# Patient Record
Sex: Male | Born: 1940
Health system: Southern US, Community
[De-identification: ages and names within clinical notes are randomized; demographics above are authoritative.]

## PROBLEM LIST (undated history)

## (undated) DIAGNOSIS — Z87442 Personal history of urinary calculi: Secondary | ICD-10-CM

## (undated) DIAGNOSIS — I2510 Atherosclerotic heart disease of native coronary artery without angina pectoris: Secondary | ICD-10-CM

## (undated) DIAGNOSIS — H269 Unspecified cataract: Secondary | ICD-10-CM

## (undated) DIAGNOSIS — I10 Essential (primary) hypertension: Secondary | ICD-10-CM

## (undated) DIAGNOSIS — R251 Tremor, unspecified: Secondary | ICD-10-CM

## (undated) DIAGNOSIS — F419 Anxiety disorder, unspecified: Secondary | ICD-10-CM

## (undated) DIAGNOSIS — Z950 Presence of cardiac pacemaker: Secondary | ICD-10-CM

## (undated) DIAGNOSIS — G8929 Other chronic pain: Secondary | ICD-10-CM

## (undated) DIAGNOSIS — G20 Parkinson's disease: Secondary | ICD-10-CM

## (undated) HISTORY — PX: OTHER SURGICAL HISTORY: SHX169

## (undated) HISTORY — DX: Parkinson's disease: G20

## (undated) HISTORY — DX: Tremor, unspecified: R25.1

## (undated) HISTORY — DX: Unspecified cataract: H26.9

## (undated) HISTORY — PX: APPENDECTOMY: SHX54

## (undated) HISTORY — PX: TONSILLECTOMY: SUR1361

---
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
## 1994-12-19 HISTORY — PX: OTHER SURGICAL HISTORY: SHX169

## 2005-01-27 ENCOUNTER — Ambulatory Visit: Payer: Self-pay | Admitting: Cardiology

## 2005-02-04 ENCOUNTER — Ambulatory Visit: Payer: Self-pay | Admitting: Cardiology

## 2005-02-07 ENCOUNTER — Ambulatory Visit: Payer: Self-pay

## 2005-12-19 HISTORY — PX: STENT PLACEMENT VASCULAR (ARMC HX): HXRAD1737

## 2008-08-29 ENCOUNTER — Encounter: Payer: Self-pay | Admitting: Family Medicine

## 2008-12-05 ENCOUNTER — Encounter: Payer: Self-pay | Admitting: Family Medicine

## 2009-04-01 ENCOUNTER — Encounter: Payer: Self-pay | Admitting: Family Medicine

## 2010-08-25 ENCOUNTER — Encounter: Payer: Self-pay | Admitting: Family Medicine

## 2010-09-01 ENCOUNTER — Encounter: Payer: Self-pay | Admitting: Family Medicine

## 2011-07-23 ENCOUNTER — Encounter: Payer: Self-pay | Admitting: Family Medicine

## 2012-03-16 ENCOUNTER — Encounter: Payer: Self-pay | Admitting: Family Medicine

## 2012-03-29 ENCOUNTER — Encounter: Payer: Self-pay | Admitting: Family Medicine

## 2013-02-11 ENCOUNTER — Encounter: Payer: Self-pay | Admitting: Family Medicine

## 2013-02-25 ENCOUNTER — Encounter: Payer: Self-pay | Admitting: Family Medicine

## 2013-10-07 ENCOUNTER — Encounter: Payer: Self-pay | Admitting: Family Medicine

## 2014-11-19 ENCOUNTER — Encounter: Payer: Self-pay | Admitting: Family Medicine

## 2014-11-25 ENCOUNTER — Encounter: Payer: Self-pay | Admitting: Family Medicine

## 2015-06-01 ENCOUNTER — Encounter: Payer: Self-pay | Admitting: Family Medicine

## 2016-11-18 HISTORY — PX: PACEMAKER INSERTION: SHX728

## 2017-01-13 DIAGNOSIS — R001 Bradycardia, unspecified: Secondary | ICD-10-CM | POA: Diagnosis not present

## 2017-01-13 DIAGNOSIS — I2510 Atherosclerotic heart disease of native coronary artery without angina pectoris: Secondary | ICD-10-CM | POA: Diagnosis not present

## 2017-01-13 DIAGNOSIS — I10 Essential (primary) hypertension: Secondary | ICD-10-CM | POA: Diagnosis not present

## 2017-01-13 DIAGNOSIS — E785 Hyperlipidemia, unspecified: Secondary | ICD-10-CM | POA: Diagnosis not present

## 2017-03-27 DIAGNOSIS — I495 Sick sinus syndrome: Secondary | ICD-10-CM | POA: Diagnosis not present

## 2017-04-06 DIAGNOSIS — M5441 Lumbago with sciatica, right side: Secondary | ICD-10-CM | POA: Diagnosis not present

## 2017-04-06 DIAGNOSIS — M9904 Segmental and somatic dysfunction of sacral region: Secondary | ICD-10-CM | POA: Diagnosis not present

## 2017-04-06 DIAGNOSIS — M9903 Segmental and somatic dysfunction of lumbar region: Secondary | ICD-10-CM | POA: Diagnosis not present

## 2017-04-06 DIAGNOSIS — M545 Low back pain: Secondary | ICD-10-CM | POA: Diagnosis not present

## 2017-04-13 DIAGNOSIS — M545 Low back pain: Secondary | ICD-10-CM | POA: Diagnosis not present

## 2017-04-13 DIAGNOSIS — M9904 Segmental and somatic dysfunction of sacral region: Secondary | ICD-10-CM | POA: Diagnosis not present

## 2017-04-13 DIAGNOSIS — M5441 Lumbago with sciatica, right side: Secondary | ICD-10-CM | POA: Diagnosis not present

## 2017-04-13 DIAGNOSIS — M9903 Segmental and somatic dysfunction of lumbar region: Secondary | ICD-10-CM | POA: Diagnosis not present

## 2017-04-17 DIAGNOSIS — M5441 Lumbago with sciatica, right side: Secondary | ICD-10-CM | POA: Diagnosis not present

## 2017-04-17 DIAGNOSIS — M545 Low back pain: Secondary | ICD-10-CM | POA: Diagnosis not present

## 2017-04-17 DIAGNOSIS — M9903 Segmental and somatic dysfunction of lumbar region: Secondary | ICD-10-CM | POA: Diagnosis not present

## 2017-04-17 DIAGNOSIS — M9904 Segmental and somatic dysfunction of sacral region: Secondary | ICD-10-CM | POA: Diagnosis not present

## 2017-04-24 DIAGNOSIS — M5441 Lumbago with sciatica, right side: Secondary | ICD-10-CM | POA: Diagnosis not present

## 2017-04-24 DIAGNOSIS — M9904 Segmental and somatic dysfunction of sacral region: Secondary | ICD-10-CM | POA: Diagnosis not present

## 2017-04-24 DIAGNOSIS — M9903 Segmental and somatic dysfunction of lumbar region: Secondary | ICD-10-CM | POA: Diagnosis not present

## 2017-04-24 DIAGNOSIS — M545 Low back pain: Secondary | ICD-10-CM | POA: Diagnosis not present

## 2017-05-08 DIAGNOSIS — M5441 Lumbago with sciatica, right side: Secondary | ICD-10-CM | POA: Diagnosis not present

## 2017-05-08 DIAGNOSIS — M9903 Segmental and somatic dysfunction of lumbar region: Secondary | ICD-10-CM | POA: Diagnosis not present

## 2017-05-08 DIAGNOSIS — M545 Low back pain: Secondary | ICD-10-CM | POA: Diagnosis not present

## 2017-05-08 DIAGNOSIS — M9904 Segmental and somatic dysfunction of sacral region: Secondary | ICD-10-CM | POA: Diagnosis not present

## 2017-05-10 DIAGNOSIS — E782 Mixed hyperlipidemia: Secondary | ICD-10-CM | POA: Diagnosis not present

## 2017-05-10 DIAGNOSIS — Z125 Encounter for screening for malignant neoplasm of prostate: Secondary | ICD-10-CM | POA: Diagnosis not present

## 2017-05-10 DIAGNOSIS — Z79899 Other long term (current) drug therapy: Secondary | ICD-10-CM | POA: Diagnosis not present

## 2017-05-16 DIAGNOSIS — M545 Low back pain: Secondary | ICD-10-CM | POA: Diagnosis not present

## 2017-05-16 DIAGNOSIS — M5441 Lumbago with sciatica, right side: Secondary | ICD-10-CM | POA: Diagnosis not present

## 2017-05-16 DIAGNOSIS — M9904 Segmental and somatic dysfunction of sacral region: Secondary | ICD-10-CM | POA: Diagnosis not present

## 2017-05-16 DIAGNOSIS — M9903 Segmental and somatic dysfunction of lumbar region: Secondary | ICD-10-CM | POA: Diagnosis not present

## 2017-05-18 DIAGNOSIS — Z0001 Encounter for general adult medical examination with abnormal findings: Secondary | ICD-10-CM | POA: Diagnosis not present

## 2017-05-18 DIAGNOSIS — F419 Anxiety disorder, unspecified: Secondary | ICD-10-CM | POA: Diagnosis not present

## 2017-05-18 DIAGNOSIS — E782 Mixed hyperlipidemia: Secondary | ICD-10-CM | POA: Diagnosis not present

## 2017-05-18 DIAGNOSIS — I10 Essential (primary) hypertension: Secondary | ICD-10-CM | POA: Diagnosis not present

## 2017-05-22 DIAGNOSIS — M25331 Other instability, right wrist: Secondary | ICD-10-CM | POA: Diagnosis not present

## 2017-05-22 DIAGNOSIS — I2510 Atherosclerotic heart disease of native coronary artery without angina pectoris: Secondary | ICD-10-CM | POA: Diagnosis not present

## 2017-05-22 DIAGNOSIS — I10 Essential (primary) hypertension: Secondary | ICD-10-CM | POA: Diagnosis not present

## 2017-05-22 DIAGNOSIS — R5383 Other fatigue: Secondary | ICD-10-CM | POA: Diagnosis not present

## 2017-05-22 DIAGNOSIS — M25531 Pain in right wrist: Secondary | ICD-10-CM | POA: Diagnosis not present

## 2017-05-22 DIAGNOSIS — R001 Bradycardia, unspecified: Secondary | ICD-10-CM | POA: Diagnosis not present

## 2017-05-22 DIAGNOSIS — E785 Hyperlipidemia, unspecified: Secondary | ICD-10-CM | POA: Diagnosis not present

## 2017-05-30 DIAGNOSIS — M5441 Lumbago with sciatica, right side: Secondary | ICD-10-CM | POA: Diagnosis not present

## 2017-05-30 DIAGNOSIS — M9903 Segmental and somatic dysfunction of lumbar region: Secondary | ICD-10-CM | POA: Diagnosis not present

## 2017-05-30 DIAGNOSIS — M545 Low back pain: Secondary | ICD-10-CM | POA: Diagnosis not present

## 2017-05-30 DIAGNOSIS — M9904 Segmental and somatic dysfunction of sacral region: Secondary | ICD-10-CM | POA: Diagnosis not present

## 2017-06-06 DIAGNOSIS — M545 Low back pain: Secondary | ICD-10-CM | POA: Diagnosis not present

## 2017-06-06 DIAGNOSIS — M5441 Lumbago with sciatica, right side: Secondary | ICD-10-CM | POA: Diagnosis not present

## 2017-06-06 DIAGNOSIS — M9903 Segmental and somatic dysfunction of lumbar region: Secondary | ICD-10-CM | POA: Diagnosis not present

## 2017-06-06 DIAGNOSIS — M9904 Segmental and somatic dysfunction of sacral region: Secondary | ICD-10-CM | POA: Diagnosis not present

## 2017-06-27 DIAGNOSIS — M545 Low back pain: Secondary | ICD-10-CM | POA: Diagnosis not present

## 2017-06-27 DIAGNOSIS — R251 Tremor, unspecified: Secondary | ICD-10-CM | POA: Diagnosis not present

## 2017-06-28 DIAGNOSIS — M47816 Spondylosis without myelopathy or radiculopathy, lumbar region: Secondary | ICD-10-CM | POA: Diagnosis not present

## 2017-06-28 DIAGNOSIS — M4316 Spondylolisthesis, lumbar region: Secondary | ICD-10-CM | POA: Diagnosis not present

## 2017-06-28 DIAGNOSIS — I495 Sick sinus syndrome: Secondary | ICD-10-CM | POA: Diagnosis not present

## 2017-06-28 DIAGNOSIS — M545 Low back pain: Secondary | ICD-10-CM | POA: Diagnosis not present

## 2017-07-03 DIAGNOSIS — M9904 Segmental and somatic dysfunction of sacral region: Secondary | ICD-10-CM | POA: Diagnosis not present

## 2017-07-03 DIAGNOSIS — M9903 Segmental and somatic dysfunction of lumbar region: Secondary | ICD-10-CM | POA: Diagnosis not present

## 2017-07-03 DIAGNOSIS — M5441 Lumbago with sciatica, right side: Secondary | ICD-10-CM | POA: Diagnosis not present

## 2017-07-03 DIAGNOSIS — M545 Low back pain: Secondary | ICD-10-CM | POA: Diagnosis not present

## 2017-07-14 DIAGNOSIS — H43393 Other vitreous opacities, bilateral: Secondary | ICD-10-CM | POA: Diagnosis not present

## 2017-07-14 DIAGNOSIS — H524 Presbyopia: Secondary | ICD-10-CM | POA: Diagnosis not present

## 2017-07-17 DIAGNOSIS — M545 Low back pain: Secondary | ICD-10-CM | POA: Diagnosis not present

## 2017-07-17 DIAGNOSIS — M2560 Stiffness of unspecified joint, not elsewhere classified: Secondary | ICD-10-CM | POA: Diagnosis not present

## 2017-07-19 DIAGNOSIS — M545 Low back pain: Secondary | ICD-10-CM | POA: Diagnosis not present

## 2017-07-19 DIAGNOSIS — M2560 Stiffness of unspecified joint, not elsewhere classified: Secondary | ICD-10-CM | POA: Diagnosis not present

## 2017-07-24 DIAGNOSIS — M545 Low back pain: Secondary | ICD-10-CM | POA: Diagnosis not present

## 2017-07-24 DIAGNOSIS — M2560 Stiffness of unspecified joint, not elsewhere classified: Secondary | ICD-10-CM | POA: Diagnosis not present

## 2017-07-25 DIAGNOSIS — R251 Tremor, unspecified: Secondary | ICD-10-CM | POA: Diagnosis not present

## 2017-07-26 DIAGNOSIS — M2560 Stiffness of unspecified joint, not elsewhere classified: Secondary | ICD-10-CM | POA: Diagnosis not present

## 2017-07-26 DIAGNOSIS — M545 Low back pain: Secondary | ICD-10-CM | POA: Diagnosis not present

## 2017-08-01 DIAGNOSIS — M545 Low back pain: Secondary | ICD-10-CM | POA: Diagnosis not present

## 2017-08-01 DIAGNOSIS — M2560 Stiffness of unspecified joint, not elsewhere classified: Secondary | ICD-10-CM | POA: Diagnosis not present

## 2017-08-03 DIAGNOSIS — M2560 Stiffness of unspecified joint, not elsewhere classified: Secondary | ICD-10-CM | POA: Diagnosis not present

## 2017-08-03 DIAGNOSIS — M545 Low back pain: Secondary | ICD-10-CM | POA: Diagnosis not present

## 2017-08-11 DIAGNOSIS — R54 Age-related physical debility: Secondary | ICD-10-CM | POA: Diagnosis not present

## 2017-08-11 DIAGNOSIS — G250 Essential tremor: Secondary | ICD-10-CM | POA: Diagnosis not present

## 2017-08-11 DIAGNOSIS — R251 Tremor, unspecified: Secondary | ICD-10-CM | POA: Diagnosis not present

## 2017-08-11 DIAGNOSIS — E669 Obesity, unspecified: Secondary | ICD-10-CM | POA: Diagnosis not present

## 2017-08-29 DIAGNOSIS — M9903 Segmental and somatic dysfunction of lumbar region: Secondary | ICD-10-CM | POA: Diagnosis not present

## 2017-08-29 DIAGNOSIS — M461 Sacroiliitis, not elsewhere classified: Secondary | ICD-10-CM | POA: Diagnosis not present

## 2017-08-29 DIAGNOSIS — M5441 Lumbago with sciatica, right side: Secondary | ICD-10-CM | POA: Diagnosis not present

## 2017-08-29 DIAGNOSIS — M9904 Segmental and somatic dysfunction of sacral region: Secondary | ICD-10-CM | POA: Diagnosis not present

## 2017-09-14 DIAGNOSIS — M2560 Stiffness of unspecified joint, not elsewhere classified: Secondary | ICD-10-CM | POA: Diagnosis not present

## 2017-09-14 DIAGNOSIS — M545 Low back pain: Secondary | ICD-10-CM | POA: Diagnosis not present

## 2017-09-18 DIAGNOSIS — G20 Parkinson's disease: Secondary | ICD-10-CM | POA: Diagnosis not present

## 2017-09-18 DIAGNOSIS — G250 Essential tremor: Secondary | ICD-10-CM | POA: Diagnosis not present

## 2017-09-18 DIAGNOSIS — R54 Age-related physical debility: Secondary | ICD-10-CM | POA: Diagnosis not present

## 2017-09-19 DIAGNOSIS — M2560 Stiffness of unspecified joint, not elsewhere classified: Secondary | ICD-10-CM | POA: Diagnosis not present

## 2017-09-19 DIAGNOSIS — M545 Low back pain: Secondary | ICD-10-CM | POA: Diagnosis not present

## 2017-09-21 DIAGNOSIS — M545 Low back pain: Secondary | ICD-10-CM | POA: Diagnosis not present

## 2017-09-21 DIAGNOSIS — M2560 Stiffness of unspecified joint, not elsewhere classified: Secondary | ICD-10-CM | POA: Diagnosis not present

## 2017-09-26 DIAGNOSIS — M545 Low back pain: Secondary | ICD-10-CM | POA: Diagnosis not present

## 2017-09-26 DIAGNOSIS — M2560 Stiffness of unspecified joint, not elsewhere classified: Secondary | ICD-10-CM | POA: Diagnosis not present

## 2017-09-27 DIAGNOSIS — I495 Sick sinus syndrome: Secondary | ICD-10-CM | POA: Diagnosis not present

## 2017-09-28 DIAGNOSIS — M545 Low back pain: Secondary | ICD-10-CM | POA: Diagnosis not present

## 2017-09-28 DIAGNOSIS — M2560 Stiffness of unspecified joint, not elsewhere classified: Secondary | ICD-10-CM | POA: Diagnosis not present

## 2017-10-03 DIAGNOSIS — M2560 Stiffness of unspecified joint, not elsewhere classified: Secondary | ICD-10-CM | POA: Diagnosis not present

## 2017-10-03 DIAGNOSIS — M545 Low back pain: Secondary | ICD-10-CM | POA: Diagnosis not present

## 2017-10-25 DIAGNOSIS — M461 Sacroiliitis, not elsewhere classified: Secondary | ICD-10-CM | POA: Diagnosis not present

## 2017-10-25 DIAGNOSIS — M9904 Segmental and somatic dysfunction of sacral region: Secondary | ICD-10-CM | POA: Diagnosis not present

## 2017-10-25 DIAGNOSIS — M5441 Lumbago with sciatica, right side: Secondary | ICD-10-CM | POA: Diagnosis not present

## 2017-10-25 DIAGNOSIS — M9903 Segmental and somatic dysfunction of lumbar region: Secondary | ICD-10-CM | POA: Diagnosis not present

## 2017-10-31 DIAGNOSIS — M5441 Lumbago with sciatica, right side: Secondary | ICD-10-CM | POA: Diagnosis not present

## 2017-10-31 DIAGNOSIS — M9903 Segmental and somatic dysfunction of lumbar region: Secondary | ICD-10-CM | POA: Diagnosis not present

## 2017-10-31 DIAGNOSIS — M461 Sacroiliitis, not elsewhere classified: Secondary | ICD-10-CM | POA: Diagnosis not present

## 2017-10-31 DIAGNOSIS — M9904 Segmental and somatic dysfunction of sacral region: Secondary | ICD-10-CM | POA: Diagnosis not present

## 2017-11-14 DIAGNOSIS — S20211A Contusion of right front wall of thorax, initial encounter: Secondary | ICD-10-CM | POA: Diagnosis not present

## 2017-11-14 DIAGNOSIS — I2510 Atherosclerotic heart disease of native coronary artery without angina pectoris: Secondary | ICD-10-CM | POA: Diagnosis not present

## 2017-11-14 DIAGNOSIS — Z950 Presence of cardiac pacemaker: Secondary | ICD-10-CM | POA: Diagnosis not present

## 2017-11-14 DIAGNOSIS — I10 Essential (primary) hypertension: Secondary | ICD-10-CM | POA: Diagnosis not present

## 2017-11-14 DIAGNOSIS — Z7982 Long term (current) use of aspirin: Secondary | ICD-10-CM | POA: Diagnosis not present

## 2017-11-14 DIAGNOSIS — Z9861 Coronary angioplasty status: Secondary | ICD-10-CM | POA: Diagnosis not present

## 2017-11-14 DIAGNOSIS — S299XXA Unspecified injury of thorax, initial encounter: Secondary | ICD-10-CM | POA: Diagnosis not present

## 2017-11-14 DIAGNOSIS — I252 Old myocardial infarction: Secondary | ICD-10-CM | POA: Diagnosis not present

## 2017-12-05 ENCOUNTER — Ambulatory Visit (INDEPENDENT_AMBULATORY_CARE_PROVIDER_SITE_OTHER): Payer: Medicare Other | Admitting: Family Medicine

## 2017-12-05 ENCOUNTER — Encounter: Payer: Self-pay | Admitting: Family Medicine

## 2017-12-05 ENCOUNTER — Telehealth: Payer: Self-pay | Admitting: Neurology

## 2017-12-05 VITALS — BP 137/89 | HR 60 | Temp 96.9°F | Ht 71.0 in | Wt 202.0 lb

## 2017-12-05 DIAGNOSIS — H269 Unspecified cataract: Secondary | ICD-10-CM | POA: Insufficient documentation

## 2017-12-05 DIAGNOSIS — H6123 Impacted cerumen, bilateral: Secondary | ICD-10-CM

## 2017-12-05 DIAGNOSIS — Z0000 Encounter for general adult medical examination without abnormal findings: Secondary | ICD-10-CM | POA: Diagnosis not present

## 2017-12-05 DIAGNOSIS — R251 Tremor, unspecified: Secondary | ICD-10-CM | POA: Diagnosis not present

## 2017-12-05 DIAGNOSIS — K449 Diaphragmatic hernia without obstruction or gangrene: Secondary | ICD-10-CM

## 2017-12-05 DIAGNOSIS — G20 Parkinson's disease: Secondary | ICD-10-CM | POA: Diagnosis not present

## 2017-12-05 DIAGNOSIS — R899 Unspecified abnormal finding in specimens from other organs, systems and tissues: Secondary | ICD-10-CM | POA: Diagnosis not present

## 2017-12-05 DIAGNOSIS — I2510 Atherosclerotic heart disease of native coronary artery without angina pectoris: Secondary | ICD-10-CM | POA: Diagnosis not present

## 2017-12-05 DIAGNOSIS — Z950 Presence of cardiac pacemaker: Secondary | ICD-10-CM | POA: Diagnosis not present

## 2017-12-05 DIAGNOSIS — G20C Parkinsonism, unspecified: Secondary | ICD-10-CM

## 2017-12-05 DIAGNOSIS — Z1211 Encounter for screening for malignant neoplasm of colon: Secondary | ICD-10-CM

## 2017-12-05 DIAGNOSIS — R6889 Other general symptoms and signs: Secondary | ICD-10-CM | POA: Diagnosis not present

## 2017-12-05 LAB — URINALYSIS, COMPLETE
BILIRUBIN UA: NEGATIVE
GLUCOSE, UA: NEGATIVE
Ketones, UA: NEGATIVE
LEUKOCYTES UA: NEGATIVE
Nitrite, UA: NEGATIVE
PH UA: 7 (ref 5.0–7.5)
PROTEIN UA: NEGATIVE
RBC, UA: NEGATIVE
Specific Gravity, UA: 1.02 (ref 1.005–1.030)
UUROB: 1 mg/dL (ref 0.2–1.0)

## 2017-12-05 LAB — MICROSCOPIC EXAMINATION
BACTERIA UA: NONE SEEN
Epithelial Cells (non renal): NONE SEEN /hpf (ref 0–10)
Renal Epithel, UA: NONE SEEN /hpf
WBC UA: NONE SEEN /HPF (ref 0–?)

## 2017-12-05 MED ORDER — CARBIDOPA-LEVODOPA 10-100 MG PO TABS: 2.0000 | ORAL_TABLET | Freq: Two times a day (BID) | ORAL | 1 refills | Status: DC

## 2017-12-05 MED ORDER — PRIMIDONE 50 MG PO TABS: 50.0000 mg | ORAL_TABLET | Freq: Every day | ORAL | 1 refills | Status: DC

## 2017-12-05 NOTE — Telephone Encounter (Signed)
I discussed Dr. Laurance Flatten, he has been referred here for evaluation of Parkinson's disease.  He has recently moved to this area from the [place], [place] area.

## 2017-12-05 NOTE — Patient Instructions (Signed)
We will make a referral to cardiology for follow-up in a patient who has had stent placement and has had a pacemaker We will also make an appointment with neurology to get established for treating what could be Parkinson's disease. We will get baseline blood work today We will asked that we get a release of records from his cardiologist and neurologist in [place]

## 2017-12-05 NOTE — Progress Notes (Signed)
Subjective:    Patient ID: Marcus Fuller, male    DOB: 01-21-1941, 76 y.o.   MRN: [id number]  HPI Patient here today to establish care. He recently moved back to [place] from [place].  The patient comes to the office today to reestablish care and that they moved back from [place] back to the community.  While he was away he did have cardiac events requiring stent and pacemaker placement and he wants to get set up with a cardiologist.  He also has developed a tremor and he is tried medicine for Parkinson's but this did not help and they are thinking it may just be a familial tremor.  He also has never had a colonoscopy.  There is no family history of colon cancer.  He recently had a younger brother that died of pancreatitis.  The patient currently denies any chest pain or shortness of breath.  He denies any trouble with swallowing heartburn indigestion nausea vomiting diarrhea blood in the stool or black tarry bowel movements.  He is passing his water without problems.  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  He is currently taking medicine for his Parkinson's and is not sure if this is helping a lot.  In [place] he was followed by a neurologist and a cardiologist.  Patient is currently taking Sinemet IR 10 100 twice daily and he is taking 2 tablets twice daily.    Patient Active Problem List   Diagnosis Date Noted  . Cataract    Outpatient Encounter Medications as of 12/05/2017  Medication Sig  . carbidopa-levodopa (SINEMET IR) 10-100 MG tablet Take 2 tablets by mouth 2 (two) times daily.  Marland Kitchen ALPRAZolam (XANAX) 0.25 MG tablet Take 0.25 mg by mouth 2 (two) times daily as needed for anxiety.  . primidone (MYSOLINE) 50 MG tablet Take by mouth at bedtime.   No facility-administered encounter medications on file as of 12/05/2017.       Review of Systems  Constitutional: Negative.     HENT: Negative.   Eyes: Negative.   Respiratory: Negative.   Cardiovascular: Negative.   Gastrointestinal: Negative.   Endocrine: Negative.   Genitourinary: Negative.   Musculoskeletal: Negative.   Skin: Negative.   Allergic/Immunologic: Negative.   Neurological: Negative.   Hematological: Negative.   Psychiatric/Behavioral: Negative.        Objective:   Physical Exam  Constitutional: He is oriented to person, place, and time. He appears well-developed and well-nourished.  The patient is pleasant and alert and has a noticeable bilateral tremor with the right being worse than the left upper extremity.  HENT:  Head: Normocephalic and atraumatic.  Nose: Nose normal.  Mouth/Throat: Oropharynx is clear and moist. No oropharyngeal exudate.  Bilateral ear cerumen  Eyes: Conjunctivae and EOM are normal. Pupils are equal, round, and reactive to light. Right eye exhibits no discharge. Left eye exhibits no discharge. No scleral icterus.  Neck: Normal range of motion. Neck supple. No thyromegaly present.  No bruits thyromegaly or anterior cervical adenopathy  Cardiovascular: Normal rate, normal heart sounds and intact distal pulses.  No murmur heard. Heart is slightly irregular at 72/min  Pulmonary/Chest: Effort normal and breath sounds normal. No respiratory distress. He has no wheezes. He has no rales. He exhibits no tenderness.  Clear anteriorly and posteriorly and no axillary adenopathy  Abdominal: Soft. Bowel sounds are normal. He exhibits no mass. There  is no tenderness. There is no rebound and no guarding.  No abdominal tenderness masses bruits or organ enlargement and no inguinal adenopathy  Musculoskeletal: Normal range of motion. He exhibits no edema.  Lymphadenopathy:    He has no cervical adenopathy.  Neurological: He is alert and oriented to person, place, and time. He has normal reflexes. No cranial nerve deficit.  Pill rolling tremor in both hands most likely Parkinson's.   Skin: Skin is warm and dry. No rash noted.  Psychiatric: He has a normal mood and affect. His behavior is normal. Judgment and thought content normal.  Nursing note and vitals reviewed.  BP 137/89 (BP Location: Left Arm)   Pulse 60   Temp (!) 96.9 F (36.1 C) (Oral)   Ht '5\' 11"'  (1.803 m)   Wt 202 lb (91.6 kg)   BMI 28.17 kg/m         Assessment & Plan:  1. Tremor -The patient will continue with his primidone and Sinemet since this is what he was put on by the neurologist in [place] and we will let the neurologist that we refer him to make any adjustments to this as he sees fit. - CBC with Differential/Platelet - Thyroid Panel With TSH - Vitamin B12 - Ambulatory referral to Neurology  2. Primary Parkinsonism ([place]) -Refer to neurology and stay on medicines that he has been taking until then - CBC with Differential/Platelet - Ambulatory referral to Neurology  3. ASCVD (arteriosclerotic cardiovascular disease) -Referral to cardiology for ASCVD and pacemaker monitoring - BMP8+EGFR - CBC with Differential/Platelet - Hepatic function panel - Lipid panel - Ambulatory referral to Cardiology  4. Pacemaker -Refer to cardiology for regular follow-up - CBC with Differential/Platelet - Ambulatory referral to Cardiology  5. Screen for colon cancer -Patient given FOBT to return -We will get lab work back in FOBT back and then possible referral for colonoscopy - Ambulatory referral to Gastroenterology  6. Health care maintenance - BMP8+EGFR - CBC with Differential/Platelet - Hepatic function panel - VITAMIN D 25 Hydroxy (Vit-D Deficiency, Fractures) - Lipid panel - Thyroid Panel With TSH - Vitamin B12 - Urinalysis, Complete - Ambulatory referral to Cardiology - Ambulatory referral to Neurology - Ambulatory referral to Gastroenterology  7.  Bilateral ear cerumen -Ear irrigation to remove wax  8.  Hiatal hernia  No orders of the defined types were placed in this  encounter.  Patient Instructions  We will make a referral to cardiology for follow-up in a patient who has had stent placement and has had a pacemaker We will also make an appointment with neurology to get established for treating what could be Parkinson's disease. We will get baseline blood work today We will asked that we get a release of records from his cardiologist and neurologist in [facility]  Arrie Senate MD

## 2017-12-05 NOTE — Addendum Note (Signed)
Addended by: Zannie Cove on: 12/05/2017 04:35 PM   Modules accepted: Orders

## 2017-12-06 LAB — LIPID PANEL
CHOL/HDL RATIO: 5.9 ratio — AB (ref 0.0–5.0)
Cholesterol, Total: 190 mg/dL (ref 100–199)
HDL: 32 mg/dL — AB (ref >39)
LDL Calculated: 118 mg/dL — ABNORMAL HIGH (ref 0–99)
Triglycerides: 201 mg/dL — ABNORMAL HIGH (ref 0–149)
VLDL CHOLESTEROL CAL: 40 mg/dL (ref 5–40)

## 2017-12-06 LAB — HEPATIC FUNCTION PANEL
ALK PHOS: 94 IU/L (ref 39–117)
ALT: 25 IU/L (ref 0–44)
AST: 21 IU/L (ref 0–40)
Albumin: 4.6 g/dL (ref 3.5–4.8)
BILIRUBIN TOTAL: 0.7 mg/dL (ref 0.0–1.2)
BILIRUBIN, DIRECT: 0.21 mg/dL (ref 0.00–0.40)
Total Protein: 6.9 g/dL (ref 6.0–8.5)

## 2017-12-06 LAB — CBC WITH DIFFERENTIAL/PLATELET
BASOS: 1 %
Basophils Absolute: 0 10*3/uL (ref 0.0–0.2)
EOS (ABSOLUTE): 0.2 10*3/uL (ref 0.0–0.4)
EOS: 2 %
HEMOGLOBIN: 16 g/dL (ref 13.0–17.7)
Hematocrit: 47.3 % (ref 37.5–51.0)
IMMATURE GRANS (ABS): 0 10*3/uL (ref 0.0–0.1)
IMMATURE GRANULOCYTES: 0 %
LYMPHS: 23 %
Lymphocytes Absolute: 2 10*3/uL (ref 0.7–3.1)
MCH: 28.9 pg (ref 26.6–33.0)
MCHC: 33.8 g/dL (ref 31.5–35.7)
MCV: 85 fL (ref 79–97)
MONOCYTES: 7 %
Monocytes Absolute: 0.6 10*3/uL (ref 0.1–0.9)
NEUTROS ABS: 5.6 10*3/uL (ref 1.4–7.0)
NEUTROS PCT: 67 %
PLATELETS: 197 10*3/uL (ref 150–379)
RBC: 5.54 x10E6/uL (ref 4.14–5.80)
RDW: 13.6 % (ref 12.3–15.4)
WBC: 8.4 10*3/uL (ref 3.4–10.8)

## 2017-12-06 LAB — THYROID PANEL WITH TSH
FREE THYROXINE INDEX: 2 (ref 1.2–4.9)
T3 Uptake Ratio: 26 % (ref 24–39)
T4 TOTAL: 7.5 ug/dL (ref 4.5–12.0)
TSH: 4.25 u[IU]/mL (ref 0.450–4.500)

## 2017-12-06 LAB — BMP8+EGFR
BUN/Creatinine Ratio: 14 (ref 10–24)
BUN: 14 mg/dL (ref 8–27)
CALCIUM: 10.3 mg/dL — AB (ref 8.6–10.2)
CO2: 24 mmol/L (ref 20–29)
CREATININE: 0.98 mg/dL (ref 0.76–1.27)
Chloride: 102 mmol/L (ref 96–106)
GFR, EST AFRICAN AMERICAN: 87 mL/min/{1.73_m2} (ref >59)
GFR, EST NON AFRICAN AMERICAN: 75 mL/min/{1.73_m2} (ref >59)
Glucose: 84 mg/dL (ref 65–99)
Potassium: 4.1 mmol/L (ref 3.5–5.2)
Sodium: 143 mmol/L (ref 134–144)

## 2017-12-06 LAB — VITAMIN D 25 HYDROXY (VIT D DEFICIENCY, FRACTURES): VIT D 25 HYDROXY: 20.4 ng/mL — AB (ref 30.0–100.0)

## 2017-12-06 LAB — VITAMIN B12: VITAMIN B 12: 571 pg/mL (ref 232–1245)

## 2017-12-06 MED ORDER — VITAMIN D (ERGOCALCIFEROL) 1.25 MG (50000 UNIT) PO CAPS: 50000.0000 [IU] | ORAL_CAPSULE | ORAL | 12 refills | Status: DC

## 2017-12-06 NOTE — Addendum Note (Signed)
Addended by: Nigel Berthold C on: 12/06/2017 11:48 AM   Modules accepted: Orders

## 2017-12-25 ENCOUNTER — Ambulatory Visit: Payer: Self-pay | Admitting: Internal Medicine

## 2017-12-27 DIAGNOSIS — I495 Sick sinus syndrome: Secondary | ICD-10-CM | POA: Diagnosis not present

## 2018-01-02 ENCOUNTER — Telehealth: Payer: Self-pay | Admitting: Family Medicine

## 2018-01-02 NOTE — Telephone Encounter (Signed)
appt made

## 2018-01-03 ENCOUNTER — Ambulatory Visit (INDEPENDENT_AMBULATORY_CARE_PROVIDER_SITE_OTHER): Payer: Medicare Other | Admitting: Family Medicine

## 2018-01-03 ENCOUNTER — Encounter: Payer: Self-pay | Admitting: Family Medicine

## 2018-01-03 ENCOUNTER — Ambulatory Visit (INDEPENDENT_AMBULATORY_CARE_PROVIDER_SITE_OTHER): Payer: Medicare Other

## 2018-01-03 VITALS — BP 171/92 | HR 58 | Temp 96.8°F | Ht 71.0 in | Wt 202.0 lb

## 2018-01-03 DIAGNOSIS — M25421 Effusion, right elbow: Secondary | ICD-10-CM | POA: Diagnosis not present

## 2018-01-03 DIAGNOSIS — I10 Essential (primary) hypertension: Secondary | ICD-10-CM | POA: Diagnosis not present

## 2018-01-03 IMAGING — DX DG ELBOW 2V*R*
2 series · 2 of 2 positions shown · non-contrast
Comparison: No prior.

CLINICAL DATA: Fall.  Continued pain.

EXAM:
RIGHT ELBOW - 2 VIEW

[elbow ap]
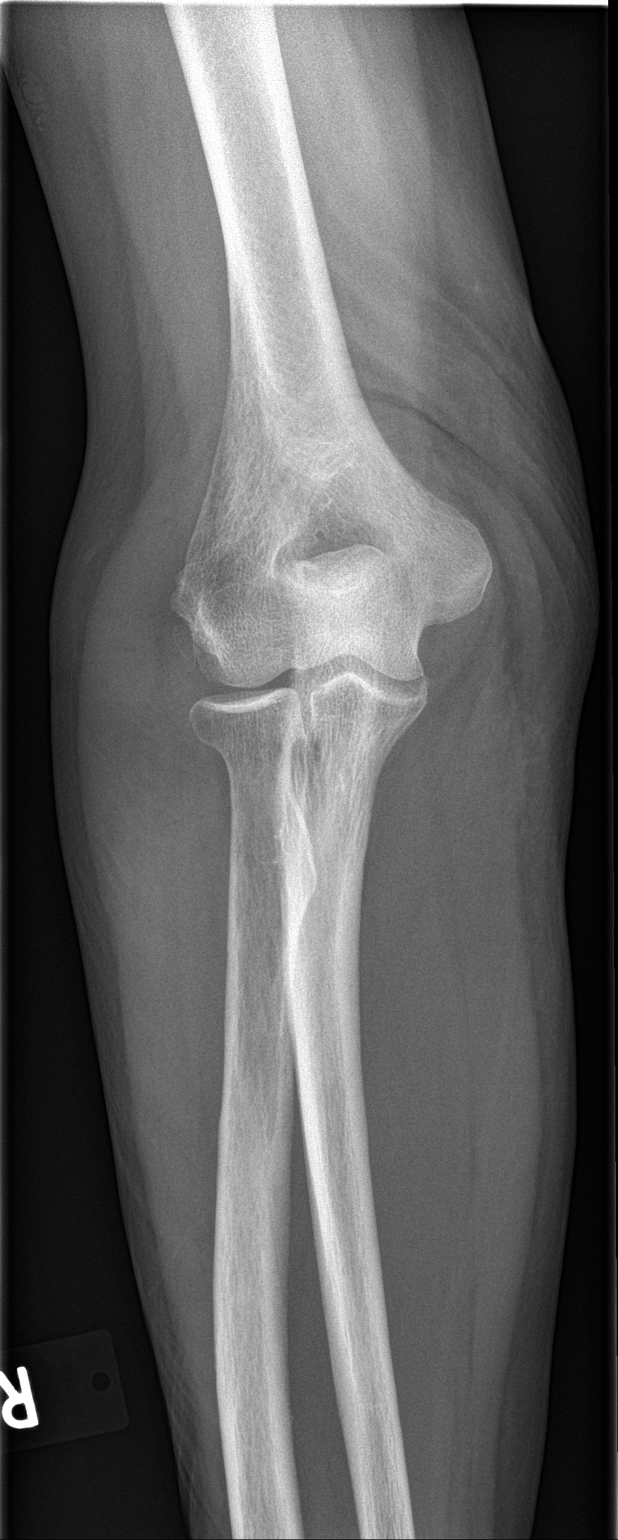

[elbow lat]
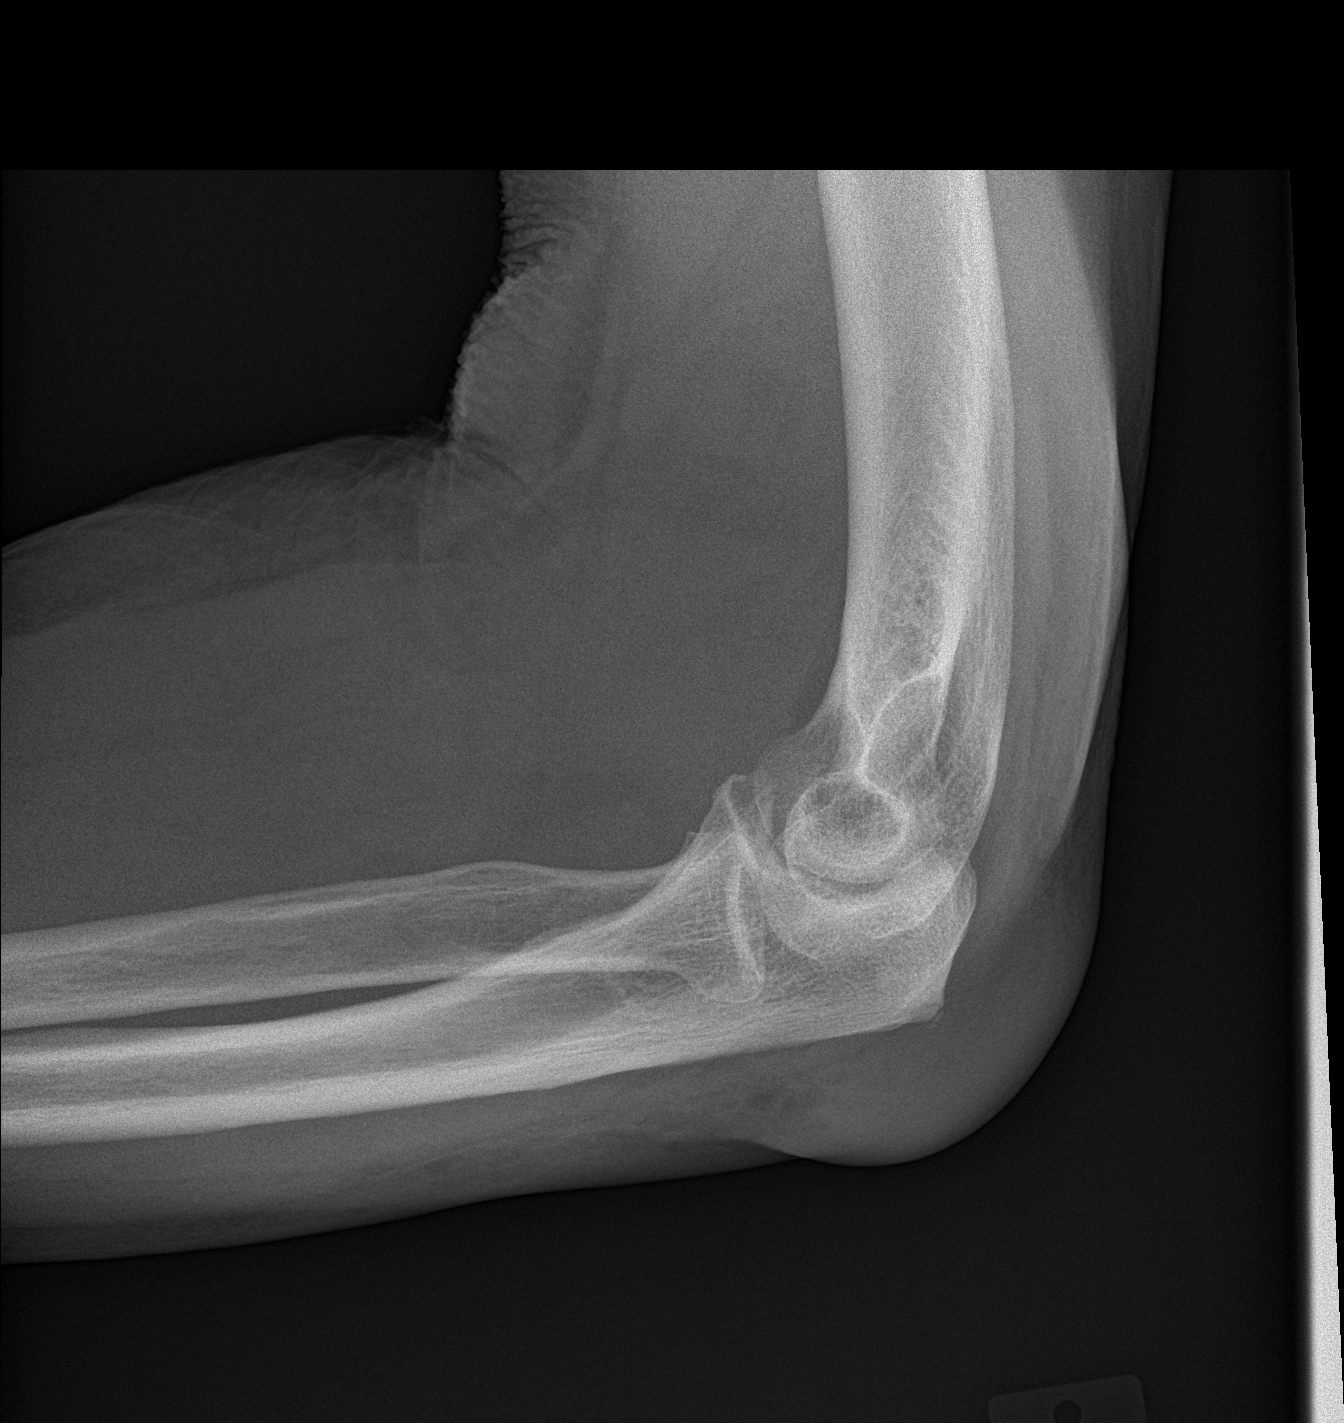

[2 of 2 positions shown; findings below may reference images not displayed]

FINDINGS: Diffuse soft tissue swelling. Elbow joint effusion cannot be
excluded. Corticated bony density noted adjacent to the radial
epicondyle distal humerus. This is most likely from old injury or
non unified secondary ossification center. No acute fracture noted.
Diffuse degenerative change.
IMPRESSION: 1. Diffuse soft tissue swelling. Elbow joint effusion cannot be
excluded. No acute bony abnormality identified.

2. Diffuse degenerative change. Corticated bony density noted
adjacent to the radial epicondyle of the distal humerus. This is
most likely from an old injury or non unified secondary ossification
center.

## 2018-01-03 NOTE — Progress Notes (Signed)
Subjective:    Patient ID: Marcus Fuller, male    DOB: 03/21/1941, 77 y.o.   MRN: [id number]  HPI Patient here today for right elbow fluid.  The patient fell several months ago and hit this right elbow.  The fluid that he is experiencing now has just occurred most recently.  He does not recall any recent injury.  Incidentally he still has upcoming appointments with the cardiologist and with a neurologist.  He did return his FOBT card today and he has never had a colonoscopy.  We will make a decision about that when the FOBT is returned.    Patient Active Problem List   Diagnosis Date Noted  . Cataract    Outpatient Encounter Medications as of 01/03/2018  Medication Sig  . carbidopa-levodopa (SINEMET IR) 10-100 MG tablet Take 2 tablets by mouth 2 (two) times daily.  . primidone (MYSOLINE) 50 MG tablet Take 1 tablet (50 mg total) by mouth at bedtime.  . Vitamin D, Ergocalciferol, (DRISDOL) 50000 units CAPS capsule Take 1 capsule (50,000 Units total) by mouth every 7 (seven) days.  . ALPRAZolam (XANAX) 0.25 MG tablet Take 0.25 mg by mouth 2 (two) times daily as needed for anxiety.   No facility-administered encounter medications on file as of 01/03/2018.       Review of Systems  Constitutional: Negative.   HENT: Negative.   Eyes: Negative.   Respiratory: Negative.   Cardiovascular: Negative.   Gastrointestinal: Negative.   Endocrine: Negative.   Genitourinary: Negative.   Musculoskeletal: Negative.        Right elbow fluid filled area   Skin: Negative.   Allergic/Immunologic: Negative.   Neurological: Negative.   Hematological: Negative.   Psychiatric/Behavioral: Negative.        Objective:   Physical Exam  Constitutional: He is oriented to person, place, and time. He appears well-developed and well-nourished. No distress.  HENT:  Head: Normocephalic.  Eyes: Conjunctivae and EOM are normal. Pupils are equal, round, and reactive to light. Right eye exhibits no  discharge. Left eye exhibits no discharge. No scleral icterus.  Neck: Normal range of motion.  Musculoskeletal: Normal range of motion. He exhibits edema and deformity. He exhibits no tenderness.  Good range of motion of right forearm.  Fluid retention over lateral elbow most likely traumatic hematoma.  Neurological: He is alert and oriented to person, place, and time.  Tremor right hand  Skin: Skin is warm and dry. No rash noted. No erythema.  Psychiatric: He has a normal mood and affect. His behavior is normal. Judgment and thought content normal.  Nursing note and vitals reviewed.   BP (!) 171/92 (BP Location: Left Arm)   Pulse (!) 58   Temp (!) 96.8 F (36 C) (Oral)   Ht 5\' 11"  (1.803 m)   Wt 202 lb (91.6 kg)   BMI 28.17 kg/m   The blood pressure is elevated today, the previous reading was 137/89. We will get another reading before he leaves the office.  The reading was still elevated in the 892 range systolic.    Assessment & Plan:  1. Swelling of right elbow -Reassured and watch - DG Elbow 2 Views Right; Future - Fecal occult blood, imunochemical  2. Essential hypertension -The patient's blood pressure was elevated on 2 occasions today.  He emphasizes to [name] that if he sits around for a while and rechecked at that the blood pressure comes back down to a normal range.  We will have him monitor  the blood pressure at home on a more regular basis and bring readings back to the office at his next visit.  He should also take copies of these readings with him when he goes to see the cardiologist. -He should watch his sodium intake.  Patient Instructions  Avoid overuse of the involved elbow We will call with the results of the x-ray as soon as those results become available Give time and this hematoma should resolve on its own   Arrie Senate MD

## 2018-01-03 NOTE — Patient Instructions (Addendum)
Avoid overuse of the involved elbow We will call with the results of the x-ray as soon as those results become available Give time and this hematoma should resolve on its own

## 2018-01-04 LAB — FECAL OCCULT BLOOD, IMMUNOCHEMICAL: Fecal Occult Bld: NEGATIVE

## 2018-01-08 ENCOUNTER — Encounter: Payer: Self-pay | Admitting: Family Medicine

## 2018-01-16 DIAGNOSIS — H2513 Age-related nuclear cataract, bilateral: Secondary | ICD-10-CM | POA: Diagnosis not present

## 2018-01-22 ENCOUNTER — Ambulatory Visit: Payer: Self-pay | Admitting: Cardiovascular Disease

## 2018-01-29 ENCOUNTER — Encounter: Payer: Self-pay | Admitting: Neurology

## 2018-01-29 ENCOUNTER — Other Ambulatory Visit: Payer: Self-pay

## 2018-01-29 ENCOUNTER — Ambulatory Visit (INDEPENDENT_AMBULATORY_CARE_PROVIDER_SITE_OTHER): Payer: Medicare Other | Admitting: Neurology

## 2018-01-29 ENCOUNTER — Telehealth: Payer: Self-pay | Admitting: Neurology

## 2018-01-29 DIAGNOSIS — G20 Parkinson's disease: Secondary | ICD-10-CM | POA: Diagnosis not present

## 2018-01-29 DIAGNOSIS — R29818 Other symptoms and signs involving the nervous system: Secondary | ICD-10-CM | POA: Diagnosis not present

## 2018-01-29 DIAGNOSIS — G20A1 Parkinson's disease without dyskinesia, without mention of fluctuations: Secondary | ICD-10-CM | POA: Insufficient documentation

## 2018-01-29 HISTORY — DX: Parkinson's disease: G20

## 2018-01-29 HISTORY — DX: Parkinson's disease without dyskinesia, without mention of fluctuations: G20.A1

## 2018-01-29 MED ORDER — BENZTROPINE MESYLATE 0.5 MG PO TABS: 0.5000 mg | ORAL_TABLET | Freq: Two times a day (BID) | ORAL | 3 refills | Status: DC

## 2018-01-29 NOTE — Patient Instructions (Signed)
   We will start Cogentin 0.5 mg twice a day.  We will get a CT of the brain.

## 2018-01-29 NOTE — Progress Notes (Signed)
Reason for visit: Tremor  Referring physician: Dr. [name] is a 77 y.o. male  History of present illness:  Marcus Fuller is a 77 year old right-handed white male with a history of a tremor that is affecting the right upper extremity that has been present for about 2 years.  The patient has moved from [place], [place] in December 2018.  He was treated by a neurologist in that area for an essential tremor and was placed on Mysoline.  The patient could not tolerate doses higher than 50 mg at night.  The patient was then placed on Sinemet for Parkinson's disease.  The tremor has persisted, the tremor affects only the right arm.  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  The patient has tremors mainly when he is resting his arm.  The patient has had micrographia with his handwriting.  The patient indicates that his brother also had a tremor before he passed away.  The patient denies any changes in speech with exception that when he gets very tired he may have a low amplitude speech.  The patient denies any problems with mobility, he denies issues getting up from a chair or walking.  He has not had any memory or concentration issues.  He denies issues controlling the bowels of the bladder, he has not had any numbness or weakness of the extremities.  The patient is sent to this office for an evaluation.  Past Medical History:  Diagnosis Date  . Cataract    beginning stages  . Parkinson's disease ([place]) 01/29/2018  . Tremor of right hand     Past Surgical History:  Procedure Laterality Date  . APPENDECTOMY    . bbb    . PACEMAKER INSERTION  11/2016  . STENT PLACEMENT VASCULAR ([facility]X)  2007  . TONSILLECTOMY      Family History  Problem Relation Age of Onset  . Heart disease Mother   . Diabetes Mother   . Cancer Mother        breast  . Heart  disease Father   . Heart attack Father   . Atrial fibrillation Sister   . Heart disease Brother   . Pancreatic disease Brother   . Tremor Brother   . Heart attack Paternal Grandfather   . Spina bifida Brother     Social history:  reports that  has never smoked. he has never used smokeless tobacco. He reports that he drinks alcohol. He reports that he does not use drugs.  Medications:  Prior to Admission medications   Medication Sig Start Date End Date Taking? Authorizing Provider  primidone (MYSOLINE) 50 MG tablet Take 1 tablet (50 mg total) by mouth at bedtime. 12/05/17  Yes Chipper Herb, MD  Vitamin D, Ergocalciferol, (DRISDOL) 50000 units CAPS capsule Take 1 capsule (50,000 Units total) by mouth every 7 (seven) days. 12/06/17  Yes Chipper Herb, MD  benztropine (COGENTIN) 0.5 MG tablet Take 1 tablet (0.5 mg total) by mouth 2 (two) times daily. 01/29/18   Kathrynn Ducking, MD      Allergies  Allergen Reactions  . Codeine     Unable to tolerate    ROS:  Out of a complete 14 system review of symptoms, the patient complains only of the following symptoms, and all other reviewed systems are negative.  Feeling  cold Joint pain, aching muscles Impotence Headache, dizziness, tremor Anxiety, decreased energy, disinterest in activities  Blood pressure (!) 148/90, pulse (!) 59, height 5\' 10"  (1.778 m), weight 206 lb 8 oz (93.7 kg).  Physical Exam  General: The patient is alert and cooperative at the time of the examination.  Eyes: Pupils are equal, round, and reactive to light. Discs are flat bilaterally.  Neck: The neck is supple, no carotid bruits are noted.  Respiratory: The respiratory examination is clear.  Cardiovascular: The cardiovascular examination reveals a regular rate and rhythm, no obvious murmurs or rubs are noted.  Skin: Extremities are without significant edema.  Neurologic Exam  Mental status: The patient is alert and oriented x 3 at the time of  the examination. The patient has apparent normal recent and remote memory, with an apparently normal attention span and concentration ability.  Cranial nerves: Facial symmetry is present. There is good sensation of the face to pinprick and soft touch bilaterally. The strength of the facial muscles and the muscles to head turning and shoulder shrug are normal bilaterally. Speech is well enunciated, no aphasia or dysarthria is noted. Extraocular movements are full. Visual fields are full. The tongue is midline, and the patient has symmetric elevation of the soft palate. No obvious hearing deficits are noted.  Motor: The motor testing reveals 5 over 5 strength of all 4 extremities. Good symmetric motor tone is noted throughout.  Sensory: Sensory testing is intact to pinprick, soft touch, vibration sensation, and position sense on all 4 extremities. No evidence of extinction is noted.  Coordination: Cerebellar testing reveals good finger-nose-finger and heel-to-shin bilaterally.  Gait and station: The patient is able to rise from a seated position with arms crossed.  Once up, the patient can walk independently, he has decreased arm swing on the right upper extremity with tremor seen with walking.  Tandem gait is very minimally unsteady. Romberg is negative. No drift is seen.  Reflexes: Deep tendon reflexes are symmetric and normal bilaterally. Toes are downgoing bilaterally.   Assessment/Plan:  1.  Right upper extremity tremor, probable Parkinson's disease  The patient appears to have features of parkinsonism.  The patient has a resting tremor involving the right upper extremity and decreased arm swing with walking.  The patient has good mobility, he has had a tremor for at least 2 years with minimal progression of other parkinsonian features.  The patient will be placed on Cogentin for the tremor taking 0.5 mg twice daily.  He will stop the Mysoline.  He will follow-up through this office in about  5-6 months.  A CT scan of the brain will be done.  Jill Alexanders MD 01/29/2018 9:09 AM  [facility] [address] [place], [place] [address]  Phone [phone] Fax [phone]

## 2018-01-29 NOTE — Telephone Encounter (Signed)
Jacqueline/[facility] [phone] called saidbenztropine (COGENTIN) 0.5 MG tablet  is on the beers list and she doesn't have any record that he taken it before. Please call to advise

## 2018-01-29 NOTE — Telephone Encounter (Signed)
I called the patient.  I had already discussed with the patient earlier the potential side effects of Cogentin being blurred vision, constipation, dry mouth, and the potential for confusion.  If the patient appears to have some change in cognitive functioning, he will need to stop the medication.  The patient has Parkinson's disease, Mysoline is not very effective for the tremor, Sinemet usually does not help Parkinson's tremors that much.  Anticholinergic medication such as Artane or Cogentin are more effective, the treatment that is the most effective is deep brain stimulation.

## 2018-01-31 NOTE — Telephone Encounter (Signed)
Called and spoke with Ankit. Advised Dr. Jannifer Franklin aware and has spoken with the patient. He verbalized understanding. Nothing further needed.

## 2018-01-31 NOTE — Telephone Encounter (Signed)
Marcus Fuller with [facility] is calling to discuss medication benztropine (COGENTIN) 0.5 MG tablet. He wants to be sure Dr. Jannifer Franklin knows this medication is on the [facility] list.

## 2018-02-08 ENCOUNTER — Ambulatory Visit (INDEPENDENT_AMBULATORY_CARE_PROVIDER_SITE_OTHER): Payer: Medicare Other | Admitting: Cardiovascular Disease

## 2018-02-08 ENCOUNTER — Ambulatory Visit: Payer: Self-pay | Admitting: Cardiovascular Disease

## 2018-02-08 ENCOUNTER — Encounter: Payer: Self-pay | Admitting: Cardiovascular Disease

## 2018-02-08 VITALS — BP 128/88 | HR 71 | Ht 70.0 in | Wt 206.0 lb

## 2018-02-08 DIAGNOSIS — I5022 Chronic systolic (congestive) heart failure: Secondary | ICD-10-CM

## 2018-02-08 DIAGNOSIS — I2510 Atherosclerotic heart disease of native coronary artery without angina pectoris: Secondary | ICD-10-CM | POA: Diagnosis not present

## 2018-02-08 DIAGNOSIS — R251 Tremor, unspecified: Secondary | ICD-10-CM

## 2018-02-08 DIAGNOSIS — E782 Mixed hyperlipidemia: Secondary | ICD-10-CM

## 2018-02-08 DIAGNOSIS — Z950 Presence of cardiac pacemaker: Secondary | ICD-10-CM | POA: Diagnosis not present

## 2018-02-08 NOTE — Patient Instructions (Signed)
Dr Sallyanne Kuster recommends that you continue on your current medications as directed. Please refer to the Current Medication list given to you today.  Your physician has requested that you have an echocardiogram. Echocardiography is a painless test that uses sound waves to create images of your heart. It provides your doctor with information about the size and shape of your heart and how well your heart's chambers and valves are working. This procedure takes approximately one hour. There are no restrictions for this procedure. >>This will be performed at our [facility] location: [address], [place] [address]  Your physician recommends that you schedule a follow-up appointment in 4-6 weeks in the [facility].  Dr Sallyanne Kuster recommends that you schedule a follow-up appointment in 3 months with a pacemaker check.  If you need a refill on your cardiac medications before your next appointment, please call your pharmacy.

## 2018-02-11 DIAGNOSIS — I2510 Atherosclerotic heart disease of native coronary artery without angina pectoris: Secondary | ICD-10-CM | POA: Insufficient documentation

## 2018-02-11 DIAGNOSIS — Z950 Presence of cardiac pacemaker: Secondary | ICD-10-CM | POA: Insufficient documentation

## 2018-02-11 DIAGNOSIS — E782 Mixed hyperlipidemia: Secondary | ICD-10-CM | POA: Insufficient documentation

## 2018-02-11 DIAGNOSIS — R251 Tremor, unspecified: Secondary | ICD-10-CM | POA: Insufficient documentation

## 2018-02-11 NOTE — Progress Notes (Signed)
Cardiology Consultation Note:    Date:  02/11/2018   ID:  Marcus Fuller, DOB [birth year], MRN [id number]  PCP:  Chipper Herb, MD  Cardiologist:  New  Referring MD: Chipper Herb, MD   Chief Complaint  Patient presents with  . New Patient (Initial Visit)    pt has no active complaints   Marcus Fuller is a 77 y.o. male who is being seen today for the evaluation of CHF, rhythm device follow up at the request of Chipper Herb, MD.   History of Present Illness:    Marcus Fuller is a 77 y.o. male with a remote history of coronary artery disease and stent placement (2007), recently relocated from [place], [place].  While he was living there he saw a Film/video editor in [place].  Those details are not available.  He had a dual-chamber biventricular pacemaker implanted in November 2017 (Medtronic Percepta CRT-P).  The indication is unclear to me.  The device was previously followed by Dr. Arminda Resides, MD, [phone].  Requested records.  He denies any cardiac complaints.  He specifically denies exertional symptoms of angina or dyspnea, although "he is not resumed as he used to be".  His biggest complaint is tremor that is particular prominent in his dominant right hand.  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  Denies dizziness and syncope, but complains of fatigue.  He denies sleepiness during the daytime and does not have dyspnea at rest, orthopnea or PND.  Lower extremity edema, unexpected weight change, intermittent claudication.  Other than the tremor he does not have any focal neurological complaints.  He is seeing Dr. Jannifer Franklin at [facility].  Suspected diagnosis is Parkinson's disease.  Just started on Cogentin.  Interrogated his pacemaker today. The device was implanted in 2017 and the generator has an estimated 11 years of remaining longevity. Medtronic Percepta CRT-P.  The device  was programmed DDD, without rate response turned on, and the histograms are incredibly flat, virtually 100% at 60 bpm.  Activity level is roughly 2.7 hours/day.  The thoracic impedance/optivol has been normal and flat.    Presenting rhythm was AV sequential paced.  The underlying rhythm was atrial sensed, ventricular sensed (sinus bradycardia) at 40 bpm.  There is 87% atrial pacing and 95.5% ventricular pacing.  The device is programmed with adaptive CRT and there is biventricular pacing 30% of the time and LV pacing 70% of the time.  During his exam very frequent premature atrial contractions are seen.  These have a short AV delay and there is no biventricular pacing.  Device shows rare episodes of paroxysmal atrial tachycardia lasting for a few beats and very rare episodes of nonsustained ventricular tachycardia the longest consisting of 8 beats.  Lead parameters are excellent as follows: Atrial lead impedance 418 ohms, threshold 0.5 V at 0.4 ms, P waves 2.5 mV Right ventricular lead impedance 418 ohms, threshold 0.875 V at 0.4 ms, R waves 15.9 mV Left ventricular lead impedance 798 ohms, threshold 1 V at 0.4 ms pulse width (LV 1-LV 2 configuration).  Multiple changes were made to device settings to improve heart rate response to activity and to prevent the loss of biventricular pacing with PACs.  Rate response was turned on with nominal settings.  The AV delay was switched to fixed values of 110 ms with paced beats and a sensed AV delay was shortened to 80 ms.  Atrial  preference pacing was turned on with a maximum rate of 90 bpm.  With the settings the PACs were largely suppressed and there was a biventricular pacing even with the PACs that remained.  Past Medical History:  Diagnosis Date  . Cataract    beginning stages  . Parkinson's disease ([place]) 01/29/2018  . Tremor of right hand     Past Surgical History:  Procedure Laterality Date  . APPENDECTOMY    . bbb    . PACEMAKER INSERTION   11/2016  . STENT PLACEMENT VASCULAR ([place] [facility])  2007  . TONSILLECTOMY      Current Medications: Current Meds  Medication Sig  . aspirin EC 81 MG tablet Take 81 mg by mouth daily.  . benztropine (COGENTIN) 0.5 MG tablet Take 1 tablet (0.5 mg total) by mouth 2 (two) times daily.  . Vitamin D, Ergocalciferol, (DRISDOL) 50000 units CAPS capsule Take 1 capsule (50,000 Units total) by mouth every 7 (seven) days.     Allergies:   Codeine   Social History   Socioeconomic History  . Marital status: Married    Spouse name: None  . Number of children: 2  . Years of education: 5  . Highest education level: None  Social Needs  . Financial resource strain: None  . Food insecurity - worry: None  . Food insecurity - inability: None  . Transportation needs - medical: None  . Transportation needs - non-medical: None  Occupational History  . None  Tobacco Use  . Smoking status: Never Smoker  . Smokeless tobacco: Never Used  Substance and Sexual Activity  . Alcohol use: Yes    Comment: social   . Drug use: No  . Sexual activity: None  Other Topics Concern  . None  Social History Narrative   Lives w/ wife   Caffeine use: sometimes   Right handed      Family History: The patient's family history includes Atrial fibrillation in his sister; Cancer in his mother; Diabetes in his mother; Heart attack in his father and paternal grandfather; Heart disease in his brother, father, and mother; Pancreatic disease in his brother; Spina bifida in his brother; Tremor in his brother.  Reports that his father died of a heart attack at age 2.  ROS:   Please see the history of present illness.    All other systems reviewed and are negative.  EKGs/Labs/Other Studies Reviewed:    The following studies were reviewed today: Notes from Dr. Jannifer Franklin and Georgina Pillion Presence of pacemaker check performed in the office by me.  EKG:  EKG is ordered today.  The ekg ordered today demonstrates AV  sequential pacing with positive R waves in V1.  PACs are seen with native AV conduction and left bundle branch block at 160 ms.  QTc 484 ms  Recent Labs: 12/05/2017: ALT 25; BUN 14; Creatinine, Ser 0.98; Hemoglobin 16.0; Platelets 197; Potassium 4.1; Sodium 143; TSH 4.250  Recent Lipid Panel    Component Value Date/Time   CHOL 190 12/05/2017 1633   TRIG 201 (H) 12/05/2017 1633   HDL 32 (L) 12/05/2017 1633   CHOLHDL 5.9 (H) 12/05/2017 1633   LDLCALC 118 (H) 12/05/2017 1633    Physical Exam:    VS:  BP 128/88 (BP Location: Left Arm, Patient Position: Sitting, Cuff Size: Normal)   Pulse 71   Ht 5\' 10"  (1.778 m)   Wt 206 lb (93.4 kg)   BMI 29.56 kg/m     Wt Readings from Last  3 Encounters:  02/08/18 206 lb (93.4 kg)  01/29/18 206 lb 8 oz (93.7 kg)  01/03/18 202 lb (91.6 kg)     GEN: Overweight Well nourished, well developed in no acute distress HEENT: Normal NECK: No JVD; No carotid bruits LYMPHATICS: No lymphadenopathy CARDIAC: frequent ectopy on a pattern of background RRR, no murmurs, rubs, gallops RESPIRATORY:  Clear to auscultation without rales, wheezing or rhonchi  ABDOMEN: Soft, non-tender, non-distended MUSCULOSKELETAL:  No edema; No deformity  SKIN: Warm and dry NEUROLOGIC:  Alert and oriented x 3, resting tremor right hand PSYCHIATRIC:  Normal affect   ASSESSMENT:    1. Chronic systolic congestive heart failure ([place])   2. Coronary artery disease involving native coronary artery of native heart without angina pectoris   3. Biventricular cardiac pacemaker in situ   4. Mixed hyperlipidemia   5. Tremor of right hand    PLAN:    In order of problems listed above:  1. CHF: Is a presumptive diagnosis based on the fact that he has a biventricular pacemaker.  On the other hand, he is not on any medical therapy for heart failure.  He is not taking diuretics.  Thoracic impedance is flat and he appears clinically euvolemic as well.  We will have to get old records but  will also perform an echo. 2. CAD: reportedly received a stent at [facility] in 2007.  Unclear whether he had additional events/revascularization procedures while living in [place].  He is not taking either aspirin or statin. 3. CRT-P: This is functioning normally, but several opportunities for improvement were taken today.  Rate response turned on at nominal settings.  AV delay shortened,  particularly sensed AV delay at 80 ms due to frequent PACs that did not engage biventricular pacing.  We will enroll him in for remote pacemaker clinic.  Get old records. 4. HLP: Is mild mixed hyperlipidemia with elevated triglycerides and elevated LDL cholesterol.  If he indeed has coronary disease he would need to take statin therapy to bring his LDL to 70 or less.  Again, no old records are available and we will try to confirm the reported diagnosis before we start him on treatment. 5. Tremor: Presumed to be secondary to Parkinson's disease.  He did not respond to primidone in the past.  Now on Cogentin.  If we do confirm that he has low LVEF we will start him on beta-blockers and it will be interesting to see if there is any benefit for the tremor as well.   Medication Adjustments/Labs and Tests Ordered: Current medicines are reviewed at length with the patient today.  Concerns regarding medicines are outlined above.  Orders Placed This Encounter  Procedures  . EKG 12-Lead  . ECHOCARDIOGRAM COMPLETE   No orders of the defined types were placed in this encounter.   Signed, Sanda Klein, MD  02/11/2018 11:28 AM    [place]

## 2018-02-19 ENCOUNTER — Telehealth: Payer: Self-pay | Admitting: Neurology

## 2018-02-19 NOTE — Telephone Encounter (Signed)
Pts wife called stating that benztropine (COGENTIN) 0.5 MG tablet hasnt helped at all. Pts wife is wondering if the dosing can be upped, or changed. Requesting a call back to discuss a little further.

## 2018-02-19 NOTE — Telephone Encounter (Signed)
I called the patient.  The Cogentin is well-tolerated, but is not effective.  The patient may go to 0.5 mg 3 times a day, if this is not effective he will call and I will increase the dose taking 1 mg twice daily.

## 2018-02-20 ENCOUNTER — Other Ambulatory Visit: Payer: Self-pay

## 2018-02-20 ENCOUNTER — Ambulatory Visit (HOSPITAL_COMMUNITY): Payer: Medicare Other | Attending: Cardiovascular Disease

## 2018-02-20 DIAGNOSIS — I358 Other nonrheumatic aortic valve disorders: Secondary | ICD-10-CM | POA: Diagnosis not present

## 2018-02-20 DIAGNOSIS — Z950 Presence of cardiac pacemaker: Secondary | ICD-10-CM | POA: Insufficient documentation

## 2018-02-20 DIAGNOSIS — I5022 Chronic systolic (congestive) heart failure: Secondary | ICD-10-CM

## 2018-02-20 DIAGNOSIS — I2510 Atherosclerotic heart disease of native coronary artery without angina pectoris: Secondary | ICD-10-CM | POA: Insufficient documentation

## 2018-02-20 DIAGNOSIS — I351 Nonrheumatic aortic (valve) insufficiency: Secondary | ICD-10-CM | POA: Diagnosis not present

## 2018-02-20 DIAGNOSIS — I517 Cardiomegaly: Secondary | ICD-10-CM | POA: Insufficient documentation

## 2018-02-20 DIAGNOSIS — E785 Hyperlipidemia, unspecified: Secondary | ICD-10-CM | POA: Insufficient documentation

## 2018-02-20 DIAGNOSIS — I509 Heart failure, unspecified: Secondary | ICD-10-CM | POA: Diagnosis present

## 2018-02-28 LAB — CUP PACEART INCLINIC DEVICE CHECK
Date Time Interrogation Session: 20190313093822
Implantable Lead Implant Date: 20171211
Implantable Lead Implant Date: 20171211
Implantable Lead Model: 4298
Implantable Lead Model: 5076
Implantable Pulse Generator Implant Date: 20171211
MDC IDC LEAD IMPLANT DT: 20171211
MDC IDC LEAD LOCATION: 753858
MDC IDC LEAD LOCATION: 753859
MDC IDC LEAD LOCATION: 753860

## 2018-03-01 ENCOUNTER — Telehealth: Payer: Self-pay | Admitting: Cardiovascular Disease

## 2018-03-01 NOTE — Telephone Encounter (Signed)
ROI faxed to [facility]   RE-faxed ROI. 03/01/18

## 2018-03-06 ENCOUNTER — Telehealth: Payer: Self-pay | Admitting: Cardiovascular Disease

## 2018-03-06 NOTE — Telephone Encounter (Deleted)
Received records from [facility] on 03/06/18. NV

## 2018-03-06 NOTE — Telephone Encounter (Signed)
Received records from [facility] on 03/06/18.NV

## 2018-03-08 ENCOUNTER — Ambulatory Visit (INDEPENDENT_AMBULATORY_CARE_PROVIDER_SITE_OTHER): Payer: Medicare Other | Admitting: *Deleted

## 2018-03-08 DIAGNOSIS — I5022 Chronic systolic (congestive) heart failure: Secondary | ICD-10-CM

## 2018-03-08 LAB — CUP PACEART INCLINIC DEVICE CHECK
Battery Voltage: 3.01 V
Brady Statistic AP VP Percent: 92.43 %
Brady Statistic AP VS Percent: 0.05 %
Brady Statistic AS VP Percent: 3.26 %
Brady Statistic AS VS Percent: 4.26 %
Brady Statistic RA Percent Paced: 92.93 %
Implantable Lead Implant Date: 20171211
Implantable Lead Location: 753858
Implantable Lead Location: 753860
Implantable Lead Model: 4298
Implantable Lead Model: 5076
Implantable Lead Model: 5076
Lead Channel Impedance Value: 323 Ohm
Lead Channel Impedance Value: 342 Ohm
Lead Channel Impedance Value: 399 Ohm
Lead Channel Impedance Value: 646 Ohm
Lead Channel Impedance Value: 646 Ohm
Lead Channel Impedance Value: 665 Ohm
Lead Channel Impedance Value: 760 Ohm
Lead Channel Impedance Value: 779 Ohm
Lead Channel Pacing Threshold Amplitude: 0.5 V
Lead Channel Pacing Threshold Amplitude: 1 V
Lead Channel Pacing Threshold Pulse Width: 0.4 ms
Lead Channel Sensing Intrinsic Amplitude: 17.5 mV
Lead Channel Sensing Intrinsic Amplitude: 2.875 mV
Lead Channel Sensing Intrinsic Amplitude: 22.375 mV
Lead Channel Setting Pacing Amplitude: 1.5 V
Lead Channel Setting Pacing Amplitude: 2 V
Lead Channel Setting Pacing Pulse Width: 0.4 ms
MDC IDC LEAD IMPLANT DT: 20171211
MDC IDC LEAD IMPLANT DT: 20171211
MDC IDC LEAD LOCATION: 753859
MDC IDC MSMT BATTERY REMAINING LONGEVITY: 130 mo
MDC IDC MSMT LEADCHNL LV IMPEDANCE VALUE: 475 Ohm
MDC IDC MSMT LEADCHNL RA IMPEDANCE VALUE: 418 Ohm
MDC IDC MSMT LEADCHNL RA PACING THRESHOLD PULSEWIDTH: 0.4 ms
MDC IDC MSMT LEADCHNL RA SENSING INTR AMPL: 2.25 mV
MDC IDC MSMT LEADCHNL RV PACING THRESHOLD AMPLITUDE: 1 V
MDC IDC MSMT LEADCHNL RV PACING THRESHOLD PULSEWIDTH: 0.4 ms
MDC IDC PG IMPLANT DT: 20171211
MDC IDC SESS DTM: 20190321152916
MDC IDC SET LEADCHNL RA PACING AMPLITUDE: 1.5 V
MDC IDC SET LEADCHNL RV PACING PULSEWIDTH: 0.4 ms
MDC IDC SET LEADCHNL RV SENSING SENSITIVITY: 0.9 mV
MDC IDC STAT BRADY RV PERCENT PACED: 95.67 %

## 2018-03-08 NOTE — Progress Notes (Signed)
CRT-P device check in clinic. Normal device function. Thresholds, sensing, impedance consistent with previous measurements. Histograms appropriate for patient and level of activity. No mode switches. 2 VT episodes EGMs conducted AT. Patient bi-ventricularly pacing 95.7% of the time. Device programmed with appropriate safety margins. Device heart failure diagnostics are within normal limits and stable over time. Estimated longevity 10.8 years. ROV w/ [facility] 05/09/18. Remote 06/07/18.

## 2018-04-28 ENCOUNTER — Encounter (HOSPITAL_COMMUNITY): Payer: Self-pay | Admitting: Emergency Medicine

## 2018-04-28 ENCOUNTER — Emergency Department (HOSPITAL_COMMUNITY): Payer: Medicare Other

## 2018-04-28 ENCOUNTER — Emergency Department (HOSPITAL_COMMUNITY)
Admission: EM | Admit: 2018-04-28 | Discharge: 2018-04-28 | Disposition: A | Discharge status: Home / Self Care | Payer: Medicare Other | Attending: Emergency Medicine | Admitting: Emergency Medicine

## 2018-04-28 ENCOUNTER — Other Ambulatory Visit: Payer: Self-pay

## 2018-04-28 DIAGNOSIS — I700 Atherosclerosis of aorta: Secondary | ICD-10-CM | POA: Diagnosis not present

## 2018-04-28 DIAGNOSIS — R109 Unspecified abdominal pain: Secondary | ICD-10-CM

## 2018-04-28 DIAGNOSIS — Z7982 Long term (current) use of aspirin: Secondary | ICD-10-CM | POA: Insufficient documentation

## 2018-04-28 DIAGNOSIS — R1011 Right upper quadrant pain: Secondary | ICD-10-CM | POA: Diagnosis not present

## 2018-04-28 DIAGNOSIS — K8020 Calculus of gallbladder without cholecystitis without obstruction: Secondary | ICD-10-CM

## 2018-04-28 DIAGNOSIS — I2510 Atherosclerotic heart disease of native coronary artery without angina pectoris: Secondary | ICD-10-CM | POA: Insufficient documentation

## 2018-04-28 DIAGNOSIS — R1013 Epigastric pain: Secondary | ICD-10-CM | POA: Diagnosis not present

## 2018-04-28 DIAGNOSIS — R9431 Abnormal electrocardiogram [ECG] [EKG]: Secondary | ICD-10-CM | POA: Diagnosis not present

## 2018-04-28 DIAGNOSIS — Z950 Presence of cardiac pacemaker: Secondary | ICD-10-CM | POA: Diagnosis not present

## 2018-04-28 LAB — COMPREHENSIVE METABOLIC PANEL
ALBUMIN: 3.6 g/dL (ref 3.5–5.0)
ALT: 30 U/L (ref 17–63)
AST: 49 U/L — AB (ref 15–41)
Alkaline Phosphatase: 79 U/L (ref 38–126)
Anion gap: 9 (ref 5–15)
BUN: 14 mg/dL (ref 6–20)
CHLORIDE: 103 mmol/L (ref 101–111)
CO2: 29 mmol/L (ref 22–32)
Calcium: 9.8 mg/dL (ref 8.9–10.3)
Creatinine, Ser: 1.18 mg/dL (ref 0.61–1.24)
GFR calc Af Amer: >60 mL/min (ref >60)
GFR, EST NON AFRICAN AMERICAN: 59 mL/min — AB (ref >60)
GLUCOSE: 111 mg/dL — AB (ref 65–99)
POTASSIUM: 3.9 mmol/L (ref 3.5–5.1)
SODIUM: 141 mmol/L (ref 135–145)
Total Bilirubin: 0.8 mg/dL (ref 0.3–1.2)
Total Protein: 6.3 g/dL — ABNORMAL LOW (ref 6.5–8.1)

## 2018-04-28 LAB — I-STAT TROPONIN, ED
TROPONIN I, POC: 0 ng/mL (ref 0.00–0.08)
Troponin i, poc: 0 ng/mL (ref 0.00–0.08)

## 2018-04-28 LAB — CBC
HEMATOCRIT: 47.2 % (ref 39.0–52.0)
Hemoglobin: 15.3 g/dL (ref 13.0–17.0)
MCH: 27.7 pg (ref 26.0–34.0)
MCHC: 32.4 g/dL (ref 30.0–36.0)
MCV: 85.5 fL (ref 78.0–100.0)
Platelets: 166 10*3/uL (ref 150–400)
RBC: 5.52 MIL/uL (ref 4.22–5.81)
RDW: 13.2 % (ref 11.5–15.5)
WBC: 8.9 10*3/uL (ref 4.0–10.5)

## 2018-04-28 LAB — URINALYSIS, ROUTINE W REFLEX MICROSCOPIC
Bilirubin Urine: NEGATIVE
GLUCOSE, UA: NEGATIVE mg/dL
Hgb urine dipstick: NEGATIVE
KETONES UR: NEGATIVE mg/dL
LEUKOCYTES UA: NEGATIVE
Nitrite: NEGATIVE
PH: 5 (ref 5.0–8.0)
Protein, ur: NEGATIVE mg/dL
Specific Gravity, Urine: 1.017 (ref 1.005–1.030)

## 2018-04-28 LAB — LIPASE, BLOOD: LIPASE: 26 U/L (ref 11–51)

## 2018-04-28 IMAGING — DX DG CHEST 2V
2 series · 2 of 2 positions shown · non-contrast
Comparison: None.

CLINICAL DATA: Upper abdominal and epigastric pain.

EXAM:
CHEST - 2 VIEW

[chest pa]
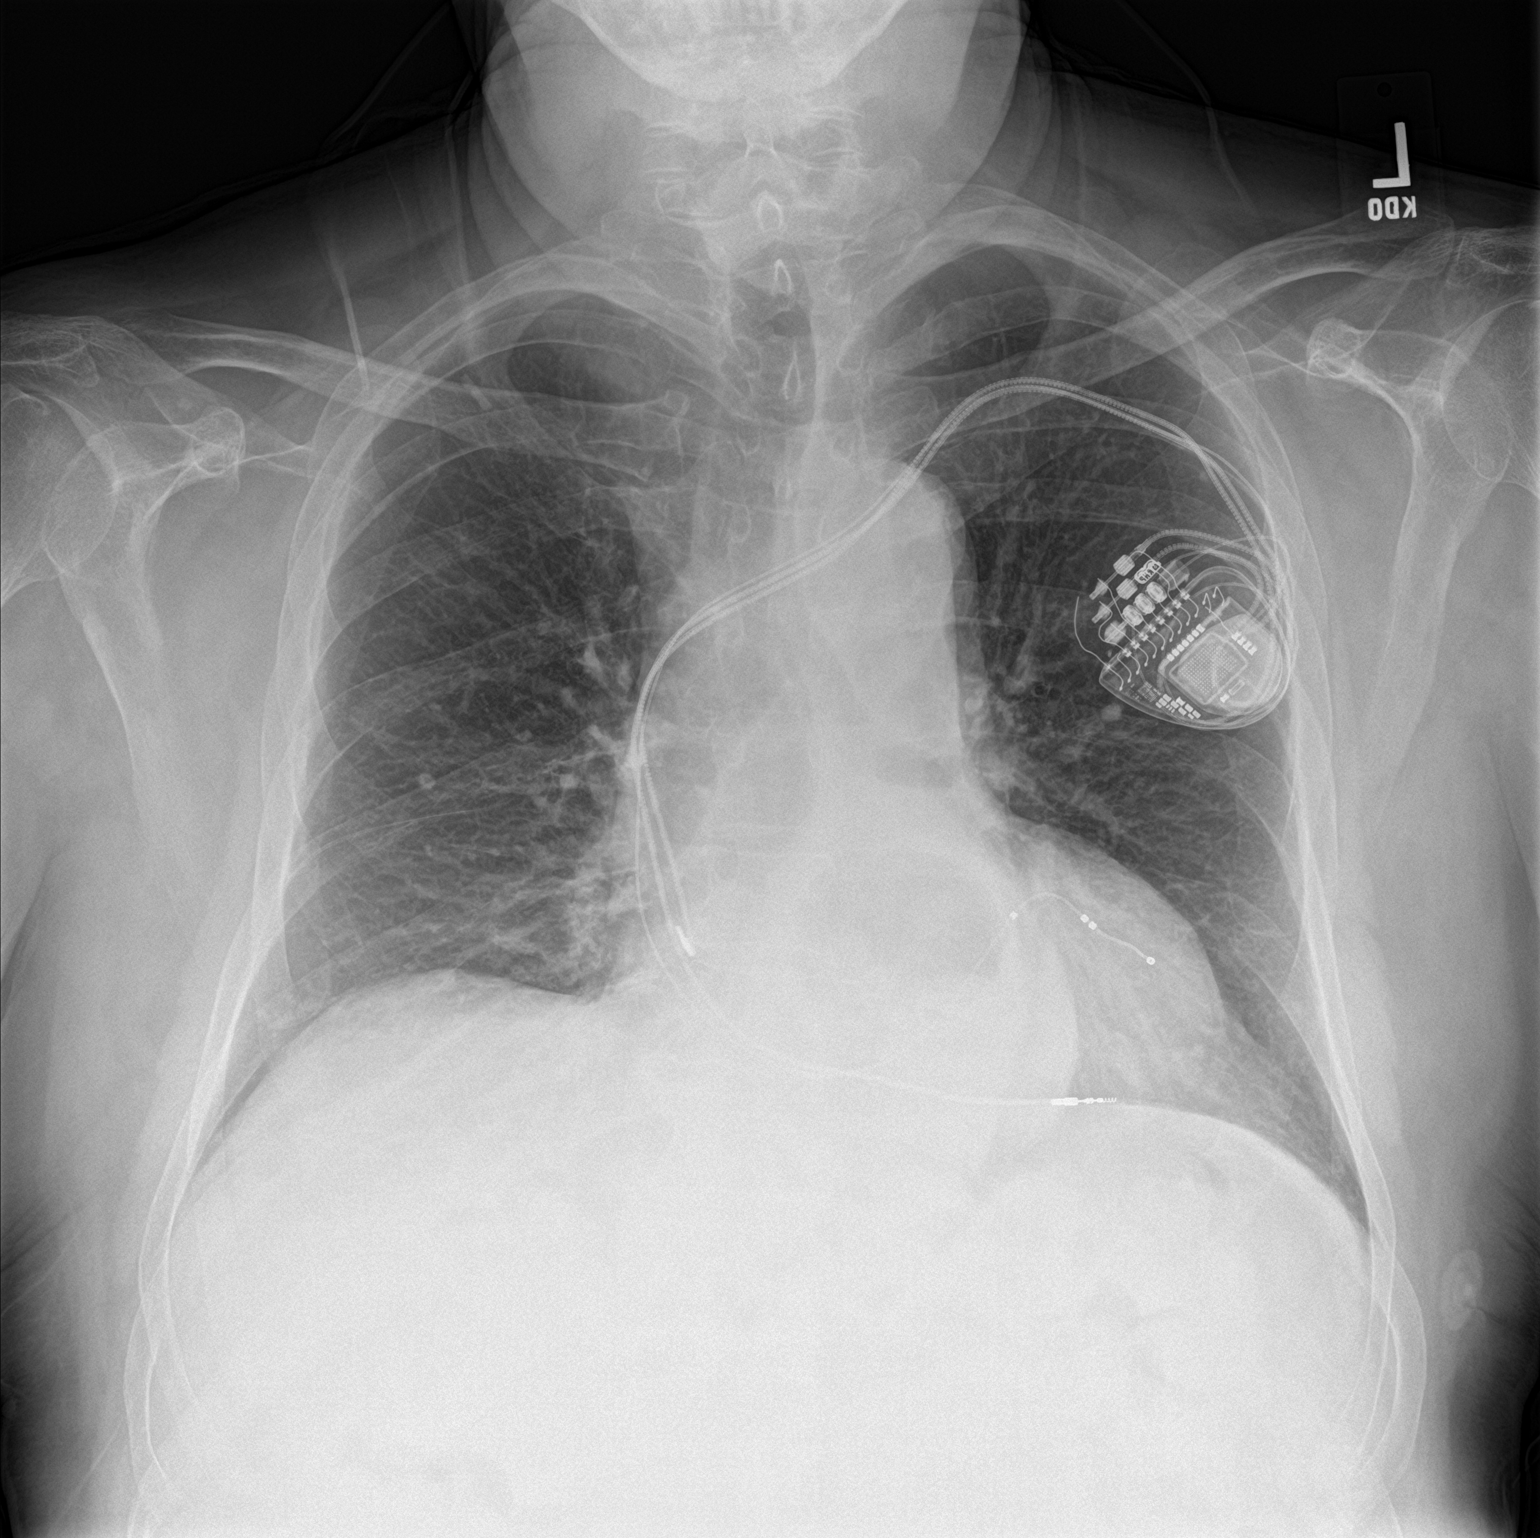

[chest lat]
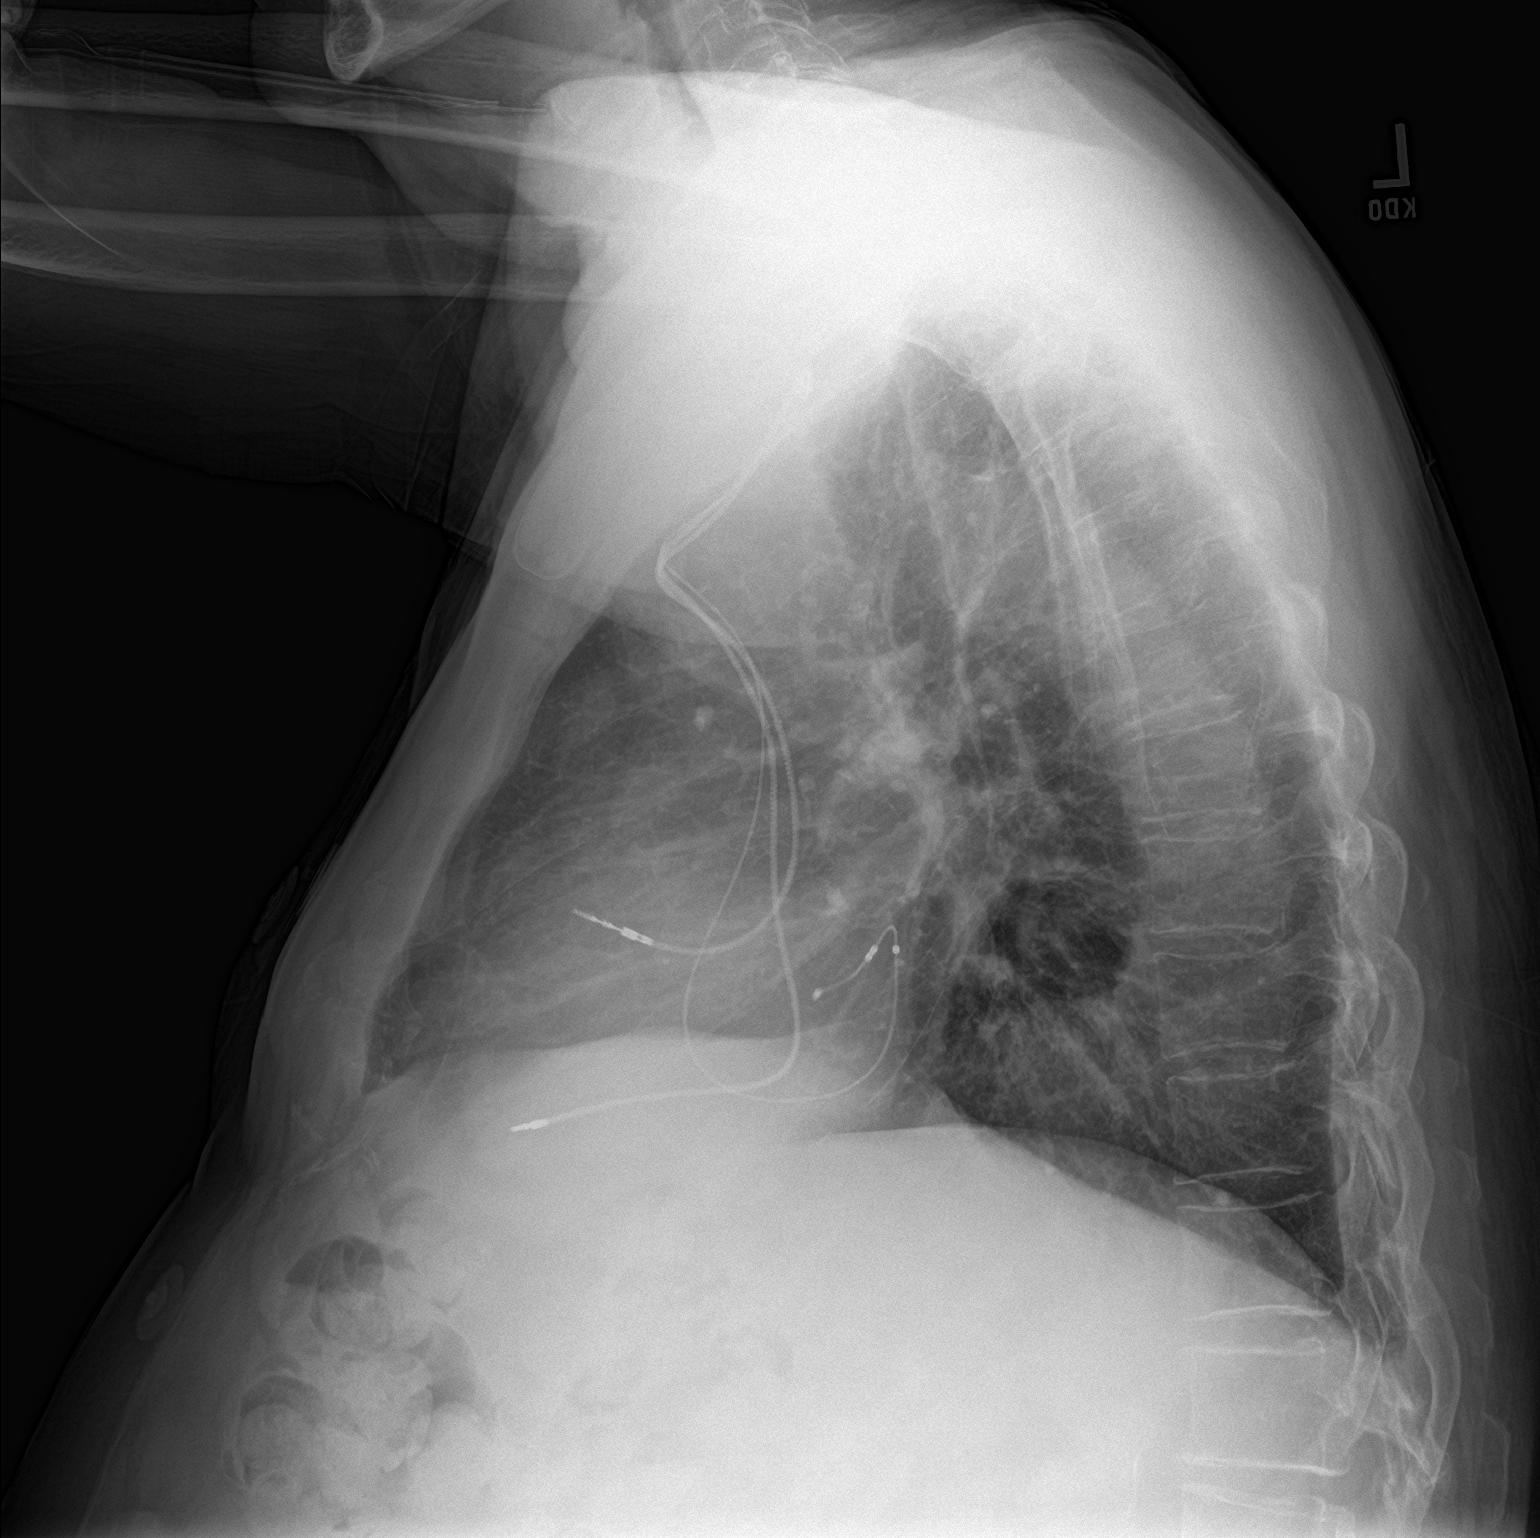

[2 of 2 positions shown; findings below may reference images not displayed]

FINDINGS: Heart size is upper limits of normal. There is a large hiatal
hernia. Pacemaker with right atrial, right ventricular and coronary
sinus leads. Pulmonary vascularity is normal. Lungs are clear. No
effusions. No significant bone finding.
IMPRESSION: No active disease.  Hiatal hernia.  Pacemaker.

## 2018-04-28 IMAGING — CT CT ANGIO CHEST-ABD-PELV FOR DISSECTION W/ AND WO/W CM
2 of 7 series · 10 of 36 positions shown, 15 images · IV contrast (APPLIED)
Comparison: Chest x-ray [DATE]

CLINICAL DATA: C/o upper abd pain that radiates around R side and
into back that woke him up at [DATE]. Denies sob, nausea, and
vomiting

EXAM:
CT ANGIOGRAPHY CHEST, ABDOMEN AND PELVIS
TECHNIQUE: Multidetector CT imaging through the chest, abdomen and pelvis was
performed using the standard protocol during bolus administration of
intravenous contrast. Multiplanar reconstructed images and MIPs were
obtained and reviewed to evaluate the vascular anatomy.
CONTRAST:  <See Chart> [ET] IOPAMIDOL ([ET]) INJECTION
76%

[Series 8: arterial · axial · arterial · 0.98mm/px · z∈[+755,+1341]mm · 9 of 353 slices shown, 13 images]
[im 30/353  mediastinal]
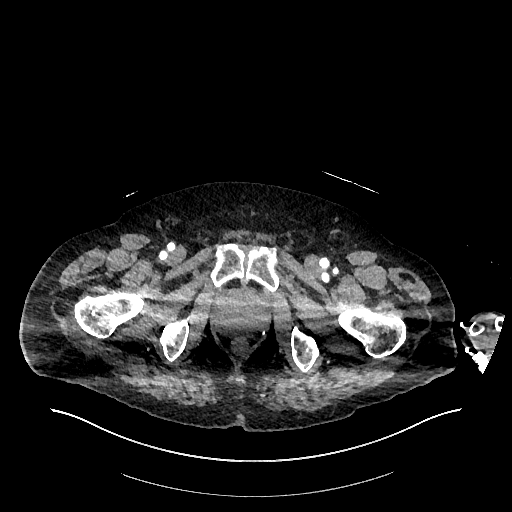
[im 30/353  bone]
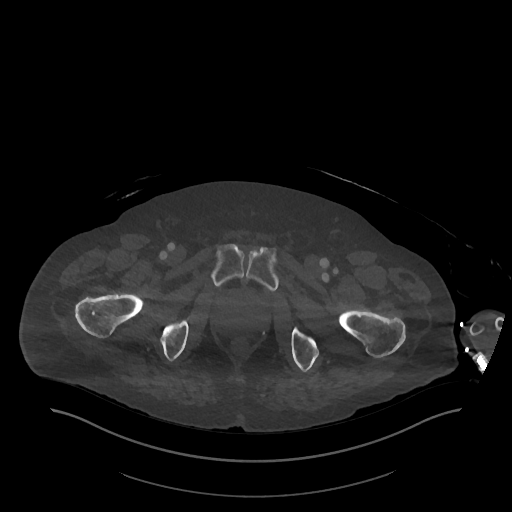
[im 89/353  mediastinal]
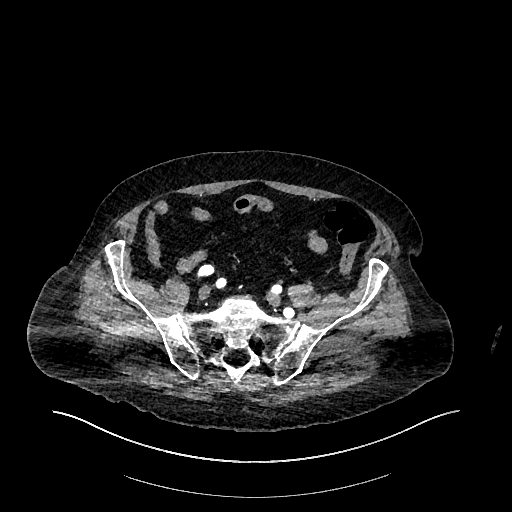
[im 118/353  mediastinal]
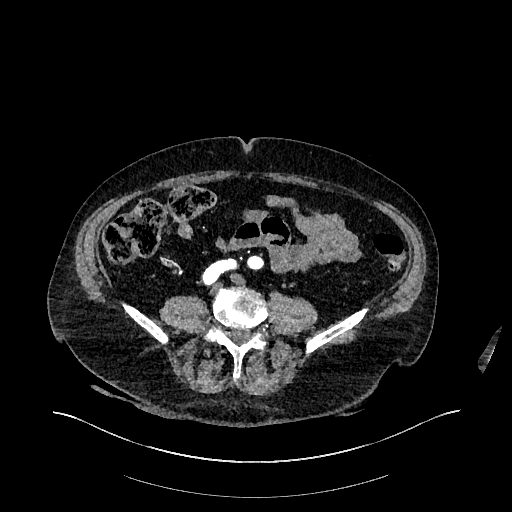
[im 147/353  mediastinal]
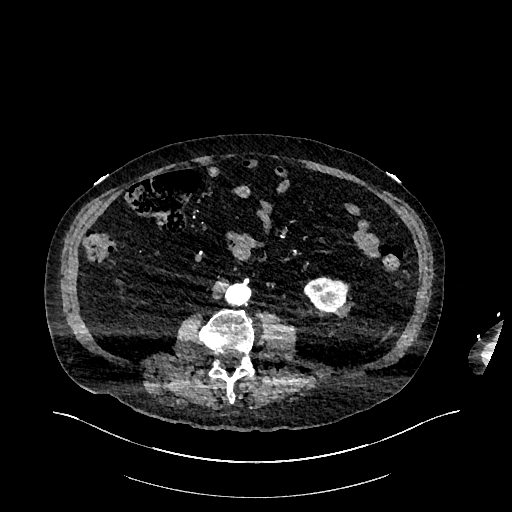
[im 206/353  mediastinal]
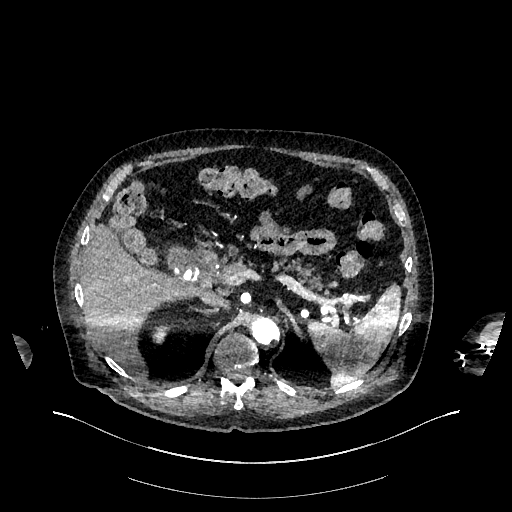
[im 235/353  mediastinal]
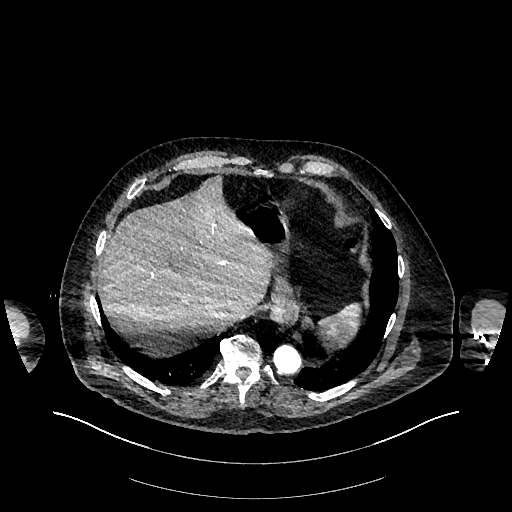
[im 235/353  lung]
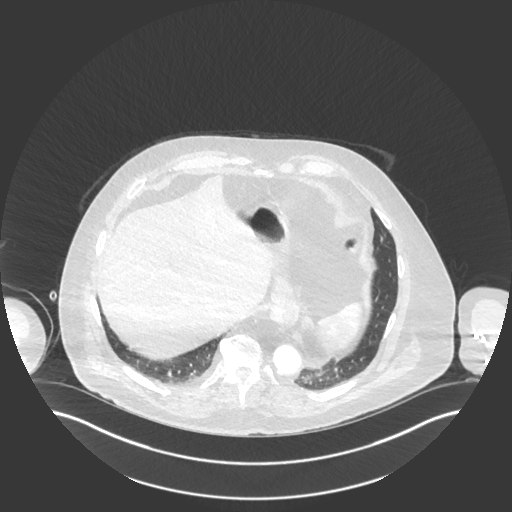
[im 265/353  mediastinal]
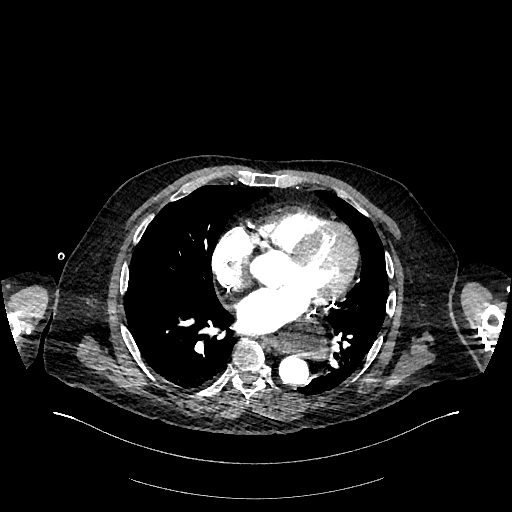
[im 265/353  lung]
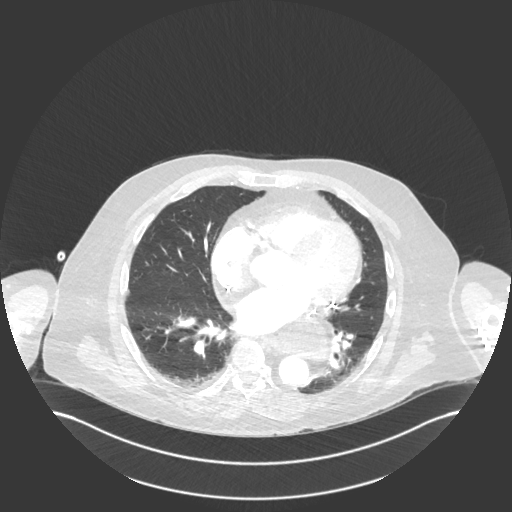
[im 294/353  lung]
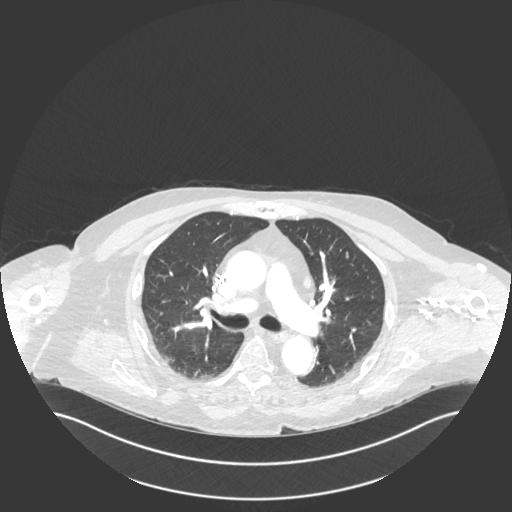
[im 323/353  mediastinal]
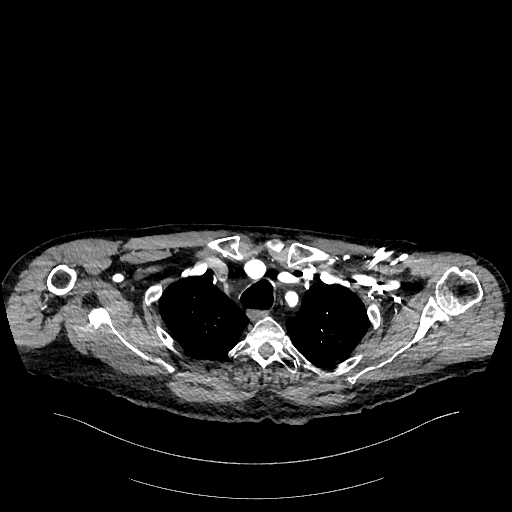
[im 323/353  lung]
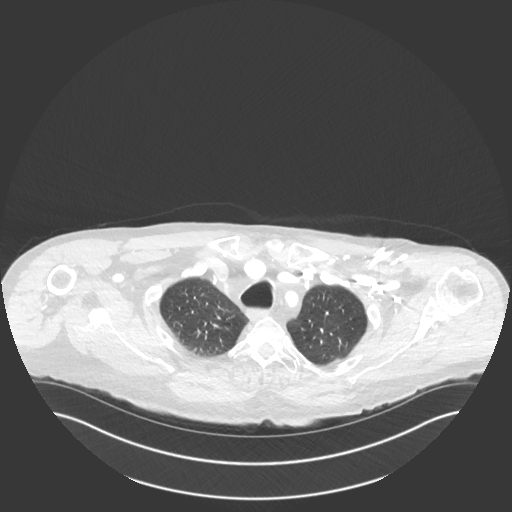

[Series 11: cor · coronal · 1.04mm/px · 1 of 198 slices shown, 2 images]
[im 99/198  mediastinal]
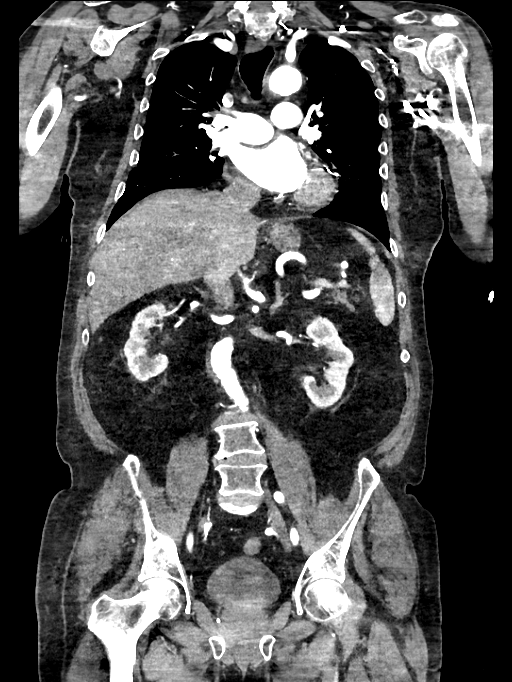
[im 99/198  bone]
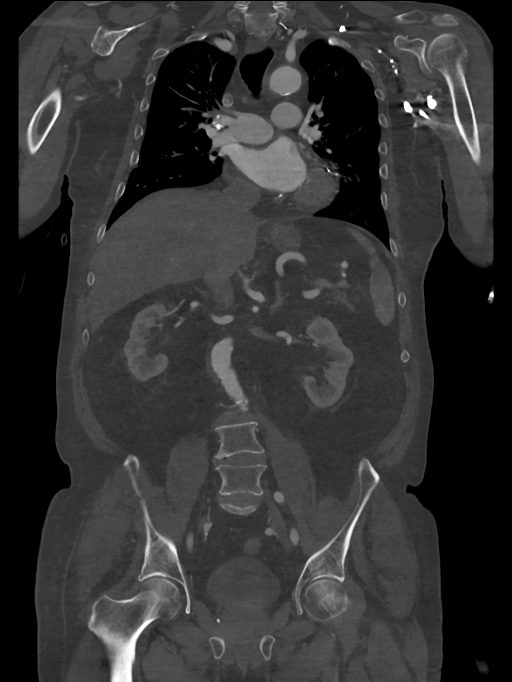

[10 of 36 positions shown; findings below may reference images not displayed]

FINDINGS: CTA CHEST FINDINGS

Cardiovascular: There is moderate atherosclerosis of the thoracic
aorta not associated aneurysm. There is no evidence for dissection.
There is atherosclerotic calcification of the coronary arteries.
Heart size is normal. No pericardial effusion.

Mediastinum/Nodes: The visualized portion of the thyroid gland has a
normal appearance. No mediastinal, hilar, or axillary adenopathy.
Small to moderate hiatal hernia is present.

Lungs/Pleura: There mild dependent changes in the lung bases. No
suspicious pulmonary nodules. No pleural effusions or pulmonary
edema. Small calcified granulomata are identified throughout the
lungs.

Musculoskeletal: No chest wall abnormality. No acute or significant
osseous findings.

Review of the MIP images confirms the above findings.

CTA ABDOMEN AND PELVIS FINDINGS

Aorta: There is atherosclerotic calcification of the abdominal aorta
not associated with aneurysm. No dissection.

Celiac: Patent without evidence of aneurysm, dissection, vasculitis
or significant stenosis.

SMA: Patent without evidence of aneurysm, dissection, vasculitis or
significant stenosis.

Renals: Single bilateral renal arteries have a normal appearance.

IMA: Patent without evidence of aneurysm, dissection, vasculitis or
significant stenosis.

Inflow: Patent without evidence of aneurysm, dissection, vasculitis
or significant stenosis.

Veins: No obvious venous abnormality within the limitations of this
arterial phase study.

Review of the MIP images confirms the above findings.

NON-VASCULAR

Hepatobiliary: No focal liver abnormality is seen. Calcified
gallstones are present. There is no CT evidence for acute
cholecystitis.

Pancreas: Unremarkable. No pancreatic ductal dilatation or the
inflammatory changes.

Spleen: Splenic granulomata are present. No suspicious splenic
lesion.

Adrenals/Urinary Tract: There are bilateral renal cysts. No
hydronephrosis or suspicious renal mass. Along the posterior aspect
of the urinary bladder there is a calcification measuring 8 x 12
millimeters. Due to artifact from the stone/calcification, is not
possible to determine if this is within the dependent aspect of the
bladder, within the bladder wall, or posterior to the bladder.

Stomach/Bowel: Stomach is within normal limits. Appendix appears
normal. No evidence of bowel wall thickening, distention, or
inflammatory changes.

Lymphatic: No significant vascular findings are present. No enlarged
abdominal or pelvic lymph nodes.

Reproductive: Prostate is enlarged.

Other: No abdominal wall hernia or abnormality. No abdominopelvic
ascites.

Musculoskeletal: There are degenerative changes in the lumbar spine.
No suspicious lytic or blastic lesions are identified. Benign lipoma
within the LEFT tensor fascia lata.

Review of the MIP images confirms the above findings.
IMPRESSION: 1. No evidence for aortic dissection or aneurysm.
2.  Aortic atherosclerosis.  ([ET]-[ET])
3. Small to moderate hiatal hernia.
4. Cholelithiasis.
5. Splenic and pulmonary granulomata consistent with remote
granulomatous disease
6. Bilateral renal cysts.
7. Bladder stone versus calcification of bladder wall. Delayed
noncontrast prone images of the pelvis could be performed as needed
to determine presence/absence of a bladder stone.
8. Enlarged prostate.
9. Degenerative disc disease in the lumbar spine.
10. Benign lipoma in the LEFT tensor fascia lata.

## 2018-04-28 MED ORDER — TRAMADOL HCL 50 MG PO TABS: 50.0000 mg | ORAL_TABLET | Freq: Four times a day (QID) | ORAL | 0 refills | Status: DC | PRN

## 2018-04-28 MED ORDER — ONDANSETRON HCL 4 MG PO TABS: 4.0000 mg | ORAL_TABLET | Freq: Three times a day (TID) | ORAL | 0 refills | Status: DC | PRN

## 2018-04-28 MED ORDER — ONDANSETRON HCL 4 MG/2ML IJ SOLN
4.0000 mg | Freq: Once | INTRAMUSCULAR | Status: AC
  Administered 2018-04-28: 4 mg via INTRAVENOUS
  Filled 2018-04-28: qty 2

## 2018-04-28 MED ORDER — IOPAMIDOL (ISOVUE-370) INJECTION 76%
100.0000 mL | Freq: Once | INTRAVENOUS | Status: AC | PRN
  Administered 2018-04-28: 100 mL via INTRAVENOUS

## 2018-04-28 MED ORDER — MORPHINE SULFATE (PF) 4 MG/ML IV SOLN
4.0000 mg | Freq: Once | INTRAVENOUS | Status: AC
  Administered 2018-04-28: 4 mg via INTRAVENOUS
  Filled 2018-04-28: qty 1

## 2018-04-28 MED ORDER — MORPHINE SULFATE (PF) 4 MG/ML IV SOLN
2.0000 mg | Freq: Once | INTRAVENOUS | Status: AC
  Administered 2018-04-28: 2 mg via INTRAVENOUS
  Filled 2018-04-28: qty 1

## 2018-04-28 MED ORDER — IOPAMIDOL (ISOVUE-370) INJECTION 76%
INTRAVENOUS | Status: AC
  Filled 2018-04-28: qty 100

## 2018-04-28 NOTE — ED Provider Notes (Signed)
Care assumed from [facility] at shift change with RUQ [name] pending.   In brief, this patient is a 77 y.o. male past medical history of Parkinson's disease, CAD, pacemaker placement, hyperlipidemia who presents emergency department today for epigastric abdominal pain that radiates to his back as well as the right upper quadrant.  Patient is noted to have a elevated blood pressure on arrival.  CTA dissection study was performed and negative for dissection.  Patient did have incidental findings as described in previous providers note.  EKG was reassuring.  Troponin x2 was negative.  He has had no chest pain or shortness of breath.  He is without leukocytosis, anemia, acute kidney injury, electrolyte derangements, anion gap acidosis or significant changes in LFTs.  His UA was without evidence of UTI.  Lipase is within normal limits.  At time of signout right upper quadrant ultrasound is pending.  PLAN: Pending RUQ [name].  If medical quadrant ultrasound negative for cholecystitis patient can be discharged home.  Gen: afebrile, VSS HEENT: Atraumatic, EOMI Resp: no resp distress CV: RRR Abd: soft, NT, ND MsK: moving all extremities well Neuro: A&O x4  MDM:  Patient right upper quadrant ultrasound consistent with cholelithiasis without evidence of cholecystitis.  There is no gallbladder wall thickening or pericholecystic fluid.  Common bile duct within normal limits.  Again patient without leukocytosis, significant LFT changes or elevation of lipase.  Do not feel the patient has cholecystitis at this time.  Repeat abdominal exam without any tenderness to palpation.  No peritoneal signs. He denies pain at this time.  He is without any nausea or vomiting.  Will discharge the patient home with follow-up with [place] surgery.  Previous provider has provided the patient with tramadol prescription.  Will also give Zofran. Specific return precautions discussed. Time was given for all questions to be  answered. The patient verbalized understanding and agreement with plan. The patient appears safe for discharge home.  1. Calculus of gallbladder without cholecystitis without obstruction   2. Abdominal pain       Lorelle Gibbs 04/28/18 1835    Tegeler, Gwenyth Allegra, MD 04/29/18 [phone]

## 2018-04-28 NOTE — ED Notes (Signed)
ED Provider at bedside. 

## 2018-04-28 NOTE — ED Triage Notes (Signed)
C/o upper abd pain that radiates around R side and into back that woke him up at 4:30am.  Denies sob, nausea, and vomiting.

## 2018-04-28 NOTE — Discharge Instructions (Addendum)
You were seen in the emergency department today for abdominal pain.  We are suspicious that this is related to gallstones.    For this reason we would like you to follow-up with general surgery.  Please call the general surgeon's office on Monday in order to schedule an appointment for sometime early next week.  We are sending you home with a prescription for tramadol for pain, take this every 6 hours as needed for pain. We have prescribed you new medication(s) today. Discuss the medications prescribed today with your pharmacist as they can have adverse effects and interactions with your other medicines including over the counter and prescribed medications. Seek medical evaluation if you start to experience new or abnormal symptoms after taking one of these medicines, seek care immediately if you start to experience difficulty breathing, feeling of your throat closing, facial swelling, or rash as these could be indications of a more serious allergic reaction   Please be sure to call the general surgeon's office as discussed above.  Return to the ER anytime for any new or worsening symptoms including but not limited to fever, inability to keep fluids down, blood in your stool, worsening pain, or any other concerns.  There were other incidental findings on your CT scan which we discussed, we do not feel these are related to the pain you are having right now.

## 2018-04-28 NOTE — ED Notes (Signed)
Pt in CT.

## 2018-04-28 NOTE — ED Provider Notes (Signed)
[facility] Provider Note   CSN: [id number] Arrival date & time: 04/28/18  3086     History   Chief Complaint Chief Complaint  Patient presents with  . Abdominal Pain    HPI [facility] is a 77 y.o. male with a hx of Parkinson's disease, CAD, biventricular cardiac pacemaker, and hyperlipidemia who presents to the ED with complaints of abdominal pain that started at 0400 this AM.  Patient states yesterday was a fairly normal day for him, he was awoken from sleep due to pain in his epigastric region this AM.  He states the pain starts in the epigastric region, feels as if it radiates through to the back, and wraps around the right upper quadrant/right back.  Describes the pain as sharp.  It has been constant since it started it is somewhat worse at present.  Rates his pain an 8 out of 10 in severity, it is worse if he is up and moving around and is worse if he is laying on his back.  No specific alleviating factors.  He did not try intervention prior to arrival.  He has not had a history of similar discomfort. No associated sxs.  Denies fever, chills, nausea, vomiting, diaphoresis, lightheadedness, dizziness, numbness, weakness, or syncope. He has hx of nephrolithiasis, this does not feel similar.   He reports his blood pressure is abnormally high in the emergency department today, he typically runs in the 578 systolic and 46N diastolic.  HPI  Past Medical History:  Diagnosis Date  . Cataract    beginning stages  . Parkinson's disease ([place]) 01/29/2018  . Tremor of right hand     Patient Active Problem List   Diagnosis Date Noted  . Coronary artery disease involving native coronary artery of native heart without angina pectoris 02/11/2018  . Biventricular cardiac pacemaker in situ 02/11/2018  . Mixed hyperlipidemia 02/11/2018  . Tremor of right hand 02/11/2018  . Parkinson's disease ([place]) 01/29/2018  . Cataract     Past Surgical History:   Procedure Laterality Date  . APPENDECTOMY    . bbb    . PACEMAKER INSERTION  11/2016  . STENT PLACEMENT VASCULAR ([facility] HX)  2007  . TONSILLECTOMY          Home Medications    Prior to Admission medications   Medication Sig Start Date End Date Taking? Authorizing Provider  aspirin EC 81 MG tablet Take 81 mg by mouth daily.    [provider]  benztropine (COGENTIN) 0.5 MG tablet Take 1 tablet (0.5 mg total) by mouth 2 (two) times daily. 01/29/18   Kathrynn Ducking, MD  primidone (MYSOLINE) 50 MG tablet Take 1 tablet (50 mg total) by mouth at bedtime. 12/05/17   Chipper Herb, MD  Vitamin D, Ergocalciferol, (DRISDOL) 50000 units CAPS capsule Take 1 capsule (50,000 Units total) by mouth every 7 (seven) days. 12/06/17   Chipper Herb, MD    Family History Family History  Problem Relation Age of Onset  . Heart disease Mother   . Diabetes Mother   . Cancer Mother        breast  . Heart disease Father   . Heart attack Father   . Atrial fibrillation Sister   . Heart disease Brother   . Pancreatic disease Brother   . Tremor Brother   . Heart attack Paternal Grandfather   . Spina bifida Brother     Social History Social History   Tobacco  Use  . Smoking status: Never Smoker  . Smokeless tobacco: Never Used  Substance Use Topics  . Alcohol use: Yes    Comment: social   . Drug use: No     Allergies   Codeine   Review of Systems Review of Systems  Constitutional: Negative for chills and fever.  Respiratory: Negative for shortness of breath.   Cardiovascular: Negative for chest pain.  Gastrointestinal: Positive for abdominal pain and constipation (chronic unchanged, last BM tuesday this is fairly typical for him). Negative for diarrhea, nausea and vomiting.  Genitourinary: Negative for dysuria, scrotal swelling and testicular pain.  Neurological: Negative for dizziness, speech difficulty, weakness, light-headedness and numbness.  Psychiatric/Behavioral:  Negative for confusion.  All other systems reviewed and are negative.    Physical Exam Updated Vital Signs BP (!) 179/120 (BP Location: Right Arm)   Pulse 69   Temp 98 F (36.7 C) (Oral)   Resp 20   SpO2 96%   Physical Exam  Constitutional: He appears well-developed and well-nourished.  Non-toxic appearance. He appears distressed (mild secondary to discomfort).  HENT:  Head: Normocephalic and atraumatic.  Eyes: Conjunctivae are normal. Right eye exhibits no discharge. Left eye exhibits no discharge.  Cardiovascular: Normal rate and regular rhythm.  No murmur heard. Pulses:      Radial pulses are 2+ on the right side, and 2+ on the left side.       Dorsalis pedis pulses are 2+ on the right side, and 2+ on the left side.  Pulmonary/Chest: Effort normal and breath sounds normal. No respiratory distress. He has no wheezes. He has no rales.  Abdominal: Soft. He exhibits no distension and no mass. There is tenderness (epigastric and RUQ). There is no rebound and no guarding.  Musculoskeletal: He exhibits no edema or tenderness.  Back: No midline tenderness to palpation.  No paraspinal muscle tenderness palpation.  No CVA tenderness.  Neurological: He is alert.  Clear speech.  5 out of 5 symmetric grip strength.  Sensation grossly intact bilateral upper extremities.  Skin: Skin is warm and dry. No rash noted.  Psychiatric: He has a normal mood and affect. His behavior is normal.  Nursing note and vitals reviewed.   ED Treatments / Results  Labs Results for orders placed or performed during the hospital encounter of 04/28/18  Lipase, blood  Result Value Ref Range   Lipase 26 11 - 51 U/L  Comprehensive metabolic panel  Result Value Ref Range   Sodium 141 135 - 145 mmol/L   Potassium 3.9 3.5 - 5.1 mmol/L   Chloride 103 101 - 111 mmol/L   CO2 29 22 - 32 mmol/L   Glucose, Bld 111 (H) 65 - 99 mg/dL   BUN 14 6 - 20 mg/dL   Creatinine, Ser 1.18 0.61 - 1.24 mg/dL   Calcium 9.8 8.9 -  10.3 mg/dL   Total Protein 6.3 (L) 6.5 - 8.1 g/dL   Albumin 3.6 3.5 - 5.0 g/dL   AST 49 (H) 15 - 41 U/L   ALT 30 17 - 63 U/L   Alkaline Phosphatase 79 38 - 126 U/L   Total Bilirubin 0.8 0.3 - 1.2 mg/dL   GFR calc non Af Amer 59 (L) >60 mL/min   GFR calc Af Amer >60 >60 mL/min   Anion gap 9 5 - 15  CBC  Result Value Ref Range   WBC 8.9 4.0 - 10.5 K/uL   RBC 5.52 4.22 - 5.81 MIL/uL   Hemoglobin 15.3  13.0 - 17.0 g/dL   HCT 47.2 39.0 - 52.0 %   MCV 85.5 78.0 - 100.0 fL   MCH 27.7 26.0 - 34.0 pg   MCHC 32.4 30.0 - 36.0 g/dL   RDW 13.2 11.5 - 15.5 %   Platelets 166 150 - 400 K/uL  Urinalysis, Routine w reflex microscopic  Result Value Ref Range   Color, Urine YELLOW YELLOW   APPearance CLEAR CLEAR   Specific Gravity, Urine 1.017 1.005 - 1.030   pH 5.0 5.0 - 8.0   Glucose, UA NEGATIVE NEGATIVE mg/dL   Hgb urine dipstick NEGATIVE NEGATIVE   Bilirubin Urine NEGATIVE NEGATIVE   Ketones, ur NEGATIVE NEGATIVE mg/dL   Protein, ur NEGATIVE NEGATIVE mg/dL   Nitrite NEGATIVE NEGATIVE   Leukocytes, UA NEGATIVE NEGATIVE  I-stat troponin, ED  Result Value Ref Range   Troponin i, poc 0.00 0.00 - 0.08 ng/mL   Comment 3          I-stat troponin, ED  Result Value Ref Range   Troponin i, poc 0.00 0.00 - 0.08 ng/mL   Comment 3          EKG EKG Interpretation  Date/Time:  Saturday Apr 28 2018 06:24:21 EDT Ventricular Rate:  81 PR Interval:    QRS Duration: 162 QT Interval:  448 QTC Calculation: 520 R Axis:   -117 Text Interpretation:  AV dual-paced rhythm Biventricular pacemaker detected Abnormal ECG av paced noted Confirmed by Thomasene Lot, [place] [phone]) on 04/28/2018 10:41:44 AM   Radiology Dg Chest 2 View  Result Date: 04/28/2018 CLINICAL DATA:  Upper abdominal and epigastric pain. EXAM: CHEST - 2 VIEW COMPARISON:  None. FINDINGS: Heart size is upper limits of normal. There is a large hiatal hernia. Pacemaker with right atrial, right ventricular and coronary sinus leads. Pulmonary  vascularity is normal. Lungs are clear. No effusions. No significant bone finding. IMPRESSION: No active disease.  Hiatal hernia.  Pacemaker. Electronically Signed   By: Nelson Chimes M.D.   On: 04/28/2018 07:06   Ct Angio Chest/abd/pel For Dissection W And/or W/wo  Result Date: 04/28/2018 CLINICAL DATA:  C/o upper abd pain that radiates around R side and into back that woke him up at 4:30am. Denies sob, nausea, and vomiting EXAM: CT ANGIOGRAPHY CHEST, ABDOMEN AND PELVIS TECHNIQUE: Multidetector CT imaging through the chest, abdomen and pelvis was performed using the standard protocol during bolus administration of intravenous contrast. Multiplanar reconstructed images and MIPs were obtained and reviewed to evaluate the vascular anatomy. CONTRAST:  <See Chart> ISOVUE-370 IOPAMIDOL (ISOVUE-370) INJECTION 76% COMPARISON:  Chest x-ray 04/28/2018 FINDINGS: CTA CHEST FINDINGS Cardiovascular: There is moderate atherosclerosis of the thoracic aorta not associated aneurysm. There is no evidence for dissection. There is atherosclerotic calcification of the coronary arteries. Heart size is normal. No pericardial effusion. Mediastinum/Nodes: The visualized portion of the thyroid gland has a normal appearance. No mediastinal, hilar, or axillary adenopathy. Small to moderate hiatal hernia is present. Lungs/Pleura: There mild dependent changes in the lung bases. No suspicious pulmonary nodules. No pleural effusions or pulmonary edema. Small calcified granulomata are identified throughout the lungs. Musculoskeletal: No chest wall abnormality. No acute or significant osseous findings. Review of the MIP images confirms the above findings. CTA ABDOMEN AND PELVIS FINDINGS Aorta: There is atherosclerotic calcification of the abdominal aorta not associated with aneurysm. No dissection. Celiac: Patent without evidence of aneurysm, dissection, vasculitis or significant stenosis. SMA: Patent without evidence of aneurysm, dissection,  vasculitis or significant stenosis. Renals: Single bilateral renal arteries have  a normal appearance. IMA: Patent without evidence of aneurysm, dissection, vasculitis or significant stenosis. Inflow: Patent without evidence of aneurysm, dissection, vasculitis or significant stenosis. Veins: No obvious venous abnormality within the limitations of this arterial phase study. Review of the MIP images confirms the above findings. NON-VASCULAR Hepatobiliary: No focal liver abnormality is seen. Calcified gallstones are present. There is no CT evidence for acute cholecystitis. Pancreas: Unremarkable. No pancreatic ductal dilatation or the inflammatory changes. Spleen: Splenic granulomata are present. No suspicious splenic lesion. Adrenals/Urinary Tract: There are bilateral renal cysts. No hydronephrosis or suspicious renal mass. Along the posterior aspect of the urinary bladder there is a calcification measuring 8 x 12 millimeters. Due to artifact from the stone/calcification, is not possible to determine if this is within the dependent aspect of the bladder, within the bladder wall, or posterior to the bladder. Stomach/Bowel: Stomach is within normal limits. Appendix appears normal. No evidence of bowel wall thickening, distention, or inflammatory changes. Lymphatic: No significant vascular findings are present. No enlarged abdominal or pelvic lymph nodes. Reproductive: Prostate is enlarged. Other: No abdominal wall hernia or abnormality. No abdominopelvic ascites. Musculoskeletal: There are degenerative changes in the lumbar spine. No suspicious lytic or blastic lesions are identified. Benign lipoma within the LEFT tensor fascia lata. Review of the MIP images confirms the above findings. IMPRESSION: 1. No evidence for aortic dissection or aneurysm. 2.  Aortic atherosclerosis.  (ICD10-I70.0) 3. Small to moderate hiatal hernia. 4. Cholelithiasis. 5. Splenic and pulmonary granulomata consistent with remote granulomatous  disease 6. Bilateral renal cysts. 7. Bladder stone versus calcification of bladder wall. Delayed noncontrast prone images of the pelvis could be performed as needed to determine presence/absence of a bladder stone. 8. Enlarged prostate. 9. Degenerative disc disease in the lumbar spine. 10. Benign lipoma in the LEFT tensor fascia lata. Electronically Signed   By: Nolon Nations M.D.   On: 04/28/2018 14:27   Procedures Procedures (including critical care time)  Medications Ordered in ED Medications  morphine 4 MG/ML injection 4 mg (4 mg Intravenous Given 04/28/18 1131)  ondansetron (ZOFRAN) injection 4 mg (4 mg Intravenous Given 04/28/18 1131)  iopamidol (ISOVUE-370) 76 % injection 100 mL (100 mLs Intravenous Contrast Given 04/28/18 1303)   Initial Impression / Assessment and Plan / ED Course  I have reviewed the triage vital signs and the nursing notes.  Pertinent labs & imaging results that were available during my care of the patient were reviewed by me and considered in my medical decision making (see chart for details).   Patient presents with epigastric pain which radiates through to back and around to right side. Patient appears uncomfortable, he is notably hypertensive. Patient is tender in the epigastric and RUQ. Blood pressure taken in both extremities by me and similar. DDX: dissection, ACS, cholecystitis, symptomatic cholelithiasis, pancreatitis, GERD, pyelonephritis, nephrolithiasis. Given descriptor of patient's discomfort and hypertension will evaluate with CT angio chest/abd/pelvis for dissection. Morphine and zofran ordered.   Work-up per triage has been reviewed. Labs reviewed and fairly unremarkable. No leukocytosis. No anemia. No significant electrolyte abnormalities. Lipase and renal fxn WNL. AST mildly elevated at 49, ALT and total bilirubin WNL. UA without obvious infection, doubt cystitis/pyelonephritis. CXR  No active disease, hiatal hernia and pacemaker present. Troponin WNL  x2, EKG with paced rhythm, doubt ACS.   14:30: RE-EVAL: Patient feeling improved following morphine, states pain is a 3/10 in severity. Abdomen remains tender. Blood pressure improved.   CT scan: No evidence of aortic dissection or aneurysm, there  is aortic atherosclerosis present.  There are findings of granulomatous disease. Bilateral renal cysts present.  Bladder stone versus calcification of bladder wall. Enlarged prostate. Degenerative disc disease in the lumbar spine. Benign lipoma in the LEFT tensor fascia lata. Cholelithiasis present.   Discussed with supervising physician Dr. Thomasene Lot who has personally evaluated this patient- recommends RUQ [name]- if no evidence of infection or concerning findings can DC home with general surgery follow up. I am in agreement with this.   I discussed all results with the patient, he is aware of plan.   1600: Patient signed out to [facility] at change of shift pending RUQ abdomen US- if no cholecystitis plan for DC home with Tramadol and general surgery follow up.    Final Clinical Impressions(s) / ED Diagnoses   Final diagnoses:  Abdominal pain    ED Discharge Orders    None       Amaryllis Dyke, PA-C 04/28/18 1600    Macarthur Critchley, MD 04/29/18 [phone]

## 2018-04-28 NOTE — ED Notes (Signed)
Spoke to Dr. Thomasene Lot about pt.  No additional orders at this time.

## 2018-05-08 ENCOUNTER — Other Ambulatory Visit: Payer: Self-pay | Admitting: Surgery

## 2018-05-08 DIAGNOSIS — K8020 Calculus of gallbladder without cholecystitis without obstruction: Secondary | ICD-10-CM | POA: Diagnosis not present

## 2018-05-09 ENCOUNTER — Ambulatory Visit (INDEPENDENT_AMBULATORY_CARE_PROVIDER_SITE_OTHER): Payer: Medicare Other | Admitting: Cardiovascular Disease

## 2018-05-09 ENCOUNTER — Encounter: Payer: Self-pay | Admitting: Cardiovascular Disease

## 2018-05-09 VITALS — BP 126/87 | HR 85 | Ht 70.0 in | Wt 207.0 lb

## 2018-05-09 DIAGNOSIS — G969 Disorder of central nervous system, unspecified: Secondary | ICD-10-CM | POA: Diagnosis not present

## 2018-05-09 DIAGNOSIS — E785 Hyperlipidemia, unspecified: Secondary | ICD-10-CM

## 2018-05-09 DIAGNOSIS — Z01810 Encounter for preprocedural cardiovascular examination: Secondary | ICD-10-CM | POA: Diagnosis not present

## 2018-05-09 DIAGNOSIS — Z950 Presence of cardiac pacemaker: Secondary | ICD-10-CM

## 2018-05-09 DIAGNOSIS — I5032 Chronic diastolic (congestive) heart failure: Secondary | ICD-10-CM | POA: Diagnosis not present

## 2018-05-09 DIAGNOSIS — I700 Atherosclerosis of aorta: Secondary | ICD-10-CM | POA: Diagnosis not present

## 2018-05-09 DIAGNOSIS — I25810 Atherosclerosis of coronary artery bypass graft(s) without angina pectoris: Secondary | ICD-10-CM | POA: Diagnosis not present

## 2018-05-09 DIAGNOSIS — R251 Tremor, unspecified: Secondary | ICD-10-CM

## 2018-05-09 MED ORDER — ATORVASTATIN CALCIUM 20 MG PO TABS: 20.0000 mg | ORAL_TABLET | Freq: Every day | ORAL | 3 refills | Status: DC

## 2018-05-09 NOTE — Progress Notes (Signed)
Cardiology Office Note:    Date:  05/11/2018   ID:  Marcus Fuller, DOB [birth year], MRN [id number]  PCP:  Chipper Herb, MD  Cardiologist:  Amaranta Mehl  Referring MD: Chipper Herb, MD   Chief Complaint  Patient presents with  . Follow-up  CHF, CRT-P   History of Present Illness:    Marcus Fuller is a 77 y.o. male with a remote history of coronary artery disease and stent placement (2007), recently relocated from [place], [place].    He had a dual-chamber biventricular pacemaker implanted in November 2017 (Medtronic Percepta CRT-P).  The device was previously followed by Dr. Arminda Resides, MD, [phone].    Echo performed since his last appointment shows a surprisingly good LVEF at 50-55%.  We do not have details of the LV systolic function before implantation of the biventricular pacemaker.  The patient specifically denies any chest pain at rest exertion, dyspnea at rest or with exertion, orthopnea, paroxysmal nocturnal dyspnea, syncope, palpitations, new focal neurological deficits, intermittent claudication, lower extremity edema, unexplained weight gain, cough, hemoptysis or wheezing.    His biggest complaints is tremor, which has not improved in his opinion with either primidone or carbidopa-levodopa.  He is still able to paint.  His complaints of fatigue improved substantially after we turned on rate response at his last appointment.  He is scheduled to undergo cholecystectomy with Dr. Ninfa Linden on June 3.  He had a visit to the emergency room for symptomatic cholelithiasis without cholecystitis just a few days ago.  Interrogated his pacemaker today. The device was implanted in 2017 and the generator has an estimated 11 years of remaining longevity. Medtronic Percepta CRT-P.    The heart rate histogram is greatly improved after we turned on rate response.  He is 94% atrial pacing and better than 95% biventricular paced he has had very rare episodes of  nonsustained ventricular tachycardia 1 second in duration.  Activity level is around 3 hours a day.  We performed an ECG with the ventricular pacing turned off.  The underlying QRS is left bundle branch block with a duration of about 150 ms.  Effective LV pacing is seen, threshold today 1.25V@0 .4 ms (LV 1-LV 2 configuration).  Past Medical History:  Diagnosis Date  . Cataract    beginning stages  . Parkinson's disease ([place]) 01/29/2018  . Tremor of right hand     Past Surgical History:  Procedure Laterality Date  . APPENDECTOMY    . bbb    . PACEMAKER INSERTION  11/2016  . STENT PLACEMENT VASCULAR ([place] [facility])  2007  . TONSILLECTOMY      Current Medications: Current Meds  Medication Sig  . aspirin EC 81 MG tablet Take 81 mg by mouth daily.  . carbidopa-levodopa (SINEMET IR) 10-100 MG tablet Take 2 tablets by mouth at bedtime.  . primidone (MYSOLINE) 50 MG tablet Take 1 tablet (50 mg total) by mouth at bedtime.  . traMADol (ULTRAM) 50 MG tablet Take 1 tablet (50 mg total) by mouth every 6 (six) hours as needed.  . Vitamin D, Ergocalciferol, (DRISDOL) 50000 units CAPS capsule Take 1 capsule (50,000 Units total) by mouth every 7 (seven) days.     Allergies:   Codeine   Social History   Socioeconomic History  . Marital status: Married    Spouse name: Not on file  . Number of children: 2  . Years of education: 35  . Highest education level: Not on file  Occupational History  .  Not on file  Social Needs  . Financial resource strain: Not on file  . Food insecurity:    Worry: Not on file    Inability: Not on file  . Transportation needs:    Medical: Not on file    Non-medical: Not on file  Tobacco Use  . Smoking status: Never Smoker  . Smokeless tobacco: Never Used  Substance and Sexual Activity  . Alcohol use: Yes    Comment: social   . Drug use: No  . Sexual activity: Not on file  Lifestyle  . Physical activity:    Days per week: Not on file    Minutes per  session: Not on file  . Stress: Not on file  Relationships  . Social connections:    Talks on phone: Not on file    Gets together: Not on file    Attends religious service: Not on file    Active member of club or organization: Not on file    Attends meetings of clubs or organizations: Not on file    Relationship status: Not on file  Other Topics Concern  . Not on file  Social History Narrative   Lives w/ wife   Caffeine use: sometimes   Right handed      Family History: The patient's family history includes Atrial fibrillation in his sister; Cancer in his mother; Diabetes in his mother; Heart attack in his father and paternal grandfather; Heart disease in his brother, father, and mother; Pancreatic disease in his brother; Spina bifida in his brother; Tremor in his brother.  Reports that his father died of a heart attack at age 44.  ROS:   Please see the history of present illness.    All other systems reviewed and are negative.  EKGs/Labs/Other Studies Reviewed:    The following studies were reviewed today: Notes from Dr. Jannifer Franklin and Georgina Pillion Presence of pacemaker check performed in the office by me.  EKG:  EKG is ordered today.  The ekg during BiV pacing shows AV sequential pacing, QRS 129ms with +R in V, QTc 499 ms. During AAI pacing, QRS 148 ms, LBBB.  Recent Labs: 12/05/2017: TSH 4.250 04/28/2018: ALT 30; BUN 14; Creatinine, Ser 1.18; Hemoglobin 15.3; Platelets 166; Potassium 3.9; Sodium 141  Recent Lipid Panel    Component Value Date/Time   CHOL 190 12/05/2017 1633   TRIG 201 (H) 12/05/2017 1633   HDL 32 (L) 12/05/2017 1633   CHOLHDL 5.9 (H) 12/05/2017 1633   LDLCALC 118 (H) 12/05/2017 1633    Physical Exam:    VS:  BP 126/87   Pulse 85   Ht 5\' 10"  (1.778 m)   Wt 207 lb (93.9 kg)   BMI 29.70 kg/m     Wt Readings from Last 3 Encounters:  05/09/18 207 lb (93.9 kg)  02/08/18 206 lb (93.4 kg)  01/29/18 206 lb 8 oz (93.7 kg)     General: Alert,  oriented x3, no distress, overweight Head: no evidence of trauma, PERRL, EOMI, no exophtalmos or lid lag, no myxedema, no xanthelasma; normal ears, nose and oropharynx Neck: normal jugular venous pulsations and no hepatojugular reflux; brisk carotid pulses without delay and no carotid bruits Chest: clear to auscultation, no signs of consolidation by percussion or palpation, normal fremitus, symmetrical and full respiratory excursions Cardiovascular: normal position and quality of the apical impulse, regular rhythm, normal first and left second heart sounds, no murmurs, rubs or gallops Abdomen: no tenderness or distention, no masses by palpation,  no abnormal pulsatility or arterial bruits, normal bowel sounds, no hepatosplenomegaly Extremities: no clubbing, cyanosis or edema; 2+ radial, ulnar and brachial pulses bilaterally; 2+ right femoral, posterior tibial and dorsalis pedis pulses; 2+ left femoral, posterior tibial and dorsalis pedis pulses; no subclavian or femoral bruits Neurological: Tremor in both hands, more prominent in the dominant right hand Psych: Normal mood and affect   ASSESSMENT:    1. Chronic diastolic heart failure ([place])   2. Coronary artery disease involving coronary bypass graft of native heart without angina pectoris   3. Atherosclerosis of aorta ([place])   4. Biventricular cardiac pacemaker in situ   5. Dyslipidemia   6. Tremor due to disorder of central nervous system   7. Preop cardiovascular exam    PLAN:    In order of problems listed above:  1. CHF: no records regarding LV function form his previous Cardiologist. Have requested records again. Currently LVEF is essentially normal and he prefers to avoid any additional medications.  No firm indication for RAA S inhibitors or beta-blockers at this time.. 2. CAD: reportedly received a stent at [facility] in 2007.  There is evidence of substantial coronary atherosclerosis and atherosclerotic calcification of the aorta on the CT  scan that was performed for his cholelithiasis.  He should be taking a statin.  He is on aspirin. 3. Ao atherosclerosis: Normal caliber of the aorta and no symptoms 4. CRT-P: Normal device function.  Improved symptoms after turning on rate response.  Effective biV pacing greater than 95% of the time.  Enrolled him in for remote pacemaker clinic.  No additional changes made to his device today. 5. HLP: Start statin to target LDL  70 or less.   6. Tremor: He seems disenchanted with his current treatment for tremor.  There is always an option to start him off beta-blockers, but again he would rather not take any medications at this time.  With upcoming surgery, will avoid any changes in his medications. 7. Preop CV eval: With a moderately active lifestyle he does not have dyspnea or angina.  He has normal left ventricular systolic function.  He has not had any recent cardiovascular events.  I believe he is at low risk for major complications with the planned cholecystectomy.  We will send a letter to Dr. Ninfa Linden.  Cholecystectomy should not interfere with normal function of this pacemaker.  He is not pacemaker dependent.   Medication Adjustments/Labs and Tests Ordered: Current medicines are reviewed at length with the patient today.  Concerns regarding medicines are outlined above.  Orders Placed This Encounter  Procedures  . Lipid panel  . EKG 12-Lead   Meds ordered this encounter  Medications  . atorvastatin (LIPITOR) 20 MG tablet    Sig: Take 1 tablet (20 mg total) by mouth daily.    Dispense:  90 tablet    Refill:  3    Signed, Sanda Klein, MD  05/11/2018 9:08 AM    [place]

## 2018-05-09 NOTE — Patient Instructions (Signed)
Dr Sallyanne Kuster has recommended making the following medication changes: 1. START Atorvastatin 20 mg - take 1 tablet daily  Your physician recommends that you return for lab work in 3 months - FASTING.  Remote monitoring is used to monitor your Pacemaker or ICD from home. This monitoring reduces the number of office visits required to check your device to one time per year. It allows [name] to keep an eye on the functioning of your device to ensure it is working properly. You are scheduled for a device check from home on Thursday, June 20th, 2019. You may send your transmission at any time that day. If you have a wireless device, the transmission will be sent automatically. After your physician reviews your transmission, you will receive a notification with your next transmission date.  To improve our patient care and to more adequately follow your device, [facility] has decided, as a practice, to start following each patient four times a year with your home monitor. This means that you may experience a remote appointment that is close to an in-office appointment with your physician. Your insurance will apply at the same rate as other remote monitoring transmissions.  Dr Sallyanne Kuster recommends that you schedule a follow-up appointment in 12 months with a pacemaker check. You will receive a reminder letter in the mail two months in advance. If you don't receive a letter, please call our office to schedule the follow-up appointment.  If you need a refill on your cardiac medications before your next appointment, please call your pharmacy.

## 2018-05-11 DIAGNOSIS — I700 Atherosclerosis of aorta: Secondary | ICD-10-CM | POA: Insufficient documentation

## 2018-05-11 DIAGNOSIS — I2510 Atherosclerotic heart disease of native coronary artery without angina pectoris: Secondary | ICD-10-CM | POA: Insufficient documentation

## 2018-05-11 DIAGNOSIS — I25810 Atherosclerosis of coronary artery bypass graft(s) without angina pectoris: Secondary | ICD-10-CM | POA: Insufficient documentation

## 2018-05-18 ENCOUNTER — Other Ambulatory Visit: Payer: Self-pay

## 2018-05-18 ENCOUNTER — Encounter (HOSPITAL_COMMUNITY): Payer: Self-pay | Admitting: *Deleted

## 2018-05-20 NOTE — H&P (Signed)
Marcus Fuller Documented: 05/08/2018 9:29 AM Location: [facility] Patient #: [id number] DOB: 1941/07/12 Married / Language: Marcus Fuller / Race: White Male   History of Present Illness Marcus Canary A. Ninfa Linden MD; 05/08/2018 9:49 AM) The patient is a 77 year old male who presents for evaluation of gall stones. This is a pleasant patient performed by Dr. Gerald Leitz for evaluation of symptomatic cholelithiasis. The patient had emergency department several weeks ago with right upper quadrant abdominal pain herniated into the chest. He had a thorough workup. He was found to have gallstones on ultrasound. CT scan was otherwise unremarkable. Liver function tests were normal white blood count was normal. Since that time, he still has some mild abdominal pain which is only intermittent. At the time of the attack again the pain was sharp and severe. Bowel movements are normal. He is otherwise without complaints.   Past Surgical History Marcus Fuller, CMA; 05/08/2018 9:29 AM) Appendectomy  Knee Surgery  Left. Tonsillectomy   Diagnostic Studies History Marcus Fuller, CMA; 05/08/2018 9:29 AM) Colonoscopy  never  Allergies Marcus Fuller, CMA; 05/08/2018 9:30 AM) No Known Drug Allergies [05/08/2018]:  Medication History Marcus Fuller, CMA; 05/08/2018 9:31 AM) Carbidopa-Levodopa (10-100MG  Tablet, Oral) Active. Primidone (50MG  Tablet, Oral) Active. Vitamin D (Ergocalciferol) (50000UNIT Capsule, Oral) Active. Aspirin (81MG  Tablet, Oral) Active. TraMADol HCl (50MG  Tablet, Oral) Active. Medications Reconciled  Social History Marcus Fuller, CMA; 05/08/2018 9:29 AM) Alcohol use  Occasional alcohol use. Caffeine use  Coffee. No drug use  Tobacco use  Never smoker.  Family History Marcus Fuller, CMA; 05/08/2018 9:29 AM) Diabetes Mellitus  Mother. Heart Disease  Brother, Father, Mother, Sister. Heart disease in male family member before age 53   Other Problems  Marcus Fuller, CMA; 05/08/2018 9:29 AM) Arthritis  Back Pain  Cholelithiasis  Kidney Stone     Review of Systems Marcus Fuller CMA; 05/08/2018 9:29 AM) General Present- Appetite Loss and Fatigue. Not Present- Chills, Fever, Night Sweats, Weight Gain and Weight Loss. Skin Not Present- Change in Wart/Mole, Dryness, Hives, Jaundice, New Lesions, Non-Healing Wounds, Rash and Ulcer. HEENT Present- Hearing Loss and Wears glasses/contact lenses. Not Present- Earache, Hoarseness, Nose Bleed, Oral Ulcers, Ringing in the Ears, Seasonal Allergies, Sinus Pain, Sore Throat, Visual Disturbances and Yellow Eyes. Respiratory Not Present- Bloody sputum, Chronic Cough, Difficulty Breathing, Snoring and Wheezing. Breast Not Present- Breast Mass, Breast Pain, Nipple Discharge and Skin Changes. Cardiovascular Not Present- Chest Pain, Difficulty Breathing Lying Down, Leg Cramps, Palpitations, Rapid Heart Rate, Shortness of Breath and Swelling of Extremities. Gastrointestinal Present- Abdominal Pain, Constipation and Gets full quickly at meals. Not Present- Bloating, Bloody Stool, Change in Bowel Habits, Chronic diarrhea, Difficulty Swallowing, Excessive gas, Hemorrhoids, Indigestion, Nausea, Rectal Pain and Vomiting. Male Genitourinary Present- Frequency, Impotence and Urgency. Not Present- Blood in Urine, Change in Urinary Stream, Nocturia, Painful Urination and Urine Leakage. Musculoskeletal Present- Back Pain, Joint Stiffness and Muscle Weakness. Not Present- Joint Pain, Muscle Pain and Swelling of Extremities. Neurological Present- Tingling, Tremor, Trouble walking and Weakness. Not Present- Decreased Memory, Fainting, Headaches, Numbness and Seizures. Psychiatric Present- Anxiety, Depression and Fearful. Not Present- Bipolar, Change in Sleep Pattern and Frequent crying. Endocrine Present- Cold Intolerance. Not Present- Excessive Hunger, Hair Changes, Heat Intolerance, Hot flashes and New Diabetes. Hematology  Present- Easy Bruising. Not Present- Blood Thinners, Excessive bleeding, Gland problems, HIV and Persistent Infections.  Vitals (Marcus Fuller CMA; 05/08/2018 9:30 AM) 05/08/2018 9:30 AM Weight: 206.8 lb Height: 70in Body Surface Area: 2.12 m Body Mass Index: 29.67 kg/m  Temp.: 97.53F(Oral)  Pulse: 83 (Regular)  BP: 110/74 (Sitting, Left Arm, Standard)       Physical Exam (Antionetta Ator A. Ninfa Linden MD; 05/08/2018 9:49 AM) General Mental Status-Alert. General Appearance-Consistent with stated age. Hydration-Well hydrated. Voice-Normal.  Head and Neck Head-normocephalic, atraumatic with no lesions or palpable masses.  Eye Eyeball - Bilateral-Extraocular movements intact. Sclera/Conjunctiva - Bilateral-No scleral icterus.  Chest and Lung Exam Chest and lung exam reveals -quiet, even and easy respiratory effort with no use of accessory muscles and on auscultation, normal breath sounds, no adventitious sounds and normal vocal resonance. Inspection Chest Wall - Normal. Back - normal.  Cardiovascular Cardiovascular examination reveals -on palpation PMI is normal in location and amplitude, no palpable S3 or S4. Normal cardiac borders., normal heart sounds, regular rate and rhythm with no murmurs, carotid auscultation reveals no bruits and normal pedal pulses bilaterally.  Abdomen Inspection Inspection of the abdomen reveals - No Hernias. Skin - Scar - no surgical scars. Palpation/Percussion Palpation and Percussion of the abdomen reveal - Soft, Non Tender, No Rebound tenderness, No Rigidity (guarding) and No hepatosplenomegaly. Auscultation Auscultation of the abdomen reveals - Bowel sounds normal.  Neurologic - Did not examine.  Musculoskeletal - Did not examine.    Assessment & Plan (Rosabelle Jupin A. Ninfa Linden MD; 05/08/2018 9:50 AM) SYMPTOMATIC CHOLELITHIASIS (K80.20) Impression: I have reviewed the patient's ultrasound, CAT scan, and laboratory data. He  does have symptomatic cholelithiasis. Laparoscopic cholecystectomy is recommended. I discussed this with the patient and his wife. I gave him literature regarding surgery. We discussed the surgical procedure in detail. We discussed the risks which includes but is not limited to bleeding, infection, injury to surrounding structures, the need to convert to an open procedure, bile duct injury, bile leak, cardiopulmonary issues, postop recovery, etc. They understand and wish to proceed with surgery which will be scheduled

## 2018-05-20 NOTE — Anesthesia Preprocedure Evaluation (Addendum)
Anesthesia Evaluation  Patient identified by MRN, date of birth, ID band Patient awake    Reviewed: Allergy & Precautions, H&P , NPO status , Patient's Chart, lab work & pertinent test results, reviewed documented beta blocker date and time   Airway Mallampati: II  TM Distance: >3 FB Neck ROM: full    Dental no notable dental hx. (+) Edentulous Upper, Upper Dentures, Poor Dentition, Missing   Pulmonary neg pulmonary ROS,    Pulmonary exam normal breath sounds clear to auscultation       Cardiovascular Exercise Tolerance: Good hypertension, Pt. on medications + CAD and + Peripheral Vascular Disease  + pacemaker  Rhythm:regular Rate:Normal     Neuro/Psych Anxiety Parkinson's disease   Neuromuscular disease negative psych ROS   GI/Hepatic negative GI ROS, Neg liver ROS,   Endo/Other  negative endocrine ROS  Renal/GU negative Renal ROS  negative genitourinary   Musculoskeletal  (+) Arthritis , Osteoarthritis,    Abdominal   Peds  Hematology negative hematology ROS (+)   Anesthesia Other Findings CT ANGIO 5/19 IMPRESSION: 1. No evidence for aortic dissection or aneurysm. 2.  Aortic atherosclerosis.  (ICD10-I70.0) 3. Small to moderate hiatal hernia. 4. Cholelithiasis. 5. Splenic and pulmonary granulomata consistent with remote granulomatous disease 6. Bilateral renal cysts. 7. Bladder stone versus calcification of bladder wall. Delayed noncontrast prone images of the pelvis could be performed as needed to determine presence/absence of a bladder stone. 8. Enlarged prostate. 9. Degenerative disc disease in the lumbar spine. 10. Benign lipoma in the LEFT tensor fascia lata.  Reproductive/Obstetrics negative OB ROS                            Anesthesia Physical Anesthesia Plan  ASA: III  Anesthesia Plan: General   Post-op Pain Management:    Induction: Intravenous  PONV Risk Score  and Plan: 2 and Ondansetron and Treatment may vary due to age or medical condition  Airway Management Planned: Oral ETT and LMA  Additional Equipment:   Intra-op Plan:   Post-operative Plan: Extubation in OR  Informed Consent: I have reviewed the patients History and Physical, chart, labs and discussed the procedure including the risks, benefits and alternatives for the proposed anesthesia with the patient or authorized representative who has indicated his/her understanding and acceptance.   Dental Advisory Given  Plan Discussed with: CRNA, Anesthesiologist and Surgeon  Anesthesia Plan Comments: (  )        Anesthesia Quick Evaluation

## 2018-05-21 ENCOUNTER — Ambulatory Visit (HOSPITAL_COMMUNITY): Payer: Medicare Other | Admitting: Anesthesiology

## 2018-05-21 ENCOUNTER — Encounter (HOSPITAL_COMMUNITY): Payer: Self-pay | Admitting: Certified Registered"

## 2018-05-21 ENCOUNTER — Other Ambulatory Visit: Payer: Self-pay

## 2018-05-21 ENCOUNTER — Encounter (HOSPITAL_COMMUNITY)
Admission: RE | Disposition: A | Discharge status: Home / Self Care | Payer: Self-pay | Source: Ambulatory Visit | Attending: Surgery

## 2018-05-21 ENCOUNTER — Observation Stay (HOSPITAL_COMMUNITY)
Admission: RE | Admit: 2018-05-21 | Discharge: 2018-05-22 | Disposition: A | Discharge status: Home / Self Care | Payer: Medicare Other | Source: Ambulatory Visit | Attending: Surgery | Admitting: Surgery

## 2018-05-21 DIAGNOSIS — I2510 Atherosclerotic heart disease of native coronary artery without angina pectoris: Secondary | ICD-10-CM | POA: Insufficient documentation

## 2018-05-21 DIAGNOSIS — Z79899 Other long term (current) drug therapy: Secondary | ICD-10-CM | POA: Diagnosis not present

## 2018-05-21 DIAGNOSIS — I739 Peripheral vascular disease, unspecified: Secondary | ICD-10-CM | POA: Insufficient documentation

## 2018-05-21 DIAGNOSIS — K8020 Calculus of gallbladder without cholecystitis without obstruction: Secondary | ICD-10-CM | POA: Diagnosis present

## 2018-05-21 DIAGNOSIS — G20 Parkinson's disease: Secondary | ICD-10-CM | POA: Insufficient documentation

## 2018-05-21 DIAGNOSIS — Z9049 Acquired absence of other specified parts of digestive tract: Secondary | ICD-10-CM

## 2018-05-21 DIAGNOSIS — I10 Essential (primary) hypertension: Secondary | ICD-10-CM | POA: Diagnosis not present

## 2018-05-21 DIAGNOSIS — E782 Mixed hyperlipidemia: Secondary | ICD-10-CM | POA: Diagnosis not present

## 2018-05-21 DIAGNOSIS — I25810 Atherosclerosis of coronary artery bypass graft(s) without angina pectoris: Secondary | ICD-10-CM | POA: Diagnosis not present

## 2018-05-21 DIAGNOSIS — K8010 Calculus of gallbladder with chronic cholecystitis without obstruction: Principal | ICD-10-CM | POA: Insufficient documentation

## 2018-05-21 DIAGNOSIS — Z7982 Long term (current) use of aspirin: Secondary | ICD-10-CM | POA: Insufficient documentation

## 2018-05-21 DIAGNOSIS — Z950 Presence of cardiac pacemaker: Secondary | ICD-10-CM | POA: Diagnosis not present

## 2018-05-21 HISTORY — DX: Anxiety disorder, unspecified: F41.9

## 2018-05-21 HISTORY — DX: Presence of cardiac pacemaker: Z95.0

## 2018-05-21 HISTORY — PX: CHOLECYSTECTOMY: SHX55

## 2018-05-21 HISTORY — DX: Personal history of urinary calculi: Z87.442

## 2018-05-21 HISTORY — DX: Essential (primary) hypertension: I10

## 2018-05-21 HISTORY — DX: Atherosclerotic heart disease of native coronary artery without angina pectoris: I25.10

## 2018-05-21 SURGERY — LAPAROSCOPIC CHOLECYSTECTOMY
Anesthesia: General

## 2018-05-21 MED ORDER — MEPERIDINE HCL 50 MG/ML IJ SOLN: 6.2500 mg | INTRAMUSCULAR | Status: DC | PRN

## 2018-05-21 MED ORDER — PROPOFOL 10 MG/ML IV BOLUS
INTRAVENOUS | Status: DC | PRN
  Administered 2018-05-21: 180 mg via INTRAVENOUS

## 2018-05-21 MED ORDER — FENTANYL CITRATE (PF) 250 MCG/5ML IJ SOLN
INTRAMUSCULAR | Status: AC
  Filled 2018-05-21: qty 5

## 2018-05-21 MED ORDER — CHLORHEXIDINE GLUCONATE CLOTH 2 % EX PADS: 6.0000 | MEDICATED_PAD | Freq: Once | CUTANEOUS | Status: DC

## 2018-05-21 MED ORDER — MORPHINE SULFATE (PF) 4 MG/ML IV SOLN
INTRAVENOUS | Status: AC
  Filled 2018-05-21: qty 1

## 2018-05-21 MED ORDER — BUPIVACAINE HCL (PF) 0.5 % IJ SOLN
INTRAMUSCULAR | Status: DC | PRN
  Administered 2018-05-21: 20 mL

## 2018-05-21 MED ORDER — DEXMEDETOMIDINE HCL IN NACL 200 MCG/50ML IV SOLN
INTRAVENOUS | Status: AC
  Filled 2018-05-21: qty 50

## 2018-05-21 MED ORDER — MORPHINE SULFATE (PF) 4 MG/ML IV SOLN
1.0000 mg | INTRAVENOUS | Status: DC | PRN
  Administered 2018-05-21: 2 mg via INTRAVENOUS
  Administered 2018-05-21: 3 mg via INTRAVENOUS
  Administered 2018-05-22 (×2): 2 mg via INTRAVENOUS
  Filled 2018-05-21 (×3): qty 1

## 2018-05-21 MED ORDER — SUGAMMADEX SODIUM 200 MG/2ML IV SOLN
INTRAVENOUS | Status: DC | PRN
  Administered 2018-05-21: 200 mg via INTRAVENOUS

## 2018-05-21 MED ORDER — PROPOFOL 10 MG/ML IV BOLUS
INTRAVENOUS | Status: AC
  Filled 2018-05-21: qty 20

## 2018-05-21 MED ORDER — SODIUM CHLORIDE 0.9 % IV SOLN
INTRAVENOUS | Status: DC
  Administered 2018-05-21: 21:00:00 via INTRAVENOUS

## 2018-05-21 MED ORDER — DEXAMETHASONE SODIUM PHOSPHATE 10 MG/ML IJ SOLN
INTRAMUSCULAR | Status: DC | PRN
  Administered 2018-05-21: 10 mg via INTRAVENOUS

## 2018-05-21 MED ORDER — FENTANYL CITRATE (PF) 100 MCG/2ML IJ SOLN
INTRAMUSCULAR | Status: AC
  Administered 2018-05-21: 25 ug via INTRAVENOUS
  Filled 2018-05-21: qty 2

## 2018-05-21 MED ORDER — CEFAZOLIN SODIUM-DEXTROSE 2-4 GM/100ML-% IV SOLN
2.0000 g | INTRAVENOUS | Status: AC
  Administered 2018-05-21: 2 g via INTRAVENOUS
  Filled 2018-05-21: qty 100

## 2018-05-21 MED ORDER — FENTANYL CITRATE (PF) 100 MCG/2ML IJ SOLN
INTRAMUSCULAR | Status: AC
  Administered 2018-05-21: 50 ug via INTRAVENOUS
  Filled 2018-05-21: qty 2

## 2018-05-21 MED ORDER — LIDOCAINE 2% (20 MG/ML) 5 ML SYRINGE
INTRAMUSCULAR | Status: AC
  Filled 2018-05-21: qty 5

## 2018-05-21 MED ORDER — ROCURONIUM BROMIDE 10 MG/ML (PF) SYRINGE
PREFILLED_SYRINGE | INTRAVENOUS | Status: DC | PRN
  Administered 2018-05-21: 40 mg via INTRAVENOUS

## 2018-05-21 MED ORDER — PRIMIDONE 50 MG PO TABS
50.0000 mg | ORAL_TABLET | Freq: Every day | ORAL | Status: DC
  Administered 2018-05-21: 50 mg via ORAL
  Filled 2018-05-21: qty 1

## 2018-05-21 MED ORDER — DIPHENHYDRAMINE HCL 50 MG/ML IJ SOLN: 12.5000 mg | Freq: Four times a day (QID) | INTRAMUSCULAR | Status: DC | PRN

## 2018-05-21 MED ORDER — DEXMEDETOMIDINE HCL IN NACL 200 MCG/50ML IV SOLN
INTRAVENOUS | Status: DC | PRN
  Administered 2018-05-21 (×2): 8 ug via INTRAVENOUS
  Administered 2018-05-21: 20 ug via INTRAVENOUS

## 2018-05-21 MED ORDER — LACTATED RINGERS IV SOLN
INTRAVENOUS | Status: DC | PRN
  Administered 2018-05-21 (×2): via INTRAVENOUS

## 2018-05-21 MED ORDER — FENTANYL CITRATE (PF) 100 MCG/2ML IJ SOLN
25.0000 ug | INTRAMUSCULAR | Status: DC | PRN
  Administered 2018-05-21 (×2): 25 ug via INTRAVENOUS
  Administered 2018-05-21 (×2): 50 ug via INTRAVENOUS

## 2018-05-21 MED ORDER — DEXMEDETOMIDINE HCL 200 MCG/2ML IV SOLN
20.0000 ug | Freq: Once | INTRAVENOUS | Status: AC
  Administered 2018-05-21: 20 ug via INTRAVENOUS

## 2018-05-21 MED ORDER — HYDRALAZINE HCL 20 MG/ML IJ SOLN
20.0000 mg | Freq: Once | INTRAMUSCULAR | Status: AC
  Administered 2018-05-21: 20 mg via INTRAVENOUS
  Filled 2018-05-21: qty 1

## 2018-05-21 MED ORDER — DEXAMETHASONE SODIUM PHOSPHATE 10 MG/ML IJ SOLN
INTRAMUSCULAR | Status: AC
  Filled 2018-05-21: qty 1

## 2018-05-21 MED ORDER — GABAPENTIN 300 MG PO CAPS
300.0000 mg | ORAL_CAPSULE | ORAL | Status: AC
  Administered 2018-05-21: 300 mg via ORAL
  Filled 2018-05-21: qty 1

## 2018-05-21 MED ORDER — ENOXAPARIN SODIUM 40 MG/0.4ML ~~LOC~~ SOLN
40.0000 mg | SUBCUTANEOUS | Status: DC
  Administered 2018-05-22: 40 mg via SUBCUTANEOUS
  Filled 2018-05-21: qty 0.4

## 2018-05-21 MED ORDER — BUPIVACAINE HCL (PF) 0.5 % IJ SOLN
INTRAMUSCULAR | Status: AC
  Filled 2018-05-21: qty 30

## 2018-05-21 MED ORDER — ONDANSETRON HCL 4 MG/2ML IJ SOLN: 4.0000 mg | Freq: Four times a day (QID) | INTRAMUSCULAR | Status: DC | PRN

## 2018-05-21 MED ORDER — ONDANSETRON 4 MG PO TBDP: 4.0000 mg | ORAL_TABLET | Freq: Four times a day (QID) | ORAL | Status: DC | PRN

## 2018-05-21 MED ORDER — ROCURONIUM BROMIDE 10 MG/ML (PF) SYRINGE
PREFILLED_SYRINGE | INTRAVENOUS | Status: AC
Start: 2018-05-21 — End: ?
  Filled 2018-05-21: qty 5

## 2018-05-21 MED ORDER — LIDOCAINE 2% (20 MG/ML) 5 ML SYRINGE
INTRAMUSCULAR | Status: DC | PRN
  Administered 2018-05-21: 100 mg via INTRAVENOUS

## 2018-05-21 MED ORDER — FENTANYL CITRATE (PF) 100 MCG/2ML IJ SOLN
INTRAMUSCULAR | Status: DC | PRN
  Administered 2018-05-21: 100 ug via INTRAVENOUS
  Administered 2018-05-21 (×3): 50 ug via INTRAVENOUS

## 2018-05-21 MED ORDER — ACETAMINOPHEN 500 MG PO TABS
1000.0000 mg | ORAL_TABLET | ORAL | Status: AC
  Administered 2018-05-21: 1000 mg via ORAL
  Filled 2018-05-21: qty 2

## 2018-05-21 MED ORDER — METHOCARBAMOL 500 MG PO TABS
500.0000 mg | ORAL_TABLET | Freq: Four times a day (QID) | ORAL | Status: DC | PRN
  Administered 2018-05-21 – 2018-05-22 (×2): 500 mg via ORAL
  Filled 2018-05-21 (×2): qty 1

## 2018-05-21 MED ORDER — LACTATED RINGERS IR SOLN
Status: DC | PRN
  Administered 2018-05-21: 1000 mL

## 2018-05-21 MED ORDER — ONDANSETRON HCL 4 MG/2ML IJ SOLN
INTRAMUSCULAR | Status: AC
  Filled 2018-05-21: qty 2

## 2018-05-21 MED ORDER — DIPHENHYDRAMINE HCL 12.5 MG/5ML PO ELIX: 12.5000 mg | ORAL_SOLUTION | Freq: Four times a day (QID) | ORAL | Status: DC | PRN

## 2018-05-21 MED ORDER — ONDANSETRON HCL 4 MG/2ML IJ SOLN
INTRAMUSCULAR | Status: DC | PRN
  Administered 2018-05-21: 4 mg via INTRAVENOUS

## 2018-05-21 MED ORDER — TRAMADOL HCL 50 MG PO TABS
50.0000 mg | ORAL_TABLET | Freq: Four times a day (QID) | ORAL | Status: DC | PRN
  Administered 2018-05-21: 50 mg via ORAL
  Filled 2018-05-21 (×2): qty 1

## 2018-05-21 SURGICAL SUPPLY — 31 items
ADH SKN CLS APL DERMABOND .7 (GAUZE/BANDAGES/DRESSINGS) ×1
APPLIER CLIP 5 13 M/L LIGAMAX5 (MISCELLANEOUS) ×2
APR CLP MED LRG 5 ANG JAW (MISCELLANEOUS) ×1
BAG SPEC RTRVL LRG 6X4 10 (ENDOMECHANICALS) ×1
CABLE HIGH FREQUENCY MONO STRZ (ELECTRODE) ×2 IMPLANT
CHLORAPREP W/TINT 26ML (MISCELLANEOUS) ×2 IMPLANT
CLIP APPLIE 5 13 M/L LIGAMAX5 (MISCELLANEOUS) ×1 IMPLANT
COVER MAYO STAND STRL (DRAPES) IMPLANT
DECANTER SPIKE VIAL GLASS SM (MISCELLANEOUS) ×2 IMPLANT
DERMABOND ADVANCED (GAUZE/BANDAGES/DRESSINGS) ×1
DERMABOND ADVANCED .7 DNX12 (GAUZE/BANDAGES/DRESSINGS) ×1 IMPLANT
DRAPE C-ARM 42X120 X-RAY (DRAPES) IMPLANT
DRAPE LAPAROSCOPIC ABDOMINAL (DRAPES) ×1 IMPLANT
ELECT REM PT RETURN 15FT ADLT (MISCELLANEOUS) ×2 IMPLANT
GLOVE SURG SIGNA 7.5 PF LTX (GLOVE) ×7 IMPLANT
GOWN STRL REUS W/TWL XL LVL3 (GOWN DISPOSABLE) ×5 IMPLANT
HEMOSTAT SNOW SURGICEL 2X4 (HEMOSTASIS) ×1 IMPLANT
HEMOSTAT SURGICEL 4X8 (HEMOSTASIS) IMPLANT
KIT BASIN OR (CUSTOM PROCEDURE TRAY) ×2 IMPLANT
POUCH SPECIMEN RETRIEVAL 10MM (ENDOMECHANICALS) ×2 IMPLANT
SCISSORS LAP 5X35 DISP (ENDOMECHANICALS) ×2 IMPLANT
SET CHOLANGIOGRAPH MIX (MISCELLANEOUS) IMPLANT
SET IRRIG TUBING LAPAROSCOPIC (IRRIGATION / IRRIGATOR) ×2 IMPLANT
SLEEVE XCEL OPT CAN 5 100 (ENDOMECHANICALS) ×4 IMPLANT
SUT MNCRL AB 4-0 PS2 18 (SUTURE) ×3 IMPLANT
TOWEL OR 17X26 10 PK STRL BLUE (TOWEL DISPOSABLE) ×2 IMPLANT
TOWEL OR NON WOVEN STRL DISP B (DISPOSABLE) ×2 IMPLANT
TRAY LAPAROSCOPIC (CUSTOM PROCEDURE TRAY) ×2 IMPLANT
TROCAR BLADELESS OPT 5 100 (ENDOMECHANICALS) ×2 IMPLANT
TROCAR XCEL BLUNT TIP 100MML (ENDOMECHANICALS) ×2 IMPLANT
TUBING INSUF HEATED (TUBING) ×2 IMPLANT

## 2018-05-21 NOTE — Op Note (Signed)

## 2018-05-21 NOTE — Anesthesia Postprocedure Evaluation (Signed)
Anesthesia Post Note  Patient: Marcus Fuller  Procedure(s) Performed: LAPAROSCOPIC CHOLECYSTECTOMY (N/A )     Patient location during evaluation: PACU Anesthesia Type: General Level of consciousness: awake and alert Pain management: pain level controlled Vital Signs Assessment: post-procedure vital signs reviewed and stable Respiratory status: spontaneous breathing, nonlabored ventilation, respiratory function stable and patient connected to nasal cannula oxygen Cardiovascular status: blood pressure returned to baseline and stable Postop Assessment: no apparent nausea or vomiting Anesthetic complications: no    Last Vitals:  Vitals:   05/21/18 1108 05/21/18 1115  BP: (!) 170/94 (!) 172/97  Pulse:  86  Resp:  15  Temp:    SpO2:  97%    Last Pain:  Vitals:   05/21/18 1115  TempSrc:   PainSc: 5                  [name]

## 2018-05-21 NOTE — Interval H&P Note (Signed)
History and Physical Interval Note: no change in H and P  05/21/2018 9:06 AM  Marcus Fuller  has presented today for surgery, with the diagnosis of Symptomatic cholelithiasis  The various methods of treatment have been discussed with the patient and family. After consideration of risks, benefits and other options for treatment, the patient has consented to  Procedure(s): LAPAROSCOPIC CHOLECYSTECTOMY (N/A) as a surgical intervention .  The patient's history has been reviewed, patient examined, no change in status, stable for surgery.  I have reviewed the patient's chart and labs.  Questions were answered to the patient's satisfaction.     [name]

## 2018-05-21 NOTE — Anesthesia Procedure Notes (Signed)
Procedure Name: Intubation Date/Time: 05/21/2018 10:58 AM Performed by: Stormi Vandevelde D, CRNA Pre-anesthesia Checklist: Patient identified, Emergency Drugs available, Suction available and Patient being monitored Patient Re-evaluated:Patient Re-evaluated prior to induction Oxygen Delivery Method: Circle system utilized Preoxygenation: Pre-oxygenation with 100% oxygen Induction Type: IV induction Ventilation: Mask ventilation without difficulty Laryngoscope Size: Mac and 4 Grade View: Grade I Tube type: Oral Number of attempts: 1 Airway Equipment and Method: Stylet Placement Confirmation: ETT inserted through vocal cords under direct vision,  positive ETCO2 and breath sounds checked- equal and bilateral Secured at: 22 cm Tube secured with: Tape Dental Injury: Teeth and Oropharynx as per pre-operative assessment

## 2018-05-21 NOTE — Progress Notes (Signed)
Wife is at the bedside and pt awaiting Dr. Trevor Mace orders. Will continue to monitor and tx pt according to MD orders. Pt has no s/s of distress. VS and orders assessed.

## 2018-05-21 NOTE — Discharge Instructions (Signed)
[facility] ______[facility] LAPAROSCOPIC SURGERY: POST OP INSTRUCTIONS Always review your discharge instruction sheet given to you by the facility where your surgery was performed. IF YOU HAVE DISABILITY OR FAMILY LEAVE FORMS, YOU MUST BRING THEM TO THE OFFICE FOR PROCESSING.   DO NOT GIVE THEM TO YOUR DOCTOR.  1. A prescription for pain medication may be given to you upon discharge.  Take your pain medication as prescribed, if needed.  If narcotic pain medicine is not needed, then you may take acetaminophen (Tylenol) or ibuprofen (Advil) as needed. 2. Take your usually prescribed medications unless otherwise directed. 3. If you need a refill on your pain medication, please contact your pharmacy.  They will contact our office to request authorization. Prescriptions will not be filled after 5pm or on week-ends. 4. You should follow a light diet the first few days after arrival home, such as soup and crackers, etc.  Be sure to include lots of fluids daily. 5. Most patients will experience some swelling and bruising in the area of the incisions.  Ice packs will help.  Swelling and bruising can take several days to resolve.  6. It is common to experience some constipation if taking pain medication after surgery.  Increasing fluid intake and taking a stool softener (such as Colace) will usually help or prevent this problem from occurring.  A mild laxative (Milk of Magnesia or Miralax) should be taken according to package instructions if there are no bowel movements after 48 hours. 7. Unless discharge instructions indicate otherwise, you may remove your bandages 24-48 hours after surgery, and you may shower at that time.  You may have steri-strips (small skin tapes) in place directly over the incision.  These strips should be left on the skin for 7-10 days.  If your surgeon used skin glue on the incision, you may shower in 24 hours.  The glue will flake off over the next 2-3 weeks.  Any sutures or  staples will be removed at the office during your follow-up visit. 8. ACTIVITIES:  You may resume regular (light) daily activities beginning the next day--such as daily self-care, walking, climbing stairs--gradually increasing activities as tolerated.  You may have sexual intercourse when it is comfortable.  Refrain from any heavy lifting or straining until approved by your doctor. a. You may drive when you are no longer taking prescription pain medication, you can comfortably wear a seatbelt, and you can safely maneuver your car and apply brakes. b. RETURN TO WORK:  __________________________________________________________ 9. You should see your doctor in the office for a follow-up appointment approximately 2-3 weeks after your surgery.  Make sure that you call for this appointment within a day or two after you arrive home to insure a convenient appointment time. 10. OTHER INSTRUCTIONS:OK TO SHOWER STARTING TOMORROW 11. ICE PACK, TYLENOL, IBUPROFEN ALSO FOR PAIN 12. NO LIFTING MORE THAN 15 TO 20 POUNDS FOR 2 WEEKS __________________________________________________________________________________________________________________________ __________________________________________________________________________________________________________________________ WHEN TO CALL YOUR DOCTOR: 1. Fever over 101.0 2. Inability to urinate 3. Continued bleeding from incision. 4. Increased pain, redness, or drainage from the incision. 5. Increasing abdominal pain  The clinic staff is available to answer your questions during regular business hours.  Please dont hesitate to call and ask to speak to one of the nurses for clinical concerns.  If you have a medical emergency, go to the nearest emergency room or call 911.  A surgeon from [facility] is always on call at the hospital. [address], [employer]  [address], [place], [place]  [address] ? [address], [place], [place]   [address] [phone] ?  [phone] ? FAX [phone] Web site: [email or web address]

## 2018-05-21 NOTE — Transfer of Care (Signed)
Immediate Anesthesia Transfer of Care Note  Patient: Marcus Fuller  Procedure(s) Performed: LAPAROSCOPIC CHOLECYSTECTOMY (N/A )  Patient Location: PACU  Anesthesia Type:General  Level of Consciousness: awake, alert  and oriented  Airway & Oxygen Therapy: Patient Spontanous Breathing and Patient connected to face mask oxygen  Post-op Assessment: Report given to RN and Post -op Vital signs reviewed and stable  Post vital signs: Reviewed and stable  Last Vitals:  Vitals Value Taken Time  BP    Temp    Pulse    Resp    SpO2      Last Pain:  Vitals:   05/21/18 0735  TempSrc:   PainSc: 0-No pain         Complications: No apparent anesthesia complications

## 2018-05-21 NOTE — Progress Notes (Signed)
Dr. Verita Lamb, Marcus Fuller is at the bedside to assess pt low O2 Sat on 5 liters nasal canula and reports to call Dr. Ninfa Linden to see if pt can stay overnight for O2 monitoring. Pt is alert, calm, and talkative and has no s/s of distress. VS and orders assessed and will continue to monitor and tx pt according to MD orders.

## 2018-05-21 NOTE — Progress Notes (Signed)
Patient arrived to 1522. Alert and oriented. Ambulatory. O2 @ 2LPM.  Port sites with glue; umbilicus port site with some bright bloody drainage; gauze dressing applied and taped. Report given to [place], Therapist, sports.  Donne Hazel, RN

## 2018-05-21 NOTE — Progress Notes (Signed)
Dr. Ninfa Linden called back (via OR nurse) and will put orders in for pt to stay overnight.

## 2018-05-22 ENCOUNTER — Encounter (HOSPITAL_COMMUNITY): Payer: Self-pay | Admitting: Surgery

## 2018-05-22 DIAGNOSIS — Z7982 Long term (current) use of aspirin: Secondary | ICD-10-CM | POA: Diagnosis not present

## 2018-05-22 DIAGNOSIS — Z79899 Other long term (current) drug therapy: Secondary | ICD-10-CM | POA: Diagnosis not present

## 2018-05-22 DIAGNOSIS — I2510 Atherosclerotic heart disease of native coronary artery without angina pectoris: Secondary | ICD-10-CM | POA: Diagnosis not present

## 2018-05-22 DIAGNOSIS — K8010 Calculus of gallbladder with chronic cholecystitis without obstruction: Secondary | ICD-10-CM | POA: Diagnosis not present

## 2018-05-22 DIAGNOSIS — I739 Peripheral vascular disease, unspecified: Secondary | ICD-10-CM | POA: Diagnosis not present

## 2018-05-22 DIAGNOSIS — I10 Essential (primary) hypertension: Secondary | ICD-10-CM | POA: Diagnosis not present

## 2018-05-22 NOTE — Progress Notes (Signed)
Pt was discharged home today. Instructions were reviewed with patient, and questions were answered. Pt was taken to main entrance via wheelchair by NT.  

## 2018-05-22 NOTE — Discharge Summary (Signed)
Physician Discharge Summary  Patient ID: Marcus Fuller MRN: [id number] DOB/AGE: [birth year] 76 y.o.  Admit date: 05/21/2018 Discharge date: 05/22/2018  Admission Diagnoses:  Discharge Diagnoses:  Active Problems:   S/P laparoscopic cholecystectomy   Discharged Condition: stable  Hospital Course: uneventful post op recovery.  Discharged home POD#1  Consults: None  Significant Diagnostic Studies:   Treatments: surgery: laparoscopic appendectomy  Discharge Exam: Blood pressure (!) 153/91, pulse 81, temperature 98.6 F (37 C), temperature source Oral, resp. rate 13, height 5\' 10"  (1.778 m), weight 92.7 kg (204 lb 5.9 oz), SpO2 93 %. General appearance: alert, cooperative and no distress Resp: clear to auscultation bilaterally Cardio: regular rate and rhythm, S1, S2 normal, no murmur, click, rub or gallop Incision/Wound:  Disposition: Discharge disposition: 01-Home or Self Care       Discharge Instructions    Diet - low sodium heart healthy   Complete by:  As directed    Increase activity slowly   Complete by:  As directed      Allergies as of 05/22/2018      Reactions   Codeine Itching      Medication List    TAKE these medications   acetaminophen 500 MG tablet Commonly known as:  TYLENOL Take 1,000 mg by mouth every 6 (six) hours as needed for moderate pain.   atorvastatin 20 MG tablet Commonly known as:  LIPITOR Take 1 tablet (20 mg total) by mouth daily.   carbidopa-levodopa 10-100 MG tablet Commonly known as:  SINEMET IR Take 2 tablets by mouth at bedtime as needed (sleep).   ibuprofen 200 MG tablet Commonly known as:  ADVIL,MOTRIN Take 400 mg by mouth every 6 (six) hours as needed for moderate pain.   primidone 50 MG tablet Commonly known as:  MYSOLINE Take 1 tablet (50 mg total) by mouth at bedtime.   traMADol 50 MG tablet Commonly known as:  ULTRAM Take 1 tablet (50 mg total) by mouth every 6 (six) hours as needed.   Vitamin D  (Ergocalciferol) 50000 units Caps capsule Commonly known as:  DRISDOL Take 1 capsule (50,000 Units total) by mouth every 7 (seven) days.      Follow-up Information    Coralie Keens, MD. Schedule an appointment as soon as possible for a visit in 3 weeks.   Specialty:  General Surgery Contact information: [place] [address] [place] [address] [phone]           Signed: Harl Bowie 05/22/2018, 6:22 AM

## 2018-06-06 ENCOUNTER — Ambulatory Visit: Payer: Medicare Other | Admitting: Family Medicine

## 2018-06-07 ENCOUNTER — Ambulatory Visit (INDEPENDENT_AMBULATORY_CARE_PROVIDER_SITE_OTHER): Payer: Medicare Other | Admitting: *Deleted

## 2018-06-07 ENCOUNTER — Telehealth: Payer: Self-pay | Admitting: Cardiology

## 2018-06-07 DIAGNOSIS — I5032 Chronic diastolic (congestive) heart failure: Secondary | ICD-10-CM | POA: Diagnosis not present

## 2018-06-07 NOTE — Telephone Encounter (Signed)
Spoke with pt and reminded pt of remote transmission that is due today. Pt verbalized understanding.   

## 2018-06-08 ENCOUNTER — Encounter: Payer: Self-pay | Admitting: Cardiology

## 2018-06-08 NOTE — Progress Notes (Signed)
Remote pacemaker transmission.   

## 2018-06-13 ENCOUNTER — Telehealth: Payer: Self-pay | Admitting: *Deleted

## 2018-06-13 NOTE — Telephone Encounter (Signed)
I called pt d/t him being on wait list for sooner appt. Offered tomorrow at 11am, check in 1045am. Pt agreeable to date/time. He will bring insurance cards and med list.   Cx appt in August since he is coming in sooner.

## 2018-06-14 ENCOUNTER — Encounter: Payer: Self-pay | Admitting: Neurology

## 2018-06-14 ENCOUNTER — Ambulatory Visit (INDEPENDENT_AMBULATORY_CARE_PROVIDER_SITE_OTHER): Payer: Medicare Other | Admitting: Neurology

## 2018-06-14 ENCOUNTER — Telehealth: Payer: Self-pay | Admitting: Neurology

## 2018-06-14 VITALS — BP 120/87 | HR 102 | Ht 70.0 in | Wt 203.0 lb

## 2018-06-14 DIAGNOSIS — R29818 Other symptoms and signs involving the nervous system: Secondary | ICD-10-CM

## 2018-06-14 DIAGNOSIS — M545 Low back pain, unspecified: Secondary | ICD-10-CM

## 2018-06-14 DIAGNOSIS — I25810 Atherosclerosis of coronary artery bypass graft(s) without angina pectoris: Secondary | ICD-10-CM

## 2018-06-14 DIAGNOSIS — G20 Parkinson's disease: Secondary | ICD-10-CM | POA: Diagnosis not present

## 2018-06-14 DIAGNOSIS — G8929 Other chronic pain: Secondary | ICD-10-CM

## 2018-06-14 NOTE — Patient Instructions (Signed)
We will stop the cogentin and the mysoline for the tremor.  We will start mirapex for the parkinson's disease.

## 2018-06-14 NOTE — Progress Notes (Signed)
Reason for visit: Parkinson's disease  Marcus Fuller is an 77 y.o. male  History of present illness:  Marcus Fuller is a 77 year old right-handed white male with a history of a resting tremor affecting the right greater than left upper extremity.  The patient been on Cogentin and Mysoline, he is having cognitive side effects from these medications, and the tremors have not improved.  The patient has gone off of the Sinemet, he recently had a gallbladder resection on 21 May 2018.  He has recovered from this.  He reports that he is having some chronic issues with low back pain, if he stands too long the pain will ensue without radiation down into either leg.  The pain is in the midpoint of the back.  The patient has been set up for CT scan of the brain, and for some reason this was never done.  The patient returns to this office for an evaluation.  The patient does have some shortness of breath at times.  He has not had any falls.  Past Medical History:  Diagnosis Date  . Anxiety   . Cataract    beginning stages  . Coronary artery disease    stent- 2007   . History of kidney stones   . Hypertension   . Parkinson's disease ([place]) 01/29/2018  . Presence of permanent cardiac pacemaker    2017   . Tremor of right hand     Past Surgical History:  Procedure Laterality Date  . APPENDECTOMY    . bbb    . CHOLECYSTECTOMY N/A 05/21/2018   Procedure: LAPAROSCOPIC CHOLECYSTECTOMY;  Surgeon: Coralie Keens, MD;  Location: [facility];  Service: General;  Laterality: N/A;  . left knee arthroscopy   1996  . PACEMAKER INSERTION  11/2016  . STENT PLACEMENT VASCULAR ([place] [facility])  2007  . TONSILLECTOMY      Family History  Problem Relation Age of Onset  . Heart disease Mother   . Diabetes Mother   . Cancer Mother        breast  . Heart disease Father   . Heart attack Father   . Atrial fibrillation Sister   . Heart disease Brother   . Pancreatic disease Brother   . Tremor Brother   . Heart  attack Paternal Grandfather   . Spina bifida Brother     Social history:  reports that he has never smoked. He has never used smokeless tobacco. He reports that he drinks alcohol. He reports that he does not use drugs.    Allergies  Allergen Reactions  . Codeine Itching    Medications:  Prior to Admission medications   Medication Sig Start Date End Date Taking? Authorizing Provider  acetaminophen (TYLENOL) 500 MG tablet Take 1,000 mg by mouth every 6 (six) hours as needed for moderate pain.   Yes [provider]  atorvastatin (LIPITOR) 20 MG tablet Take 1 tablet (20 mg total) by mouth daily. 05/09/18 08/07/18 Yes Croitoru, Mihai, MD  benztropine (COGENTIN) 0.5 MG tablet Take 0.5 mg by mouth 2 (two) times daily. 06/03/18  Yes [provider]  ibuprofen (ADVIL,MOTRIN) 200 MG tablet Take 400 mg by mouth every 6 (six) hours as needed for moderate pain.   Yes [provider]  primidone (MYSOLINE) 50 MG tablet Take 1 tablet (50 mg total) by mouth at bedtime. 12/05/17  Yes Chipper Herb, MD  traMADol (ULTRAM) 50 MG tablet Take 1 tablet (50 mg total) by mouth every 6 (six)  hours as needed. 04/28/18  Yes Petrucelli, Samantha R, PA-C  Vitamin D, Ergocalciferol, (DRISDOL) 50000 units CAPS capsule Take 1 capsule (50,000 Units total) by mouth every 7 (seven) days. 12/06/17  Yes Chipper Herb, MD    ROS:  Out of a complete 14 system review of symptoms, the patient complains only of the following symptoms, and all other reviewed systems are negative.  Decreased activity, decreased appetite, chills, fatigue Hearing loss, runny nose Light sensitivity Shortness of breath Cold intolerance Constipation Daytime sleepiness, sleep talking Urinary urgency Back pain, walking difficulty Dizziness, headache, numbness, weakness, tremors Depression, anxiety  Blood pressure 120/87, pulse (!) 102, height 5\' 10"  (1.778 m), weight 203 lb (92.1 kg).  Physical Exam  General: The  patient is alert and cooperative at the time of the examination.  Skin: No significant peripheral edema is noted.   Neurologic Exam  Mental status: The patient is alert and oriented x 3 at the time of the examination. The patient has apparent normal recent and remote memory, with an apparently normal attention span and concentration ability.   Cranial nerves: Facial symmetry is present. Speech is normal, no aphasia or dysarthria is noted. Extraocular movements are full. Visual fields are full.  Mild masking of the face is seen.  Motor: The patient has good strength in all 4 extremities.  Sensory examination: Soft touch sensation is symmetric on the face, arms, and legs.  Coordination: The patient has good finger-nose-finger and heel-to-shin bilaterally.  A prominent resting tremor seen with the right upper extremity, mild tremor on the left  Gait and station: The patient is able to rise from a seated position with arms crossed.  Once up, the patient walk independently, decreased arm swing seen on the right, resting tremor while walking is seen on the right.  The patient has slightly unsteady tandem gait.  Romberg is negative.  Reflexes: Deep tendon reflexes are symmetric.   Assessment/Plan:  1.  Parkinson's disease   2.  Low back pain  The patient is not getting any benefit from the Mysoline or the Cogentin, we will stop the medication as he is having cognitive side effects with this.  The patient will be set back up for the CT of the brain, he will have an x-ray of the low back.  I have recommended starting low-dose Mirapex, he wishes to hold off on this for now.  He will follow-up in 6 months.  Jill Alexanders MD 06/14/2018 12:07 PM  [facility] [address] [place], [place] [address]  Phone [phone] Fax [phone]

## 2018-06-14 NOTE — Telephone Encounter (Signed)
medicare/aarp order sent to GI. They will reach out to the pt to schedule and no auth.

## 2018-06-20 ENCOUNTER — Encounter: Payer: Self-pay | Admitting: Family Medicine

## 2018-06-20 ENCOUNTER — Ambulatory Visit (INDEPENDENT_AMBULATORY_CARE_PROVIDER_SITE_OTHER): Payer: Medicare Other | Admitting: Family Medicine

## 2018-06-20 VITALS — BP 136/85 | HR 70 | Temp 97.4°F | Ht 70.0 in | Wt 203.0 lb

## 2018-06-20 DIAGNOSIS — I5022 Chronic systolic (congestive) heart failure: Secondary | ICD-10-CM | POA: Insufficient documentation

## 2018-06-20 DIAGNOSIS — Z0000 Encounter for general adult medical examination without abnormal findings: Secondary | ICD-10-CM

## 2018-06-20 DIAGNOSIS — I10 Essential (primary) hypertension: Secondary | ICD-10-CM | POA: Diagnosis not present

## 2018-06-20 DIAGNOSIS — E559 Vitamin D deficiency, unspecified: Secondary | ICD-10-CM | POA: Diagnosis not present

## 2018-06-20 DIAGNOSIS — I2510 Atherosclerotic heart disease of native coronary artery without angina pectoris: Secondary | ICD-10-CM | POA: Diagnosis not present

## 2018-06-20 DIAGNOSIS — G20C Parkinsonism, unspecified: Secondary | ICD-10-CM

## 2018-06-20 DIAGNOSIS — Z125 Encounter for screening for malignant neoplasm of prostate: Secondary | ICD-10-CM | POA: Diagnosis not present

## 2018-06-20 DIAGNOSIS — G20 Parkinson's disease: Secondary | ICD-10-CM | POA: Diagnosis not present

## 2018-06-20 DIAGNOSIS — I5032 Chronic diastolic (congestive) heart failure: Secondary | ICD-10-CM | POA: Insufficient documentation

## 2018-06-20 LAB — URINALYSIS, COMPLETE
Bilirubin, UA: NEGATIVE
Glucose, UA: NEGATIVE
KETONES UA: NEGATIVE
LEUKOCYTES UA: NEGATIVE
NITRITE UA: NEGATIVE
PH UA: 7 (ref 5.0–7.5)
Protein, UA: NEGATIVE
Specific Gravity, UA: 1.02 (ref 1.005–1.030)
Urobilinogen, Ur: 0.2 mg/dL (ref 0.2–1.0)

## 2018-06-20 LAB — MICROSCOPIC EXAMINATION
EPITHELIAL CELLS (NON RENAL): NONE SEEN /HPF (ref 0–10)
RENAL EPITHEL UA: NONE SEEN /HPF
WBC, UA: NONE SEEN /hpf (ref 0–5)

## 2018-06-20 NOTE — Progress Notes (Signed)
Subjective:    Patient ID: Marcus Fuller, male    DOB: December 04, 1941, 77 y.o.   MRN: [id number]  HPI Pt here for follow up and management of chronic medical problems which includes hypertension. He is taking medication regularly.  The patient is doing well overall.  He does have Parkinson's disease combined systolic and diastolic heart failure and has recently had a laparoscopic cholecystectomy.  The patient is pleased with where he is and has seen the neurologist the cardiologist and had his gallbladder removed.  He had an EKG in May.  He will be due to get a rectal exam today and lab work today.  He refused to do a colonoscopy.  Patient comes to the visit today with his wife.  He reiterated information regarding his visits to the cardiologist and the neurologist.  He has a CT scan of his head planned and also a scan is planned for his back.  He does complain of some back pain and stiffness today and is worse on the right lower back area.  He has had injections in his back in the past but we will wait and see what the x-rays turn from the back before we make any further recommendations.  The neurologist ordered these.  The patient today denies any chest pain pressure or palpitations and recently did see the cardiologist and got a good report from him.  He denies any shortness of breath anymore than usual.  He denies any GI symptoms including nausea vomiting diarrhea blood in the stool or black tarry bowel movements.  He does have constipation and admits to not drinking enough water.  He is passing his water without problems.  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     Patient Active Problem List   Diagnosis Date Noted  . Chronic diastolic heart failure ([place]) 06/20/2018  . Chronic systolic heart failure ([place]) 06/20/2018  . Status post laparoscopic  cholecystectomy 05/21/2018  . Atherosclerosis of aorta ([place]) 05/11/2018  . Coronary artery disease involving coronary bypass graft of native heart without angina pectoris 05/11/2018  . Coronary artery disease involving native coronary artery of native heart without angina pectoris 02/11/2018  . Biventricular cardiac pacemaker in situ 02/11/2018  . Mixed hyperlipidemia 02/11/2018  . Tremor of right hand 02/11/2018  . Parkinson's disease ([place]) 01/29/2018  . Cataract    Outpatient Encounter Medications as of 06/20/2018  Medication Sig  . acetaminophen (TYLENOL) 500 MG tablet Take 1,000 mg by mouth every 6 (six) hours as needed for moderate pain.  Marland Kitchen atorvastatin (LIPITOR) 20 MG tablet Take 1 tablet (20 mg total) by mouth daily.  Marland Kitchen ibuprofen (ADVIL,MOTRIN) 200 MG tablet Take 400 mg by mouth every 6 (six) hours as needed for moderate pain.  . traMADol (ULTRAM) 50 MG tablet Take 1 tablet (50 mg total) by mouth every 6 (six) hours as needed.  . Vitamin D, Ergocalciferol, (DRISDOL) 50000 units CAPS capsule Take 1 capsule (50,000 Units total) by mouth every 7 (seven) days.  . [DISCONTINUED] benztropine (COGENTIN) 0.5 MG tablet Take 0.5 mg by mouth 2 (two) times daily.  . [DISCONTINUED] primidone (MYSOLINE) 50 MG tablet Take 1 tablet (50 mg total) by mouth at bedtime.   No facility-administered encounter medications on file as of 06/20/2018.  Review of Systems  Constitutional: Negative.   HENT: Negative.   Eyes: Negative.   Respiratory: Negative.   Cardiovascular: Negative.   Gastrointestinal: Negative.   Endocrine: Negative.   Genitourinary: Negative.   Musculoskeletal: Positive for back pain.  Skin: Negative.   Allergic/Immunologic: Negative.   Neurological: Negative.   Hematological: Negative.   Psychiatric/Behavioral: Negative.        Objective:   Physical Exam  Constitutional: He is oriented to person, place, and time. He appears well-developed and well-nourished. No distress.    The patient is pleasant and alert and calm and in good spirits.  He can relate his history well to me along with some help from his wife.  HENT:  Head: Normocephalic and atraumatic.  Mouth/Throat: Oropharynx is clear and moist. No oropharyngeal exudate.  Bilateral ear cerumen and they will use Debrox to help remove this.  There is nasal turbinate congestion bilaterally.  Eyes: Pupils are equal, round, and reactive to light. Conjunctivae and EOM are normal. Right eye exhibits no discharge. Left eye exhibits no discharge. No scleral icterus.  Neck: Normal range of motion. Neck supple. No thyromegaly present.  Cardiovascular: Normal rate, regular rhythm, normal heart sounds and intact distal pulses.  No murmur heard. The heart is regular at 72/min with good pedal pulse  Pulmonary/Chest: Effort normal and breath sounds normal. No respiratory distress. He has no wheezes. He has no rales. He exhibits no tenderness.  Lungs are clear anteriorly and posteriorly with no axillary adenopathy or chest wall masses  Abdominal: Soft. Bowel sounds are normal. He exhibits no mass. There is no tenderness. There is no rebound and no guarding.  No liver or spleen enlargement.  No bruits no masses and no inguinal adenopathy with good inguinal pulses.  Musculoskeletal: Normal range of motion. He exhibits no edema.  Lymphadenopathy:    He has no cervical adenopathy.  Neurological: He is alert and oriented to person, place, and time. He has normal reflexes. No cranial nerve deficit.  Patient has a tremor of the right hand greater than the left hand.  Skin: Skin is warm and dry. No erythema.  The skin in general is very dry and flaky.  Psychiatric: He has a normal mood and affect. His behavior is normal. Judgment and thought content normal.  The patient's mood affect and behavior and judgment appeared normal for him.  Nursing note and vitals reviewed.   BP 136/85 (BP Location: Left Arm)   Pulse 70   Temp (!)  97.4 F (36.3 C) (Oral)   Ht '5\' 10"'  (1.778 m)   Wt 203 lb (92.1 kg)   BMI 29.13 kg/m        Assessment & Plan:  1. Essential hypertension -The blood pressure is good today and patient will continue with current treatment - BMP8+EGFR - CBC with Differential/Platelet - Hepatic function panel  2. ASCVD (arteriosclerotic cardiovascular disease) -Continue to follow-up with cardiology - CBC with Differential/Platelet - Lipid panel  3. Primary Parkinsonism ([place]) -Continue to follow-up with neurology - CBC with Differential/Platelet  4. Health care maintenance - CBC with Differential/Platelet - PSA, total and free - Thyroid Panel With TSH - Urinalysis, Complete  5. Vitamin D deficiency -Continue current treatment pending results of lab work - CBC with Differential/Platelet - VITAMIN D 25 Hydroxy (Vit-D Deficiency, Fractures)  Patient Instructions                       Medicare Annual Wellness Visit  [facility] and the medical providers at [facility] strive to bring you the best medical care.  In doing so we not only want to address your current medical conditions and concerns but also to detect new conditions early and prevent illness, disease and health-related problems.    Medicare offers a yearly Wellness Visit which allows our clinical staff to assess your need for preventative services including immunizations, lifestyle education, counseling to decrease risk of preventable diseases and screening for fall risk and other medical concerns.    This visit is provided free of charge (no copay) for all Medicare recipients. The clinical pharmacists at [facility] have begun to conduct these Wellness Visits which will also include a thorough review of all your medications.    As you primary medical provider recommend that you make an appointment for your Annual Wellness Visit if you have not done so already this year.  You may set up  this appointment before you leave today or you may call back ([phone]) and schedule an appointment.  Please make sure when you call that you mention that you are scheduling your Annual Wellness Visit with the clinical pharmacist so that the appointment may be made for the proper length of time.    Continue current medications. Continue good therapeutic lifestyle changes which include good diet and exercise. Fall precautions discussed with patient. If an FOBT was given today- please return it to our front desk. If you are over 33 years old - you may need Prevnar 44 or the adult Pneumonia vaccine.  **Flu shots are available--- please call and schedule a FLU-CLINIC appointment**  After your visit with [name] today you will receive a survey in the mail or online from [employer] regarding your care with [name]. Please take a moment to fill this out. Your feedback is very important to [name] as you can help [name] better understand your patient needs as well as improve your experience and satisfaction. WE CARE ABOUT YOU!!!   [facility] will call with lab work results as soon as they become available.  We will make sure that the cardiologist urologist and neurologist get copies of this lab work. Stay active physically Drink plenty of water we will even discuss this with the cardiologist to make sure he agrees with this. This can help your constipation and may even help your back pain. Eat healthy Eat less carbs and less desserts and drink more water  Arrie Senate MD

## 2018-06-20 NOTE — Patient Instructions (Addendum)
Medicare Annual Wellness Visit  [facility] and the medical providers at [facility] strive to bring you the best medical care.  In doing so we not only want to address your current medical conditions and concerns but also to detect new conditions early and prevent illness, disease and health-related problems.    Medicare offers a yearly Wellness Visit which allows our clinical staff to assess your need for preventative services including immunizations, lifestyle education, counseling to decrease risk of preventable diseases and screening for fall risk and other medical concerns.    This visit is provided free of charge (no copay) for all Medicare recipients. The clinical pharmacists at [facility] have begun to conduct these Wellness Visits which will also include a thorough review of all your medications.    As you primary medical provider recommend that you make an appointment for your Annual Wellness Visit if you have not done so already this year.  You may set up this appointment before you leave today or you may call back ([phone]) and schedule an appointment.  Please make sure when you call that you mention that you are scheduling your Annual Wellness Visit with the clinical pharmacist so that the appointment may be made for the proper length of time.    Continue current medications. Continue good therapeutic lifestyle changes which include good diet and exercise. Fall precautions discussed with patient. If an FOBT was given today- please return it to our front desk. If you are over 16 years old - you may need Prevnar 22 or the adult Pneumonia vaccine.  **Flu shots are available--- please call and schedule a FLU-CLINIC appointment**  After your visit with [name] today you will receive a survey in the mail or online from [employer] regarding your care with [name]. Please take a moment to fill this out. Your feedback is very  important to [name] as you can help [name] better understand your patient needs as well as improve your experience and satisfaction. WE CARE ABOUT YOU!!!   Marcus Fuller will call with lab work results as soon as they become available.  We will make sure that the cardiologist urologist and neurologist get copies of this lab work. Stay active physically Drink plenty of water we will even discuss this with the cardiologist to make sure he agrees with this. This can help your constipation and may even help your back pain. Eat healthy Eat less carbs and less desserts and drink more water We will give you information regarding Cologuard and we would ask that you follow through and get this since she refused to do a colonoscopy.

## 2018-06-21 LAB — PSA, TOTAL AND FREE
PROSTATE SPECIFIC AG, SERUM: 2.3 ng/mL (ref 0.0–4.0)
PSA FREE: 0.83 ng/mL
PSA, Free Pct: 36.1 %

## 2018-06-21 LAB — HEPATIC FUNCTION PANEL
ALBUMIN: 4.7 g/dL (ref 3.5–4.8)
ALK PHOS: 104 IU/L (ref 39–117)
ALT: 22 IU/L (ref 0–44)
AST: 23 IU/L (ref 0–40)
BILIRUBIN TOTAL: 0.7 mg/dL (ref 0.0–1.2)
BILIRUBIN, DIRECT: 0.21 mg/dL (ref 0.00–0.40)
TOTAL PROTEIN: 7.3 g/dL (ref 6.0–8.5)

## 2018-06-21 LAB — CBC WITH DIFFERENTIAL/PLATELET
BASOS: 0 %
Basophils Absolute: 0 10*3/uL (ref 0.0–0.2)
EOS (ABSOLUTE): 0.3 10*3/uL (ref 0.0–0.4)
EOS: 3 %
HEMATOCRIT: 47.8 % (ref 37.5–51.0)
HEMOGLOBIN: 15.7 g/dL (ref 13.0–17.7)
IMMATURE GRANS (ABS): 0.1 10*3/uL (ref 0.0–0.1)
IMMATURE GRANULOCYTES: 1 %
LYMPHS: 23 %
Lymphocytes Absolute: 2.2 10*3/uL (ref 0.7–3.1)
MCH: 27.5 pg (ref 26.6–33.0)
MCHC: 32.8 g/dL (ref 31.5–35.7)
MCV: 84 fL (ref 79–97)
MONOCYTES: 6 %
Monocytes Absolute: 0.6 10*3/uL (ref 0.1–0.9)
NEUTROS PCT: 67 %
Neutrophils Absolute: 6.5 10*3/uL (ref 1.4–7.0)
Platelets: 179 10*3/uL (ref 150–450)
RBC: 5.71 x10E6/uL (ref 4.14–5.80)
RDW: 14.5 % (ref 12.3–15.4)
WBC: 9.6 10*3/uL (ref 3.4–10.8)

## 2018-06-21 LAB — BMP8+EGFR
BUN/Creatinine Ratio: 13 (ref 10–24)
BUN: 14 mg/dL (ref 8–27)
CALCIUM: 10.2 mg/dL (ref 8.6–10.2)
CO2: 24 mmol/L (ref 20–29)
CREATININE: 1.09 mg/dL (ref 0.76–1.27)
Chloride: 102 mmol/L (ref 96–106)
GFR, EST AFRICAN AMERICAN: 76 mL/min/{1.73_m2} (ref >59)
GFR, EST NON AFRICAN AMERICAN: 66 mL/min/{1.73_m2} (ref >59)
Glucose: 96 mg/dL (ref 65–99)
POTASSIUM: 4.1 mmol/L (ref 3.5–5.2)
Sodium: 144 mmol/L (ref 134–144)

## 2018-06-21 LAB — LIPID PANEL
CHOL/HDL RATIO: 3.9 ratio (ref 0.0–5.0)
Cholesterol, Total: 132 mg/dL (ref 100–199)
HDL: 34 mg/dL — ABNORMAL LOW (ref >39)
LDL CALC: 64 mg/dL (ref 0–99)
Triglycerides: 170 mg/dL — ABNORMAL HIGH (ref 0–149)
VLDL CHOLESTEROL CAL: 34 mg/dL (ref 5–40)

## 2018-06-21 LAB — THYROID PANEL WITH TSH
FREE THYROXINE INDEX: 2 (ref 1.2–4.9)
T3 Uptake Ratio: 26 % (ref 24–39)
T4, Total: 7.7 ug/dL (ref 4.5–12.0)
TSH: 5.31 u[IU]/mL — AB (ref 0.450–4.500)

## 2018-06-21 LAB — VITAMIN D 25 HYDROXY (VIT D DEFICIENCY, FRACTURES): Vit D, 25-Hydroxy: 59.3 ng/mL (ref 30.0–100.0)

## 2018-06-22 ENCOUNTER — Ambulatory Visit
Admission: RE | Admit: 2018-06-22 | Discharge: 2018-06-22 | Disposition: A | Discharge status: Home / Self Care | Payer: Medicare Other | Source: Ambulatory Visit | Attending: Neurology | Admitting: Neurology

## 2018-06-22 ENCOUNTER — Telehealth: Payer: Self-pay | Admitting: Neurology

## 2018-06-22 ENCOUNTER — Encounter: Payer: Self-pay | Admitting: Family Medicine

## 2018-06-22 DIAGNOSIS — G8929 Other chronic pain: Secondary | ICD-10-CM

## 2018-06-22 DIAGNOSIS — G20A1 Parkinson's disease without dyskinesia, without mention of fluctuations: Secondary | ICD-10-CM

## 2018-06-22 DIAGNOSIS — M545 Other chronic pain: Secondary | ICD-10-CM

## 2018-06-22 DIAGNOSIS — R29818 Other symptoms and signs involving the nervous system: Secondary | ICD-10-CM | POA: Diagnosis not present

## 2018-06-22 DIAGNOSIS — G20 Parkinson's disease: Secondary | ICD-10-CM

## 2018-06-22 DIAGNOSIS — R7989 Other specified abnormal findings of blood chemistry: Secondary | ICD-10-CM | POA: Insufficient documentation

## 2018-06-22 LAB — CUP PACEART REMOTE DEVICE CHECK
Battery Remaining Longevity: 117 mo
Brady Statistic AP VS Percent: 0.05 %
Brady Statistic AS VP Percent: 7.68 %
Brady Statistic AS VS Percent: 12.03 %
Brady Statistic RA Percent Paced: 81.54 %
Brady Statistic RV Percent Paced: 87.93 %
Implantable Lead Implant Date: 20171211
Implantable Lead Implant Date: 20171211
Implantable Lead Location: 753859
Implantable Lead Location: 753860
Implantable Lead Model: 5076
Implantable Lead Model: 5076
Lead Channel Impedance Value: 361 Ohm
Lead Channel Impedance Value: 418 Ohm
Lead Channel Impedance Value: 418 Ohm
Lead Channel Impedance Value: 418 Ohm
Lead Channel Impedance Value: 418 Ohm
Lead Channel Impedance Value: 608 Ohm
Lead Channel Impedance Value: 703 Ohm
Lead Channel Pacing Threshold Amplitude: 0.625 V
Lead Channel Pacing Threshold Amplitude: 1.125 V
Lead Channel Pacing Threshold Pulse Width: 0.4 ms
Lead Channel Pacing Threshold Pulse Width: 0.4 ms
Lead Channel Sensing Intrinsic Amplitude: 14.875 mV
Lead Channel Sensing Intrinsic Amplitude: 2 mV
Lead Channel Sensing Intrinsic Amplitude: 2 mV
Lead Channel Setting Pacing Amplitude: 1.5 V
Lead Channel Setting Pacing Amplitude: 1.75 V
Lead Channel Setting Pacing Amplitude: 2.25 V
Lead Channel Setting Pacing Pulse Width: 0.4 ms
Lead Channel Setting Sensing Sensitivity: 0.9 mV
MDC IDC LEAD IMPLANT DT: 20171211
MDC IDC LEAD LOCATION: 753858
MDC IDC MSMT BATTERY VOLTAGE: 3 V
MDC IDC MSMT LEADCHNL LV IMPEDANCE VALUE: 456 Ohm
MDC IDC MSMT LEADCHNL LV IMPEDANCE VALUE: 627 Ohm
MDC IDC MSMT LEADCHNL LV IMPEDANCE VALUE: 627 Ohm
MDC IDC MSMT LEADCHNL LV IMPEDANCE VALUE: 684 Ohm
MDC IDC MSMT LEADCHNL RA IMPEDANCE VALUE: 342 Ohm
MDC IDC MSMT LEADCHNL RA IMPEDANCE VALUE: 380 Ohm
MDC IDC MSMT LEADCHNL RV IMPEDANCE VALUE: 361 Ohm
MDC IDC MSMT LEADCHNL RV PACING THRESHOLD AMPLITUDE: 1 V
MDC IDC MSMT LEADCHNL RV PACING THRESHOLD PULSEWIDTH: 0.4 ms
MDC IDC MSMT LEADCHNL RV SENSING INTR AMPL: 14.875 mV
MDC IDC PG IMPLANT DT: 20171211
MDC IDC SESS DTM: 20190621164200
MDC IDC SET LEADCHNL LV PACING PULSEWIDTH: 0.4 ms
MDC IDC STAT BRADY AP VP PERCENT: 80.25 %

## 2018-06-22 IMAGING — CR DG LUMBAR SPINE 2-3V
3 series · 3 of 3 positions shown · non-contrast
Comparison: None.

CLINICAL DATA: Chronic low back pain without sciatica.

EXAM:
LUMBAR SPINE - 2-3 VIEW

[w lumbar spine ap]
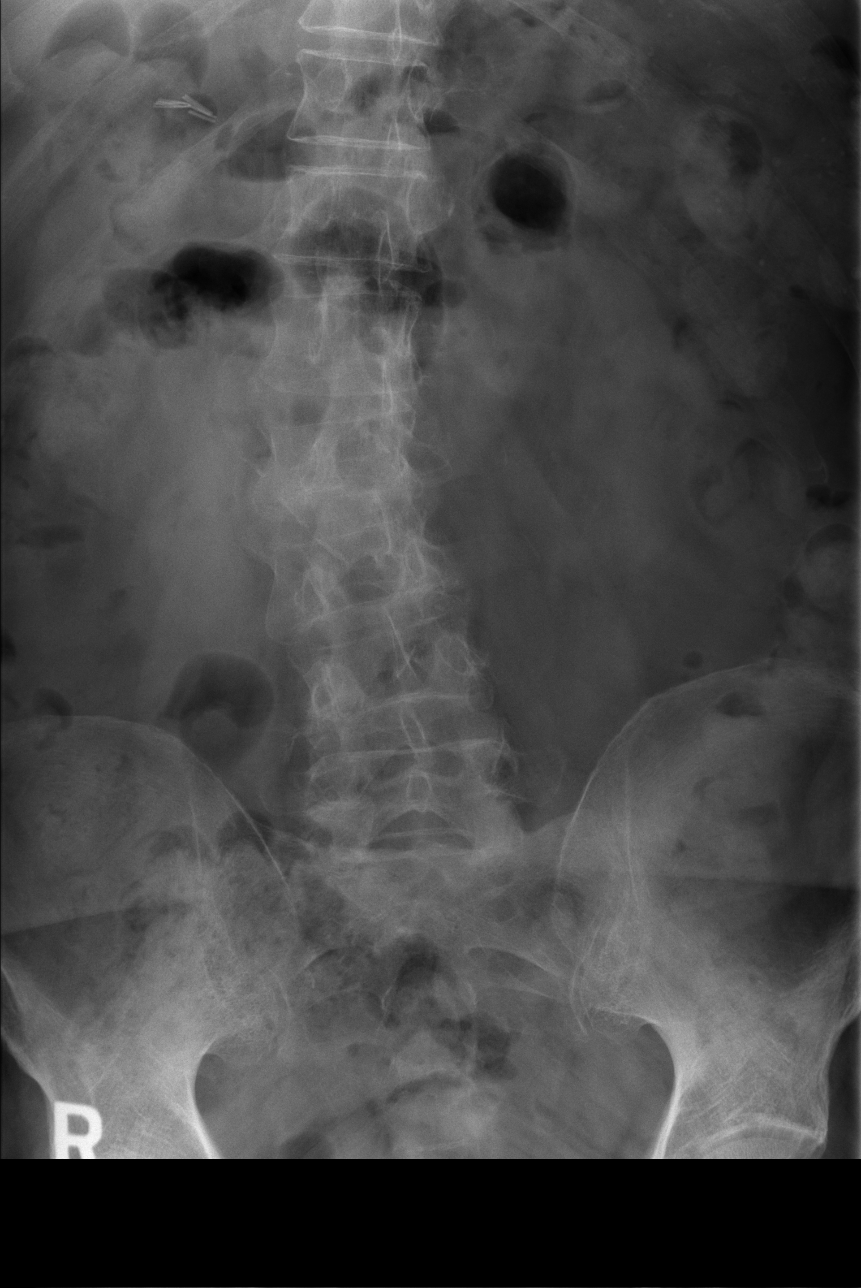

[w lumbar spine lat]
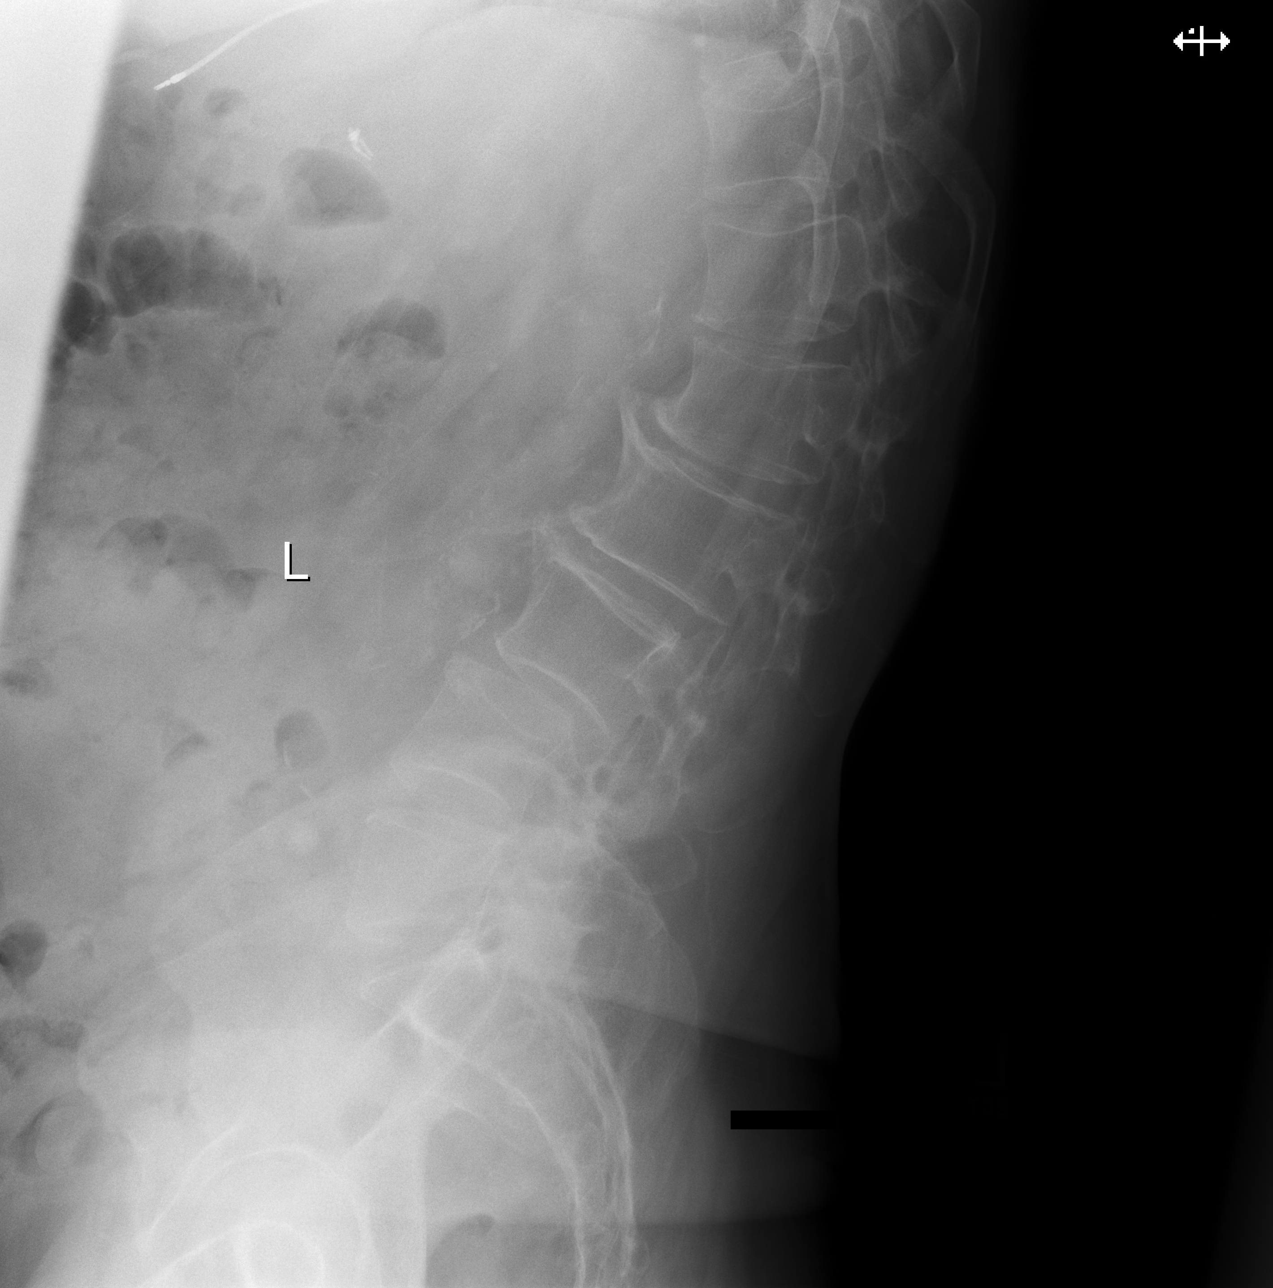

[w lumbar l-5 s-1 spot]
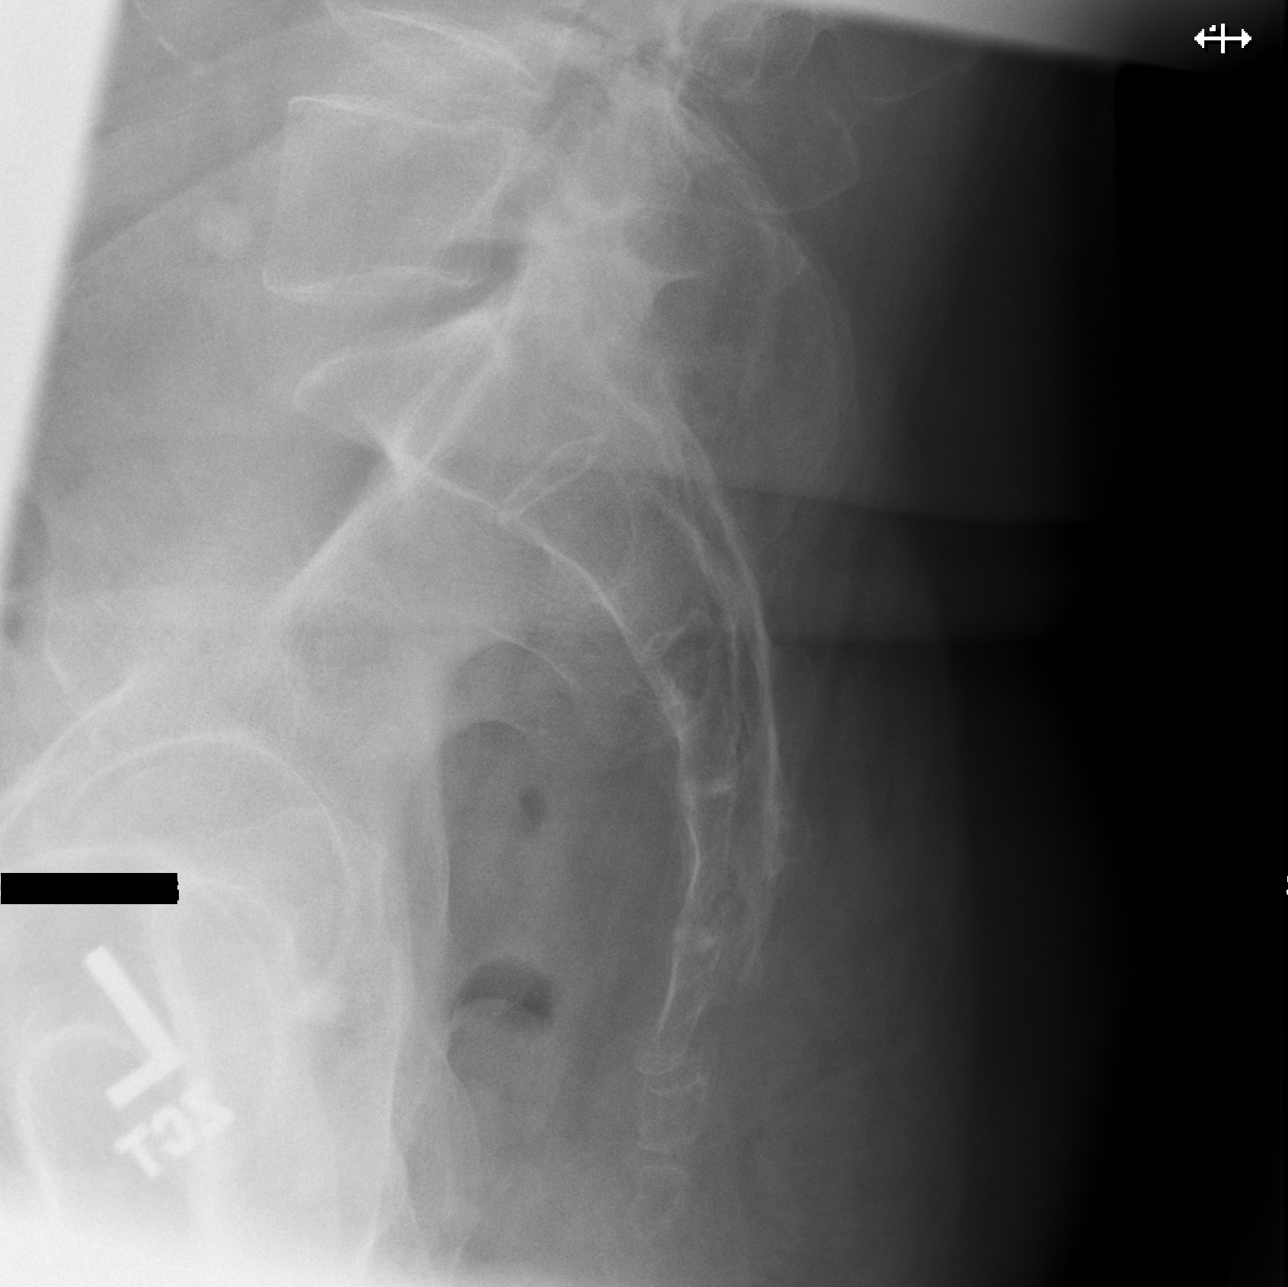

[3 of 3 positions shown; findings below may reference images not displayed]

FINDINGS: Mild dextroscoliosis of lower thoracic and lumbar spine is noted. No
fracture or spondylolisthesis is noted. Mild degenerative disc
disease is noted at L1-2, L2-3 and L3-4. Anterior osteophyte
formation is noted at L1-2 and L2-3.
IMPRESSION: Mild multilevel degenerative disc disease. No acute abnormality seen
in the lumbar spine.

## 2018-06-22 NOTE — Telephone Encounter (Signed)
I called the patient.  X-ray of the lumbar spine does show some mild degenerative changes at multiple levels.  This is the likely source of some of his discomfort.    I discussed this with the patient.  XR lumbar 06/22/18:  IMPRESSION: Mild multilevel degenerative disc disease. No acute abnormality seen in the lumbar spine.

## 2018-06-24 ENCOUNTER — Telehealth: Payer: Self-pay | Admitting: Neurology

## 2018-06-24 MED ORDER — ASPIRIN 81 MG PO TABS: 81.0000 mg | ORAL_TABLET | Freq: Every day | ORAL | Status: DC

## 2018-06-24 NOTE — Telephone Encounter (Signed)
I called the patient.  The CT scan of the head does show mild to moderate small vessel ischemic changes, the patient does have a history of high blood pressure but his blood pressures have been well controlled recently.  He is to go on low-dose aspirin, 81 mg daily.  We will follow his symptoms of parkinsonism over time.   CT brain 06/23/18:  IMPRESSION: This CT scan of the head without contrast shows the following: 1.   Chronic microvascular ischemic changes.  There are no lacunar infarctions or large vessel strokes. 2.   Brain volume is normal for age..    3.   There are no acute findings.

## 2018-06-25 ENCOUNTER — Telehealth: Payer: Self-pay | Admitting: Neurology

## 2018-06-25 NOTE — Telephone Encounter (Signed)
Called pt back. He was seen by Dr. Jannifer Franklin on 06/14/18. Dr. Jannifer Franklin recommended Mirapex but pt wanted to hold off. He was previously on carbidopa-levodopa 10-100mg  qhs prescribed by physician in [facility]. He would like to go back on this. Tremors have worsened. He has some left over he can try and take if okay. Advised Dr. Jannifer Franklin out and I will speak with [facility] and call back to advise.

## 2018-06-25 NOTE — Telephone Encounter (Signed)
Pt requesting a call stating he is wanting to go back on carbidopa-levodopa (SINEMET IR) 10-100 MG tablet and wanting to know if this is ok, please call to advise

## 2018-06-25 NOTE — Telephone Encounter (Signed)
Per. Dr. Krista Blue- ok for pt to re-try carbidopa-levodopa 10-100mg  that he has if he has enough for a trial. If ineffective, we can call in new rx for cabidopa-levodopa 25-100mg  TID.

## 2018-06-25 NOTE — Telephone Encounter (Signed)
I called patient back. Relayed message per Dr. Krista Blue. He has about a month supply of the carbidopa-levodopa 10-100mg . He will call back if he feels this is effective.

## 2018-06-30 ENCOUNTER — Other Ambulatory Visit: Payer: Self-pay | Admitting: Family Medicine

## 2018-07-10 ENCOUNTER — Ambulatory Visit (INDEPENDENT_AMBULATORY_CARE_PROVIDER_SITE_OTHER): Payer: Medicare Other | Admitting: *Deleted

## 2018-07-10 VITALS — BP 125/83 | HR 82 | Ht 70.0 in | Wt 202.4 lb

## 2018-07-10 DIAGNOSIS — Z0000 Encounter for general adult medical examination without abnormal findings: Secondary | ICD-10-CM | POA: Diagnosis not present

## 2018-07-10 NOTE — Patient Instructions (Signed)
  Marcus Fuller , Thank you for taking time to come for your Medicare Wellness Visit. I appreciate your ongoing commitment to your health goals. Please review the following plan we discussed and let me know if I can assist you in the future.   These are the goals we discussed: Goals    . Plan meals     Eat 3 healthy meals daily       This is a list of the screening recommended for you and due dates:  Health Maintenance  Topic Date Due  . Flu Shot  08/07/2018*  . Tetanus Vaccine  12/05/2018*  . Pneumonia vaccines (1 of 2 - PCV13) 12/05/2018*  . Colon Cancer Screening  06/21/2019*  *Topic was postponed. The date shown is not the original due date.

## 2018-07-10 NOTE — Progress Notes (Signed)
Subjective:   Marcus Fuller is a 77 y.o. male who presents for an Initial Medicare Annual Wellness Visit.  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  He lives at home with his wife Marcus Fuller of 60 years and dog Equities trader).  He has 3 children and 4 grand children that visits often.  Marcus Fuller  attempts to walk weekly but has trouble due to back pain and recent gallbladder surgery.  Overall Marcus Fuller feels that his health is worse than it was a year ago due to gallbladder surgery and back problems.    Objective:    Today's Vitals   07/10/18 1059  BP: 125/83  Pulse: 82  Weight: 202 lb 6.4 oz (91.8 kg)  Height: 5\' 10"  (1.778 m)   Body mass index is 29.04 kg/m.  Advanced Directives 05/21/2018 05/21/2018 05/18/2018  Does Patient Have a Medical Advance Directive? - No No  Copy of Healthcare Power of Attorney in Chart? - No - copy requested No - copy requested  Would patient like information on creating a medical advance directive? No - Patient declined - -  Copy requested of Advance Directive  Current Medications (verified) Outpatient Encounter Medications as of 07/10/2018  Medication Sig  . acetaminophen (TYLENOL) 500 MG tablet Take 1,000 mg by mouth every 6 (six) hours as needed for moderate pain.  Marland Kitchen aspirin 81 MG tablet Take 1 tablet (81 mg total) by mouth daily.  Marland Kitchen atorvastatin (LIPITOR) 20 MG tablet Take 1 tablet (20 mg total) by mouth daily.  . carbidopa-levodopa (SINEMET IR) 10-100 MG tablet Take 2 tablets by mouth at bedtime.   Marland Kitchen ibuprofen (ADVIL,MOTRIN) 200 MG tablet Take 400 mg by mouth every 6 (six) hours as needed for moderate pain.  . traMADol (ULTRAM) 50 MG tablet Take 1 tablet (50 mg total) by mouth every 6 (six) hours as needed.  . Vitamin D, Ergocalciferol, (DRISDOL) 50000 units CAPS capsule Take 1 capsule (50,000 Units total) by mouth every 7 (seven) days.   . primidone (MYSOLINE) 50 MG tablet TAKE 1 TABLET BY MOUTH AT BEDTIME (Patient not taking: Reported on 07/10/2018)   No facility-administered encounter medications on file as of 07/10/2018.     Allergies (verified) Codeine   History: Past Medical History:  Diagnosis Date  . Anxiety   . Cataract    beginning stages  . Coronary artery disease    stent- 2007   . History of kidney stones   . Hypertension   . Parkinson's disease ([place]) 01/29/2018  . Presence of permanent cardiac pacemaker    2017   . Tremor of right hand    Past Surgical History:  Procedure Laterality Date  . APPENDECTOMY    . bbb    . CHOLECYSTECTOMY N/A 05/21/2018   Procedure: LAPAROSCOPIC CHOLECYSTECTOMY;  Surgeon: Coralie Keens, MD;  Location: [facility];  Service: General;  Laterality: N/A;  . left knee arthroscopy   1996  . PACEMAKER INSERTION  11/2016  . STENT PLACEMENT VASCULAR ([place] HX)  2007  . TONSILLECTOMY     Family History  Problem Relation Age of Onset  . Heart disease Mother   . Diabetes Mother   . Cancer Mother        breast  . Heart disease Father   . Heart attack Father   . Atrial fibrillation Sister   . Heart disease Brother   .  Pancreatic disease Brother   . Tremor Brother   . Heart attack Paternal Grandfather   . Spina bifida Brother    Social History   Socioeconomic History  . Marital status: Married    Spouse name: Not on file  . Number of children: 2  . Years of education: 20  . Highest education level: Not on file  Occupational History  . Not on file  Social Needs  . Financial resource strain: Not hard at all  . Food insecurity:    Worry: Never true    Inability: Never true  . Transportation needs:    Medical: No    Non-medical: No  Tobacco Use  . Smoking status: Never Smoker  . Smokeless tobacco: Never Used  Substance and Sexual Activity  . Alcohol use: Yes    Comment: social   . Drug use: No  . Sexual activity: Not Currently  Lifestyle  . Physical  activity:    Days per week: 3 days    Minutes per session: 30 min  . Stress: Not at all  Relationships  . Social connections:    Talks on phone: More than three times a week    Gets together: More than three times a week    Attends religious service: More than 4 times per year    Active member of club or organization: Yes    Attends meetings of clubs or organizations: More than 4 times per year    Relationship status: Married  Other Topics Concern  . Not on file  Social History Narrative   Lives w/ wife   Caffeine use: sometimes   Right handed    Tobacco Counseling Never smoker  Clinical Intake:  Pre-visit preparation completed: No  Pain : No/denies pain     Nutritional Risks: None Diabetes: No  How often do you need to have someone help you when you read instructions, pamphlets, or other written materials from your doctor or pharmacy?: 1 - Never What is the last grade level you completed in school?: Bachelors degree   Interpreter Needed?: No  Information entered by :: Truett Mainland, LPN  Activities of Daily Living In your present state of health, do you have any difficulty performing the following activities: 07/10/2018 05/21/2018  Hearing? N -  Vision? N -  Difficulty concentrating or making decisions? N -  Walking or climbing stairs? N -  Dressing or bathing? N -  Doing errands, shopping? N N  Some recent data might be hidden  No trouble with ADLs at this time   Immunizations and Health Maintenance Declines immunizations and colonoscopy at this time Patient Care Team: Chipper Herb, MD as PCP - General (Family Medicine)  Indicate any recent Medical Services you may have received from other than [facility] providers in the past year (date may be approximate).    Assessment:   This is a routine wellness examination for [facility].  Hearing/Vision screen No vision or hearing loss at this time Dietary issues and exercise activities discussed: Current Exercise  Habits: Home exercise routine, Type of exercise: walking, Time (Minutes): 30, Frequency (Times/Week): 3, Weekly Exercise (Minutes/Week): 90  Goals    . Plan meals     Eat 3 healthy meals daily      Depression Screen PHQ 2/9 Scores 07/10/2018 07/10/2018 06/20/2018 01/03/2018  PHQ - 2 Score 0 0 1 0  PHQ- 9 Score 0 - - -    Fall Risk Fall Risk  07/10/2018 06/20/2018 06/14/2018 01/03/2018 12/05/2017  Falls in the past year? Yes Yes No Yes Yes  Number falls in past yr: 1 1 - 2 or more 2 or more  Injury with Fall? No No - No Yes    Is the patient's home free of loose throw rugs in walkways, pet beds, electrical cords, etc?   yes      Grab bars in the bathroom? yes      Handrails on the stairs?   yes      Adequate lighting?   yes  Timed Get Up and Go performed:   Cognitive Function: MMSE - Mini Mental State Exam 07/10/2018  Orientation to time 4  Orientation to Place 5  Registration 3  Attention/ Calculation 5  Recall 3  Language- name 2 objects 2  Language- repeat 1  Language- follow 3 step command 3  Language- read & follow direction 1  Write a sentence 1  Copy design 1  Total score 29        Screening Tests Health Maintenance  Topic Date Due  . INFLUENZA VACCINE  08/07/2018 (Originally 07/19/2018)  . TETANUS/TDAP  12/05/2018 (Originally 11/09/1960)  . PNA vac Low Risk Adult (1 of 2 - PCV13) 12/05/2018 (Originally 11/10/2007)  . COLONOSCOPY  06/21/2019 (Originally 11/09/1992)    Qualifies for Shingles Vaccine? Declined   Cancer Screenings: Lung: Low Dose CT Chest recommended if Age 82-80 years, 30 pack-year currently smoking OR have quit w/in 15years. Patient does not qualify. Colorectal: Declined  Additional Screenings:  Hepatitis C Screening:       Plan:   Encouraged to eat 3 healthy meals daily that consist of lean proteins, fruits and vegetables.  Encouraged to continue to exercise as able. Explained the importance of all immunizations and colon cancer screening.    Encouraged to keep follow up appointment with Dr. Laurance Flatten.   I have personally reviewed and noted the following in the patient's chart:   . Medical and social history . Use of alcohol, tobacco or illicit drugs  . Current medications and supplements . Functional ability and status . Nutritional status . Physical activity . Advanced directives . List of other physicians . Hospitalizations, surgeries, and ER visits in previous 12 months . Vitals . Screenings to include cognitive, depression, and falls . Referrals and appointments  In addition, I have reviewed and discussed with patient certain preventive protocols, quality metrics, and best practice recommendations. A written personalized care plan for preventive services as well as general preventive health recommendations were provided to patient.     Wardell Heath, LPN   [id number]

## 2018-07-24 DIAGNOSIS — M48061 Spinal stenosis, lumbar region without neurogenic claudication: Secondary | ICD-10-CM | POA: Diagnosis not present

## 2018-07-24 DIAGNOSIS — M4186 Other forms of scoliosis, lumbar region: Secondary | ICD-10-CM | POA: Diagnosis not present

## 2018-07-24 DIAGNOSIS — G20 Parkinson's disease: Secondary | ICD-10-CM | POA: Diagnosis not present

## 2018-07-24 DIAGNOSIS — M545 Low back pain: Secondary | ICD-10-CM | POA: Diagnosis not present

## 2018-07-27 ENCOUNTER — Other Ambulatory Visit: Payer: Self-pay | Admitting: Specialist

## 2018-07-27 DIAGNOSIS — M48061 Spinal stenosis, lumbar region without neurogenic claudication: Secondary | ICD-10-CM

## 2018-08-03 ENCOUNTER — Ambulatory Visit
Admission: RE | Admit: 2018-08-03 | Discharge: 2018-08-03 | Disposition: A | Discharge status: Home / Self Care | Payer: Medicare Other | Source: Ambulatory Visit | Attending: Specialist | Admitting: Specialist

## 2018-08-03 DIAGNOSIS — M5127 Other intervertebral disc displacement, lumbosacral region: Secondary | ICD-10-CM | POA: Diagnosis not present

## 2018-08-03 DIAGNOSIS — M48061 Spinal stenosis, lumbar region without neurogenic claudication: Secondary | ICD-10-CM

## 2018-08-03 IMAGING — CT CT L SPINE W/O CM
4 of 5 series · 13 of 33 positions shown, 15 images · non-contrast
Comparison: Lumbar radiographs [DATE].

CLINICAL DATA: 76-year-old male with chronic lumbar spine pain,
progressed since [DATE].

EXAM:
CT LUMBAR SPINE WITHOUT CONTRAST
TECHNIQUE: Multidetector CT imaging of the lumbar spine was performed without
intravenous contrast administration. Multiplanar CT image
reconstructions were also generated.

[Series 3: l-spine 2.00 br40 s3 lspine st · axial · 0.39mm/px · z∈[+1491,+1617]mm · 3 of 127 slices shown]
[im 32/127  bone]
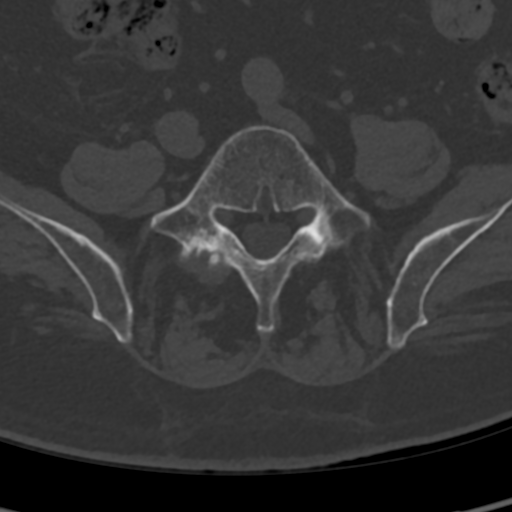
[im 64/127  bone]
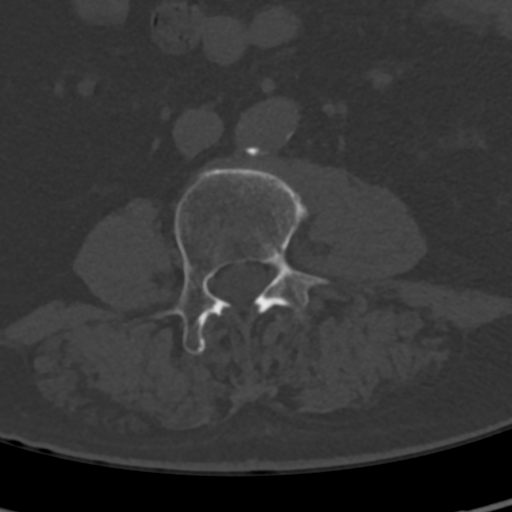
[im 95/127  bone]
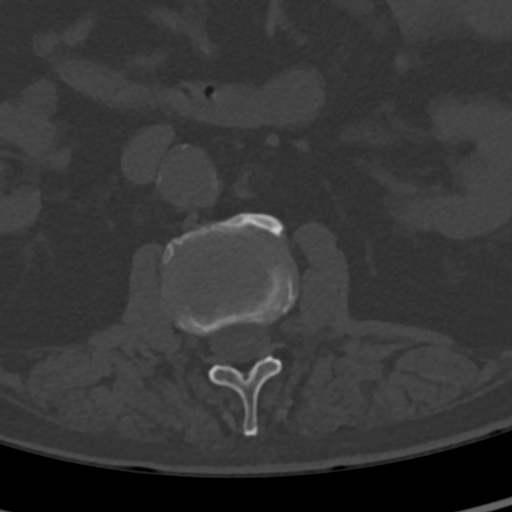

[Series 5: l-spine 2.00 br60 s3 sag sag bone · sagittal · 0.37mm/px · 5 of 93 slices shown, 6 images]
[im 31/93  bone]
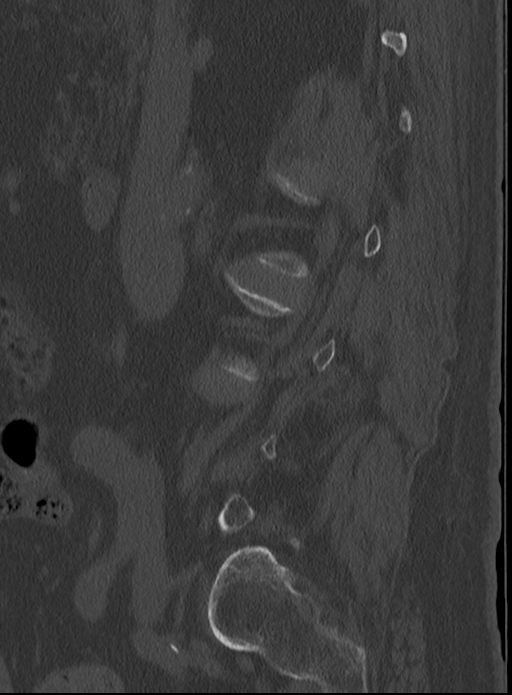
[im 39/93  bone]
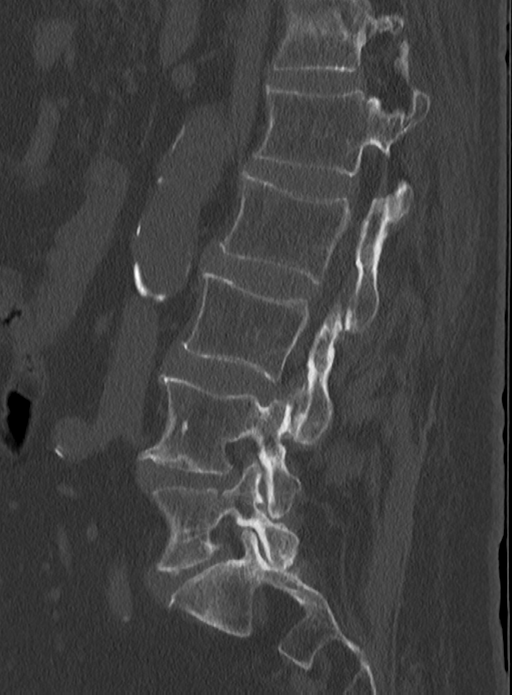
[im 47/93  soft-tissue]
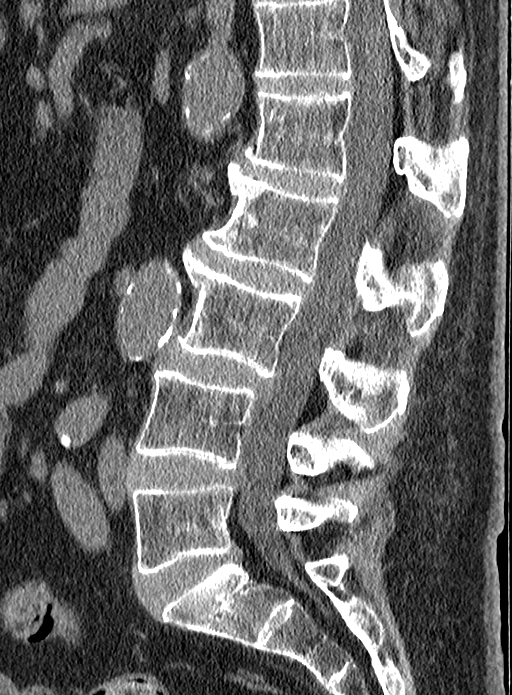
[im 47/93  bone]
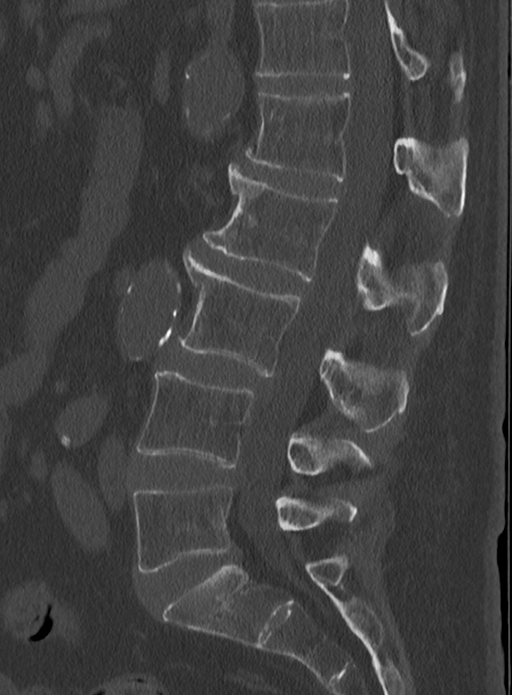
[im 54/93  bone]
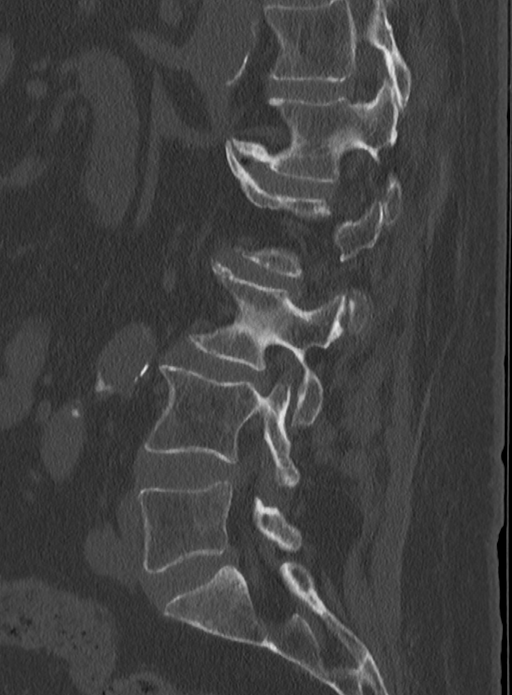
[im 62/93  bone]
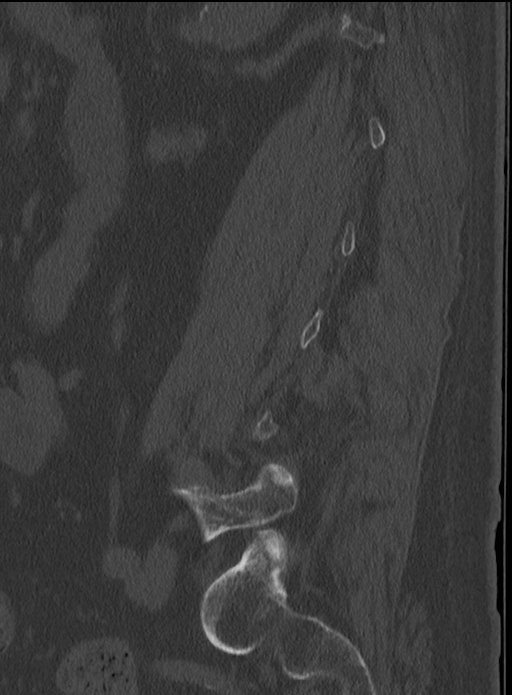

[Series 7: l-spine 2.00 br60 s3 cor cor bone · coronal · 0.50mm/px · 3 of 94 slices shown]
[im 19/94  bone]
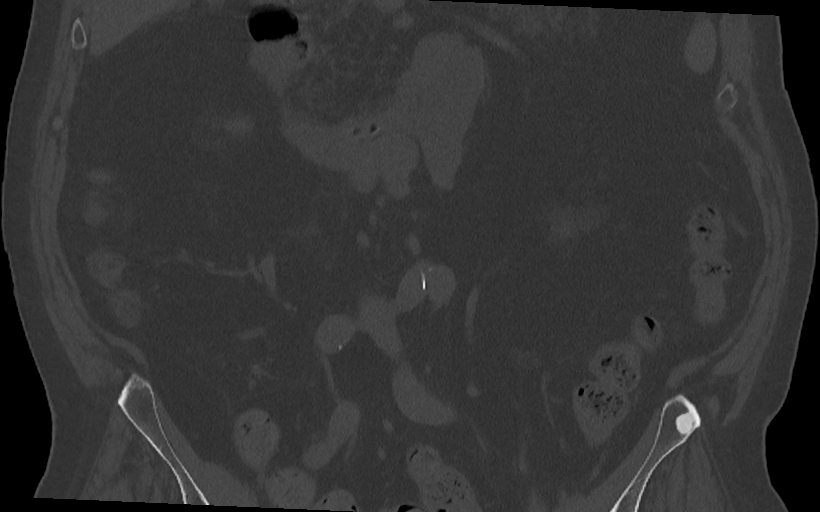
[im 38/94  bone]
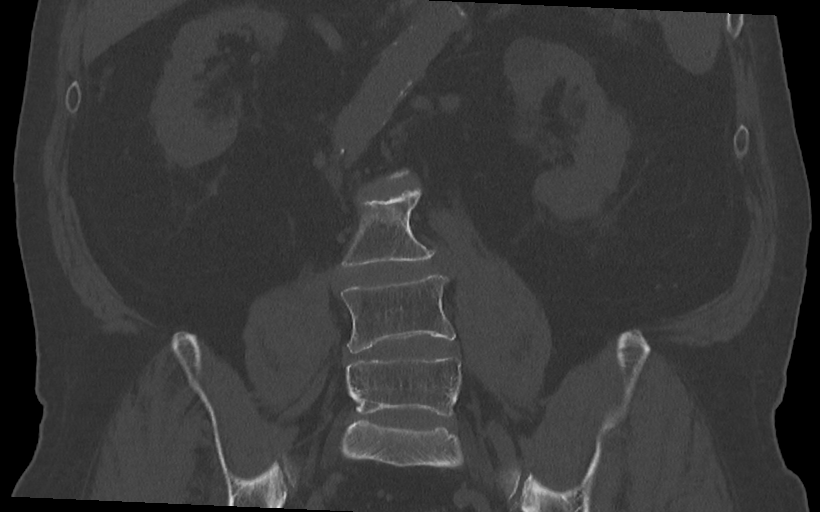
[im 56/94  bone]
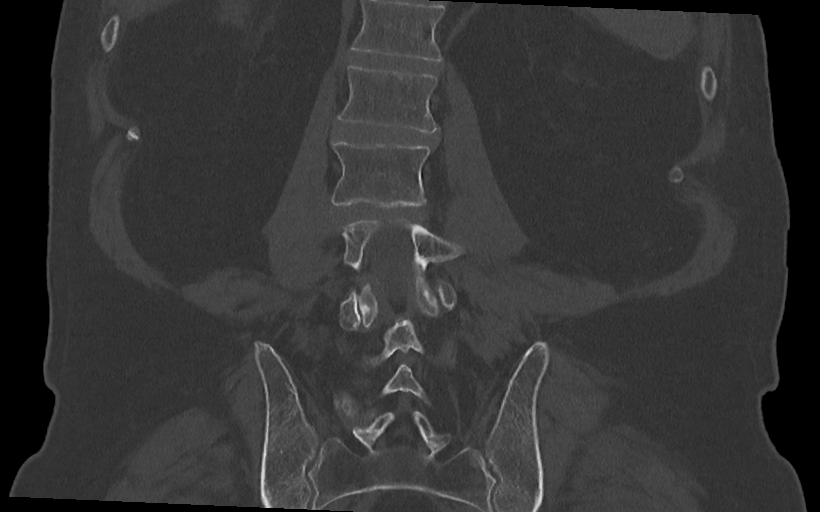

[Series 10: l-spine 2.00 br40 s3 axial true axials · axial · 0.31mm/px · z∈[+1521,+1616]mm · 2 of 120 slices shown, 3 images]
[im 40/120  soft-tissue]
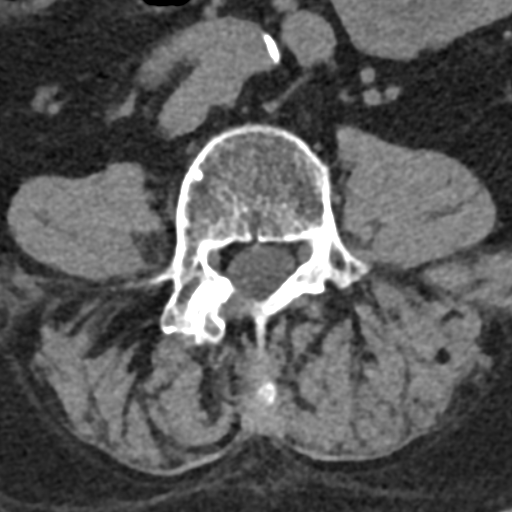
[im 40/120  bone]
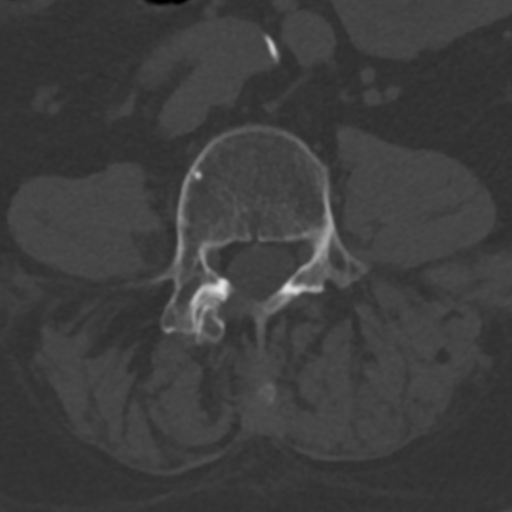
[im 80/120  bone]
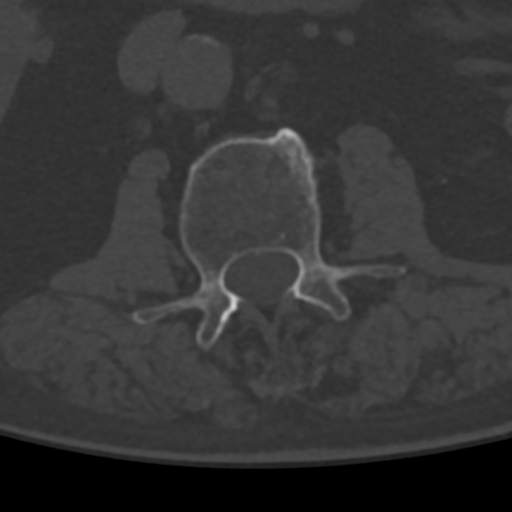

[13 of 33 positions shown; findings below may reference images not displayed]

FINDINGS: Segmentation: Normal, hypoplastic ribs at T12.

Alignment: Mild to moderate dextroconvex lumbar scoliosis, apex at
L2-L3. Relatively preserved lumbar lordosis. Mild retrolisthesis of
L3 on L4.

Vertebrae: Osteopenia. No acute osseous abnormality identified.
Intact visible sacrum and SI joints.

Paraspinal and other soft tissues: Aortoiliac calcified
atherosclerosis. Mildly ectatic abdominal aorta, less than 25
millimeters diameter. Negative other visible noncontrast abdominal
viscera (small duodenal diverticulum. Benign appearing left renal
midpole cyst). Negative visualized posterior paraspinal soft
tissues.

Disc levels:

T12-L1:  Negative.

L1-L2: Anterior endplate spurring with minimal disc bulge. No
stenosis.

L2-L3:  Mild circumferential disc bulge.  No stenosis.

L3-L4: Circumferential disc bulge with moderate broad-based
posterior component and bilateral foraminal involvement.
Superimposed mild to moderate facet and ligament flavum hypertrophy.
Borderline to mild spinal stenosis but suspected mild to moderate
right lateral recess stenosis (right L4 nerve level series 10, image
75). No significant foraminal stenosis.

L4-L5: Right eccentric mild circumferential disc bulge. Mild to
moderate facet and ligament flavum hypertrophy. No significant
spinal stenosis. Mild if any bilateral lateral recess stenosis (L5
nerve levels). Superimposed mild right L4 neural foraminal stenosis,
in part related to the facet disease.

L5-S1: Mild disc bulge and epidural lipomatosis. Moderate facet
hypertrophy on the right. No spinal or lateral recess stenosis.
Borderline to mild right L5 foraminal stenosis.
IMPRESSION: 1. No acute osseous abnormality in the lumbar spine. Mild to
moderate dextroconvex scoliosis.
2. Lumbar disc and endplate degeneration without high-grade spinal
stenosis. However, mild spinal stenosis at L3-L4 appears accompanied
by up to moderate right lateral recess stenosis. Query right L4
radiculitis.
3.  Aortic Atherosclerosis ([15]-[15]).

## 2018-08-08 DIAGNOSIS — M545 Low back pain: Secondary | ICD-10-CM | POA: Diagnosis not present

## 2018-08-09 ENCOUNTER — Ambulatory Visit: Payer: Medicare Other | Admitting: Neurology

## 2018-08-09 ENCOUNTER — Encounter

## 2018-08-10 DIAGNOSIS — G20 Parkinson's disease: Secondary | ICD-10-CM | POA: Diagnosis not present

## 2018-08-10 DIAGNOSIS — M4186 Other forms of scoliosis, lumbar region: Secondary | ICD-10-CM | POA: Diagnosis not present

## 2018-08-10 DIAGNOSIS — M48061 Spinal stenosis, lumbar region without neurogenic claudication: Secondary | ICD-10-CM | POA: Diagnosis not present

## 2018-08-10 DIAGNOSIS — M545 Low back pain: Secondary | ICD-10-CM | POA: Diagnosis not present

## 2018-08-13 DIAGNOSIS — M545 Low back pain: Secondary | ICD-10-CM | POA: Diagnosis not present

## 2018-08-15 DIAGNOSIS — M545 Low back pain: Secondary | ICD-10-CM | POA: Diagnosis not present

## 2018-08-22 DIAGNOSIS — M545 Low back pain: Secondary | ICD-10-CM | POA: Diagnosis not present

## 2018-08-27 DIAGNOSIS — M545 Low back pain: Secondary | ICD-10-CM | POA: Diagnosis not present

## 2018-08-28 DIAGNOSIS — M48061 Spinal stenosis, lumbar region without neurogenic claudication: Secondary | ICD-10-CM | POA: Diagnosis not present

## 2018-08-31 DIAGNOSIS — M545 Low back pain: Secondary | ICD-10-CM | POA: Diagnosis not present

## 2018-09-03 DIAGNOSIS — M545 Low back pain: Secondary | ICD-10-CM | POA: Diagnosis not present

## 2018-09-06 ENCOUNTER — Ambulatory Visit (INDEPENDENT_AMBULATORY_CARE_PROVIDER_SITE_OTHER): Payer: Medicare Other | Admitting: *Deleted

## 2018-09-06 DIAGNOSIS — M545 Low back pain: Secondary | ICD-10-CM | POA: Diagnosis not present

## 2018-09-06 DIAGNOSIS — I5032 Chronic diastolic (congestive) heart failure: Secondary | ICD-10-CM

## 2018-09-06 NOTE — Progress Notes (Signed)
Remote pacemaker transmission.   

## 2018-09-10 DIAGNOSIS — M545 Low back pain: Secondary | ICD-10-CM | POA: Diagnosis not present

## 2018-09-11 DIAGNOSIS — M4186 Other forms of scoliosis, lumbar region: Secondary | ICD-10-CM | POA: Diagnosis not present

## 2018-09-11 DIAGNOSIS — M545 Low back pain: Secondary | ICD-10-CM | POA: Diagnosis not present

## 2018-09-11 DIAGNOSIS — G20 Parkinson's disease: Secondary | ICD-10-CM | POA: Diagnosis not present

## 2018-09-11 DIAGNOSIS — M5136 Other intervertebral disc degeneration, lumbar region: Secondary | ICD-10-CM | POA: Diagnosis not present

## 2018-09-14 DIAGNOSIS — M545 Low back pain: Secondary | ICD-10-CM | POA: Diagnosis not present

## 2018-09-17 DIAGNOSIS — M5136 Other intervertebral disc degeneration, lumbar region: Secondary | ICD-10-CM | POA: Diagnosis not present

## 2018-09-17 DIAGNOSIS — G894 Chronic pain syndrome: Secondary | ICD-10-CM | POA: Diagnosis not present

## 2018-09-19 LAB — CUP PACEART REMOTE DEVICE CHECK
Battery Voltage: 2.99 V
Brady Statistic AP VP Percent: 95.16 %
Brady Statistic AS VP Percent: 2.32 %
Date Time Interrogation Session: 20190919043116
Implantable Lead Implant Date: 20171211
Implantable Lead Location: 753859
Implantable Lead Location: 753860
Implantable Lead Model: 4298
Implantable Pulse Generator Implant Date: 20171211
Lead Channel Impedance Value: 380 Ohm
Lead Channel Impedance Value: 399 Ohm
Lead Channel Impedance Value: 475 Ohm
Lead Channel Impedance Value: 494 Ohm
Lead Channel Impedance Value: 570 Ohm
Lead Channel Impedance Value: 589 Ohm
Lead Channel Impedance Value: 779 Ohm
Lead Channel Impedance Value: 817 Ohm
Lead Channel Pacing Threshold Amplitude: 0.625 V
Lead Channel Pacing Threshold Amplitude: 1.25 V
Lead Channel Pacing Threshold Pulse Width: 0.4 ms
Lead Channel Sensing Intrinsic Amplitude: 2.125 mV
Lead Channel Sensing Intrinsic Amplitude: 2.125 mV
Lead Channel Setting Pacing Amplitude: 1.5 V
Lead Channel Setting Pacing Amplitude: 2.25 V
Lead Channel Setting Pacing Pulse Width: 0.4 ms
Lead Channel Setting Sensing Sensitivity: 0.9 mV
MDC IDC LEAD IMPLANT DT: 20171211
MDC IDC LEAD IMPLANT DT: 20171211
MDC IDC LEAD LOCATION: 753858
MDC IDC MSMT BATTERY REMAINING LONGEVITY: 112 mo
MDC IDC MSMT LEADCHNL LV IMPEDANCE VALUE: 741 Ohm
MDC IDC MSMT LEADCHNL LV IMPEDANCE VALUE: 798 Ohm
MDC IDC MSMT LEADCHNL LV IMPEDANCE VALUE: 855 Ohm
MDC IDC MSMT LEADCHNL RA IMPEDANCE VALUE: 342 Ohm
MDC IDC MSMT LEADCHNL RA IMPEDANCE VALUE: 380 Ohm
MDC IDC MSMT LEADCHNL RA PACING THRESHOLD PULSEWIDTH: 0.4 ms
MDC IDC MSMT LEADCHNL RV IMPEDANCE VALUE: 323 Ohm
MDC IDC MSMT LEADCHNL RV PACING THRESHOLD AMPLITUDE: 1.125 V
MDC IDC MSMT LEADCHNL RV PACING THRESHOLD PULSEWIDTH: 0.4 ms
MDC IDC MSMT LEADCHNL RV SENSING INTR AMPL: 14.375 mV
MDC IDC MSMT LEADCHNL RV SENSING INTR AMPL: 14.375 mV
MDC IDC SET LEADCHNL LV PACING AMPLITUDE: 1.75 V
MDC IDC SET LEADCHNL LV PACING PULSEWIDTH: 0.4 ms
MDC IDC STAT BRADY AP VS PERCENT: 0.04 %
MDC IDC STAT BRADY AS VS PERCENT: 2.48 %
MDC IDC STAT BRADY RA PERCENT PACED: 95.55 %
MDC IDC STAT BRADY RV PERCENT PACED: 97.47 %

## 2018-09-20 DIAGNOSIS — M545 Low back pain: Secondary | ICD-10-CM | POA: Diagnosis not present

## 2018-09-27 ENCOUNTER — Telehealth: Payer: Self-pay | Admitting: Family Medicine

## 2018-09-27 DIAGNOSIS — R197 Diarrhea, unspecified: Secondary | ICD-10-CM | POA: Diagnosis not present

## 2018-09-27 NOTE — Telephone Encounter (Signed)
Left voicemail for patient to return call.

## 2018-10-06 DIAGNOSIS — M47816 Spondylosis without myelopathy or radiculopathy, lumbar region: Secondary | ICD-10-CM | POA: Diagnosis not present

## 2018-10-19 DIAGNOSIS — M545 Low back pain: Secondary | ICD-10-CM | POA: Diagnosis not present

## 2018-10-31 DIAGNOSIS — M9904 Segmental and somatic dysfunction of sacral region: Secondary | ICD-10-CM | POA: Diagnosis not present

## 2018-10-31 DIAGNOSIS — M9905 Segmental and somatic dysfunction of pelvic region: Secondary | ICD-10-CM | POA: Diagnosis not present

## 2018-10-31 DIAGNOSIS — M9903 Segmental and somatic dysfunction of lumbar region: Secondary | ICD-10-CM | POA: Diagnosis not present

## 2018-10-31 DIAGNOSIS — M5136 Other intervertebral disc degeneration, lumbar region: Secondary | ICD-10-CM | POA: Diagnosis not present

## 2018-11-01 DIAGNOSIS — M9904 Segmental and somatic dysfunction of sacral region: Secondary | ICD-10-CM | POA: Diagnosis not present

## 2018-11-01 DIAGNOSIS — M9905 Segmental and somatic dysfunction of pelvic region: Secondary | ICD-10-CM | POA: Diagnosis not present

## 2018-11-01 DIAGNOSIS — M5136 Other intervertebral disc degeneration, lumbar region: Secondary | ICD-10-CM | POA: Diagnosis not present

## 2018-11-01 DIAGNOSIS — M9903 Segmental and somatic dysfunction of lumbar region: Secondary | ICD-10-CM | POA: Diagnosis not present

## 2018-11-05 DIAGNOSIS — M5136 Other intervertebral disc degeneration, lumbar region: Secondary | ICD-10-CM | POA: Diagnosis not present

## 2018-11-05 DIAGNOSIS — M9903 Segmental and somatic dysfunction of lumbar region: Secondary | ICD-10-CM | POA: Diagnosis not present

## 2018-11-05 DIAGNOSIS — M9905 Segmental and somatic dysfunction of pelvic region: Secondary | ICD-10-CM | POA: Diagnosis not present

## 2018-11-05 DIAGNOSIS — M9904 Segmental and somatic dysfunction of sacral region: Secondary | ICD-10-CM | POA: Diagnosis not present

## 2018-11-07 DIAGNOSIS — M5136 Other intervertebral disc degeneration, lumbar region: Secondary | ICD-10-CM | POA: Diagnosis not present

## 2018-11-07 DIAGNOSIS — M9904 Segmental and somatic dysfunction of sacral region: Secondary | ICD-10-CM | POA: Diagnosis not present

## 2018-11-07 DIAGNOSIS — M9905 Segmental and somatic dysfunction of pelvic region: Secondary | ICD-10-CM | POA: Diagnosis not present

## 2018-11-07 DIAGNOSIS — M9903 Segmental and somatic dysfunction of lumbar region: Secondary | ICD-10-CM | POA: Diagnosis not present

## 2018-11-13 DIAGNOSIS — M9903 Segmental and somatic dysfunction of lumbar region: Secondary | ICD-10-CM | POA: Diagnosis not present

## 2018-11-13 DIAGNOSIS — M5136 Other intervertebral disc degeneration, lumbar region: Secondary | ICD-10-CM | POA: Diagnosis not present

## 2018-11-13 DIAGNOSIS — M9904 Segmental and somatic dysfunction of sacral region: Secondary | ICD-10-CM | POA: Diagnosis not present

## 2018-11-13 DIAGNOSIS — M9905 Segmental and somatic dysfunction of pelvic region: Secondary | ICD-10-CM | POA: Diagnosis not present

## 2018-11-19 DIAGNOSIS — M9905 Segmental and somatic dysfunction of pelvic region: Secondary | ICD-10-CM | POA: Diagnosis not present

## 2018-11-19 DIAGNOSIS — M5136 Other intervertebral disc degeneration, lumbar region: Secondary | ICD-10-CM | POA: Diagnosis not present

## 2018-11-19 DIAGNOSIS — M9904 Segmental and somatic dysfunction of sacral region: Secondary | ICD-10-CM | POA: Diagnosis not present

## 2018-11-19 DIAGNOSIS — M9903 Segmental and somatic dysfunction of lumbar region: Secondary | ICD-10-CM | POA: Diagnosis not present

## 2018-11-21 DIAGNOSIS — M5136 Other intervertebral disc degeneration, lumbar region: Secondary | ICD-10-CM | POA: Diagnosis not present

## 2018-11-21 DIAGNOSIS — M9905 Segmental and somatic dysfunction of pelvic region: Secondary | ICD-10-CM | POA: Diagnosis not present

## 2018-11-21 DIAGNOSIS — M9903 Segmental and somatic dysfunction of lumbar region: Secondary | ICD-10-CM | POA: Diagnosis not present

## 2018-11-21 DIAGNOSIS — M9904 Segmental and somatic dysfunction of sacral region: Secondary | ICD-10-CM | POA: Diagnosis not present

## 2018-11-26 DIAGNOSIS — J069 Acute upper respiratory infection, unspecified: Secondary | ICD-10-CM | POA: Diagnosis not present

## 2018-12-03 DIAGNOSIS — M545 Low back pain: Secondary | ICD-10-CM | POA: Diagnosis not present

## 2018-12-03 DIAGNOSIS — M5136 Other intervertebral disc degeneration, lumbar region: Secondary | ICD-10-CM | POA: Diagnosis not present

## 2018-12-06 ENCOUNTER — Ambulatory Visit (INDEPENDENT_AMBULATORY_CARE_PROVIDER_SITE_OTHER): Payer: Medicare Other

## 2018-12-06 DIAGNOSIS — I5022 Chronic systolic (congestive) heart failure: Secondary | ICD-10-CM

## 2018-12-06 NOTE — Progress Notes (Signed)
Remote pacemaker transmission.   

## 2018-12-24 ENCOUNTER — Other Ambulatory Visit: Payer: Self-pay | Admitting: Family Medicine

## 2018-12-26 ENCOUNTER — Ambulatory Visit: Payer: Medicare Other | Admitting: Family Medicine

## 2018-12-27 ENCOUNTER — Ambulatory Visit: Payer: Medicare Other | Admitting: Neurology

## 2018-12-27 ENCOUNTER — Encounter: Payer: Self-pay | Admitting: Neurology

## 2018-12-27 VITALS — BP 122/80 | HR 82 | Ht 70.0 in | Wt 205.0 lb

## 2018-12-27 DIAGNOSIS — G20 Parkinson's disease: Secondary | ICD-10-CM | POA: Diagnosis not present

## 2018-12-27 MED ORDER — GABAPENTIN 100 MG PO CAPS: 100.0000 mg | ORAL_CAPSULE | Freq: Three times a day (TID) | ORAL | 3 refills | Status: DC

## 2018-12-27 NOTE — Patient Instructions (Signed)
Stop the mysoline (primidone).  We will start gabapentin for the back pain.   Neurontin (gabapentin) may result in drowsiness, ankle swelling, gait instability, or possibly dizziness. Please contact our office if significant side effects occur with this medication.

## 2018-12-27 NOTE — Progress Notes (Signed)
Reason for visit: Parkinson's disease, low back pain  Marcus Fuller is an 78 y.o. male  History of present illness:  Marcus Fuller is a 78 year old right-handed white male with a history of Parkinson's disease associated with a resting tremor involving the right upper extremity.  The patient has minimal issues with mobility or gait instability.  He is mainly concerned about the degree of tremor that has not really responded to medical therapy.  He has been on Mysoline in the past, he is back on this starting in July 2019.  He is not clear that this has really helped his tremor.  He could not tolerate Cogentin secondary to cognitive side effects.  The patient has ongoing low back pain, he has been seen by Dr. Tonita Cong and Dr. Nelva Bush previously, epidural injections, back braces, and muscle relaxants have not been helpful.  He denies any pain down the legs, the back pain remains in the center of the back.  CT of the lumbar spine shows some degenerative changes, with no surgically amenable issues.  He returns for an evaluation.  The patient has no back pain with sitting or lying down, only with standing, he can only stand for about 15 minutes before he has to sit down.  Past Medical History:  Diagnosis Date  . Anxiety   . Cataract    beginning stages  . Coronary artery disease    stent- 2007   . History of kidney stones   . Hypertension   . Parkinson's disease ([place]) 01/29/2018  . Presence of permanent cardiac pacemaker    2017   . Tremor of right hand     Past Surgical History:  Procedure Laterality Date  . APPENDECTOMY    . bbb    . CHOLECYSTECTOMY N/A 05/21/2018   Procedure: LAPAROSCOPIC CHOLECYSTECTOMY;  Surgeon: Coralie Keens, MD;  Location: [facility];  Service: General;  Laterality: N/A;  . left knee arthroscopy   1996  . PACEMAKER INSERTION  11/2016  . STENT PLACEMENT VASCULAR ([place] [facility])  2007  . TONSILLECTOMY      Family History  Problem Relation Age of Onset  . Heart disease  Mother   . Diabetes Mother   . Cancer Mother        breast  . Heart disease Father   . Heart attack Father   . Atrial fibrillation Sister   . Heart disease Brother   . Pancreatic disease Brother   . Tremor Brother   . Heart attack Paternal Grandfather   . Spina bifida Brother     Social history:  reports that he has never smoked. He has never used smokeless tobacco. He reports current alcohol use. He reports that he does not use drugs.    Allergies  Allergen Reactions  . Codeine Itching    Medications:  Prior to Admission medications   Medication Sig Start Date End Date Taking? Authorizing Provider  acetaminophen (TYLENOL) 500 MG tablet Take 1,000 mg by mouth every 6 (six) hours as needed for moderate pain.   Yes [provider]  aspirin 81 MG tablet Take 1 tablet (81 mg total) by mouth daily. 06/24/18  Yes Kathrynn Ducking, MD  carbidopa-levodopa (SINEMET IR) 10-100 MG tablet TAKE 2 TABLETS BY MOUTH TWICE A DAY 12/25/18  Yes Chipper Herb, MD  ibuprofen (ADVIL,MOTRIN) 200 MG tablet Take 400 mg by mouth every 6 (six) hours as needed for moderate pain.   Yes [provider]  primidone (MYSOLINE) 50  MG tablet TAKE 1 TABLET BY MOUTH AT BEDTIME 07/02/18  Yes Chipper Herb, MD  traMADol (ULTRAM) 50 MG tablet Take 1 tablet (50 mg total) by mouth every 6 (six) hours as needed. 04/28/18  Yes Petrucelli, Samantha R, PA-C  Vitamin D, Ergocalciferol, (DRISDOL) 50000 units CAPS capsule Take 1 capsule (50,000 Units total) by mouth every 7 (seven) days. 12/06/17  Yes Chipper Herb, MD  atorvastatin (LIPITOR) 20 MG tablet Take 1 tablet (20 mg total) by mouth daily. 05/09/18 08/07/18  Croitoru, Mihai, MD    ROS:  Out of a complete 14 system review of symptoms, the patient complains only of the following symptoms, and all other reviewed systems are negative.  Hearing loss, runny nose, drooling Back pain, walking difficulty Dizziness, numbness, weakness, tremors Agitation,  confusion, decreased concentration  Blood pressure 122/80, pulse 82, height 5\' 10"  (1.778 m), weight 205 lb (93 kg), SpO2 94 %.  Physical Exam  General: The patient is alert and cooperative at the time of the examination.  Skin: No significant peripheral edema is noted.   Neurologic Exam  Mental status: The patient is alert and oriented x 3 at the time of the examination. The patient has apparent normal recent and remote memory, with an apparently normal attention span and concentration ability.   Cranial nerves: Facial symmetry is present. Speech is normal, no aphasia or dysarthria is noted. Extraocular movements are full. Visual fields are full.  Mild masking of the face is seen  Motor: The patient has good strength in all 4 extremities.  Sensory examination: Soft touch sensation is symmetric on the face, arms, and legs.  Coordination: The patient has good finger-nose-finger and heel-to-shin bilaterally.  A resting tremor is noted on the right upper extremity.  Gait and station: The patient has a slightly stooped posture, the patient has good mobility otherwise with good stride and good turns, decreased arm swing is seen with the right arm and resting tremor is noted.. Tandem gait is normal. Romberg is negative. No drift is seen.  Reflexes: Deep tendon reflexes are symmetric.   CT brain 06/23/18:  IMPRESSION: This CT scan of the head without contrast shows the following: 1. Chronic microvascular ischemic changes. There are no lacunar infarctions or large vessel strokes. 2. Brain volume is normal for age..  3. There are no acute findings.   * CT scan images were reviewed online. I agree with the written report.    Assessment/Plan:  1.  Parkinson's disease  2.  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  He is unable to play golf because of the low back pain.  The patient was placed on  low-dose gabapentin taking 100 mg 3 times daily, he will call for any dose adjustments.  The patient will stop the Mysoline.  He will remain on the Sinemet taking 1 tablet 3 times daily.  He will follow-up here in about 5 months.  Jill Alexanders MD 12/27/2018 2:25 PM  [facility] [address] [place],  [address]  Phone [phone] Fax [phone]

## 2018-12-28 ENCOUNTER — Encounter: Payer: Self-pay | Admitting: Family Medicine

## 2018-12-28 ENCOUNTER — Ambulatory Visit (INDEPENDENT_AMBULATORY_CARE_PROVIDER_SITE_OTHER): Payer: Medicare Other | Admitting: Family Medicine

## 2018-12-28 VITALS — BP 136/85 | HR 84 | Temp 96.9°F | Ht 70.0 in | Wt 203.0 lb

## 2018-12-28 DIAGNOSIS — E559 Vitamin D deficiency, unspecified: Secondary | ICD-10-CM | POA: Diagnosis not present

## 2018-12-28 DIAGNOSIS — Z950 Presence of cardiac pacemaker: Secondary | ICD-10-CM

## 2018-12-28 DIAGNOSIS — I10 Essential (primary) hypertension: Secondary | ICD-10-CM

## 2018-12-28 DIAGNOSIS — G20 Parkinson's disease: Secondary | ICD-10-CM | POA: Diagnosis not present

## 2018-12-28 DIAGNOSIS — I5022 Chronic systolic (congestive) heart failure: Secondary | ICD-10-CM

## 2018-12-28 DIAGNOSIS — I2510 Atherosclerotic heart disease of native coronary artery without angina pectoris: Secondary | ICD-10-CM

## 2018-12-28 DIAGNOSIS — Z1211 Encounter for screening for malignant neoplasm of colon: Secondary | ICD-10-CM

## 2018-12-28 DIAGNOSIS — M5440 Lumbago with sciatica, unspecified side: Secondary | ICD-10-CM

## 2018-12-28 DIAGNOSIS — R251 Tremor, unspecified: Secondary | ICD-10-CM

## 2018-12-28 DIAGNOSIS — H6123 Impacted cerumen, bilateral: Secondary | ICD-10-CM | POA: Diagnosis not present

## 2018-12-28 DIAGNOSIS — M47816 Spondylosis without myelopathy or radiculopathy, lumbar region: Secondary | ICD-10-CM

## 2018-12-28 DIAGNOSIS — K449 Diaphragmatic hernia without obstruction or gangrene: Secondary | ICD-10-CM

## 2018-12-28 NOTE — Patient Instructions (Addendum)
Medicare Annual Wellness Visit  [facility] and the medical providers at [facility] strive to bring you the best medical care.  In doing so we not only want to address your current medical conditions and concerns but also to detect new conditions early and prevent illness, disease and health-related problems.    Medicare offers a yearly Wellness Visit which allows our clinical staff to assess your need for preventative services including immunizations, lifestyle education, counseling to decrease risk of preventable diseases and screening for fall risk and other medical concerns.    This visit is provided free of charge (no copay) for all Medicare recipients. The clinical pharmacists at [facility] have begun to conduct these Wellness Visits which will also include a thorough review of all your medications.    As you primary medical provider recommend that you make an appointment for your Annual Wellness Visit if you have not done so already this year.  You may set up this appointment before you leave today or you may call back ([phone]) and schedule an appointment.  Please make sure when you call that you mention that you are scheduling your Annual Wellness Visit with the clinical pharmacist so that the appointment may be made for the proper length of time.      Continue current medications. Continue good therapeutic lifestyle changes which include good diet and exercise. Fall precautions discussed with patient. If an FOBT was given today- please return it to our front desk. If you are over 60 years old - you may need Prevnar 70 or the adult Pneumonia vaccine.  **Flu shots are available--- please call and schedule a FLU-CLINIC appointment**  After your visit with [name] today you will receive a survey in the mail or online from [employer] regarding your care with [name]. Please take a moment to fill this out. Your feedback is very  important to [name] as you can help [name] better understand your patient needs as well as improve your experience and satisfaction. WE CARE ABOUT YOU!!!   Drink more water, try to drink a gallon a day. Walk regularly on flat surface with good shoes Make every effort to not put yourself at risk for falling Consider alternative therapy for back pain Try gabapentin as directed by the neurologist Follow-up with cardiology neurology as planned Follow-up with orthopedics as planned Consider doing the Cologuard test for screening for colon cancer

## 2018-12-28 NOTE — Addendum Note (Signed)
Addended by: Zannie Cove on: 12/28/2018 12:53 PM   Modules accepted: Orders

## 2018-12-28 NOTE — Progress Notes (Signed)
Subjective:    Patient ID: Marcus Fuller, male    DOB: September 03, 1941, 78 y.o.   MRN: [id number]  HPI Pt here for follow up and management of chronic medical problems which includes hypertension. He is taking medication regularly.  Patient describes some nosebleeds today.  He also says his hands feel cold all the time and he has tingling in his feet.  He comes to the visit today with his wife.  He just saw Dr. Jannifer Franklin the neurologist at an office visit yesterday and some of the medication was discontinued because the patient did not feel like it was helping.  He is still on Sinemet.  Did have a CT scan of the head without contrast and it showed chronic microvascular aches chemic changes but no history of any strokes.  The vein brain volume was normal for his age.  He still has a diagnosis of Parkinson's disease.  He was placed on gabapentin 100 mg 3 times daily and the Sinemet dosages 1 tablet 3 times a day.  He is going to follow-up with a neurologist in about 5 months.  Patient also has chronic systolic heart failure.  He has spinal stenosis with chronic low back pain.  He had a laparoscopic cholecystectomy in June.  He sees Dr. Dian Situ for his chronic systolic heart failure.  The patient is keeping up with all of his appointments.  His biggest issues now are his chronic back pain and his tremor.  He says he has had the tremor for a good while and is not much worse than it was when it started but is still very aggravating.  He is also concerned about ongoing back pain and Dr. Jannifer Franklin recommended he try gabapentin 100 mg 3 times a day which he has not started yet.  He sees the cardiologist regularly.  He has been seeing [facility] orthopedic for his ongoing back pain and everything that has been tried has not worked and they have even sent him to a Restaurant manager, fast food and that did not work any either.  We talked about other options like acupuncture drinking more water etc.  He is also going to hope and see  what the gabapentin does with helping him.  Today he denies any chest pain pressure tightness or shortness of breath.  He denies any trouble with swallowing heartburn indigestion nausea vomiting diarrhea blood in the stool or black tarry bowel movements.  He is passing his water well.  He does not have overhead fans and keep the house as cool as possible.  The complaints with a cold hands and tingling feet may be helped by the gabapentin.  We will encourage him to use some nasal saline and nasal saline gel in his nostril at nighttime.   Patient Active Problem List   Diagnosis Date Noted  . Increased thyroid stimulating hormone (TSH) level 06/22/2018  . Chronic diastolic heart failure ([place]) 06/20/2018  . Chronic systolic heart failure ([place]) 06/20/2018  . Status post laparoscopic cholecystectomy 05/21/2018  . Atherosclerosis of aorta ([place]) 05/11/2018  . Coronary artery disease involving coronary bypass graft of native heart without angina pectoris 05/11/2018  . Coronary artery disease involving native coronary artery of native heart without angina pectoris 02/11/2018  . Biventricular cardiac pacemaker in situ 02/11/2018  . Mixed hyperlipidemia 02/11/2018  . Tremor of right hand 02/11/2018  . Parkinson's disease ([place]) 01/29/2018  . Cataract    Outpatient Encounter Medications as of 12/28/2018  Medication Sig  . acetaminophen (TYLENOL) 500  MG tablet Take 1,000 mg by mouth every 6 (six) hours as needed for moderate pain.  Marland Kitchen aspirin 81 MG tablet Take 1 tablet (81 mg total) by mouth daily.  . carbidopa-levodopa (SINEMET IR) 10-100 MG tablet TAKE 2 TABLETS BY MOUTH TWICE A DAY  . gabapentin (NEURONTIN) 100 MG capsule Take 1 capsule (100 mg total) by mouth 3 (three) times daily.  Marland Kitchen ibuprofen (ADVIL,MOTRIN) 200 MG tablet Take 400 mg by mouth every 6 (six) hours as needed for moderate pain.  . traMADol (ULTRAM) 50 MG tablet Take 1 tablet (50 mg total) by mouth every 6 (six) hours as needed.  . Vitamin  D, Ergocalciferol, (DRISDOL) 50000 units CAPS capsule Take 1 capsule (50,000 Units total) by mouth every 7 (seven) days.  . [DISCONTINUED] atorvastatin (LIPITOR) 20 MG tablet Take 1 tablet (20 mg total) by mouth daily.   No facility-administered encounter medications on file as of 12/28/2018.       Review of Systems  Constitutional: Negative.   HENT: Positive for nosebleeds (x 2 in last month ).   Eyes: Negative.   Respiratory: Negative.   Cardiovascular: Negative.   Gastrointestinal: Negative.   Endocrine: Negative.   Genitourinary: Negative.   Musculoskeletal: Negative.   Skin: Negative.   Allergic/Immunologic: Negative.   Neurological: Positive for numbness (finger tips / cold hands ).       Feet tingle   Hematological: Negative.   Psychiatric/Behavioral: Negative.        Objective:   Physical Exam Vitals signs and nursing note reviewed.  Constitutional:      Appearance: He is well-developed. He is obese. He is not ill-appearing.     Comments: Patient is pleasant and alert and making jokes during the visit.  He has a hand tremor worse on the right.  He is limited with his activity because of his back pain and hand tremor.  HENT:     Head: Normocephalic and atraumatic.     Right Ear: Ear canal and external ear normal. There is impacted cerumen.     Left Ear: Ear canal and external ear normal. There is impacted cerumen.     Ears:     Comments: Impacted cerumen bilaterally and patient and wife do not want irrigation.    Nose: Nose normal.     Mouth/Throat:     Mouth: Mucous membranes are moist.     Pharynx: Oropharynx is clear. No oropharyngeal exudate.  Eyes:     General: No scleral icterus.       Right eye: No discharge.        Left eye: No discharge.     Extraocular Movements: Extraocular movements intact.     Conjunctiva/sclera: Conjunctivae normal.     Pupils: Pupils are equal, round, and reactive to light.  Neck:     Musculoskeletal: Normal range of motion and  neck supple.     Thyroid: No thyromegaly.     Vascular: Carotid bruit present.     Trachea: No tracheal deviation.     Comments: No bruits thyromegaly or anterior cervical adenopathy Cardiovascular:     Rate and Rhythm: Normal rate. Rhythm irregular.     Pulses: Normal pulses.     Heart sounds: Normal heart sounds. No murmur. No gallop.   Pulmonary:     Effort: Pulmonary effort is normal. No respiratory distress.     Breath sounds: Normal breath sounds. No wheezing or rales.     Comments: Clear anteriorly and posteriorly and no axillary  adenopathy Chest:     Chest wall: No tenderness.  Abdominal:     General: Abdomen is flat. Bowel sounds are normal.     Palpations: Abdomen is soft. There is no mass.     Tenderness: There is no abdominal tenderness. There is no guarding.  Musculoskeletal: Normal range of motion.        General: No tenderness.     Right lower leg: No edema.     Left lower leg: No edema.  Lymphadenopathy:     Cervical: No cervical adenopathy.  Skin:    General: Skin is warm and dry.     Findings: Lesion present. No rash.     Comments: Patient has a lot of actinic keratoses and nevi over a lot of his body.  There is a wart on 1 of his fingers which he has had for years.  Neurological:     General: No focal deficit present.     Mental Status: He is alert and oriented to person, place, and time. Mental status is at baseline.     Cranial Nerves: No cranial nerve deficit.     Deep Tendon Reflexes: Reflexes are normal and symmetric. Reflexes normal.     Comments: Tremor both hands right greater than left fairly good movement with walking.  Patient is alert.  Psychiatric:        Mood and Affect: Mood normal.        Behavior: Behavior normal.        Thought Content: Thought content normal.        Judgment: Judgment normal.     Comments: Mood affect and behavior are positive for this patient.    BP 136/85 (BP Location: Left Arm)   Pulse 84   Temp (!) 96.9 F (36.1  C) (Oral)   Ht '5\' 10"'  (1.778 m)   Wt 203 lb (92.1 kg)   BMI 29.13 kg/m         Assessment & Plan:  1. ASCVD (arteriosclerotic cardiovascular disease) -Continues with pacemaker checks and follow-up with Dr. Sallyanne Kuster for his chronic systolic heart failure. - CBC with Differential/Platelet - Lipid panel - Hepatic function panel  2. Essential hypertension -Pressure is good today and he will continue with current treatment - CBC with Differential/Platelet - BMP8+EGFR - Hepatic function panel  3. Primary Parkinsonism ([place]) -Continue to follow-up with Dr. Jannifer Franklin - CBC with Differential/Platelet  4. Vitamin D deficiency -Continue vitamin D and calcium pending results of lab work - CBC with Differential/Platelet - VITAMIN D 25 Hydroxy (Vit-D Deficiency, Fractures)  5. Excessive cerumen in ear canal, bilateral -Patient has excessive ear cerumen but neither wife nor patient want to have this irrigated.  6. Screen for colon cancer -Cologuard  7. Hiatal hernia -Continue to protect stomach with H2 blocker or proton pump inhibitor and watch diet more closely  8. Tremor -Follow-up with neurology as planned  9. Pacemaker -Follow-up with cardiology as planned  10. Low back pain with sciatica, sciatica laterality unspecified, unspecified back pain laterality, unspecified chronicity -Follow-up with orthopedics as planned  11. Chronic systolic heart failure ([place]) -Follow-up with cardiology as planned and patient appears to be doing well with this currently  12. Lumbar spondylosis -Consider drinking more fluids use warm wet compresses consider acupuncture and if necessary follow back up with orthopedics if none of these work.  Patient Instructions  Medicare Annual Wellness Visit  [facility] and the medical providers at [facility] strive to bring you the best medical care.  In doing so we not only want to address your current medical  conditions and concerns but also to detect new conditions early and prevent illness, disease and health-related problems.    Medicare offers a yearly Wellness Visit which allows our clinical staff to assess your need for preventative services including immunizations, lifestyle education, counseling to decrease risk of preventable diseases and screening for fall risk and other medical concerns.    This visit is provided free of charge (no copay) for all Medicare recipients. The clinical pharmacists at [facility] have begun to conduct these Wellness Visits which will also include a thorough review of all your medications.    As you primary medical provider recommend that you make an appointment for your Annual Wellness Visit if you have not done so already this year.  You may set up this appointment before you leave today or you may call back ([phone]) and schedule an appointment.  Please make sure when you call that you mention that you are scheduling your Annual Wellness Visit with the clinical pharmacist so that the appointment may be made for the proper length of time.      Continue current medications. Continue good therapeutic lifestyle changes which include good diet and exercise. Fall precautions discussed with patient. If an FOBT was given today- please return it to our front desk. If you are over 14 years old - you may need Prevnar 80 or the adult Pneumonia vaccine.  **Flu shots are available--- please call and schedule a FLU-CLINIC appointment**  After your visit with [name] today you will receive a survey in the mail or online from [employer] regarding your care with [name]. Please take a moment to fill this out. Your feedback is very important to [name] as you can help [name] better understand your patient needs as well as improve your experience and satisfaction. WE CARE ABOUT YOU!!!   Drink more water, try to drink a gallon a day. Walk regularly on flat surface with good  shoes Make every effort to not put yourself at risk for falling Consider alternative therapy for back pain Try gabapentin as directed by the neurologist Follow-up with cardiology neurology as planned Follow-up with orthopedics as planned Consider doing the Cologuard test for screening for colon cancer  Arrie Senate MD

## 2018-12-29 LAB — CBC WITH DIFFERENTIAL/PLATELET
Basophils Absolute: 0.1 10*3/uL (ref 0.0–0.2)
Basos: 1 %
EOS (ABSOLUTE): 0.2 10*3/uL (ref 0.0–0.4)
Eos: 3 %
Hematocrit: 46.3 % (ref 37.5–51.0)
Hemoglobin: 15.5 g/dL (ref 13.0–17.7)
Immature Grans (Abs): 0.1 10*3/uL (ref 0.0–0.1)
Immature Granulocytes: 1 %
Lymphocytes Absolute: 2.1 10*3/uL (ref 0.7–3.1)
Lymphs: 22 %
MCH: 28.2 pg (ref 26.6–33.0)
MCHC: 33.5 g/dL (ref 31.5–35.7)
MCV: 84 fL (ref 79–97)
Monocytes Absolute: 0.8 10*3/uL (ref 0.1–0.9)
Monocytes: 8 %
Neutrophils Absolute: 6.3 10*3/uL (ref 1.4–7.0)
Neutrophils: 65 %
Platelets: 165 10*3/uL (ref 150–450)
RBC: 5.49 x10E6/uL (ref 4.14–5.80)
RDW: 13 % (ref 11.6–15.4)
WBC: 9.5 10*3/uL (ref 3.4–10.8)

## 2018-12-29 LAB — LIPID PANEL
Chol/HDL Ratio: 5.6 ratio — ABNORMAL HIGH (ref 0.0–5.0)
Cholesterol, Total: 209 mg/dL — ABNORMAL HIGH (ref 100–199)
HDL: 37 mg/dL — ABNORMAL LOW (ref >39)
LDL Calculated: 125 mg/dL — ABNORMAL HIGH (ref 0–99)
Triglycerides: 233 mg/dL — ABNORMAL HIGH (ref 0–149)
VLDL CHOLESTEROL CAL: 47 mg/dL — AB (ref 5–40)

## 2018-12-29 LAB — BMP8+EGFR
BUN/Creatinine Ratio: 15 (ref 10–24)
BUN: 15 mg/dL (ref 8–27)
CHLORIDE: 101 mmol/L (ref 96–106)
CO2: 22 mmol/L (ref 20–29)
Calcium: 10.5 mg/dL — ABNORMAL HIGH (ref 8.6–10.2)
Creatinine, Ser: 1 mg/dL (ref 0.76–1.27)
GFR calc Af Amer: 84 mL/min/{1.73_m2} (ref >59)
GFR calc non Af Amer: 73 mL/min/{1.73_m2} (ref >59)
Glucose: 85 mg/dL (ref 65–99)
Potassium: 4.2 mmol/L (ref 3.5–5.2)
Sodium: 141 mmol/L (ref 134–144)

## 2018-12-29 LAB — HEPATIC FUNCTION PANEL
ALT: 42 IU/L (ref 0–44)
AST: 39 IU/L (ref 0–40)
Albumin: 4.4 g/dL (ref 3.5–4.8)
Alkaline Phosphatase: 173 IU/L — ABNORMAL HIGH (ref 39–117)
Bilirubin Total: 0.7 mg/dL (ref 0.0–1.2)
Bilirubin, Direct: 0.19 mg/dL (ref 0.00–0.40)
Total Protein: 6.6 g/dL (ref 6.0–8.5)

## 2018-12-29 LAB — VITAMIN D 25 HYDROXY (VIT D DEFICIENCY, FRACTURES): Vit D, 25-Hydroxy: 29.5 ng/mL — ABNORMAL LOW (ref 30.0–100.0)

## 2019-01-06 LAB — CUP PACEART REMOTE DEVICE CHECK
Battery Voltage: 2.99 V
Brady Statistic AP VP Percent: 91.66 %
Brady Statistic AP VS Percent: 0.04 %
Brady Statistic AS VP Percent: 3.84 %
Brady Statistic RA Percent Paced: 92.32 %
Date Time Interrogation Session: 20191219042802
Implantable Lead Implant Date: 20171211
Implantable Lead Implant Date: 20171211
Implantable Lead Implant Date: 20171211
Implantable Lead Location: 753858
Implantable Lead Location: 753859
Implantable Lead Location: 753860
Implantable Lead Model: 4298
Implantable Lead Model: 5076
Implantable Lead Model: 5076
Implantable Pulse Generator Implant Date: 20171211
Lead Channel Impedance Value: 323 Ohm
Lead Channel Impedance Value: 342 Ohm
Lead Channel Impedance Value: 380 Ohm
Lead Channel Impedance Value: 399 Ohm
Lead Channel Impedance Value: 475 Ohm
Lead Channel Impedance Value: 570 Ohm
Lead Channel Impedance Value: 570 Ohm
Lead Channel Impedance Value: 589 Ohm
Lead Channel Impedance Value: 779 Ohm
Lead Channel Impedance Value: 855 Ohm
Lead Channel Impedance Value: 855 Ohm
Lead Channel Impedance Value: 874 Ohm
Lead Channel Impedance Value: 912 Ohm
Lead Channel Pacing Threshold Amplitude: 0.5 V
Lead Channel Pacing Threshold Amplitude: 1 V
Lead Channel Pacing Threshold Amplitude: 1.375 V
Lead Channel Pacing Threshold Pulse Width: 0.4 ms
Lead Channel Pacing Threshold Pulse Width: 0.4 ms
Lead Channel Pacing Threshold Pulse Width: 0.4 ms
Lead Channel Sensing Intrinsic Amplitude: 1.875 mV
Lead Channel Sensing Intrinsic Amplitude: 1.875 mV
Lead Channel Sensing Intrinsic Amplitude: 12.625 mV
Lead Channel Sensing Intrinsic Amplitude: 12.625 mV
Lead Channel Setting Pacing Amplitude: 1.5 V
Lead Channel Setting Pacing Amplitude: 2 V
Lead Channel Setting Pacing Pulse Width: 0.4 ms
Lead Channel Setting Pacing Pulse Width: 0.4 ms
Lead Channel Setting Sensing Sensitivity: 0.9 mV
MDC IDC MSMT BATTERY REMAINING LONGEVITY: 110 mo
MDC IDC MSMT LEADCHNL LV IMPEDANCE VALUE: 418 Ohm
MDC IDC SET LEADCHNL LV PACING AMPLITUDE: 2 V
MDC IDC STAT BRADY AS VS PERCENT: 4.46 %
MDC IDC STAT BRADY RV PERCENT PACED: 95.5 %

## 2019-01-15 ENCOUNTER — Other Ambulatory Visit: Payer: Self-pay | Admitting: Family Medicine

## 2019-01-16 LAB — CUP PACEART INCLINIC DEVICE CHECK
Implantable Lead Implant Date: 20171211
Implantable Lead Implant Date: 20171211
Implantable Lead Location: 753858
Implantable Lead Location: 753859
Implantable Lead Location: 753860
Implantable Lead Model: 4298
Implantable Lead Model: 5076
Implantable Lead Model: 5076
Implantable Pulse Generator Implant Date: 20171211
MDC IDC LEAD IMPLANT DT: 20171211
MDC IDC SESS DTM: 20200129145053

## 2019-01-19 ENCOUNTER — Other Ambulatory Visit: Payer: Self-pay | Admitting: Neurology

## 2019-01-22 DIAGNOSIS — Z0100 Encounter for examination of eyes and vision without abnormal findings: Secondary | ICD-10-CM | POA: Diagnosis not present

## 2019-01-22 DIAGNOSIS — H2513 Age-related nuclear cataract, bilateral: Secondary | ICD-10-CM | POA: Diagnosis not present

## 2019-03-01 ENCOUNTER — Other Ambulatory Visit: Payer: Self-pay | Admitting: Family Medicine

## 2019-03-07 ENCOUNTER — Other Ambulatory Visit: Payer: Self-pay

## 2019-03-07 ENCOUNTER — Ambulatory Visit (INDEPENDENT_AMBULATORY_CARE_PROVIDER_SITE_OTHER): Payer: Medicare Other | Admitting: *Deleted

## 2019-03-07 DIAGNOSIS — I5022 Chronic systolic (congestive) heart failure: Secondary | ICD-10-CM | POA: Diagnosis not present

## 2019-03-07 LAB — CUP PACEART REMOTE DEVICE CHECK
Battery Remaining Longevity: 107 mo
Battery Voltage: 2.99 V
Brady Statistic AP VP Percent: 93.9 %
Brady Statistic AS VP Percent: 1.82 %
Brady Statistic AS VS Percent: 4.24 %
Brady Statistic RV Percent Paced: 95.72 %
Date Time Interrogation Session: 20200319042821
Implantable Lead Implant Date: 20171211
Implantable Lead Implant Date: 20171211
Implantable Lead Location: 753858
Implantable Lead Location: 753859
Implantable Lead Location: 753860
Implantable Lead Model: 4298
Implantable Lead Model: 5076
Implantable Pulse Generator Implant Date: 20171211
Lead Channel Impedance Value: 323 Ohm
Lead Channel Impedance Value: 323 Ohm
Lead Channel Impedance Value: 380 Ohm
Lead Channel Impedance Value: 380 Ohm
Lead Channel Impedance Value: 399 Ohm
Lead Channel Impedance Value: 418 Ohm
Lead Channel Impedance Value: 437 Ohm
Lead Channel Impedance Value: 475 Ohm
Lead Channel Impedance Value: 532 Ohm
Lead Channel Impedance Value: 684 Ohm
Lead Channel Impedance Value: 703 Ohm
Lead Channel Impedance Value: 722 Ohm
Lead Channel Impedance Value: 741 Ohm
Lead Channel Impedance Value: 741 Ohm
Lead Channel Pacing Threshold Amplitude: 0.5 V
Lead Channel Pacing Threshold Amplitude: 1 V
Lead Channel Pacing Threshold Amplitude: 1 V
Lead Channel Pacing Threshold Pulse Width: 0.4 ms
Lead Channel Pacing Threshold Pulse Width: 0.4 ms
Lead Channel Pacing Threshold Pulse Width: 0.4 ms
Lead Channel Sensing Intrinsic Amplitude: 13.125 mV
Lead Channel Sensing Intrinsic Amplitude: 13.125 mV
Lead Channel Sensing Intrinsic Amplitude: 2.25 mV
Lead Channel Sensing Intrinsic Amplitude: 2.25 mV
Lead Channel Setting Pacing Amplitude: 1.5 V
Lead Channel Setting Pacing Amplitude: 1.5 V
Lead Channel Setting Pacing Amplitude: 2 V
Lead Channel Setting Pacing Pulse Width: 0.4 ms
Lead Channel Setting Sensing Sensitivity: 0.9 mV
MDC IDC LEAD IMPLANT DT: 20171211
MDC IDC SET LEADCHNL RV PACING PULSEWIDTH: 0.4 ms
MDC IDC STAT BRADY AP VS PERCENT: 0.04 %
MDC IDC STAT BRADY RA PERCENT PACED: 94.67 %

## 2019-03-14 NOTE — Progress Notes (Signed)
Remote pacemaker transmission.   

## 2019-04-02 ENCOUNTER — Other Ambulatory Visit: Payer: Self-pay | Admitting: Family Medicine

## 2019-05-10 DIAGNOSIS — M40209 Unspecified kyphosis, site unspecified: Secondary | ICD-10-CM | POA: Diagnosis not present

## 2019-05-10 DIAGNOSIS — I10 Essential (primary) hypertension: Secondary | ICD-10-CM | POA: Diagnosis not present

## 2019-05-10 DIAGNOSIS — M4726 Other spondylosis with radiculopathy, lumbar region: Secondary | ICD-10-CM | POA: Diagnosis not present

## 2019-05-10 DIAGNOSIS — M549 Dorsalgia, unspecified: Secondary | ICD-10-CM | POA: Diagnosis not present

## 2019-05-14 ENCOUNTER — Telehealth: Payer: Self-pay | Admitting: Cardiovascular Disease

## 2019-05-14 NOTE — Telephone Encounter (Signed)
Call wife's cellphone-[phone]/ consent/ my chart active/ pre reg completed

## 2019-05-15 ENCOUNTER — Encounter: Payer: Self-pay | Admitting: Cardiovascular Disease

## 2019-05-15 ENCOUNTER — Telehealth (INDEPENDENT_AMBULATORY_CARE_PROVIDER_SITE_OTHER): Payer: Medicare Other | Admitting: Cardiovascular Disease

## 2019-05-15 VITALS — BP 137/84 | HR 60 | Ht 70.0 in | Wt 205.0 lb

## 2019-05-15 DIAGNOSIS — I700 Atherosclerosis of aorta: Secondary | ICD-10-CM

## 2019-05-15 DIAGNOSIS — E782 Mixed hyperlipidemia: Secondary | ICD-10-CM

## 2019-05-15 DIAGNOSIS — Z950 Presence of cardiac pacemaker: Secondary | ICD-10-CM | POA: Diagnosis not present

## 2019-05-15 DIAGNOSIS — I472 Ventricular tachycardia: Secondary | ICD-10-CM

## 2019-05-15 DIAGNOSIS — I5022 Chronic systolic (congestive) heart failure: Secondary | ICD-10-CM

## 2019-05-15 DIAGNOSIS — I2510 Atherosclerotic heart disease of native coronary artery without angina pectoris: Secondary | ICD-10-CM | POA: Diagnosis not present

## 2019-05-15 DIAGNOSIS — I4729 Other ventricular tachycardia: Secondary | ICD-10-CM

## 2019-05-15 NOTE — Progress Notes (Signed)
Virtual Visit via Telephone Note   This visit type was conducted due to national recommendations for restrictions regarding the COVID-19 Pandemic (e.g. social distancing) in an effort to limit this patient's exposure and mitigate transmission in our community.  Due to his co-morbid illnesses, this patient is at least at moderate risk for complications without adequate follow up.  This format is felt to be most appropriate for this patient at this time.  The patient did not have access to video technology/had technical difficulties with video requiring transitioning to audio format only (telephone).  All issues noted in this document were discussed and addressed.  No physical exam could be performed with this format.  Please refer to the patient's chart for his  consent to telehealth for [facility].   Unable to establish video connection due to lack of patient equipment.  Date:  05/15/2019   ID:  Marcus Fuller, DOB [birth year], MRN [id number]  Patient Location: Home Provider Location: Home  PCP:  Chipper Herb, MD  Cardiologist:  Peter Keyworth Electrophysiologist:  None   Evaluation Performed:  Follow-Up Visit  Chief Complaint:  Follow up CAD/CHF/CRT-P  History of Present Illness:    Marcus Fuller is a 78 y.o. male with a remote history of coronary artery disease, here for follow-up of CHF and CRT pacemaker.  Few weeks back he had some anterior chest discomfort that occurred while lying in bed at night, persisted for several hours, but was gone by the time he woke up in the morning.  He has not otherwise had problems with angina pectoris and does not even recall what his symptoms were before his diagnosis of coronary disease in 2017.  His last evaluation was a low risk nuclear stress test in 2010.  The patient specifically denies any chest pain with exertion, dyspnea at rest or with exertion, orthopnea, paroxysmal nocturnal dyspnea, syncope, palpitations, focal neurological  deficits, intermittent claudication, lower extremity edema, unexplained weight gain, cough, hemoptysis or wheezing.  His tremor has improved while taking medicines for Parkinson's disease including Cogentin.  He had no cardiac problems with his cholecystectomy last year.  He has been primarily limited by severe back pain and is soon to receive a brace for scoliosis.  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  Despite this his pacemaker still shows roughly 2 hours a day of activity.  His thoracic impedance is flat without any evidence of recent fluid overload.  He has 95% atrial pacing and 96% ventricular pacing.  He has rare episodes of atrial tachycardia and very occasional episodes of nonsustained VT (3 since his last device check), the longest being only 2 seconds in duration.  All of these appear to be asymptomatic. Lead parameters are excellent and at this point device longevity is estimated at 8.8 years.  He had a dual-chamber biventricular pacemaker implanted in November 2017 (Medtronic Percepta CRT-P).  The device was previously followed by Dr. Arminda Resides, MD, [phone].  The patient does not have symptoms concerning for COVID-19 infection (fever, chills, cough, or new shortness of breath).    Past Medical History:  Diagnosis Date  . Anxiety   . Cataract    beginning stages  . Coronary artery disease    stent- 2007   . History of kidney stones   . Hypertension   . Parkinson's disease ([place]) 01/29/2018  . Presence of permanent cardiac pacemaker    2017   .  Tremor of right hand    Past Surgical History:  Procedure Laterality Date  . APPENDECTOMY    . bbb    . CHOLECYSTECTOMY N/A 05/21/2018   Procedure: LAPAROSCOPIC CHOLECYSTECTOMY;  Surgeon: Coralie Keens, MD;  Location: [facility];  Service: General;  Laterality: N/A;  . left knee arthroscopy   1996  . PACEMAKER INSERTION  11/2016  . STENT PLACEMENT VASCULAR  ([place] HX)  2007  . TONSILLECTOMY       Current Meds  Medication Sig  . acetaminophen (TYLENOL) 500 MG tablet Take 1,000 mg by mouth every 6 (six) hours as needed for moderate pain.  Marland Kitchen aspirin 81 MG tablet Take 1 tablet (81 mg total) by mouth daily.  Marland Kitchen atorvastatin (LIPITOR) 20 MG tablet Take 20 mg by mouth daily.  . carbidopa-levodopa (SINEMET IR) 10-100 MG tablet TAKE 2 TABLETS BY MOUTH TWICE A DAY  . gabapentin (NEURONTIN) 100 MG capsule TAKE 1 CAPSULE BY MOUTH THREE TIMES A DAY  . ibuprofen (ADVIL,MOTRIN) 200 MG tablet Take 400 mg by mouth every 6 (six) hours as needed for moderate pain.  . traMADol (ULTRAM) 50 MG tablet Take 1 tablet (50 mg total) by mouth every 6 (six) hours as needed.  . Vitamin D, Ergocalciferol, (DRISDOL) 1.25 MG (50000 UT) CAPS capsule TAKE 1 CAPSULE BY MOUTH EVERY 7 DAYS     Allergies:   Codeine   Social History   Tobacco Use  . Smoking status: Never Smoker  . Smokeless tobacco: Never Used  Substance Use Topics  . Alcohol use: Yes    Comment: social   . Drug use: No     Family Hx: The patient's family history includes Atrial fibrillation in his sister; Cancer in his mother; Diabetes in his mother; Heart attack in his father and paternal grandfather; Heart disease in his brother, father, and mother; Pancreatic disease in his brother; Spina bifida in his brother; Tremor in his brother.  ROS:   Please see the history of present illness.     All other systems reviewed and are negative.   Prior CV studies:   The following studies were reviewed today:  Most recent pacemaker download from March 07, 2019  Labs/Other Tests and Data Reviewed:    EKG:  An ECG dated 05/09/2018 was personally reviewed today and demonstrated:  Atrial paced, biventricular paced rhythm with prominent R waves in lead V1 V2 consistent with left ventricular capture  Recent Labs: 06/20/2018: TSH 5.310 12/28/2018: ALT 42; BUN 15; Creatinine, Ser 1.00; Hemoglobin 15.5; Platelets  165; Potassium 4.2; Sodium 141   Recent Lipid Panel Lab Results  Component Value Date/Time   CHOL 209 (H) 12/28/2018 12:45 PM   TRIG 233 (H) 12/28/2018 12:45 PM   HDL 37 (L) 12/28/2018 12:45 PM   CHOLHDL 5.6 (H) 12/28/2018 12:45 PM   LDLCALC 125 (H) 12/28/2018 12:45 PM    Wt Readings from Last 3 Encounters:  05/15/19 205 lb (93 kg)  12/28/18 203 lb (92.1 kg)  12/27/18 205 lb (93 kg)     Objective:    Vital Signs:  BP 137/84   Pulse 60   Ht 5\' 10"  (1.778 m)   Wt 205 lb (93 kg)   BMI 29.41 kg/m    VITAL SIGNS:  reviewed Unable to examine  ASSESSMENT & PLAN:    1. CHF: Appears to be very well compensated both by his clinical history and by thoracic impedance review.  Most recent EF was excellent at 50%.  Has good biventricular  pacing percentage. 2. CAD: Had one episode of chest discomfort at night.  It is not clear whether this was related to his scoliosis/back problems, but it sounds distinctly not ischemic in nature.  His ECG would be nondiagnostic. Let's see how he does with his back brace. If symptoms recur we will schedule him for a [employer]. 3. CRT-P: Normal device function.  Continue remote downloads every 3 months and yearly office visit. 4. HLP: His most recent lipid profile was performed when he was briefly off the statin, but he has restarted it.  When he was taking it daily his LDL cholesterol was in the 60s and I suspect it is back at that level now. 5. Aortic atherosclerosis: Without evidence of aneurysm.  On statin.  COVID-19 Education: The signs and symptoms of COVID-19 were discussed with the patient and how to seek care for testing (follow up with PCP or arrange E-visit).  The importance of social distancing was discussed today.  Time:   Today, I have spent 26 minutes with the patient with telehealth technology discussing the above problems.     Medication Adjustments/Labs and Tests Ordered: Current medicines are reviewed at length with the patient  today.  Concerns regarding medicines are outlined above.   Tests Ordered: No orders of the defined types were placed in this encounter.   Medication Changes: No orders of the defined types were placed in this encounter.  Patient Instructions  Medication Instructions:  Continue same medications If you need a refill on your cardiac medications before your next appointment, please call your pharmacy.   Lab work: None ordered   Testing/Procedures: None ordered  Follow-Up: At [facility], you and your health needs are our priority.  As part of our continuing mission to provide you with exceptional heart care, we have created designated Provider Care Teams.  These Care Teams include your primary Cardiologist (physician) and Advanced Practice Providers (APPs -  Physician Assistants and Nurse Practitioners) who all work together to provide you with the care you need, when you need it. . Schedule follow up appointment in 12 months    Call 3 months before to schedule      Disposition:  Follow up 12 months  Signed, Sanda Klein, MD  05/15/2019 9:27 AM    [place]

## 2019-05-15 NOTE — Patient Instructions (Signed)
Medication Instructions:  Continue same medications If you need a refill on your cardiac medications before your next appointment, please call your pharmacy.   Lab work: None ordered   Testing/Procedures: None ordered  Follow-Up: At [facility], you and your health needs are our priority.  As part of our continuing mission to provide you with exceptional heart care, we have created designated Provider Care Teams.  These Care Teams include your primary Cardiologist (physician) and Advanced Practice Providers (APPs -  Physician Assistants and Nurse Practitioners) who all work together to provide you with the care you need, when you need it. . Schedule follow up appointment in 12 months    Call 3 months before to schedule

## 2019-05-17 DIAGNOSIS — G894 Chronic pain syndrome: Secondary | ICD-10-CM | POA: Diagnosis not present

## 2019-06-04 DIAGNOSIS — L570 Actinic keratosis: Secondary | ICD-10-CM | POA: Diagnosis not present

## 2019-06-04 DIAGNOSIS — D485 Neoplasm of uncertain behavior of skin: Secondary | ICD-10-CM | POA: Diagnosis not present

## 2019-06-04 DIAGNOSIS — C44319 Basal cell carcinoma of skin of other parts of face: Secondary | ICD-10-CM | POA: Diagnosis not present

## 2019-06-05 DIAGNOSIS — M40209 Unspecified kyphosis, site unspecified: Secondary | ICD-10-CM | POA: Diagnosis not present

## 2019-06-06 ENCOUNTER — Ambulatory Visit (INDEPENDENT_AMBULATORY_CARE_PROVIDER_SITE_OTHER): Payer: Medicare Other | Admitting: *Deleted

## 2019-06-06 DIAGNOSIS — I5022 Chronic systolic (congestive) heart failure: Secondary | ICD-10-CM

## 2019-06-07 LAB — CUP PACEART REMOTE DEVICE CHECK
Battery Remaining Longevity: 94 mo
Battery Voltage: 2.98 V
Brady Statistic AP VP Percent: 96.86 %
Brady Statistic AP VS Percent: 0.05 %
Brady Statistic AS VP Percent: 1.29 %
Brady Statistic AS VS Percent: 1.8 %
Brady Statistic RA Percent Paced: 97.19 %
Brady Statistic RV Percent Paced: 98.15 %
Date Time Interrogation Session: 20200619060446
Implantable Lead Implant Date: 20171211
Implantable Lead Implant Date: 20171211
Implantable Lead Implant Date: 20171211
Implantable Lead Location: 753858
Implantable Lead Location: 753859
Implantable Lead Location: 753860
Implantable Lead Model: 4298
Implantable Lead Model: 5076
Implantable Lead Model: 5076
Implantable Pulse Generator Implant Date: 20171211
Lead Channel Impedance Value: 323 Ohm
Lead Channel Impedance Value: 323 Ohm
Lead Channel Impedance Value: 361 Ohm
Lead Channel Impedance Value: 380 Ohm
Lead Channel Impedance Value: 418 Ohm
Lead Channel Impedance Value: 475 Ohm
Lead Channel Impedance Value: 570 Ohm
Lead Channel Impedance Value: 570 Ohm
Lead Channel Impedance Value: 665 Ohm
Lead Channel Impedance Value: 760 Ohm
Lead Channel Impedance Value: 855 Ohm
Lead Channel Impedance Value: 855 Ohm
Lead Channel Impedance Value: 874 Ohm
Lead Channel Impedance Value: 893 Ohm
Lead Channel Pacing Threshold Amplitude: 0.5 V
Lead Channel Pacing Threshold Amplitude: 1.125 V
Lead Channel Pacing Threshold Amplitude: 1.125 V
Lead Channel Pacing Threshold Pulse Width: 0.4 ms
Lead Channel Pacing Threshold Pulse Width: 0.4 ms
Lead Channel Pacing Threshold Pulse Width: 0.4 ms
Lead Channel Sensing Intrinsic Amplitude: 1.625 mV
Lead Channel Sensing Intrinsic Amplitude: 1.625 mV
Lead Channel Sensing Intrinsic Amplitude: 15.125 mV
Lead Channel Sensing Intrinsic Amplitude: 15.125 mV
Lead Channel Setting Pacing Amplitude: 1.5 V
Lead Channel Setting Pacing Amplitude: 1.75 V
Lead Channel Setting Pacing Amplitude: 2.5 V
Lead Channel Setting Pacing Pulse Width: 0.4 ms
Lead Channel Setting Pacing Pulse Width: 0.4 ms
Lead Channel Setting Sensing Sensitivity: 0.9 mV

## 2019-06-10 NOTE — Progress Notes (Signed)
Remote pacemaker transmission.   

## 2019-06-13 ENCOUNTER — Other Ambulatory Visit: Payer: Self-pay | Admitting: Cardiovascular Disease

## 2019-06-14 DIAGNOSIS — C44319 Basal cell carcinoma of skin of other parts of face: Secondary | ICD-10-CM | POA: Diagnosis not present

## 2019-07-01 ENCOUNTER — Ambulatory Visit: Payer: Medicare Other | Admitting: Family Medicine

## 2019-07-03 ENCOUNTER — Ambulatory Visit: Payer: PRIVATE HEALTH INSURANCE | Admitting: Neurology

## 2019-07-03 ENCOUNTER — Encounter: Payer: Self-pay | Admitting: Neurology

## 2019-07-03 ENCOUNTER — Other Ambulatory Visit: Payer: Self-pay

## 2019-07-03 VITALS — BP 106/67 | HR 88 | Temp 98.7°F | Ht 70.0 in | Wt 205.0 lb

## 2019-07-03 DIAGNOSIS — G8929 Other chronic pain: Secondary | ICD-10-CM

## 2019-07-03 DIAGNOSIS — G20 Parkinson's disease: Secondary | ICD-10-CM | POA: Diagnosis not present

## 2019-07-03 DIAGNOSIS — M545 Low back pain, unspecified: Secondary | ICD-10-CM

## 2019-07-03 HISTORY — DX: Other chronic pain: G89.29

## 2019-07-03 HISTORY — DX: Low back pain, unspecified: M54.50

## 2019-07-03 MED ORDER — GABAPENTIN 300 MG PO CAPS: 300.0000 mg | ORAL_CAPSULE | Freq: Three times a day (TID) | ORAL | 3 refills | Status: DC

## 2019-07-03 NOTE — Patient Instructions (Signed)
We will go up on the gabapentin dose to 200 mg three times a day, after 1 to 2 weeks, start 300 mg three times a day.  May try benadryl 25 mg tablet, 1/2 three times a day.

## 2019-07-03 NOTE — Progress Notes (Signed)
Reason for visit: Parkinson's disease, chronic low back pain  Marcus Fuller is an 78 y.o. male  History of present illness:  Marcus Fuller is a 78 year old right-handed white male with a history of chronic low back pain which is his primary medical issue.  This problem limits his ability to perform physical activity, if he is standing for more than 15 minutes he has significant pain and has to sit down.  His back pain tends to get much better with sitting or lying down.  He does have Parkinson's disease with a very prominent tremor involving the right upper extremity.  He has been placed on gabapentin for the tremor and for the back pain in very low-dose without much benefit.  He tolerates the medication otherwise fairly well.  He remains on Sinemet taking the 10/100 mg tablets, taking 2 twice daily.  He has relatively good mobility, he is able to get up from a chair with ease, he has not had any significant balance issues or freezing episodes or falls.  He returns to this office for an evaluation.  Past Medical History:  Diagnosis Date  . Anxiety   . Cataract    beginning stages  . Chronic low back pain 07/03/2019  . Coronary artery disease    stent- 2007   . History of kidney stones   . Hypertension   . Parkinson's disease ([place]) 01/29/2018  . Presence of permanent cardiac pacemaker    2017   . Tremor of right hand     Past Surgical History:  Procedure Laterality Date  . APPENDECTOMY    . bbb    . CHOLECYSTECTOMY N/A 05/21/2018   Procedure: LAPAROSCOPIC CHOLECYSTECTOMY;  Surgeon: Coralie Keens, MD;  Location: [facility];  Service: General;  Laterality: N/A;  . left knee arthroscopy   1996  . PACEMAKER INSERTION  11/2016  . STENT PLACEMENT VASCULAR ([place] HX)  2007  . TONSILLECTOMY      Family History  Problem Relation Age of Onset  . Heart disease Mother   . Diabetes Mother   . Cancer Mother        breast  . Heart disease Father   . Heart attack Father   . Atrial  fibrillation Sister   . Heart disease Brother   . Pancreatic disease Brother   . Tremor Brother   . Heart attack Paternal Grandfather   . Spina bifida Brother     Social history:  reports that he has never smoked. He has never used smokeless tobacco. He reports current alcohol use. He reports that he does not use drugs.    Allergies  Allergen Reactions  . Codeine Itching    Medications:  Prior to Admission medications   Medication Sig Start Date End Date Taking? Authorizing Provider  acetaminophen (TYLENOL) 500 MG tablet Take 1,000 mg by mouth every 6 (six) hours as needed for moderate pain.   Yes [provider]  atorvastatin (LIPITOR) 20 MG tablet TAKE 1 TABLET BY MOUTH EVERY DAY 06/14/19  Yes Croitoru, Mihai, MD  carbidopa-levodopa (SINEMET IR) 10-100 MG tablet TAKE 2 TABLETS BY MOUTH TWICE A DAY 12/25/18  Yes Chipper Herb, MD  gabapentin (NEURONTIN) 100 MG capsule TAKE 1 CAPSULE BY MOUTH THREE TIMES A DAY 01/21/19  Yes Kathrynn Ducking, MD  ibuprofen (ADVIL,MOTRIN) 200 MG tablet Take 400 mg by mouth every 6 (six) hours as needed for moderate pain.   Yes [provider]  traMADol (ULTRAM) 50 MG tablet  Take 1 tablet (50 mg total) by mouth every 6 (six) hours as needed. 04/28/18  Yes Petrucelli, Samantha R, PA-C  Vitamin D, Ergocalciferol, (DRISDOL) 1.25 MG (50000 UT) CAPS capsule TAKE 1 CAPSULE BY MOUTH EVERY 7 DAYS 04/03/19  Yes Chipper Herb, MD    ROS:  Out of a complete 14 system review of symptoms, the patient complains only of the following symptoms, and all other reviewed systems are negative.  Tremor Low back pain  Blood pressure 106/67, pulse 88, temperature 98.7 F (37.1 C), temperature source Temporal, height 5\' 10"  (1.778 m), weight 205 lb (93 kg).  Physical Exam  General: The patient is alert and cooperative at the time of the examination.  The patient is moderately obese.  Skin: No significant peripheral edema is noted.   Neurologic Exam   Mental status: The patient is alert and oriented x 3 at the time of the examination. The patient has apparent normal recent and remote memory, with an apparently normal attention span and concentration ability.   Cranial nerves: Facial symmetry is present. Speech is normal, no aphasia or dysarthria is noted. Extraocular movements are full. Visual fields are full.  Mild masking of the face is seen.  Motor: The patient has good strength in all 4 extremities.  Sensory examination: Soft touch sensation is symmetric on the face, arms, and legs.  Coordination: The patient has good finger-nose-finger and heel-to-shin bilaterally.  A prominent resting tremor seen with the right upper extremity.  Gait and station: The patient has a slightly stooped posture, with walking, he has good stride and good turns, prominent tremor involving the right upper extremity with decreased arm swing on the right. Tandem gait is normal. Romberg is negative. No drift is seen.  Reflexes: Deep tendon reflexes are symmetric.   Assessment/Plan:  1.  Parkinson's disease, right-sided features  2.  Chronic low back pain  The patient is severely limited by his low back pain issues.  He does have a very prominent tremor, he may try low-dose Benadryl for this taking 12.5 mg 3 times daily for a couple weeks and then go to 25 mg 3 times daily.  He will remain on his current dose of Sinemet and he will follow-up in 5 months.  He is being followed through a spine center for his low back pain, he has a back brace that has not offered much benefit so far for his pain.  Jill Alexanders MD 07/03/2019 12:11 PM  [facility] [address] [place], [place] [address]  Phone [phone] Fax [phone]

## 2019-07-05 ENCOUNTER — Encounter: Payer: Self-pay | Admitting: *Deleted

## 2019-07-15 ENCOUNTER — Telehealth: Payer: Self-pay

## 2019-07-15 NOTE — Telephone Encounter (Signed)
5 month f/u has been scheduled for 12/24/2018.

## 2019-07-25 ENCOUNTER — Other Ambulatory Visit: Payer: Self-pay | Admitting: Family Medicine

## 2019-07-31 ENCOUNTER — Ambulatory Visit: Payer: Medicare Other | Admitting: Family Medicine

## 2019-08-05 DIAGNOSIS — M48061 Spinal stenosis, lumbar region without neurogenic claudication: Secondary | ICD-10-CM | POA: Diagnosis not present

## 2019-08-05 DIAGNOSIS — M545 Low back pain: Secondary | ICD-10-CM | POA: Diagnosis not present

## 2019-08-05 DIAGNOSIS — M5136 Other intervertebral disc degeneration, lumbar region: Secondary | ICD-10-CM | POA: Diagnosis not present

## 2019-08-05 DIAGNOSIS — M47816 Spondylosis without myelopathy or radiculopathy, lumbar region: Secondary | ICD-10-CM | POA: Diagnosis not present

## 2019-08-29 DIAGNOSIS — M47816 Spondylosis without myelopathy or radiculopathy, lumbar region: Secondary | ICD-10-CM | POA: Diagnosis not present

## 2019-09-06 ENCOUNTER — Ambulatory Visit (INDEPENDENT_AMBULATORY_CARE_PROVIDER_SITE_OTHER): Payer: Medicare Other | Admitting: *Deleted

## 2019-09-06 DIAGNOSIS — I5022 Chronic systolic (congestive) heart failure: Secondary | ICD-10-CM | POA: Diagnosis not present

## 2019-09-06 LAB — CUP PACEART REMOTE DEVICE CHECK
Battery Remaining Longevity: 94 mo
Battery Voltage: 2.98 V
Brady Statistic AP VP Percent: 93.51 %
Brady Statistic AP VS Percent: 0.05 %
Brady Statistic AS VP Percent: 2.21 %
Brady Statistic AS VS Percent: 4.23 %
Brady Statistic RA Percent Paced: 94.28 %
Brady Statistic RV Percent Paced: 95.71 %
Date Time Interrogation Session: 20200918060918
Implantable Lead Implant Date: 20171211
Implantable Lead Implant Date: 20171211
Implantable Lead Implant Date: 20171211
Implantable Lead Location: 753858
Implantable Lead Location: 753859
Implantable Lead Location: 753860
Implantable Lead Model: 4298
Implantable Lead Model: 5076
Implantable Lead Model: 5076
Implantable Pulse Generator Implant Date: 20171211
Lead Channel Impedance Value: 323 Ohm
Lead Channel Impedance Value: 342 Ohm
Lead Channel Impedance Value: 380 Ohm
Lead Channel Impedance Value: 418 Ohm
Lead Channel Impedance Value: 456 Ohm
Lead Channel Impedance Value: 475 Ohm
Lead Channel Impedance Value: 589 Ohm
Lead Channel Impedance Value: 608 Ohm
Lead Channel Impedance Value: 703 Ohm
Lead Channel Impedance Value: 798 Ohm
Lead Channel Impedance Value: 893 Ohm
Lead Channel Impedance Value: 912 Ohm
Lead Channel Impedance Value: 931 Ohm
Lead Channel Impedance Value: 931 Ohm
Lead Channel Pacing Threshold Amplitude: 0.5 V
Lead Channel Pacing Threshold Amplitude: 1.125 V
Lead Channel Pacing Threshold Amplitude: 1.125 V
Lead Channel Pacing Threshold Pulse Width: 0.4 ms
Lead Channel Pacing Threshold Pulse Width: 0.4 ms
Lead Channel Pacing Threshold Pulse Width: 0.4 ms
Lead Channel Sensing Intrinsic Amplitude: 14.625 mV
Lead Channel Sensing Intrinsic Amplitude: 14.625 mV
Lead Channel Sensing Intrinsic Amplitude: 2 mV
Lead Channel Sensing Intrinsic Amplitude: 2 mV
Lead Channel Setting Pacing Amplitude: 1.5 V
Lead Channel Setting Pacing Amplitude: 1.75 V
Lead Channel Setting Pacing Amplitude: 2.25 V
Lead Channel Setting Pacing Pulse Width: 0.4 ms
Lead Channel Setting Pacing Pulse Width: 0.4 ms
Lead Channel Setting Sensing Sensitivity: 0.9 mV

## 2019-09-09 ENCOUNTER — Encounter: Payer: Self-pay | Admitting: Cardiology

## 2019-09-09 NOTE — Progress Notes (Signed)
Remote pacemaker transmission.   

## 2019-09-11 ENCOUNTER — Ambulatory Visit: Payer: Medicare Other | Admitting: Family Medicine

## 2019-09-12 DIAGNOSIS — G894 Chronic pain syndrome: Secondary | ICD-10-CM | POA: Diagnosis not present

## 2019-09-12 DIAGNOSIS — M48061 Spinal stenosis, lumbar region without neurogenic claudication: Secondary | ICD-10-CM | POA: Diagnosis not present

## 2019-09-18 DIAGNOSIS — M419 Scoliosis, unspecified: Secondary | ICD-10-CM | POA: Diagnosis not present

## 2019-09-18 DIAGNOSIS — G20 Parkinson's disease: Secondary | ICD-10-CM | POA: Diagnosis not present

## 2019-09-18 DIAGNOSIS — Z950 Presence of cardiac pacemaker: Secondary | ICD-10-CM | POA: Diagnosis not present

## 2019-09-18 DIAGNOSIS — G629 Polyneuropathy, unspecified: Secondary | ICD-10-CM | POA: Diagnosis not present

## 2019-09-18 DIAGNOSIS — R350 Frequency of micturition: Secondary | ICD-10-CM | POA: Diagnosis not present

## 2019-09-23 DIAGNOSIS — M545 Low back pain: Secondary | ICD-10-CM | POA: Diagnosis not present

## 2019-10-01 DIAGNOSIS — M545 Low back pain: Secondary | ICD-10-CM | POA: Diagnosis not present

## 2019-10-01 DIAGNOSIS — Z23 Encounter for immunization: Secondary | ICD-10-CM | POA: Diagnosis not present

## 2019-10-04 DIAGNOSIS — M545 Low back pain: Secondary | ICD-10-CM | POA: Diagnosis not present

## 2019-10-07 ENCOUNTER — Telehealth: Payer: Self-pay | Admitting: *Deleted

## 2019-10-07 NOTE — Telephone Encounter (Signed)
Patient was returning a call from [place] in regards to a reading from his monitor. The number for the device clinic rang back to the main line. Please call to discuss the monitor readings

## 2019-10-07 NOTE — Telephone Encounter (Signed)
Please make an appoint to discuss anticoagulation?  Virtual visit is okay if he agrees to it.

## 2019-10-07 NOTE — Telephone Encounter (Signed)
Manual transmission received. AF episode was 9.5hrs duration on 10/04/19. No other events since that date.  Spoke with patient and wife. Patient asymptomatic with episode, able to attend PT session that afternoon without any noticing any issues. Agreeable to virtual visit with Dr. Sallyanne Kuster on 10/30/19 at 9:40am to discuss [place]. No further questions at this time, aware to call back with any concerns in the interim.

## 2019-10-07 NOTE — Telephone Encounter (Signed)
CareAlert received for new AF >=6hrs on 10/04/19. Presenting rhythm as of 09:40 shows AF/BiVP (with occasional VSPR). AF episode began on 10/16 at 03:19. No OAC per med list. V rates controlled. 4 NSVT episodes also noted, some show 1:1 SVT, others NSVT with V-A conduction, longest 2sec. Routed to Dr. Sallyanne Kuster for review and recommendations.  LMOVM requesting call back from patient. Spoke with patient's wife (DPR), made her aware of findings. Requested manual transmission to ensure episode terminated and call back from patient to discuss symptoms. Direct DC number given.

## 2019-10-08 DIAGNOSIS — M545 Low back pain: Secondary | ICD-10-CM | POA: Diagnosis not present

## 2019-10-11 DIAGNOSIS — M545 Low back pain: Secondary | ICD-10-CM | POA: Diagnosis not present

## 2019-10-18 DIAGNOSIS — M545 Low back pain: Secondary | ICD-10-CM | POA: Diagnosis not present

## 2019-10-25 ENCOUNTER — Other Ambulatory Visit: Payer: Self-pay | Admitting: Family Medicine

## 2019-10-30 ENCOUNTER — Encounter: Payer: Self-pay | Admitting: Cardiovascular Disease

## 2019-10-30 ENCOUNTER — Telehealth (INDEPENDENT_AMBULATORY_CARE_PROVIDER_SITE_OTHER): Payer: Medicare Other | Admitting: Cardiovascular Disease

## 2019-10-30 VITALS — BP 105/68 | HR 70 | Ht 70.0 in | Wt 204.0 lb

## 2019-10-30 DIAGNOSIS — I480 Paroxysmal atrial fibrillation: Secondary | ICD-10-CM | POA: Diagnosis not present

## 2019-10-30 DIAGNOSIS — I700 Atherosclerosis of aorta: Secondary | ICD-10-CM

## 2019-10-30 DIAGNOSIS — I472 Ventricular tachycardia: Secondary | ICD-10-CM

## 2019-10-30 DIAGNOSIS — Z950 Presence of cardiac pacemaker: Secondary | ICD-10-CM | POA: Diagnosis not present

## 2019-10-30 DIAGNOSIS — I5032 Chronic diastolic (congestive) heart failure: Secondary | ICD-10-CM

## 2019-10-30 DIAGNOSIS — G20 Parkinson's disease: Secondary | ICD-10-CM

## 2019-10-30 DIAGNOSIS — I2510 Atherosclerotic heart disease of native coronary artery without angina pectoris: Secondary | ICD-10-CM

## 2019-10-30 DIAGNOSIS — I25810 Atherosclerosis of coronary artery bypass graft(s) without angina pectoris: Secondary | ICD-10-CM

## 2019-10-30 NOTE — Progress Notes (Signed)
Virtual Visit via Telephone Note   This visit type was conducted due to national recommendations for restrictions regarding the COVID-19 Pandemic (e.g. social distancing) in an effort to limit this patient's exposure and mitigate transmission in our community.  Due to his co-morbid illnesses, this patient is at least at moderate risk for complications without adequate follow up.  This format is felt to be most appropriate for this patient at this time.  The patient did not have access to video technology/had technical difficulties with video requiring transitioning to audio format only (telephone).  All issues noted in this document were discussed and addressed.  No physical exam could be performed with this format.  Please refer to the patient's chart for his  consent to telehealth for [facility].   Unable to establish video connection due to lack of patient equipment.  Date:  10/30/2019   ID:  Marcus Fuller, DOB November 25, 1941, MRN [id number]  Patient Location: Home Provider Location: Home  PCP:  Baruch Gouty, FNP  Cardiologist:  Monnie Anspach Electrophysiologist:  None   Evaluation Performed:  Follow-Up Visit  Chief Complaint:  Follow up CAD/CHF/CRT-P  History of Present Illness:    Marcus Fuller is a 78 y.o. male with a remote history of coronary artery disease, here for follow-up of CHF and CRT pacemaker.  He has no cardiovascular complaints.  Physical activity is primarily limited by back pain.  He has just completed physical therapy.  He denies exertional angina or exertional dyspnea.  He does not have orthopnea, PND, edema, palpitations or syncope.  He has had a couple of stumbles but no serious falls and no bleeding problems.  His pacemaker is seen on episode of paroxysmal atrial fibrillation lasting for about 2 hours, occurring on September 12 around 10 AM.  Ventricular rate was controlled at 86 bpm and he has no recollection any complaints.  He does not have a history of  stroke or TIA, but has symptoms of Parkinson's disease.  He reports that he had an episode of atrial fibrillation diagnosed 5 years ago in [place], [place].  This caused chest discomfort and he went to the emergency room.  While there he converted spontaneously to normal rhythm.  I have not seen evidence of atrial fibrillation on his device since implantation in 2017.  He has had some spells of very brief nonsustained paroxysmal atrial tachycardia.  His pacemaker shows roughly 98% biventricular pacing as well as 93% atrial pacing.  He still has occasional episodes of atrial tachycardia and some very rare episodes of nonsustained VT.  Battery longevity is estimated at about 8 years.  Lead parameters are excellent.   He had a dual-chamber biventricular pacemaker implanted in November 2017 (Medtronic Percepta CRT-P).  The device was previously followed by Dr. Arminda Resides, MD, [phone].  The patient does not have symptoms concerning for COVID-19 infection (fever, chills, cough, or new shortness of breath).    Past Medical History:  Diagnosis Date  . Anxiety   . Cataract    beginning stages  . Chronic low back pain 07/03/2019  . Coronary artery disease    stent- 2007   . History of kidney stones   . Hypertension   . Parkinson's disease ([place]) 01/29/2018  . Presence of permanent cardiac pacemaker    2017   . Tremor of right hand    Past Surgical History:  Procedure Laterality Date  . APPENDECTOMY    . bbb    . CHOLECYSTECTOMY N/A  05/21/2018   Procedure: LAPAROSCOPIC CHOLECYSTECTOMY;  Surgeon: Coralie Keens, MD;  Location: [facility];  Service: General;  Laterality: N/A;  . left knee arthroscopy   1996  . PACEMAKER INSERTION  11/2016  . STENT PLACEMENT VASCULAR ([place] [facility])  2007  . TONSILLECTOMY       Current Meds  Medication Sig  . acetaminophen (TYLENOL) 500 MG tablet Take 1,000 mg by mouth every 6 (six) hours as needed for moderate pain.  Marland Kitchen atorvastatin (LIPITOR) 20  MG tablet TAKE 1 TABLET BY MOUTH EVERY DAY  . carbidopa-levodopa (SINEMET IR) 10-100 MG tablet Take 2 tablets by mouth 2 (two) times daily. (Need to be seen by new provider)  . gabapentin (NEURONTIN) 300 MG capsule Take 1 capsule (300 mg total) by mouth 3 (three) times daily.  Marland Kitchen ibuprofen (ADVIL,MOTRIN) 200 MG tablet Take 400 mg by mouth every 6 (six) hours as needed for moderate pain.  . traMADol (ULTRAM) 50 MG tablet Take 1 tablet (50 mg total) by mouth every 6 (six) hours as needed.  . Vitamin D, Ergocalciferol, (DRISDOL) 1.25 MG (50000 UT) CAPS capsule TAKE 1 CAPSULE BY MOUTH EVERY 7 DAYS     Allergies:   Codeine   Social History   Tobacco Use  . Smoking status: Never Smoker  . Smokeless tobacco: Never Used  Substance Use Topics  . Alcohol use: Yes    Comment: social   . Drug use: No     Family Hx: The patient's family history includes Atrial fibrillation in his sister; Cancer in his mother; Diabetes in his mother; Heart attack in his father and paternal grandfather; Heart disease in his brother, father, and mother; Pancreatic disease in his brother; Spina bifida in his brother; Tremor in his brother.  ROS:   Please see the history of present illness.     All other systems are reviewed and are negative.   Prior CV studies:   The following studies were reviewed today:  Most recent pacemaker download from Sept 18, 2020  Labs/Other Tests and Data Reviewed:    EKG:  An ECG dated 05/09/2018 was personally reviewed today and demonstrated:  Atrial paced, biventricular paced rhythm with prominent R waves in lead V1 V2 consistent with left ventricular capture  Recent Labs: 12/28/2018: ALT 42; BUN 15; Creatinine, Ser 1.00; Hemoglobin 15.5; Platelets 165; Potassium 4.2; Sodium 141   Recent Lipid Panel Lab Results  Component Value Date/Time   CHOL 209 (H) 12/28/2018 12:45 PM   TRIG 233 (H) 12/28/2018 12:45 PM   HDL 37 (L) 12/28/2018 12:45 PM   CHOLHDL 5.6 (H) 12/28/2018 12:45  PM   LDLCALC 125 (H) 12/28/2018 12:45 PM    Wt Readings from Last 3 Encounters:  10/30/19 204 lb (92.5 kg)  07/03/19 205 lb (93 kg)  05/15/19 205 lb (93 kg)     Objective:    Vital Signs:  BP 105/68   Pulse 70   Ht 5\' 10"  (1.778 m)   Wt 204 lb (92.5 kg)   BMI 29.27 kg/m    VITAL SIGNS:  reviewed Unable to examine  ASSESSMENT & PLAN:    1. AFib: This is his first clinical episode of atrial fibrillation in 5 years and the first episode of sustained arrhythmia documented by his pacemaker in over 3 years.  We discussed the risks of stroke with atrial fibrillation and reviewed the fact that this is likely to recur. CHADSVasc 3 (age 72, vascular disease).    I recommended that he consider  anticoagulation.  He does often require NSAIDs for his orthopedic/back problems, which would slightly increase his risk of bleeding.  After some discussion we decided to wait and see if the prevalence of arrhythmia increases.  We will ask our device clinic to see if we can turn off atrial fibrillation alerts remotely.  I advised him to promptly report any focal neurological complaints consistent with TIA or CVA.  He should call even if these events are very brief and were completely reversible. CHF: Both clinically and by review of his OptiVol tracing he is euvolemic.  Most recent EF was excellent at 50%.  Has good biventricular pacing percentage. 2. CAD: He does not have any complaints of exertional angina.  He has occasional chest discomfort when he turns over in bed which is clearly musculoskeletal. 3. CRT-P: Normal device function.  Continue remote downloads every 3 months and yearly office visit.  We will try to see if we can turn off A. fib alerts without bring him to the clinic. 4. HLP: His most recent lipid profile was performed when he was briefly off the statin, but he has restarted it.  When he was taking it daily his LDL cholesterol was in the 60s and I suspect it is back at that level now.  He will  have labs with his primary care provider. 5. Aortic atherosclerosis: Asymptomatic.  Without evidence of aneurysm.  On statin.  COVID-19 Education: The signs and symptoms of COVID-19 were discussed with the patient and how to seek care for testing (follow up with PCP or arrange E-visit).  The importance of social distancing was discussed today.  Time:   Today, I have spent 26 minutes with the patient with telehealth technology discussing the above problems.     Medication Adjustments/Labs and Tests Ordered: Current medicines are reviewed at length with the patient today.  Concerns regarding medicines are outlined above.   Tests Ordered: No orders of the defined types were placed in this encounter.   Medication Changes: No orders of the defined types were placed in this encounter.  Patient Instructions  Medication Instructions:  No changes *If you need a refill on your cardiac medications before your next appointment, please call your pharmacy*  Lab Work: None ordered If you have labs (blood work) drawn today and your tests are completely normal, you will receive your results only by: Marland Kitchen MyChart Message (if you have MyChart) OR . A paper copy in the mail If you have any lab test that is abnormal or we need to change your treatment, we will call you to review the results.  Testing/Procedures: None ordered  Follow-Up: At [facility], you and your health needs are our priority.  As part of our continuing mission to provide you with exceptional heart care, we have created designated Provider Care Teams.  These Care Teams include your primary Cardiologist (physician) and Advanced Practice Providers (APPs -  Physician Assistants and Nurse Practitioners) who all work together to provide you with the care you need, when you need it.  Your next appointment:   12 months  The format for your next appointment:   In Person  Provider:   Sanda Klein, MD       Disposition:   Follow up 12 months  Signed, Sanda Klein, MD  10/30/2019 11:29 AM    [place]

## 2019-10-30 NOTE — Patient Instructions (Signed)

## 2019-11-27 ENCOUNTER — Other Ambulatory Visit: Payer: Self-pay | Admitting: Family Medicine

## 2019-11-28 NOTE — Telephone Encounter (Signed)
Former [place] by new provider. 30 days given 10/25/19

## 2019-11-28 NOTE — Telephone Encounter (Signed)
He is no longer a patient here!

## 2019-12-02 ENCOUNTER — Telehealth: Payer: Self-pay | Admitting: Cardiovascular Disease

## 2019-12-02 NOTE — Telephone Encounter (Signed)
I spoke with pt. He reports he initially tested negative for covid but when retested he was positive. Wife also positive. [facility] and [facility] in [facility] (care everywhere). Pt requests I go over instructions provided by [facility] with him.  I read instructions in note to him. Pt reports while speaking with me he was able to read these instructions in my chart.  I advised pt to contact primary care for symptom management. I advised him to go to ED if he develops chest pain or shortness of breath.

## 2019-12-02 NOTE — Telephone Encounter (Signed)
New Message  Patient is calling in and states that he just tested positive for COVID-19 and wanted to know if there is anything specifically that he needs to do,. Please give patient a call back to discuss.

## 2019-12-06 ENCOUNTER — Ambulatory Visit (INDEPENDENT_AMBULATORY_CARE_PROVIDER_SITE_OTHER): Payer: Medicare Other | Admitting: *Deleted

## 2019-12-06 DIAGNOSIS — Z950 Presence of cardiac pacemaker: Secondary | ICD-10-CM | POA: Diagnosis not present

## 2019-12-06 LAB — CUP PACEART REMOTE DEVICE CHECK
Battery Remaining Longevity: 89 mo
Battery Voltage: 2.97 V
Brady Statistic AP VP Percent: 91.27 %
Brady Statistic AP VS Percent: 0.04 %
Brady Statistic AS VP Percent: 3.28 %
Brady Statistic AS VS Percent: 5.41 %
Brady Statistic RA Percent Paced: 92.31 %
Brady Statistic RV Percent Paced: 94.55 %
Date Time Interrogation Session: 20201218010625
Implantable Lead Implant Date: 20171211
Implantable Lead Implant Date: 20171211
Implantable Lead Implant Date: 20171211
Implantable Lead Location: 753858
Implantable Lead Location: 753859
Implantable Lead Location: 753860
Implantable Lead Model: 4298
Implantable Lead Model: 5076
Implantable Lead Model: 5076
Implantable Pulse Generator Implant Date: 20171211
Lead Channel Impedance Value: 323 Ohm
Lead Channel Impedance Value: 342 Ohm
Lead Channel Impedance Value: 399 Ohm
Lead Channel Impedance Value: 399 Ohm
Lead Channel Impedance Value: 437 Ohm
Lead Channel Impedance Value: 437 Ohm
Lead Channel Impedance Value: 513 Ohm
Lead Channel Impedance Value: 551 Ohm
Lead Channel Impedance Value: 646 Ohm
Lead Channel Impedance Value: 760 Ohm
Lead Channel Impedance Value: 817 Ohm
Lead Channel Impedance Value: 836 Ohm
Lead Channel Impedance Value: 855 Ohm
Lead Channel Impedance Value: 874 Ohm
Lead Channel Pacing Threshold Amplitude: 0.5 V
Lead Channel Pacing Threshold Amplitude: 1.125 V
Lead Channel Pacing Threshold Amplitude: 1.25 V
Lead Channel Pacing Threshold Pulse Width: 0.4 ms
Lead Channel Pacing Threshold Pulse Width: 0.4 ms
Lead Channel Pacing Threshold Pulse Width: 0.4 ms
Lead Channel Sensing Intrinsic Amplitude: 1.625 mV
Lead Channel Sensing Intrinsic Amplitude: 1.625 mV
Lead Channel Sensing Intrinsic Amplitude: 16.25 mV
Lead Channel Sensing Intrinsic Amplitude: 16.25 mV
Lead Channel Setting Pacing Amplitude: 1.5 V
Lead Channel Setting Pacing Amplitude: 1.75 V
Lead Channel Setting Pacing Amplitude: 2.25 V
Lead Channel Setting Pacing Pulse Width: 0.4 ms
Lead Channel Setting Pacing Pulse Width: 0.4 ms
Lead Channel Setting Sensing Sensitivity: 0.9 mV

## 2019-12-07 ENCOUNTER — Other Ambulatory Visit: Payer: Self-pay | Admitting: Family Medicine

## 2019-12-16 ENCOUNTER — Telehealth: Payer: Self-pay | Admitting: Neurology

## 2019-12-16 MED ORDER — CARBIDOPA-LEVODOPA 10-100 MG PO TABS: 2.0000 | ORAL_TABLET | Freq: Two times a day (BID) | ORAL | 0 refills | Status: DC

## 2019-12-16 NOTE — Telephone Encounter (Signed)
Pt is needing a refill on his carbidopa-levodopa (SINEMET IR) 10-100 MG tablet that was prescribed by his PCP that has since retired. Pt is [facility] to [facility] on [address].

## 2019-12-16 NOTE — Telephone Encounter (Signed)
Refill is appropriate and [facility]

## 2019-12-18 ENCOUNTER — Telehealth: Payer: Self-pay

## 2019-12-18 NOTE — Telephone Encounter (Addendum)
Called pt to inform them that we canceled their appt but to expect a call back for staff to r/s.  Provider ooo

## 2019-12-24 IMAGING — US US ABDOMEN LIMITED
1 series · 14 of 25 positions shown · non-contrast
Comparison: None.

CLINICAL DATA: Abdominal pain since 4 a.m..

EXAM:
ULTRASOUND ABDOMEN LIMITED RIGHT UPPER QUADRANT

[Series 1: us abdomen limited · 0.28mm/px · 14 of 31 slices shown]
[im 1/31]
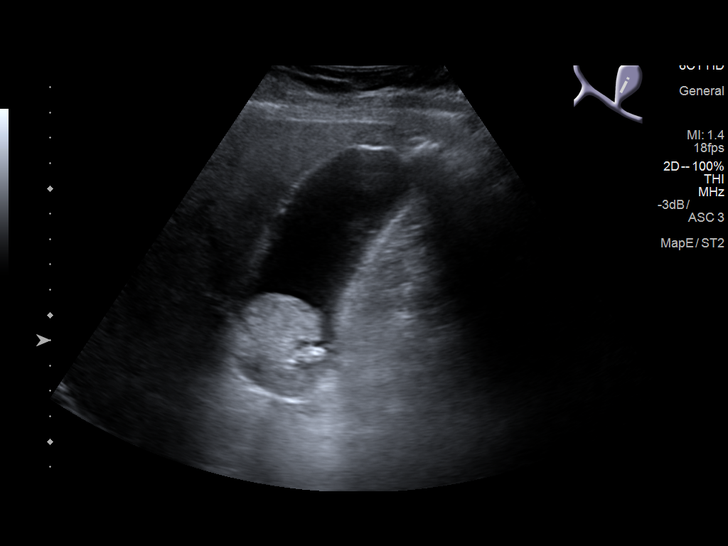
[im 3/31]
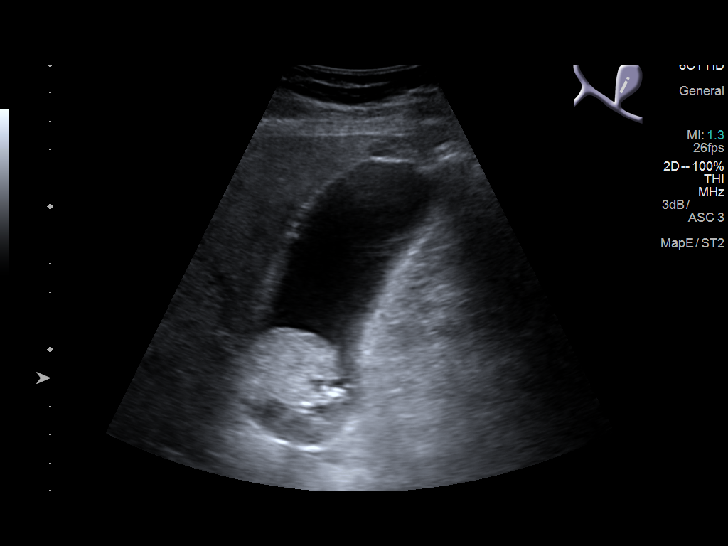
[im 6/31]
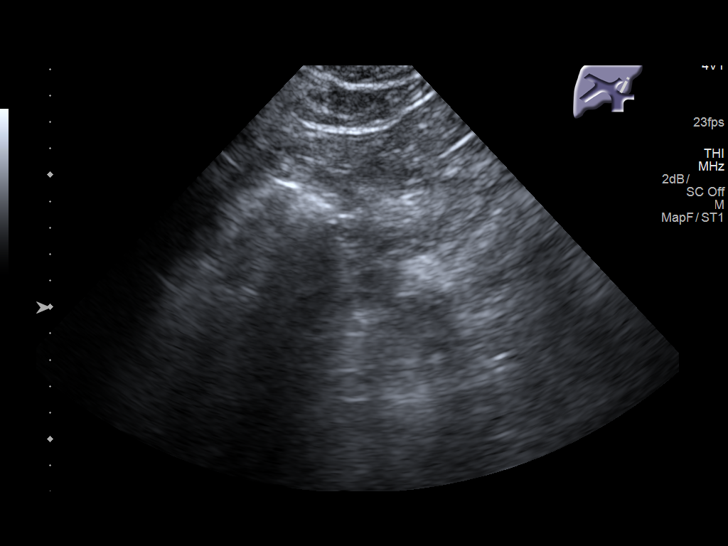
[im 8/31]
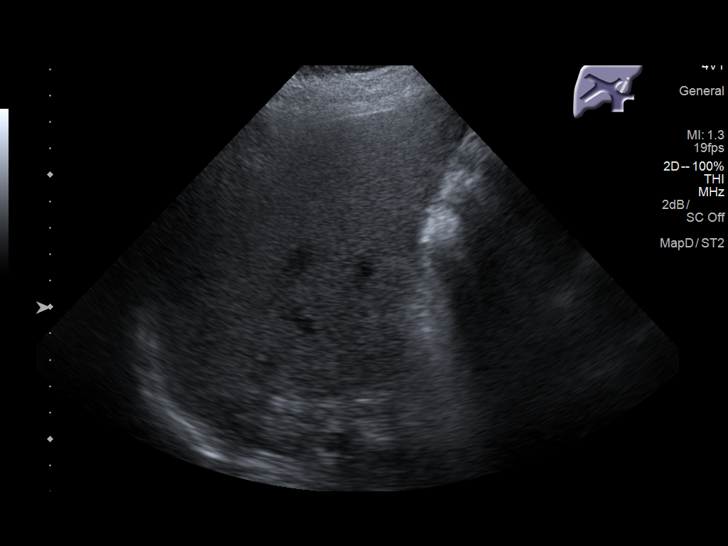
[im 11/31]
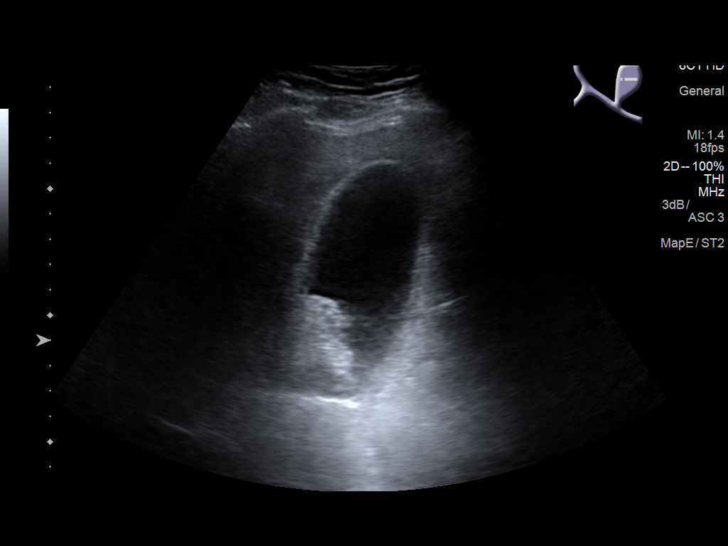
[im 12/31]
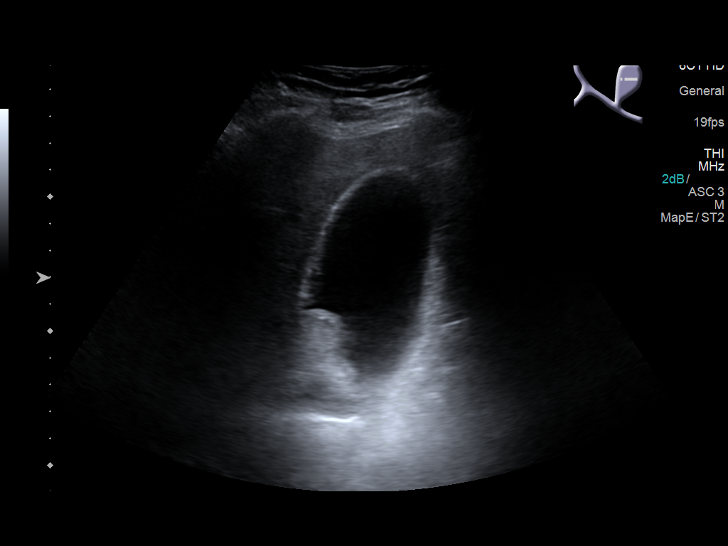
[im 14/31]
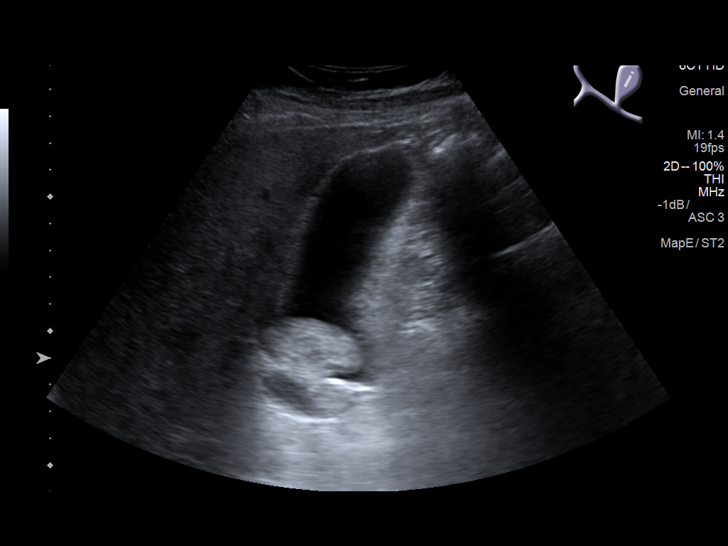
[im 17/31]
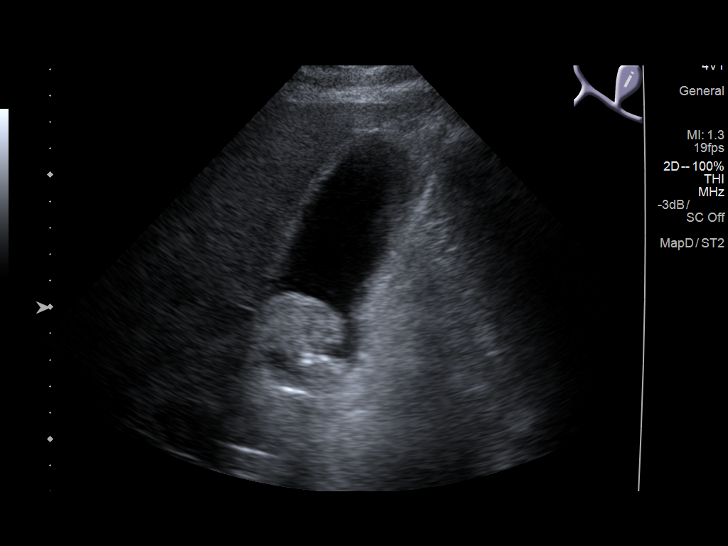
[im 19/31]
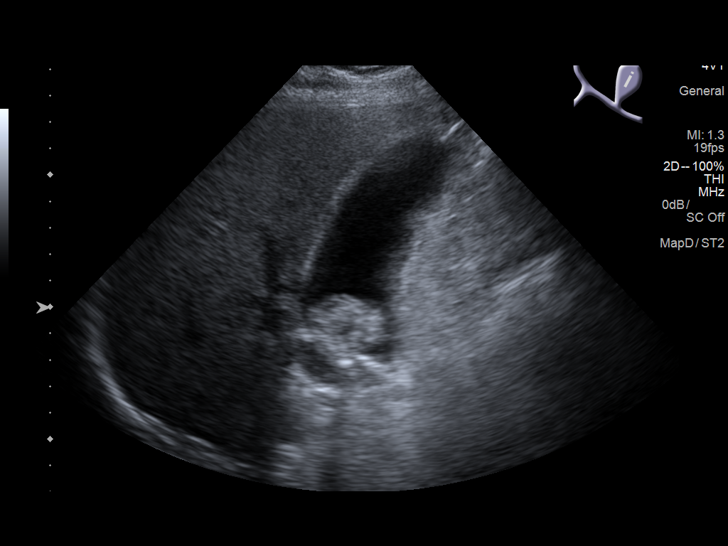
[im 21/31]
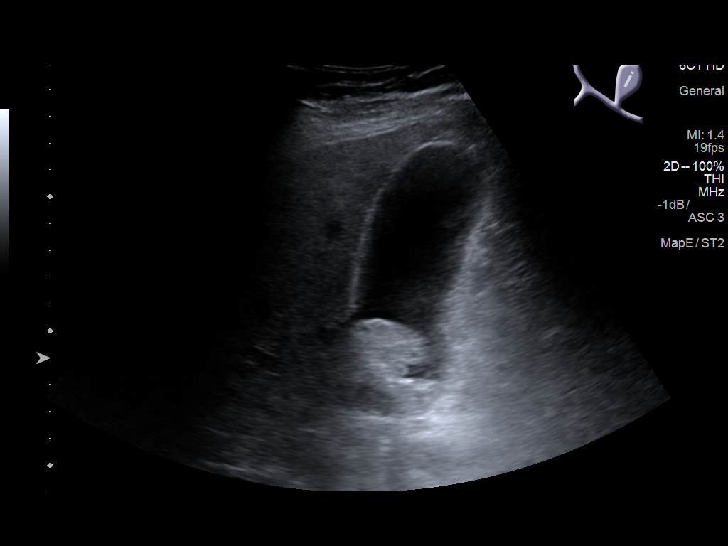
[im 23/31]
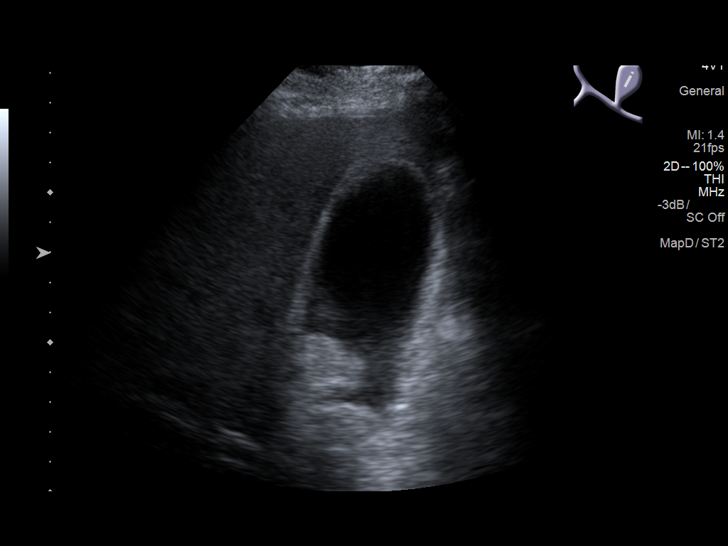
[im 26/31]
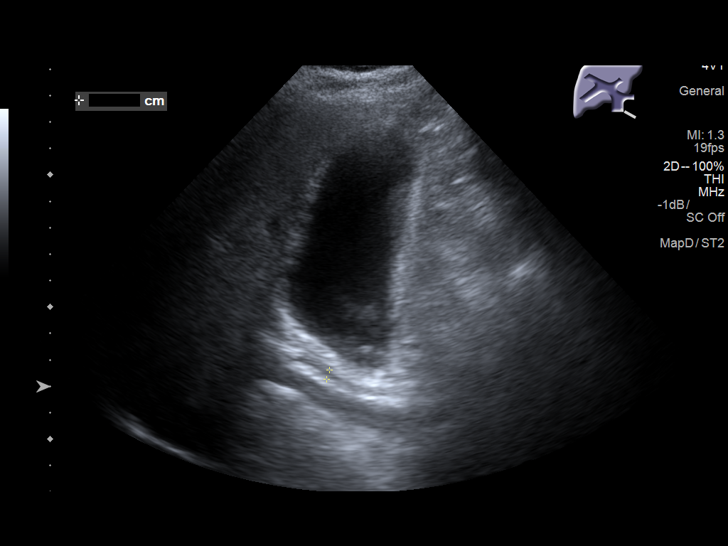
[im 28/31]
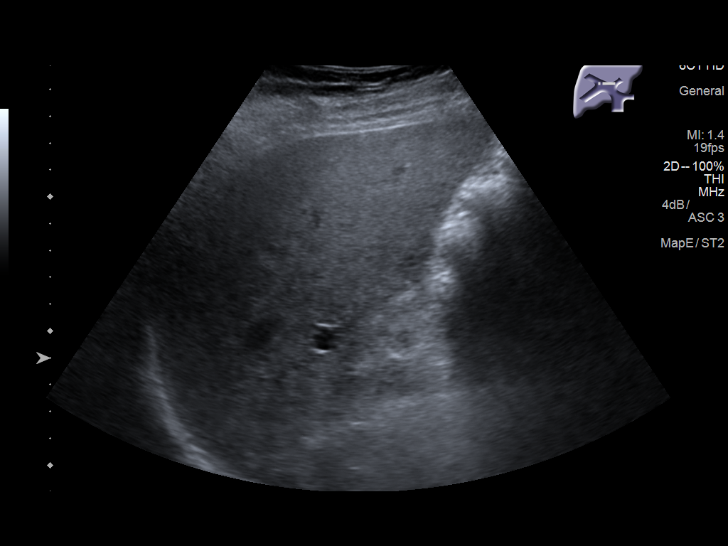
[im 31/31]
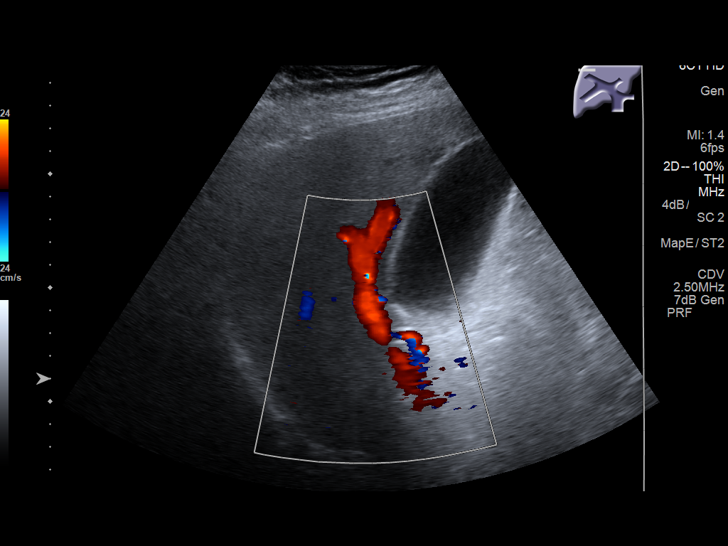

[14 of 25 positions shown; findings below may reference images not displayed]

FINDINGS: Gallbladder:

Gallbladder wall is UPPER limits normal, 2.7 millimeters. There is
no pericholecystic fluid. Within the dependent aspect of the
gallbladder, there is an echogenic nonshadowing mass measuring
centimeters consistent with tumefactive sludge. Additionally small
echogenic shadowing stones are identified, measuring 7 millimeters
in diameter. No sonographic Murphy sign.

Common bile duct:

Diameter: 3.7 millimeters

Liver:

Visualized portion is unremarkable. LEFT hepatic lobe is obscured by
bowel gas.

Portal vein is patent on color Doppler imaging with normal direction
of blood flow towards the liver.

Additional:

Study quality is degraded by patient body habitus.
IMPRESSION: 1. Tumefactive sludge and gallstones.
2. No other evidence for acute cholecystitis.

## 2019-12-25 ENCOUNTER — Ambulatory Visit: Payer: Self-pay | Admitting: Neurology

## 2019-12-25 NOTE — Progress Notes (Signed)
PPM remote 

## 2020-01-08 ENCOUNTER — Other Ambulatory Visit: Payer: Self-pay | Admitting: Neurology

## 2020-01-21 ENCOUNTER — Ambulatory Visit: Payer: Self-pay | Admitting: Neurology

## 2020-01-23 ENCOUNTER — Other Ambulatory Visit: Payer: Self-pay | Admitting: Neurology

## 2020-01-27 ENCOUNTER — Other Ambulatory Visit: Payer: Self-pay | Admitting: Neurology

## 2020-02-17 ENCOUNTER — Ambulatory Visit: Payer: Medicare Other

## 2020-02-28 ENCOUNTER — Ambulatory Visit: Payer: Medicare Other | Attending: Internal Medicine

## 2020-02-28 DIAGNOSIS — Z23 Encounter for immunization: Secondary | ICD-10-CM

## 2020-02-28 NOTE — Progress Notes (Signed)
   Covid-19 Vaccination Clinic  Name:  LINK BURGESON    MRN: [id number] DOB: Apr 18, 1941  02/28/2020  Mr. Holt was observed post Covid-19 immunization for 15 minutes without incident. He was provided with Vaccine Information Sheet and instruction to access the V-Safe system.   Mr. Claxton was instructed to call 911 with any severe reactions post vaccine: Marland Kitchen Difficulty breathing  . Swelling of face and throat  . A fast heartbeat  . A bad rash all over body  . Dizziness and weakness   Immunizations Administered    Name Date Dose VIS Date Route   Pfizer COVID-19 Vaccine 02/28/2020 11:02 AM 0.3 mL 11/29/2019 Intramuscular   Manufacturer: [place]   Lot: [id number]   [place]: [id number]

## 2020-03-06 ENCOUNTER — Telehealth: Payer: Self-pay | Admitting: Student

## 2020-03-06 ENCOUNTER — Ambulatory Visit (INDEPENDENT_AMBULATORY_CARE_PROVIDER_SITE_OTHER): Payer: Medicare Other | Admitting: *Deleted

## 2020-03-06 DIAGNOSIS — Z950 Presence of cardiac pacemaker: Secondary | ICD-10-CM

## 2020-03-06 LAB — CUP PACEART REMOTE DEVICE CHECK
Battery Remaining Longevity: 71 mo
Battery Voltage: 2.96 V
Brady Statistic AP VP Percent: 91.07 %
Brady Statistic AP VS Percent: 0.05 %
Brady Statistic AS VP Percent: 3.64 %
Brady Statistic AS VS Percent: 5.24 %
Brady Statistic RA Percent Paced: 84.95 %
Brady Statistic RV Percent Paced: 94.06 %
Date Time Interrogation Session: 20210319020733
Implantable Lead Implant Date: 20171211
Implantable Lead Implant Date: 20171211
Implantable Lead Implant Date: 20171211
Implantable Lead Location: 753858
Implantable Lead Location: 753859
Implantable Lead Location: 753860
Implantable Lead Model: 4298
Implantable Lead Model: 5076
Implantable Lead Model: 5076
Implantable Pulse Generator Implant Date: 20171211
Lead Channel Impedance Value: 323 Ohm
Lead Channel Impedance Value: 323 Ohm
Lead Channel Impedance Value: 380 Ohm
Lead Channel Impedance Value: 399 Ohm
Lead Channel Impedance Value: 456 Ohm
Lead Channel Impedance Value: 475 Ohm
Lead Channel Impedance Value: 551 Ohm
Lead Channel Impedance Value: 551 Ohm
Lead Channel Impedance Value: 665 Ohm
Lead Channel Impedance Value: 817 Ohm
Lead Channel Impedance Value: 836 Ohm
Lead Channel Impedance Value: 855 Ohm
Lead Channel Impedance Value: 855 Ohm
Lead Channel Impedance Value: 912 Ohm
Lead Channel Pacing Threshold Amplitude: 0.5 V
Lead Channel Pacing Threshold Amplitude: 1.125 V
Lead Channel Pacing Threshold Amplitude: 2.125 V
Lead Channel Pacing Threshold Pulse Width: 0.4 ms
Lead Channel Pacing Threshold Pulse Width: 0.4 ms
Lead Channel Pacing Threshold Pulse Width: 0.4 ms
Lead Channel Sensing Intrinsic Amplitude: 1.75 mV
Lead Channel Sensing Intrinsic Amplitude: 1.75 mV
Lead Channel Sensing Intrinsic Amplitude: 11.875 mV
Lead Channel Sensing Intrinsic Amplitude: 11.875 mV
Lead Channel Setting Pacing Amplitude: 1.5 V
Lead Channel Setting Pacing Amplitude: 2.25 V
Lead Channel Setting Pacing Amplitude: 3.25 V
Lead Channel Setting Pacing Pulse Width: 0.4 ms
Lead Channel Setting Pacing Pulse Width: 0.4 ms
Lead Channel Setting Sensing Sensitivity: 0.9 mV

## 2020-03-06 NOTE — Progress Notes (Signed)
PPM Remote  

## 2020-03-06 NOTE — Telephone Encounter (Signed)
Discussed recommendations with patient, specifically that we would like to start him on Eliquis due to overall increase in AF burden and long episodes in order to prevent stroke.   He states that he has never felt AF and would like to first read more about the medication, see how much it is going to cost him, and speak with Dr. Sallyanne Kuster about it. I let him know Dr. Victorino December RN would be calling to set that appointment up.   He would prefer in person if possible.   I discussed with him the risk of continuing without [place] for the time being including stroke and deficits from that. I explained the primary risk of taking Eliquis is bleeding, and he would be monitored for this.    I offered to send in the prescription to his local pharmacy, and he could see how much it would be before picking it up, but he would like a visit to discuss prior to this occurring.   He verbalizes understanding or all of the above, and agrees to seek immediate care were he to develop any focal neurological symptoms such slurred speech, facial droop, weakness; especially one sided weakness, or sudden onset confusion.    Marcus Fuller [address]" [place], PA-C  03/06/2020 3:20 PM

## 2020-03-06 NOTE — Telephone Encounter (Signed)
Spoke with the patient. Appointment made for 03/23/20 with Dr. Sallyanne Kuster.

## 2020-03-06 NOTE — Telephone Encounter (Signed)
  Note previous discussion of [place] for CHA2DS2VASC of at least 3. Watchful waiting.   Remote today reviewed and pt had episodes of PAF the second half of December, but has been quiescent since.  Longest episode > 39 hours.      Of note, he reported testing positive for COVID via phone note 12/02/2019.   Will forward to Dr. Sallyanne Kuster as Juluis Rainier for further discussion of [place] with increased length of episodes and previous recommendation of starting [place].  Thank you!

## 2020-03-06 NOTE — Telephone Encounter (Signed)
I think the safest approach is to start Eliquis 5 mg twice daily. If he wants to discuss that further, please arrange follow up in next few weeks. Virtual visit is OK. Thanks

## 2020-03-23 ENCOUNTER — Other Ambulatory Visit: Payer: Self-pay

## 2020-03-23 ENCOUNTER — Ambulatory Visit (INDEPENDENT_AMBULATORY_CARE_PROVIDER_SITE_OTHER): Payer: Medicare Other | Admitting: Cardiovascular Disease

## 2020-03-23 ENCOUNTER — Encounter: Payer: Self-pay | Admitting: Cardiovascular Disease

## 2020-03-23 ENCOUNTER — Telehealth: Payer: Self-pay | Admitting: Cardiovascular Disease

## 2020-03-23 VITALS — BP 115/70 | HR 85 | Ht 70.0 in | Wt 205.4 lb

## 2020-03-23 DIAGNOSIS — Z950 Presence of cardiac pacemaker: Secondary | ICD-10-CM

## 2020-03-23 DIAGNOSIS — I700 Atherosclerosis of aorta: Secondary | ICD-10-CM | POA: Diagnosis not present

## 2020-03-23 DIAGNOSIS — I5032 Chronic diastolic (congestive) heart failure: Secondary | ICD-10-CM | POA: Diagnosis not present

## 2020-03-23 DIAGNOSIS — I480 Paroxysmal atrial fibrillation: Secondary | ICD-10-CM | POA: Diagnosis not present

## 2020-03-23 DIAGNOSIS — E7800 Pure hypercholesterolemia, unspecified: Secondary | ICD-10-CM

## 2020-03-23 DIAGNOSIS — I2510 Atherosclerotic heart disease of native coronary artery without angina pectoris: Secondary | ICD-10-CM | POA: Diagnosis not present

## 2020-03-23 DIAGNOSIS — T82897A Other specified complication of cardiac prosthetic devices, implants and grafts, initial encounter: Secondary | ICD-10-CM | POA: Diagnosis not present

## 2020-03-23 MED ORDER — APIXABAN 5 MG PO TABS: 5.0000 mg | ORAL_TABLET | Freq: Two times a day (BID) | ORAL | 11 refills | Status: DC

## 2020-03-23 NOTE — Telephone Encounter (Signed)
Routing to nurse to make aware.

## 2020-03-23 NOTE — Telephone Encounter (Signed)
Contacted patient wife, advised okay to come to appointment.

## 2020-03-23 NOTE — Patient Instructions (Addendum)
Medication Instructions:  START Eliquis 5 mg twice daily   -samples given (3 boxes)  *If you need a refill on your cardiac medications before your next appointment, please call your pharmacy*   Lab Work: None ordered If you have labs (blood work) drawn today and your tests are completely normal, you will receive your results only by: Marland Kitchen MyChart Message (if you have MyChart) OR . A paper copy in the mail If you have any lab test that is abnormal or we need to change your treatment, we will call you to review the results.   Testing/Procedures: None ordered   Follow-Up: At [facility], you and your health needs are our priority.  As part of our continuing mission to provide you with exceptional heart care, we have created designated Provider Care Teams.  These Care Teams include your primary Cardiologist (physician) and Advanced Practice Providers (APPs -  Physician Assistants and Nurse Practitioners) who all work together to provide you with the care you need, when you need it.  We recommend signing up for the patient portal called "MyChart".  Sign up information is provided on this After Visit Summary.  MyChart is used to connect with patients for Virtual Visits (Telemedicine).  Patients are able to view lab/test results, encounter notes, upcoming appointments, etc.  Non-urgent messages can be sent to your provider as well.   To learn more about what you can do with MyChart, go to [email or web address].    Your next appointment:   3 month(s)  The format for your next appointment:   In Person  Provider:   Sanda Klein, MD

## 2020-03-23 NOTE — Telephone Encounter (Signed)
New Message    Pts wife is calling and says she needs to assist the pt to his appt today because he has parkinson's, back problems and cannot get around very well.    Please advise

## 2020-03-23 NOTE — Progress Notes (Signed)
Cardiology Office Note:    Date:  03/23/2020   ID:  Marcus Fuller, DOB 08-08-1941, MRN [id number]  PCP:  System, Pcp Not In  Cardiologist:  Shanaye Rief  Referring MD: No ref. provider found   No chief complaint on file. CHF, CRT-P   History of Present Illness:    Marcus Fuller is a 79 y.o. male with a remote history of coronary artery disease and stent placement (2007), paroxysmal atrial fibrillation, dual-chamber biventricular pacemaker implanted in November 2017 (Medtronic Percepta CRT-P), low normal left ventricular systolic function (EF 09-38% echo March 2019), LBBB.  [name] had a single episode of atrial fibrillation roughly 5 years ago that resolved spontaneously during ER evaluation.  His pacemaker then showed no evidence of atrial fibrillation for the ensuing 4-5 years.  He had a 2-hour episode of paroxysmal atrial fibrillation in September 2020 that was asymptomatic.  Anticoagulation was offered but declined at that time, with a plan to reviewed the indication if he would have new events.  In December 2020 he had a relatively mild COVID-19 illness but developed atrial fibrillation.  He subsequently developed several back-to-back episodes of paroxysmal atrial fibrillation, lasting a cumulative 6 or 7 days, with the longest individual episode over 1-1/2 days duration.  The rate was always well controlled.  He is here to discuss anticoagulation.  He has not had any focal neurological events (he has chronic parkinsonism with related tremor), denies injuries or falls, serious bleeding problems.  The patient specifically denies any chest pain at rest exertion, dyspnea at rest or with exertion, orthopnea, paroxysmal nocturnal dyspnea, syncope, palpitations, focal neurological deficits, intermittent claudication, lower extremity edema, unexplained weight gain, cough, hemoptysis or wheezing.  He has had palpitations in the left side of his chest when he lies on his right side for  "years".  Pacemaker interrogation today shows normal device function.  The Medtronic Percepta generator has roughly 6 years of remaining longevity.  He has 93% atrial pacing and 96% biventricular pacing (VS RP only 0.2%).  With good lead parameters.  He does describe diaphragmatic stimulation, only when lying on his right side.  This was easily detected today and readily reproducible by turning over on the right.  It was present with LV pacing at 3.5 V at 0.4 ms (per device adaptive capture), but this appeared with pacing at 2.5 V at 0.4 ms.  Adaptive LV pacing was turned off and the LV output was reprogrammed to 1.75 V at 0.6 ms.  (LV 1-LV 2 configuration).  The heart rate histogram distribution is good.  He has not had any recent episodes of nonsustained VT or VT.  ECG with the ventricular pacing turned off shows left bundle branch block with a duration of about 150 ms.   Past Medical History:  Diagnosis Date  . Anxiety   . Cataract    beginning stages  . Chronic low back pain 07/03/2019  . Coronary artery disease    stent- 2007   . History of kidney stones   . Hypertension   . Parkinson's disease ([place]) 01/29/2018  . Presence of permanent cardiac pacemaker    2017   . Tremor of right hand     Past Surgical History:  Procedure Laterality Date  . APPENDECTOMY    . bbb    . CHOLECYSTECTOMY N/A 05/21/2018   Procedure: LAPAROSCOPIC CHOLECYSTECTOMY;  Surgeon: Coralie Keens, MD;  Location: [facility];  Service: General;  Laterality: N/A;  . left knee arthroscopy   1996  .  PACEMAKER INSERTION  11/2016  . STENT PLACEMENT VASCULAR ([place] HX)  2007  . TONSILLECTOMY      Current Medications: Current Meds  Medication Sig  . acetaminophen (TYLENOL) 500 MG tablet Take 1,000 mg by mouth every 6 (six) hours as needed for moderate pain.  Marland Kitchen atorvastatin (LIPITOR) 20 MG tablet TAKE 1 TABLET BY MOUTH EVERY DAY  . carbidopa-levodopa (SINEMET IR) 10-100 MG tablet TAKE 2 TABLETS BY MOUTH 2 (TWO) TIMES  DAILY. (NEED TO BE SEEN BY NEW PROVIDER)  . gabapentin (NEURONTIN) 300 MG capsule TAKE 1 CAPSULE BY MOUTH THREE TIMES A DAY  . Melatonin 5 MG CAPS melatonin  . traMADol (ULTRAM) 50 MG tablet Take 1 tablet (50 mg total) by mouth every 6 (six) hours as needed.     Allergies:   Codeine   Social History   Socioeconomic History  . Marital status: Married    Spouse name: Not on file  . Number of children: 2  . Years of education: 41  . Highest education level: Not on file  Occupational History  . Not on file  Tobacco Use  . Smoking status: Never Smoker  . Smokeless tobacco: Never Used  Substance and Sexual Activity  . Alcohol use: Yes    Comment: social   . Drug use: No  . Sexual activity: Not Currently  Other Topics Concern  . Not on file  Social History Narrative   Lives w/ wife   Caffeine use: sometimes   Right handed    Social Determinants of Health   Financial Resource Strain:   . Difficulty of Paying Living Expenses:   Food Insecurity:   . Worried About Charity fundraiser in the Last Year:   . Arboriculturist in the Last Year:   Transportation Needs:   . Film/video editor (Medical):   Marland Kitchen Lack of Transportation (Non-Medical):   Physical Activity:   . Days of Exercise per Week:   . Minutes of Exercise per Session:   Stress:   . Feeling of Stress :   Social Connections:   . Frequency of Communication with Friends and Family:   . Frequency of Social Gatherings with Friends and Family:   . Attends Religious Services:   . Active Member of Clubs or Organizations:   . Attends Archivist Meetings:   Marland Kitchen Marital Status:      Family History: The patient's family history includes Atrial fibrillation in his sister; Cancer in his mother; Diabetes in his mother; Heart attack in his father and paternal grandfather; Heart disease in his brother, father, and mother; Pancreatic disease in his brother; Spina bifida in his brother; Tremor in his brother.  Reports  that his father died of a heart attack at age 40.  ROS:   Please see the history of present illness.    All other systems are reviewed and are negative.  EKGs/Labs/Other Studies Reviewed:    The following studies were reviewed today: Notes from Dr. Jannifer Franklin and Georgina Pillion Presence of pacemaker check performed in the office by me.  EKG:  EKG is ordered today.  The ekg during BiV pacing shows AV sequential pacing, QRS 197ms with +R in V, QTc 499 ms. During AAI pacing, QRS 148 ms, LBBB.  Recent Labs: No results found for requested labs within last 8760 hours.  Recent Lipid Panel    Component Value Date/Time   CHOL 209 (H) 12/28/2018 1245   TRIG 233 (H) 12/28/2018 1245  HDL 37 (L) 12/28/2018 1245   CHOLHDL 5.6 (H) 12/28/2018 1245   LDLCALC 125 (H) 12/28/2018 1245   02/13/2020 Total cholesterol 98, HDL 31, triglycerides 84, LDL 59  Physical Exam:    VS:  BP 115/70   Pulse 85   Ht 5\' 10"  (1.778 m)   Wt 205 lb 6.4 oz (93.2 kg)   SpO2 97%   BMI 29.47 kg/m     Wt Readings from Last 3 Encounters:  03/23/20 205 lb 6.4 oz (93.2 kg)  10/30/19 204 lb (92.5 kg)  07/03/19 205 lb (93 kg)     General: Alert, oriented x3, no distress, obese.  Healthy subclavian pacemaker site. Head: no evidence of trauma, PERRL, EOMI, no exophtalmos or lid lag, no myxedema, no xanthelasma; normal ears, nose and oropharynx Neck: normal jugular venous pulsations and no hepatojugular reflux; brisk carotid pulses without delay and no carotid bruits Chest: clear to auscultation, no signs of consolidation by percussion or palpation, normal fremitus, symmetrical and full respiratory excursions.  Mild intercostal/diaphragmatic stimulation noted when he lies on the right side. Cardiovascular: normal position and quality of the apical impulse, regular rhythm, normal first and second heart sounds, no murmurs, rubs or gallops Abdomen: no tenderness or distention, no masses by palpation, no abnormal pulsatility or  arterial bruits, normal bowel sounds, no hepatosplenomegaly Extremities: no clubbing, cyanosis or edema; 2+ radial, ulnar and brachial pulses bilaterally; 2+ right femoral, posterior tibial and dorsalis pedis pulses; 2+ left femoral, posterior tibial and dorsalis pedis pulses; no subclavian or femoral bruits Neurological: grossly nonfocal except for resting tremor in both hands Psych: Normal mood and affect    ASSESSMENT:    1. Paroxysmal atrial fibrillation (HCC)   2. Chronic diastolic heart failure ([place])   3. Coronary artery disease involving native coronary artery of native heart without angina pectoris   4. Aortic atherosclerosis ([place])   5. Biventricular cardiac pacemaker in situ   6. Hypercholesterolemia    PLAN:    In order of problems listed above:  1. AFib: recent burden of atrial fibrillation has been quite high, albeit during an acute coronavirus infection.  This is his third documented episode of atrial fibrillation.  I believe he meets criteria for full anticoagulation and will start Eliquis 5 mg twice daily.  Stop aspirin.  CHA2DS2-VASc 4 (age 64, heart failure, CAD).  Discussed risks and benefits of long-term anticoagulation. 2. CHF: Although we will have not been able to obtain any records regarding his previous LV systolic function this is probably why he received CRT-P.  His current LVEF is low normal.  There appears to be little indication to add heart failure medications.  He does not require diuretics. 3. CAD: Asymptomatic reportedly received a stent at [facility] in 2007.  There is evidence of substantial coronary atherosclerosis and atherosclerotic calcification of the aorta on the CT scan that was performed for his cholelithiasis.  On aspirin and statin with target LDL less than 70. 4. Ao atherosclerosis: Aorta normal in caliber. 5. CRT-P: His device was reprogrammed today to avoid diaphragmatic stimulation.  To achieve this we had to turn adaptive LV capture "off".   Reevaluate in 3 months.  Normal device function, roughly 96% biventricular pacing.  Continue remote downloads every 3 months. 6. HLP: LDL at target on current medications.  Medication Adjustments/Labs and Tests Ordered: Current medicines are reviewed at length with the patient today.  Concerns regarding medicines are outlined above.  Orders Placed This Encounter  Procedures  . EKG  12-Lead   Meds ordered this encounter  Medications  . apixaban (ELIQUIS) 5 MG TABS tablet    Sig: Take 1 tablet (5 mg total) by mouth 2 (two) times daily.    Dispense:  60 tablet    Refill:  11    Signed, Sanda Klein, MD  03/23/2020 3:51 PM    [place]

## 2020-03-24 ENCOUNTER — Ambulatory Visit: Payer: Medicare Other | Attending: Internal Medicine

## 2020-03-24 DIAGNOSIS — Z23 Encounter for immunization: Secondary | ICD-10-CM

## 2020-03-24 NOTE — Progress Notes (Signed)
   Covid-19 Vaccination Clinic  Name:  Marcus Fuller    MRN: [id number] DOB: [birth year]  03/24/2020  [name] was observed post Covid-19 immunization for 15 minutes without incident. He was provided with Vaccine Information Sheet and instruction to access the V-Safe system.   [name] was instructed to call 911 with any severe reactions post vaccine: Marland Kitchen Difficulty breathing  . Swelling of face and throat  . A fast heartbeat  . A bad rash all over body  . Dizziness and weakness   Immunizations Administered    Name Date Dose VIS Date Route   Pfizer COVID-19 Vaccine 03/24/2020 10:57 AM 0.3 mL 11/29/2019 Intramuscular   Manufacturer: [place]   Lot: [id number]   [place]: [id number]

## 2020-03-31 NOTE — Progress Notes (Signed)
PATIENT: Marcus Fuller DOB: 1941-07-23  REASON FOR VISIT: follow up HISTORY FROM: patient  HISTORY OF PRESENT ILLNESS: Today 04/01/20  [name] is a 79 year old male with history of chronic low back pain and Parkinson's disease.  He has a very prominent tremor of the right upper extremity.  He remains on gabapentin, possibly helping tremor and chronic back pain.  He is on Sinemet 10-100 mg, 2 tablets twice daily.  His chronic back pain is more so the primary issue.  At this point, he says he has exhausted all options for his back pain, he resorts to taking tramadol as needed.  He feels the Parkinson's is overall stable.  His wife has noticed a decline, more shuffling gait.  No recent falls.  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  He is now taking Eliquis for A. fib.  He had Covid in December 2020.  He denies freezing episodes.  He has occasional burning in his feet.  For back pain, over the years he has done 2 sessions of physical therapy, was given a turtle shell but he could not wear it.  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  He presents today for evaluation accompanied by his wife.  HISTORY 07/03/2019 Dr. Jannifer Franklin: Mr. Marcus Fuller is a 79 year old right-handed white male with a history of chronic low back pain which is his primary medical issue.  This problem limits his ability to perform physical activity, if he is standing for more than 15 minutes he has significant pain and has to sit down.  His back pain tends to get much better with sitting or lying down.  He does have Parkinson's disease with a very prominent tremor involving the right upper extremity.  He has been placed on gabapentin for the tremor and for the back pain in very low-dose without much benefit.  He tolerates the medication otherwise fairly well.  He remains on  Sinemet taking the 10/100 mg tablets, taking 2 twice daily.  He has relatively good mobility, he is able to get up from a chair with ease, he has not had any significant balance issues or freezing episodes or falls.  He returns to this office for an evaluation.   REVIEW OF SYSTEMS: Out of a complete 14 system review of symptoms, the patient complains only of the following symptoms, and all other reviewed systems are negative.  Tremor, back pain  ALLERGIES: Allergies  Allergen Reactions  . Codeine Itching    HOME MEDICATIONS: Outpatient Medications Prior to Visit  Medication Sig Dispense Refill  . acetaminophen (TYLENOL) 500 MG tablet Take 1,000 mg by mouth every 6 (six) hours as needed for moderate pain.    Marland Kitchen apixaban (ELIQUIS) 5 MG TABS tablet Take 1 tablet (5 mg total) by mouth 2 (two) times daily. 60 tablet 11  . Ascorbic Acid (VITAMIN C) 100 MG tablet Take 100 mg by mouth daily.    Marland Kitchen atorvastatin (LIPITOR) 20 MG tablet TAKE 1 TABLET BY MOUTH EVERY DAY 90 tablet 3  . calcium-vitamin D (OSCAL WITH D) 500-200 MG-UNIT tablet Take 1 tablet by mouth.    . carbidopa-levodopa (SINEMET IR) 10-100 MG tablet TAKE 2 TABLETS BY MOUTH 2 (TWO) TIMES DAILY. (NEED TO BE SEEN BY NEW PROVIDER) 120 tablet 4  . gabapentin (NEURONTIN) 300  MG capsule TAKE 1 CAPSULE BY MOUTH THREE TIMES A DAY 90 capsule 3  . Melatonin 5 MG CAPS melatonin    . traMADol (ULTRAM) 50 MG tablet Take 1 tablet (50 mg total) by mouth every 6 (six) hours as needed. 15 tablet 0   No facility-administered medications prior to visit.    PAST MEDICAL HISTORY: Past Medical History:  Diagnosis Date  . Anxiety   . Cataract    beginning stages  . Chronic low back pain 07/03/2019  . Coronary artery disease    stent- 2007   . History of kidney stones   . Hypertension   . Parkinson's disease ([place]) 01/29/2018  . Presence of permanent cardiac pacemaker    2017   . Tremor of right hand     PAST SURGICAL HISTORY: Past Surgical  History:  Procedure Laterality Date  . APPENDECTOMY    . bbb    . CHOLECYSTECTOMY N/A 05/21/2018   Procedure: LAPAROSCOPIC CHOLECYSTECTOMY;  Surgeon: Coralie Keens, MD;  Location: [facility];  Service: General;  Laterality: N/A;  . left knee arthroscopy   1996  . PACEMAKER INSERTION  11/2016  . STENT PLACEMENT VASCULAR ([place] HX)  2007  . TONSILLECTOMY      FAMILY HISTORY: Family History  Problem Relation Age of Onset  . Heart disease Mother   . Diabetes Mother   . Cancer Mother        breast  . Heart disease Father   . Heart attack Father   . Atrial fibrillation Sister   . Heart disease Brother   . Pancreatic disease Brother   . Tremor Brother   . Heart attack Paternal Grandfather   . Spina bifida Brother     SOCIAL HISTORY: Social History   Socioeconomic History  . Marital status: Married    Spouse name: Not on file  . Number of children: 2  . Years of education: 41  . Highest education level: Not on file  Occupational History  . Not on file  Tobacco Use  . Smoking status: Never Smoker  . Smokeless tobacco: Never Used  Substance and Sexual Activity  . Alcohol use: Yes    Comment: social   . Drug use: No  . Sexual activity: Not Currently  Other Topics Concern  . Not on file  Social History Narrative   Lives w/ wife   Caffeine use: sometimes   Right handed    Social Determinants of Health   Financial Resource Strain:   . Difficulty of Paying Living Expenses:   Food Insecurity:   . Worried About Charity fundraiser in the Last Year:   . Arboriculturist in the Last Year:   Transportation Needs:   . Film/video editor (Medical):   Marland Kitchen Lack of Transportation (Non-Medical):   Physical Activity:   . Days of Exercise per Week:   . Minutes of Exercise per Session:   Stress:   . Feeling of Stress :   Social Connections:   . Frequency of Communication with Friends and Family:   . Frequency of Social Gatherings with Friends and Family:   . Attends  Religious Services:   . Active Member of Clubs or Organizations:   . Attends Archivist Meetings:   Marland Kitchen Marital Status:   Intimate Partner Violence:   . Fear of Current or Ex-Partner:   . Emotionally Abused:   Marland Kitchen Physically Abused:   . Sexually Abused:    PHYSICAL EXAM  Vitals:  04/01/20 1544  BP: 120/78  Pulse: 92  Temp: 98.7 F (37.1 C)  Weight: 204 lb (92.5 kg)  Height: 5\' 10"  (1.778 m)   Body mass index is 29.27 kg/m.  Generalized: Well developed, in no acute distress   Neurological examination  Mentation: Alert oriented to time, place, history taking. Follows all commands speech and language fluent Cranial nerve II-XII: Pupils were equal round reactive to light. Extraocular movements were full, visual field were full on confrontational test. Facial sensation and strength were normal.  Head turning and shoulder shrug were normal and symmetric. Motor: Good strength of all extremities.  A prominent resting tremor is noted in the right upper extremity. Sensory: Sensory testing is intact to soft touch on all 4 extremities. No evidence of extinction is noted.  Coordination: Cerebellar testing reveals good finger-nose-finger and heel-to-shin bilaterally.  Gait and station: Able to rise from seated position without pushoff, gait is slightly forward leaning, good stride and turns, decreased arm swing on the right, slight limp with the right leg Reflexes: Deep tendon reflexes are symmetric   DIAGNOSTIC DATA (LABS, IMAGING, TESTING) - I reviewed patient records, labs, notes, testing and imaging myself where available.  Lab Results  Component Value Date   WBC 9.5 12/28/2018   HGB 15.5 12/28/2018   HCT 46.3 12/28/2018   MCV 84 12/28/2018   PLT 165 12/28/2018      Component Value Date/Time   NA 141 12/28/2018 1245   K 4.2 12/28/2018 1245   CL 101 12/28/2018 1245   CO2 22 12/28/2018 1245   GLUCOSE 85 12/28/2018 1245   GLUCOSE 111 (H) 04/28/2018 0629   BUN 15  12/28/2018 1245   CREATININE 1.00 12/28/2018 1245   CALCIUM 10.5 (H) 12/28/2018 1245   PROT 6.6 12/28/2018 1245   ALBUMIN 4.4 12/28/2018 1245   AST 39 12/28/2018 1245   ALT 42 12/28/2018 1245   ALKPHOS 173 (H) 12/28/2018 1245   BILITOT 0.7 12/28/2018 1245   GFRNONAA 73 12/28/2018 1245   GFRAA 84 12/28/2018 1245   Lab Results  Component Value Date   CHOL 209 (H) 12/28/2018   HDL 37 (L) 12/28/2018   LDLCALC 125 (H) 12/28/2018   TRIG 233 (H) 12/28/2018   CHOLHDL 5.6 (H) 12/28/2018   No results found for: HGBA1C Lab Results  Component Value Date   VITAMINB12 571 12/05/2017   Lab Results  Component Value Date   TSH 5.310 (H) 06/20/2018      ASSESSMENT AND PLAN 79 y.o. year old male  has a past medical history of Anxiety, Cataract, Chronic low back pain (07/03/2019), Coronary artery disease, History of kidney stones, Hypertension, Parkinson's disease ([place]) (01/29/2018), Presence of permanent cardiac pacemaker, and Tremor of right hand. here with:  1.  Parkinson's disease, right-sided features 2.  Chronic low back pain  The patient is quite limited by his low back pain issues.  He will remain on gabapentin for the back pain, possibly benefiting the tremor as well.  I will increase his Sinemet 10-100 mg, 2 tablets 3 times daily.  He has appointment scheduled in June with Dr. Jannifer Franklin, this can be pushed out until August or September.   I spent 30 minutes of face-to-face and non-face-to-face time with patient.  This included previsit chart review, lab review, study review, order entry, electronic health record documentation, patient education.    Fearnow Denmark, AGNP-C, DNP 04/01/2020, 3:48 PM [facility] [address], [place], [place] [address] [phone]

## 2020-04-01 ENCOUNTER — Encounter: Payer: Self-pay | Admitting: Neurology

## 2020-04-01 ENCOUNTER — Ambulatory Visit: Payer: Medicare Other | Admitting: Neurology

## 2020-04-01 ENCOUNTER — Other Ambulatory Visit: Payer: Self-pay

## 2020-04-01 VITALS — BP 120/78 | HR 92 | Temp 98.7°F | Ht 70.0 in | Wt 204.0 lb

## 2020-04-01 DIAGNOSIS — G20 Parkinson's disease: Secondary | ICD-10-CM

## 2020-04-01 DIAGNOSIS — G8929 Other chronic pain: Secondary | ICD-10-CM

## 2020-04-01 DIAGNOSIS — M545 Low back pain, unspecified: Secondary | ICD-10-CM

## 2020-04-01 MED ORDER — GABAPENTIN 300 MG PO CAPS: ORAL_CAPSULE | ORAL | 5 refills | Status: DC

## 2020-04-01 MED ORDER — CARBIDOPA-LEVODOPA 10-100 MG PO TABS: 2.0000 | ORAL_TABLET | Freq: Three times a day (TID) | ORAL | 3 refills | Status: DC

## 2020-04-01 NOTE — Patient Instructions (Addendum)
Increase Sinemet 10-100 mg, 2 tablets 3 times daily  Continue gabapentin as prescribed  See Dr. Jannifer Franklin in August

## 2020-04-01 NOTE — Progress Notes (Signed)
I have read the note, and I agree with the clinical assessment and plan.  Kenlea Woodell K Anhthu Perdew   

## 2020-04-19 ENCOUNTER — Other Ambulatory Visit: Payer: Self-pay | Admitting: Neurology

## 2020-04-22 ENCOUNTER — Other Ambulatory Visit: Payer: Self-pay | Admitting: Cardiovascular Disease

## 2020-05-07 ENCOUNTER — Telehealth: Payer: Self-pay | Admitting: Cardiovascular Disease

## 2020-05-07 NOTE — Telephone Encounter (Signed)
New message    Patient states that he is having issues with his pacemaker. He has a pulsation with his pacemaker.

## 2020-05-07 NOTE — Telephone Encounter (Signed)
Called patient to assess. Patient reports of a pulsation around left ribs when he lays down on right side. Requested patient send a manual transmission. Patient states he is not at home right now but can send one around 2:30. Patient advised to call DC after he sends transmission so it can be reviewed. Verbalizes understanding.

## 2020-05-07 NOTE — Telephone Encounter (Signed)
Returning phone call to assist with device interrogation. Received transmission and reviewed. No data available on transmission noted to cause PNS. Informed patient he will need to come into clinic to have further testing to assess for PNS. Verbalizes understanding and agree. Apt scheduled for 05/13/20 @ 10:15 AM.   Patient reports of "pulsation" around left rib area only when laying on right side x6 months. States he came into office to see Dr. Sallyanne Kuster in March had adjustments, improved and over the past 2 weeks has since become more bothersome. Patient denies any other complaints.

## 2020-05-07 NOTE — Telephone Encounter (Signed)
   Follow up  Pt is calling back, he said he is returning call from [facility], he hasn't send a transmission and would like to send it while on the phone with DC.  Please call

## 2020-05-12 NOTE — Progress Notes (Signed)
Cardiology Office Note Date:  05/13/2020  Patient ID:  Marcus, Fuller Oct 26, 1941, MRN [id number] PCP:  System, Pcp Not In  Cardiologist:  Dr. Sallyanne Kuster    Chief Complaint: PNS  History of Present Illness: Marcus Fuller is a 79 y.o. male with history of Parkinson's disease w/tremor, CAD (remote stent 2007), AFib, CRT-P, LBBB, HTN  He last saw Dr. April 2021, discussed his up tick in AFib and started on Eliquis.  Not felt to be volume OL.  He did discuss years of what sounded like PNS/diaphragmatic stim when laying on his R side. "This was easily detected today and readily reproducible by turning over on the right.  It was present with LV pacing at 3.5 V at 0.4 ms (per device adaptive capture), but this appeared with pacing at 2.5 V at 0.4 ms.  Adaptive LV pacing was turned off and the LV output was reprogrammed to 1.75 V at 0.6 ms.  (LV 1-LV 2 configuration)."  Phone notes from last week report initially his "pulsating" had gone away after reprogramming by Dr. Sallyanne Kuster, though is back and more bothersome.  He comes today to address this.  TODAY He feels well, no changes since his visit with Dr. Sallyanne Kuster last month except the thumping/pulating came back a couple weeks ago again.  It is not painful, but bothersome and a bit anxiety provoking.  He states that 2/2 to his back he is unable to sleep on his L side and whe on his right and the PNS is happening takes a while to get comfortable and get it to stop.  No CP, palpitations or SOB, no near syncope or syncope.  No bleeding or signs of bleeding.  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    AFib hx Diagnosed +/-2016, none again until Sept 2020 via his device noted pt declined a/c at that time Dec 2020 had mild case of COVID 19 that provoked recurrent and long lasting AF episodes (longest 1.5 days, rate  controlled  AAD hx None to date  Device information MDT  CRT-P implanted 11/28/2016 (not here) No P waves today at 40  Past Medical History:  Diagnosis Date  . Anxiety   . Cataract    beginning stages  . Chronic low back pain 07/03/2019  . Coronary artery disease    stent- 2007   . History of kidney stones   . Hypertension   . Parkinson's disease ([place]) 01/29/2018  . Presence of permanent cardiac pacemaker    2017   . Tremor of right hand     Past Surgical History:  Procedure Laterality Date  . APPENDECTOMY    . bbb    . CHOLECYSTECTOMY N/A 05/21/2018   Procedure: LAPAROSCOPIC CHOLECYSTECTOMY;  Surgeon: Coralie Keens, MD;  Location: [facility];  Service: General;  Laterality: N/A;  . left knee arthroscopy   1996  . PACEMAKER INSERTION  11/2016  . STENT PLACEMENT VASCULAR ([place] HX)  2007  . TONSILLECTOMY      Current Outpatient Medications  Medication Sig Dispense Refill  . acetaminophen (TYLENOL) 500 MG tablet Take 1,000 mg by mouth every 6 (six) hours as needed for moderate pain.    Marland Kitchen apixaban (ELIQUIS) 5 MG TABS tablet Take 1 tablet (5 mg total) by mouth 2 (two) times daily. 60 tablet 11  . Ascorbic Acid (VITAMIN C) 100 MG tablet  Take 100 mg by mouth daily.    Marland Kitchen atorvastatin (LIPITOR) 20 MG tablet TAKE 1 TABLET BY MOUTH EVERY DAY 90 tablet 3  . calcium-vitamin D (OSCAL WITH D) 500-200 MG-UNIT tablet Take 1 tablet by mouth.    . carbidopa-levodopa (SINEMET IR) 10-100 MG tablet Take 2 tablets by mouth 3 (three) times daily. (Need to be seen by new provider) 550 tablet 3  . gabapentin (NEURONTIN) 300 MG capsule TAKE 1 CAPSULE BY MOUTH THREE TIMES A DAY 90 capsule 5  . Melatonin 5 MG CAPS melatonin    . traMADol (ULTRAM) 50 MG tablet Take 1 tablet (50 mg total) by mouth every 6 (six) hours as needed. 15 tablet 0   No current facility-administered medications for this visit.    Allergies:   Codeine   Social History:  The patient  reports that he has never smoked. He has  never used smokeless tobacco. He reports current alcohol use. He reports that he does not use drugs.   Family History:  The patient's family history includes Atrial fibrillation in his sister; Cancer in his mother; Diabetes in his mother; Heart attack in his father and paternal grandfather; Heart disease in his brother, father, and mother; Pancreatic disease in his brother; Spina bifida in his brother; Tremor in his brother.  ROS:  Please see the history of present illness.    All other systems are reviewed and otherwise negative.   PHYSICAL EXAM:  VS:  BP 136/74   Pulse 87   Ht 5\' 10"  (1.778 m)   Wt 202 lb (91.6 kg)   SpO2 94%   BMI 28.98 kg/m  BMI: Body mass index is 28.98 kg/m. Well nourished, well developed, in no acute distress  HEENT: normocephalic, atraumatic  Neck: no JVD, carotid bruits or masses Cardiac:  RRR; no significant murmurs, no rubs, or gallops Lungs:  CTA b/l, no wheezing, rhonchi or rales  Abd: soft, nontender MS: no deformity, age appropriate atrophy Ext: no edema  Skin: warm and dry, no rash Neuro:  No gross deficits appreciated Psych: euthymic mood, full affect  PPM site is stable, no tethering or discomfort   EKG:  Not done today  PPM interrogation done today and reviewed by myself:  Battery and lead measurements stable One AF episode 40 minutes PNS was reproducible with LV lead testing  With MDT rep assist: All LV vectors tested Best remains LV1 to LV2 from a threshold AND PNS perspective  LV lead thresholds (LV1-LV2) 2.0V/0.18ms 1.5V/0.92ms 1.0-1.25/1.57ms  >>> PNS stopped at 2.00V  LV lead left at LV1 to LV2, output changed to 1.75V/1.53ms    02/20/2018: TTE Study Conclusions  - Left ventricle: Abnormal septal motion. Wall thickness was  increased in a pattern of moderate LVH. Systolic function was  normal. The estimated ejection fraction was in the range of 50%  to 55%. Left ventricular diastolic function parameters were    normal.  - Aortic valve: Calcified non coronary cusp. There was mild  regurgitation.  - Left atrium: The atrium was mildly dilated.  - Atrial septum: No defect or patent foramen ovale was identified.     Recent Labs: No results found for requested labs within last 8760 hours.  No results found for requested labs within last 8760 hours.   CrCl cannot be calculated (Patient's most recent lab result is older than the maximum 21 days allowed.).   Wt Readings from Last 3 Encounters:  05/13/20 202 lb (91.6 kg)  04/01/20 204 lb (92.5  kg)  03/23/20 205 lb 6.4 oz (93.2 kg)     Other studies reviewed: Additional studies/records reviewed today include: summarized above  ASSESSMENT AND PLAN:  1. CRT-P     Diaphragmatic/phrenic nerve stim, not new for him     LV lead programmed as above     He is already scheduled to see Dr. Sallyanne Kuster in July, will keep that   2. Paroxysmal Afib     CHA2DS2Vasc is 4, on Eliquis, appropriately dosed     <0.1%  burden  3. CAD     No anginal symptoms     Deferred to Dr. Sallyanne Kuster  4. Presumed NICM     LVEF 50-55% last echo     99.6% VP, 95.1% effective      No symptoms or exam findings to suggest volume OL      Optivol is below threshold, but historically as waxed/waned some     No BB/ARB, diuretic, seems perhaps never was (prior to moving here)     C/w dr. Sallyanne Kuster  5. HTN     Looks OK, no changes made   Disposition: F/u with Dr. Loletha Grayer as scheduled, sooner if needed  Current medicines are reviewed at length with the patient today.  The patient did not have any concerns regarding medicines.  Venetia Night, PA-C 05/13/2020 1:43 PM     [address] [address] [phone] (office)  [phone] (fax)

## 2020-05-13 ENCOUNTER — Ambulatory Visit: Payer: Medicare Other | Admitting: Physician Assistant

## 2020-05-13 ENCOUNTER — Other Ambulatory Visit: Payer: Self-pay

## 2020-05-13 VITALS — BP 136/74 | HR 87 | Ht 70.0 in | Wt 202.0 lb

## 2020-05-13 DIAGNOSIS — I5032 Chronic diastolic (congestive) heart failure: Secondary | ICD-10-CM | POA: Diagnosis not present

## 2020-05-13 DIAGNOSIS — I480 Paroxysmal atrial fibrillation: Secondary | ICD-10-CM | POA: Diagnosis not present

## 2020-05-13 DIAGNOSIS — Z950 Presence of cardiac pacemaker: Secondary | ICD-10-CM

## 2020-05-13 DIAGNOSIS — I428 Other cardiomyopathies: Secondary | ICD-10-CM

## 2020-05-13 DIAGNOSIS — I2510 Atherosclerotic heart disease of native coronary artery without angina pectoris: Secondary | ICD-10-CM | POA: Diagnosis not present

## 2020-05-13 DIAGNOSIS — I10 Essential (primary) hypertension: Secondary | ICD-10-CM

## 2020-05-13 NOTE — Patient Instructions (Signed)
Medication Instructions:  Your physician recommends that you continue on your current medications as directed. Please refer to the Current Medication list given to you today.  *If you need a refill on your cardiac medications before your next appointment, please call your pharmacy*   Lab Work: [place]   If you have labs (blood work) drawn today and your tests are completely normal, you will receive your results only by: Marland Kitchen MyChart Message (if you have MyChart) OR . A paper copy in the mail If you have any lab test that is abnormal or we need to change your treatment, we will call you to review the results.   Testing/Procedures: NONE ORDERED  TODAY    Follow-Up: At [facility], you and your health needs are our priority.  As part of our continuing mission to provide you with exceptional heart care, we have created designated Provider Care Teams.  These Care Teams include your primary Cardiologist (physician) and Advanced Practice Providers (APPs -  Physician Assistants and Nurse Practitioners) who all work together to provide you with the care you need, when you need it.  We recommend signing up for the patient portal called "MyChart".  Sign up information is provided on this After Visit Summary.  MyChart is used to connect with patients for Virtual Visits (Telemedicine).  Patients are able to view lab/test results, encounter notes, upcoming appointments, etc.  Non-urgent messages can be sent to your provider as well.   To learn more about what you can do with MyChart, go to [email or web address].    Your next appointment:   2 month(s)  The format for your next appointment:   In Person  Provider:   You may see Sanda Klein, MD or one of the following Advanced Practice Providers on your designated Care Team:    Other Instructions

## 2020-05-23 NOTE — Progress Notes (Signed)
Thank you :)

## 2020-06-05 ENCOUNTER — Ambulatory Visit (INDEPENDENT_AMBULATORY_CARE_PROVIDER_SITE_OTHER): Payer: Medicare Other | Admitting: *Deleted

## 2020-06-05 DIAGNOSIS — I5032 Chronic diastolic (congestive) heart failure: Secondary | ICD-10-CM

## 2020-06-05 DIAGNOSIS — I480 Paroxysmal atrial fibrillation: Secondary | ICD-10-CM

## 2020-06-05 LAB — CUP PACEART REMOTE DEVICE CHECK
Battery Remaining Longevity: 67 mo
Battery Voltage: 2.96 V
Brady Statistic AP VP Percent: 97.06 %
Brady Statistic AP VS Percent: 0.05 %
Brady Statistic AS VP Percent: 1.51 %
Brady Statistic AS VS Percent: 1.38 %
Brady Statistic RA Percent Paced: 97.55 %
Brady Statistic RV Percent Paced: 98.56 %
Date Time Interrogation Session: 20210618020659
Implantable Lead Implant Date: 20171211
Implantable Lead Implant Date: 20171211
Implantable Lead Implant Date: 20171211
Implantable Lead Location: 753858
Implantable Lead Location: 753859
Implantable Lead Location: 753860
Implantable Lead Model: 4298
Implantable Lead Model: 5076
Implantable Lead Model: 5076
Implantable Pulse Generator Implant Date: 20171211
Lead Channel Impedance Value: 304 Ohm
Lead Channel Impedance Value: 323 Ohm
Lead Channel Impedance Value: 361 Ohm
Lead Channel Impedance Value: 399 Ohm
Lead Channel Impedance Value: 456 Ohm
Lead Channel Impedance Value: 475 Ohm
Lead Channel Impedance Value: 475 Ohm
Lead Channel Impedance Value: 513 Ohm
Lead Channel Impedance Value: 608 Ohm
Lead Channel Impedance Value: 760 Ohm
Lead Channel Impedance Value: 798 Ohm
Lead Channel Impedance Value: 798 Ohm
Lead Channel Impedance Value: 855 Ohm
Lead Channel Impedance Value: 855 Ohm
Lead Channel Pacing Threshold Amplitude: 0.5 V
Lead Channel Pacing Threshold Amplitude: 1.125 V
Lead Channel Pacing Threshold Amplitude: 2.25 V
Lead Channel Pacing Threshold Pulse Width: 0.4 ms
Lead Channel Pacing Threshold Pulse Width: 0.4 ms
Lead Channel Pacing Threshold Pulse Width: 0.4 ms
Lead Channel Sensing Intrinsic Amplitude: 2.375 mV
Lead Channel Sensing Intrinsic Amplitude: 2.375 mV
Lead Channel Sensing Intrinsic Amplitude: 21.5 mV
Lead Channel Sensing Intrinsic Amplitude: 21.5 mV
Lead Channel Setting Pacing Amplitude: 1.5 V
Lead Channel Setting Pacing Amplitude: 1.75 V
Lead Channel Setting Pacing Amplitude: 2.5 V
Lead Channel Setting Pacing Pulse Width: 0.4 ms
Lead Channel Setting Pacing Pulse Width: 1 ms
Lead Channel Setting Sensing Sensitivity: 0.9 mV

## 2020-06-05 NOTE — Progress Notes (Signed)
Remote pacemaker transmission.   

## 2020-06-11 ENCOUNTER — Other Ambulatory Visit: Payer: Self-pay | Admitting: Cardiovascular Disease

## 2020-06-11 ENCOUNTER — Ambulatory Visit: Payer: Self-pay | Admitting: Neurology

## 2020-06-29 ENCOUNTER — Telehealth: Payer: Self-pay | Admitting: Emergency Medicine

## 2020-06-29 NOTE — Telephone Encounter (Signed)
Can we please have him download as soon as he gets home and make him an office appt first available, please.

## 2020-06-29 NOTE — Telephone Encounter (Signed)
Alert received for AT/AF alert for ongoing AF episode that started 06/28/20 at 23 57. BiVP % decreased to 84.9% with AF event.Patient reports he is taking his Eliquis and has felt that he was more tired and weak the past 2 days. Patient is currently on the way to [place] for the week and does not have his remote monitor to send transmissions. Patient reports he has had no CP, SOB, chest pressure, dizziness, or syncope. Ed precautions given for worsening cardiac symptoms.

## 2020-06-29 NOTE — Telephone Encounter (Signed)
The patient will do a download Saturday when he returns from the beach. He has an appointment on 07/06/20

## 2020-07-02 ENCOUNTER — Encounter: Payer: Medicare Other | Admitting: Cardiovascular Disease

## 2020-07-06 ENCOUNTER — Other Ambulatory Visit: Payer: Self-pay

## 2020-07-06 ENCOUNTER — Encounter: Payer: Self-pay | Admitting: Cardiovascular Disease

## 2020-07-06 ENCOUNTER — Ambulatory Visit (INDEPENDENT_AMBULATORY_CARE_PROVIDER_SITE_OTHER): Payer: Medicare Other | Admitting: Cardiovascular Disease

## 2020-07-06 VITALS — BP 107/71 | HR 89 | Ht 70.0 in | Wt 204.2 lb

## 2020-07-06 DIAGNOSIS — I700 Atherosclerosis of aorta: Secondary | ICD-10-CM | POA: Diagnosis not present

## 2020-07-06 DIAGNOSIS — I2510 Atherosclerotic heart disease of native coronary artery without angina pectoris: Secondary | ICD-10-CM

## 2020-07-06 DIAGNOSIS — G20 Parkinson's disease: Secondary | ICD-10-CM

## 2020-07-06 DIAGNOSIS — I480 Paroxysmal atrial fibrillation: Secondary | ICD-10-CM | POA: Diagnosis not present

## 2020-07-06 DIAGNOSIS — Z950 Presence of cardiac pacemaker: Secondary | ICD-10-CM | POA: Diagnosis not present

## 2020-07-06 DIAGNOSIS — I10 Essential (primary) hypertension: Secondary | ICD-10-CM

## 2020-07-06 LAB — BASIC METABOLIC PANEL
BUN/Creatinine Ratio: 16 (ref 10–24)
BUN: 16 mg/dL (ref 8–27)
CO2: 23 mmol/L (ref 20–29)
Calcium: 9.7 mg/dL (ref 8.6–10.2)
Chloride: 103 mmol/L (ref 96–106)
Creatinine, Ser: 0.99 mg/dL (ref 0.76–1.27)
GFR calc Af Amer: 85 mL/min/{1.73_m2} (ref >59)
GFR calc non Af Amer: 73 mL/min/{1.73_m2} (ref >59)
Glucose: 87 mg/dL (ref 65–99)
Potassium: 4.5 mmol/L (ref 3.5–5.2)
Sodium: 141 mmol/L (ref 134–144)

## 2020-07-06 LAB — MAGNESIUM: Magnesium: 1.9 mg/dL (ref 1.6–2.3)

## 2020-07-06 NOTE — Progress Notes (Signed)
Cardiology Office Note:    Date:  07/06/2020   ID:  [name], DOB 03-28-1941, MRN [id number]  PCP:  System, Pcp Not In  Cardiologist:  Shemeika Starzyk  Referring MD: No ref. provider found   Chief Complaint  Patient presents with  . Atrial Fibrillation  CHF, CRT-P   History of Present Illness:    Marcus Fuller is a 79 y.o. male with a remote history of coronary artery disease and stent placement (2007), paroxysmal atrial fibrillation, dual-chamber biventricular pacemaker implanted in November 2017 ([employer]tronic Percepta CRT-P), low normal left ventricular systolic function (EF 16-01% echo March 2019), LBBB.  He generally feels well and has limitations are related primarily to Parkinson's disease, rather than cardiovascular illness.  He recalls having an episode of weakness on Friday ninth and had to go home to lie in bed.  He did not have palpitations or dyspnea.  This coincides with an onset of an episode of atrial fibrillation that lasted for about 6 days, terminating spontaneously on July 14.  During that period of time he was able to go to the beach and enjoy vacation with his family, again unaware of palpitations or any limitations with his breathing.  During the episode of atrial fibrillation, despite the fact that he did not have RVR, there was reduction in biventricular pacing efficiency to about 85% and there was a mild and transient reduction in thoracic impedance/increase in OptiVol which has subsequently returned to normal.  [name] had a single episode of atrial fibrillation roughly 5 years ago that resolved spontaneously during ER evaluation.  His pacemaker then showed no evidence of atrial fibrillation for the ensuing 4-5 years.  He had a 2-hour episode of paroxysmal atrial fibrillation in September 2020 that was asymptomatic.  Anticoagulation was offered but declined at that time, with a plan to reviewed the indication if he would have new events.  In December 2020 he had a  relatively mild COVID-19 illness but developed atrial fibrillation.  He subsequently developed several back-to-back episodes of paroxysmal atrial fibrillation, lasting a cumulative 6 or 7 days, with the longest individual episode over 1-1/2 days duration.  The rate was always well controlled.  He has been compliant with anticoagulation and has not had any problems with falls, injuries or serious bleeding complications.  She has not had any focal neurological events to suggest stroke or TIA, but continues to have limited mobility and tremor related to Parkinson's disease.  The patient specifically denies any chest pain at rest or with exertion, dyspnea at rest or with exertion, orthopnea, paroxysmal nocturnal dyspnea, syncope,  focal neurological deficits, intermittent claudication, lower extremity edema, unexplained weight gain, cough, hemoptysis or wheezing.  He had problems with intermittent diaphragmatic stimulation for years, but these have improved dramatically after his most recent reprogramming in the device clinic.  He now only feels an occasional jolt if he lies on his right side and it promptly resolves when he turns over.  Pacemaker interrogation is otherwise normal and biventricular pacing efficiency has increased back up to 96%.  He also has 85% atrial pacing.  Heart rate histogram distribution is appropriate considering his sedentary lifestyle.  Presenting tracing today was atrial paced, biventricular paced with occasional PVCs.  There have been no episodes of high ventricular rates.  ECG with the ventricular pacing turned off shows left bundle branch block with a duration of about 150 ms.   Past Medical History:  Diagnosis Date  . Anxiety   . Cataract  beginning stages  . Chronic low back pain 07/03/2019  . Coronary artery disease    stent- 2007   . History of kidney stones   . Hypertension   . Parkinson's disease ([place]) 01/29/2018  . Presence of permanent cardiac pacemaker     2017   . Tremor of right hand     Past Surgical History:  Procedure Laterality Date  . APPENDECTOMY    . bbb    . CHOLECYSTECTOMY N/A 05/21/2018   Procedure: LAPAROSCOPIC CHOLECYSTECTOMY;  Surgeon: Coralie Keens, MD;  Location: [facility];  Service: General;  Laterality: N/A;  . left knee arthroscopy   1996  . PACEMAKER INSERTION  11/2016  . STENT PLACEMENT VASCULAR ([place] HX)  2007  . TONSILLECTOMY      Current Medications: Current Meds  Medication Sig  . acetaminophen (TYLENOL) 500 MG tablet Take 1,000 mg by mouth every 6 (six) hours as needed for moderate pain.  Marland Kitchen apixaban (ELIQUIS) 5 MG TABS tablet Take 1 tablet (5 mg total) by mouth 2 (two) times daily.  . Ascorbic Acid (VITAMIN C) 100 MG tablet Take 100 mg by mouth daily.  Marland Kitchen atorvastatin (LIPITOR) 20 MG tablet TAKE 1 TABLET BY MOUTH EVERY DAY  . calcium-vitamin D (OSCAL WITH D) 500-200 MG-UNIT tablet Take 1 tablet by mouth.  . carbidopa-levodopa (SINEMET IR) 10-100 MG tablet Take 2 tablets by mouth 3 (three) times daily. (Need to be seen by new provider)  . gabapentin (NEURONTIN) 300 MG capsule TAKE 1 CAPSULE BY MOUTH THREE TIMES A DAY  . Melatonin 5 MG CAPS melatonin  . traMADol (ULTRAM) 50 MG tablet Take 1 tablet (50 mg total) by mouth every 6 (six) hours as needed.     Allergies:   Codeine   Social History   Socioeconomic History  . Marital status: Married    Spouse name: Not on file  . Number of children: 2  . Years of education: 81  . Highest education level: Not on file  Occupational History  . Not on file  Tobacco Use  . Smoking status: Never Smoker  . Smokeless tobacco: Never Used  Vaping Use  . Vaping Use: Never used  Substance and Sexual Activity  . Alcohol use: Yes    Comment: social   . Drug use: No  . Sexual activity: Not Currently  Other Topics Concern  . Not on file  Social History Narrative   Lives w/ wife   Caffeine use: sometimes   Right handed    Social Determinants of Health    Financial Resource Strain:   . Difficulty of Paying Living Expenses:   Food Insecurity:   . Worried About Charity fundraiser in the Last Year:   . Arboriculturist in the Last Year:   Transportation Needs:   . Film/video editor (Medical):   Marland Kitchen Lack of Transportation (Non-Medical):   Physical Activity:   . Days of Exercise per Week:   . Minutes of Exercise per Session:   Stress:   . Feeling of Stress :   Social Connections:   . Frequency of Communication with Friends and Family:   . Frequency of Social Gatherings with Friends and Family:   . Attends Religious Services:   . Active Member of Clubs or Organizations:   . Attends Archivist Meetings:   Marland Kitchen Marital Status:      Family History: The patient's family history includes Atrial fibrillation in his sister; Cancer in his mother; Diabetes  in his mother; Heart attack in his father and paternal grandfather; Heart disease in his brother, father, and mother; Pancreatic disease in his brother; Spina bifida in his brother; Tremor in his brother.  Reports that his father died of a heart attack at age 41.  ROS:   Please see the history of present illness.    All other systems are reviewed and are negative.  EKGs/Labs/Other Studies Reviewed:    The following studies were reviewed today: Comprehensive pacemaker check in the office today  EKG:  EKG is ordered today.  It shows atrial paced, biventricular paced rhythm with occasional PVCs.  The QRS remains quite broad at 170 ms with a positive R waves in lead V1.  QTc 545 ms  Recent Labs: 07/06/2020: BUN 16; Creatinine, Ser 0.99; Magnesium 1.9; Potassium 4.5; Sodium 141  Recent Lipid Panel    Component Value Date/Time   CHOL 209 (H) 12/28/2018 1245   TRIG 233 (H) 12/28/2018 1245   HDL 37 (L) 12/28/2018 1245   CHOLHDL 5.6 (H) 12/28/2018 1245   LDLCALC 125 (H) 12/28/2018 1245   02/13/2020 Total cholesterol 98, HDL 31, triglycerides 84, LDL 59  Physical Exam:    VS:   BP 107/71   Pulse 89   Ht 5\' 10"  (1.778 m)   Wt 204 lb 3.2 oz (92.6 kg)   SpO2 95%   BMI 29.30 kg/m     Wt Readings from Last 3 Encounters:  07/06/20 204 lb 3.2 oz (92.6 kg)  05/13/20 202 lb (91.6 kg)  04/01/20 204 lb (92.5 kg)     General: Alert, oriented x3, no distress, overweight.  Well-healed left subclavian pacemaker site. Head: no evidence of trauma, PERRL, EOMI, no exophtalmos or lid lag, no myxedema, no xanthelasma; normal ears, nose and oropharynx Neck: normal jugular venous pulsations and no hepatojugular reflux; brisk carotid pulses without delay and no carotid bruits Chest: clear to auscultation, no signs of consolidation by percussion or palpation, normal fremitus, symmetrical and full respiratory excursions Cardiovascular: normal position and quality of the apical impulse, regular rhythm, normal first and second heart sounds, no murmurs, rubs or gallops Abdomen: no tenderness or distention, no masses by palpation, no abnormal pulsatility or arterial bruits, normal bowel sounds, no hepatosplenomegaly Extremities: no clubbing, cyanosis or edema; 2+ radial, ulnar and brachial pulses bilaterally; 2+ right femoral, posterior tibial and dorsalis pedis pulses; 2+ left femoral, posterior tibial and dorsalis pedis pulses; no subclavian or femoral bruits Neurological: grossly nonfocal, with mild resting tremor in both hands Psych: Normal mood and affect   ASSESSMENT:    1. Paroxysmal atrial fibrillation (HCC)   2. Coronary artery disease involving native coronary artery of native heart without angina pectoris   3. Aortic atherosclerosis ([place])   4. Biventricular cardiac pacemaker in situ   5. Essential hypertension   6. Parkinson's disease ([place])    PLAN:    In order of problems listed above:  1. AFib: Recent 6-day episode of atrial fibrillation with controlled ventricular rate, resolved spontaneously.  We will check labs to make sure the exacerbation was not due to an  electrolyte abnormality.  Although there was a reduction in effective biventricular pacing and a trend to worse thoracic impedance, he has recovered spontaneously without overt heart failure exacerbation.  He is compliant with anticoagulation.  CHA2DS2-VASc 4 (age 59, heart failure, CAD).  Discussed risks and benefits of long-term anticoagulation.ds regarding his previous LV systolic function this is probably why he received CRT-P.  His current LVEF  is low normal.  There appears to be little indication to add heart failure medications.  He does not require diuretics. 2. CAD: Asymptomatic reportedly received a stent [facility] in 2007.  There is evidence of substantial coronary atherosclerosis and atherosclerotic calcification of the aorta on the CT scan that was performed for his cholelithiasis.  On aspirin and statin with LDL less than 70. 3. Ao atherosclerosis: Aorta normal in caliber. 4. CRT-P: His device was reprogrammed today to avoid diaphragmatic stimulation.  To achieve this we had to turn adaptive LV capture "off".  Diaphragmatic stimulation is dramatically reduced and only occurs infrequently.  With the exception of the episode of 6 days of atrial fibrillation earlier this month, he has had excellent biventricular pacing efficacy.  Continue remote downloads every 3 months. 5. HLP: LDL at target on current medications. 6. Parkinson's disease: This is his major functional limitation.   Medication Adjustments/Labs and Tests Ordered: Current medicines are reviewed at length with the patient today.  Concerns regarding medicines are outlined above.  Orders Placed This Encounter  Procedures  . Basic metabolic panel  . Magnesium   No orders of the defined types were placed in this encounter.   Signed, Sanda Klein, MD  07/06/2020 6:06 PM    [place]

## 2020-07-06 NOTE — Patient Instructions (Signed)
Medication Instructions:  No changes *If you need a refill on your cardiac medications before your next appointment, please call your pharmacy*   Lab Work: Your provider would like for you to have the following labs today: BMET and Magnesium   If you have labs (blood work) drawn today and your tests are completely normal, you will receive your results only by: Marland Kitchen MyChart Message (if you have MyChart) OR . A paper copy in the mail If you have any lab test that is abnormal or we need to change your treatment, we will call you to review the results.   Testing/Procedures: None ordered   Follow-Up: At [facility], you and your health needs are our priority.  As part of our continuing mission to provide you with exceptional heart care, we have created designated Provider Care Teams.  These Care Teams include your primary Cardiologist (physician) and Advanced Practice Providers (APPs -  Physician Assistants and Nurse Practitioners) who all work together to provide you with the care you need, when you need it.  We recommend signing up for the patient portal called "MyChart".  Sign up information is provided on this After Visit Summary.  MyChart is used to connect with patients for Virtual Visits (Telemedicine).  Patients are able to view lab/test results, encounter notes, upcoming appointments, etc.  Non-urgent messages can be sent to your provider as well.   To learn more about what you can do with MyChart, go to [email or web address].    Your next appointment:   6 month(s)  The format for your next appointment:   In Person  Provider:   Sanda Klein, MD

## 2020-07-09 ENCOUNTER — Encounter: Payer: Self-pay | Admitting: *Deleted

## 2020-07-10 NOTE — Addendum Note (Signed)
Addended by: Wonda Horner on: 07/10/2020 04:26 PM   Modules accepted: Orders

## 2020-07-23 ENCOUNTER — Telehealth: Payer: Self-pay | Admitting: *Deleted

## 2020-07-23 NOTE — Telephone Encounter (Signed)
PPM alert received for AT/AF burden on 07/22/20. AF episode ongoing at time of transmission. Pt states he didn't feel well yesterday, felt foggy. Denies chest discomfort, SOB, palpitations, or other cardiac symptoms. States he started a new prescription for hydrocodone (prescribed by Dr. Nelva Bush), and he did not tolerate it well. Reports he feels much better today and will not take any more hydrocodone. Assisted with manual transmission, presenting rhythm AP/BiVP. AF episode on 8/4 was ~8 hrs in duration, V rates controlled. Pt reports compliance with Eliquis. Heart failure diagnostics stable.  Advised will continue to monitor for future sustained AF events. Pt in agreement with plan, denies additional questions at this time.

## 2020-08-31 ENCOUNTER — Ambulatory Visit: Payer: Medicare Other | Admitting: Neurology

## 2020-08-31 ENCOUNTER — Other Ambulatory Visit: Payer: Self-pay

## 2020-08-31 ENCOUNTER — Encounter: Payer: Self-pay | Admitting: Neurology

## 2020-08-31 VITALS — BP 113/67 | HR 51 | Ht 70.0 in | Wt 203.0 lb

## 2020-08-31 DIAGNOSIS — G20 Parkinson's disease: Secondary | ICD-10-CM | POA: Diagnosis not present

## 2020-08-31 MED ORDER — GABAPENTIN 300 MG PO CAPS: ORAL_CAPSULE | ORAL | 1 refills | Status: DC

## 2020-08-31 NOTE — Progress Notes (Signed)
Reason for visit: Parkinson's disease, chronic low back pain  Marcus Fuller is an 79 y.o. male  History of present illness:  Marcus Fuller is a 79 year old right-handed white male with a history of Parkinson's disease.  The patient is limited with his ability to perform physical activity as he gets severe right-sided back pain when he stands up for more than 15 minutes.  The patient has been seen by Dr. Nelva Bush in the past without much benefit.  He does not have sciatica type pain.  He is on Sinemet taking the 10/100 mg tablets, 2 tablets 3 times daily, he does have prominent resting tremors involving the right arm.  He has not had any falls, he denies issues with swallowing or choking.  He is now on Eliquis for atrial fibrillation.  He is on gabapentin taking 300 mg 3 times daily for the back pain without significant benefit, he takes Ultram if needed as well.  He returns for an evaluation.  Past Medical History:  Diagnosis Date  . Anxiety   . Cataract    beginning stages  . Chronic low back pain 07/03/2019  . Coronary artery disease    stent- 2007   . History of kidney stones   . Hypertension   . Parkinson's disease ([place]) 01/29/2018  . Presence of permanent cardiac pacemaker    2017   . Tremor of right hand     Past Surgical History:  Procedure Laterality Date  . APPENDECTOMY    . bbb    . CHOLECYSTECTOMY N/A 05/21/2018   Procedure: LAPAROSCOPIC CHOLECYSTECTOMY;  Surgeon: Coralie Keens, MD;  Location: [facility];  Service: General;  Laterality: N/A;  . left knee arthroscopy   1996  . PACEMAKER INSERTION  11/2016  . STENT PLACEMENT VASCULAR ([place] HX)  2007  . TONSILLECTOMY      Family History  Problem Relation Age of Onset  . Heart disease Mother   . Diabetes Mother   . Cancer Mother        breast  . Heart disease Father   . Heart attack Father   . Atrial fibrillation Sister   . Heart disease Brother   . Pancreatic disease Brother   . Tremor Brother   . Heart attack  Paternal Grandfather   . Spina bifida Brother     Social history:  reports that he has never smoked. He has never used smokeless tobacco. He reports current alcohol use. He reports that he does not use drugs.    Allergies  Allergen Reactions  . Codeine Itching    Medications:  Prior to Admission medications   Medication Sig Start Date End Date Taking? Authorizing Provider  acetaminophen (TYLENOL) 500 MG tablet Take 1,000 mg by mouth every 6 (six) hours as needed for moderate pain.   Yes [provider]  apixaban (ELIQUIS) 5 MG TABS tablet Take 1 tablet (5 mg total) by mouth 2 (two) times daily. 03/23/20  Yes Croitoru, Mihai, MD  Ascorbic Acid (VITAMIN C) 100 MG tablet Take 100 mg by mouth daily.   Yes [provider]  atorvastatin (LIPITOR) 20 MG tablet TAKE 1 TABLET BY MOUTH EVERY DAY 06/11/20  Yes Baldwin Jamaica, PA-C  calcium-vitamin D (OSCAL WITH D) 500-200 MG-UNIT tablet Take 1 tablet by mouth.   Yes [provider]  carbidopa-levodopa (SINEMET IR) 10-100 MG tablet Take 2 tablets by mouth 3 (three) times daily. (Need to be seen by new provider) 04/01/20  Yes Hilley Denmark  J, NP  gabapentin (NEURONTIN) 300 MG capsule TAKE 1 CAPSULE BY MOUTH THREE TIMES A DAY 04/01/20  Yes Suzzanne Cloud, NP  Melatonin 5 MG CAPS melatonin   Yes [provider]  traMADol (ULTRAM) 50 MG tablet Take 1 tablet (50 mg total) by mouth every 6 (six) hours as needed. 04/28/18  Yes Petrucelli, Samantha R, PA-C    ROS:  Out of a complete 14 system review of symptoms, the patient complains only of the following symptoms, and all other reviewed systems are negative.  Back pain Tremor Daytime drowsiness  Blood pressure 113/67, pulse (!) 51, height 5\' 10"  (1.778 m), weight 203 lb (92.1 kg).  Physical Exam  General: The patient is alert and cooperative at the time of the examination.  Skin: No significant peripheral edema is noted.   Neurologic Exam  Mental status: The  patient is alert and oriented x 3 at the time of the examination. The patient has apparent normal recent and remote memory, with an apparently normal attention span and concentration ability.   Cranial nerves: Facial symmetry is present. Speech is normal, no aphasia or dysarthria is noted. Extraocular movements are full. Visual fields are full.  Masking of the face is seen.  Motor: The patient has good strength in all 4 extremities.  Sensory examination: Soft touch sensation is symmetric on the face, arms, and legs.  Coordination: The patient has good finger-nose-finger and heel-to-shin bilaterally.  Resting tremors noted with the right upper extremity.  Gait and station: The patient is able to arise from a seated position with arms crossed.  Once up, he can walk independently, a prominent right arm tremors noted.  He has a stooped posture.  Tandem gait is not attempted.  Romberg is negative.  Reflexes: Deep tendon reflexes are symmetric.   Assessment/Plan:  1.  Parkinson's disease  2.  Chronic low back pain  The patient is limited in how long he can stand up because of back pain.  We will go up on the gabapentin, but the patient watch out for daytime drowsiness on the medication.  We will try to get him to a dose of 600 mg in the morning and evening and 300 mg at midday.  We will try to remain as active as possible.  We may go up on the Sinemet on the next visit to the 25/250 mg tablets, 1 tablet 3 times daily.  Jill Alexanders MD 08/31/2020 12:25 PM  [facility] [address] [place], [place] [address]  Phone [phone] Fax [phone]

## 2020-08-31 NOTE — Patient Instructions (Signed)
We will go up on the gabapentin 300 mg to one in the morning and midday and 2 at night for one week, then take 2 in the morning and evening and one at midday.

## 2020-09-04 ENCOUNTER — Ambulatory Visit (INDEPENDENT_AMBULATORY_CARE_PROVIDER_SITE_OTHER): Payer: Medicare Other | Admitting: *Deleted

## 2020-09-04 DIAGNOSIS — I5032 Chronic diastolic (congestive) heart failure: Secondary | ICD-10-CM

## 2020-09-06 LAB — CUP PACEART REMOTE DEVICE CHECK
Battery Remaining Longevity: 58 mo
Battery Voltage: 2.95 V
Brady Statistic AP VP Percent: 85.57 %
Brady Statistic AP VS Percent: 0.21 %
Brady Statistic AS VP Percent: 5.1 %
Brady Statistic AS VS Percent: 9.12 %
Brady Statistic RA Percent Paced: 86.34 %
Brady Statistic RV Percent Paced: 90.24 %
Date Time Interrogation Session: 20210917134113
Implantable Lead Implant Date: 20171211
Implantable Lead Implant Date: 20171211
Implantable Lead Implant Date: 20171211
Implantable Lead Location: 753858
Implantable Lead Location: 753859
Implantable Lead Location: 753860
Implantable Lead Model: 4298
Implantable Lead Model: 5076
Implantable Lead Model: 5076
Implantable Pulse Generator Implant Date: 20171211
Lead Channel Impedance Value: 304 Ohm
Lead Channel Impedance Value: 323 Ohm
Lead Channel Impedance Value: 361 Ohm
Lead Channel Impedance Value: 399 Ohm
Lead Channel Impedance Value: 399 Ohm
Lead Channel Impedance Value: 399 Ohm
Lead Channel Impedance Value: 437 Ohm
Lead Channel Impedance Value: 456 Ohm
Lead Channel Impedance Value: 494 Ohm
Lead Channel Impedance Value: 722 Ohm
Lead Channel Impedance Value: 741 Ohm
Lead Channel Impedance Value: 779 Ohm
Lead Channel Impedance Value: 779 Ohm
Lead Channel Impedance Value: 855 Ohm
Lead Channel Pacing Threshold Amplitude: 0.5 V
Lead Channel Pacing Threshold Amplitude: 1.25 V
Lead Channel Pacing Threshold Amplitude: 2.25 V
Lead Channel Pacing Threshold Pulse Width: 0.4 ms
Lead Channel Pacing Threshold Pulse Width: 0.4 ms
Lead Channel Pacing Threshold Pulse Width: 0.4 ms
Lead Channel Sensing Intrinsic Amplitude: 1.875 mV
Lead Channel Sensing Intrinsic Amplitude: 1.875 mV
Lead Channel Sensing Intrinsic Amplitude: 17.625 mV
Lead Channel Sensing Intrinsic Amplitude: 17.625 mV
Lead Channel Setting Pacing Amplitude: 1.5 V
Lead Channel Setting Pacing Amplitude: 1.75 V
Lead Channel Setting Pacing Amplitude: 2.5 V
Lead Channel Setting Pacing Pulse Width: 0.4 ms
Lead Channel Setting Pacing Pulse Width: 1 ms
Lead Channel Setting Sensing Sensitivity: 0.9 mV

## 2020-09-07 NOTE — Progress Notes (Signed)
Remote pacemaker transmission.   

## 2020-10-14 ENCOUNTER — Telehealth: Payer: Self-pay

## 2020-10-14 NOTE — Telephone Encounter (Signed)
The pt states he been adjusted a couple of time. He can feel like its pulsing.

## 2020-10-14 NOTE — Telephone Encounter (Signed)
Patient reports s/sx of possible PNS. Scheduled for evaluation in [place] on 10/15/20 @ 0920.

## 2020-10-15 ENCOUNTER — Ambulatory Visit (INDEPENDENT_AMBULATORY_CARE_PROVIDER_SITE_OTHER): Payer: Medicare Other | Admitting: Emergency Medicine

## 2020-10-15 ENCOUNTER — Other Ambulatory Visit: Payer: Self-pay

## 2020-10-15 DIAGNOSIS — T82897D Other specified complication of cardiac prosthetic devices, implants and grafts, subsequent encounter: Secondary | ICD-10-CM

## 2020-10-15 DIAGNOSIS — Z950 Presence of cardiac pacemaker: Secondary | ICD-10-CM

## 2020-10-15 DIAGNOSIS — I5022 Chronic systolic (congestive) heart failure: Secondary | ICD-10-CM

## 2020-10-15 LAB — CUP PACEART INCLINIC DEVICE CHECK
Battery Remaining Longevity: 46 mo
Battery Voltage: 2.95 V
Brady Statistic AP VP Percent: 87.62 %
Brady Statistic AP VS Percent: 0.16 %
Brady Statistic AS VP Percent: 5.17 %
Brady Statistic AS VS Percent: 7.05 %
Brady Statistic RA Percent Paced: 88.97 %
Brady Statistic RV Percent Paced: 92.49 %
Date Time Interrogation Session: 20211028094000
Implantable Lead Implant Date: 20171211
Implantable Lead Implant Date: 20171211
Implantable Lead Implant Date: 20171211
Implantable Lead Location: 753858
Implantable Lead Location: 753859
Implantable Lead Location: 753860
Implantable Lead Model: 4298
Implantable Lead Model: 5076
Implantable Lead Model: 5076
Implantable Pulse Generator Implant Date: 20171211
Lead Channel Impedance Value: 304 Ohm
Lead Channel Impedance Value: 342 Ohm
Lead Channel Impedance Value: 361 Ohm
Lead Channel Impedance Value: 418 Ohm
Lead Channel Impedance Value: 513 Ohm
Lead Channel Impedance Value: 532 Ohm
Lead Channel Impedance Value: 532 Ohm
Lead Channel Impedance Value: 570 Ohm
Lead Channel Impedance Value: 684 Ohm
Lead Channel Impedance Value: 874 Ohm
Lead Channel Impedance Value: 912 Ohm
Lead Channel Impedance Value: 912 Ohm
Lead Channel Impedance Value: 931 Ohm
Lead Channel Impedance Value: 950 Ohm
Lead Channel Pacing Threshold Amplitude: 0.5 V
Lead Channel Pacing Threshold Amplitude: 1.25 V
Lead Channel Pacing Threshold Amplitude: 3 V
Lead Channel Pacing Threshold Pulse Width: 0.4 ms
Lead Channel Pacing Threshold Pulse Width: 0.4 ms
Lead Channel Pacing Threshold Pulse Width: 1 ms
Lead Channel Sensing Intrinsic Amplitude: 15 mV
Lead Channel Sensing Intrinsic Amplitude: 2.125 mV
Lead Channel Setting Pacing Amplitude: 1.5 V
Lead Channel Setting Pacing Amplitude: 2.5 V
Lead Channel Setting Pacing Amplitude: 3.5 V
Lead Channel Setting Pacing Pulse Width: 0.4 ms
Lead Channel Setting Pacing Pulse Width: 1 ms
Lead Channel Setting Sensing Sensitivity: 0.9 mV

## 2020-10-15 NOTE — Progress Notes (Signed)
CRT-P device check in clinic. Normal device function. Thresholds, sensing, impedance consistent with previous measurements. Histograms appropriate for patient and level of activity. AT/AF burden 2.5%, 133 AMS episodes available EGMs show AF with controlled v-rates, + Eliquis. 3 ventricular high rate episodes with EGMs that show NSVT. Patient bi-ventricularly pacing 92.44% of the time. Vectors tested due to PNS with industry present, capture at LV 2- can at 4.5 V @ 1 ms, and capture at LV2-LV1 at 3 V @ 1 ms . Device programmed LV2- LV1. Device programmed with appropriate safety margins. Device heart failure diagnostics are within normal limits and stable over time. Estimated longevity 3 years 10 months. Patient enrolled in remote follow-up and next remote 12/04/20.

## 2020-10-15 NOTE — Patient Instructions (Addendum)
ollow-up with your cardiologist concerning your intermittent chest pain with activity.

## 2020-12-01 ENCOUNTER — Other Ambulatory Visit: Payer: Self-pay | Admitting: Neurology

## 2020-12-02 ENCOUNTER — Other Ambulatory Visit: Payer: Self-pay | Admitting: Neurology

## 2020-12-04 ENCOUNTER — Ambulatory Visit (INDEPENDENT_AMBULATORY_CARE_PROVIDER_SITE_OTHER): Payer: Medicare Other

## 2020-12-04 DIAGNOSIS — I5022 Chronic systolic (congestive) heart failure: Secondary | ICD-10-CM

## 2020-12-04 LAB — CUP PACEART REMOTE DEVICE CHECK
Battery Remaining Longevity: 31 mo
Battery Voltage: 2.93 V
Brady Statistic AP VP Percent: 89.94 %
Brady Statistic AP VS Percent: 0.07 %
Brady Statistic AS VP Percent: 4.58 %
Brady Statistic AS VS Percent: 5.41 %
Brady Statistic RA Percent Paced: 81.89 %
Brady Statistic RV Percent Paced: 93.78 %
Date Time Interrogation Session: 20211217011022
Implantable Lead Implant Date: 20171211
Implantable Lead Implant Date: 20171211
Implantable Lead Implant Date: 20171211
Implantable Lead Location: 753858
Implantable Lead Location: 753859
Implantable Lead Location: 753860
Implantable Lead Model: 4298
Implantable Lead Model: 5076
Implantable Lead Model: 5076
Implantable Pulse Generator Implant Date: 20171211
Lead Channel Impedance Value: 285 Ohm
Lead Channel Impedance Value: 342 Ohm
Lead Channel Impedance Value: 342 Ohm
Lead Channel Impedance Value: 399 Ohm
Lead Channel Impedance Value: 418 Ohm
Lead Channel Impedance Value: 418 Ohm
Lead Channel Impedance Value: 494 Ohm
Lead Channel Impedance Value: 513 Ohm
Lead Channel Impedance Value: 570 Ohm
Lead Channel Impedance Value: 627 Ohm
Lead Channel Impedance Value: 760 Ohm
Lead Channel Impedance Value: 779 Ohm
Lead Channel Impedance Value: 779 Ohm
Lead Channel Impedance Value: 855 Ohm
Lead Channel Pacing Threshold Amplitude: 0.5 V
Lead Channel Pacing Threshold Amplitude: 1.25 V
Lead Channel Pacing Threshold Amplitude: 2.25 V
Lead Channel Pacing Threshold Pulse Width: 0.4 ms
Lead Channel Pacing Threshold Pulse Width: 0.4 ms
Lead Channel Pacing Threshold Pulse Width: 0.4 ms
Lead Channel Sensing Intrinsic Amplitude: 1.75 mV
Lead Channel Sensing Intrinsic Amplitude: 1.75 mV
Lead Channel Sensing Intrinsic Amplitude: 15.5 mV
Lead Channel Sensing Intrinsic Amplitude: 15.5 mV
Lead Channel Setting Pacing Amplitude: 1.5 V
Lead Channel Setting Pacing Amplitude: 2.5 V
Lead Channel Setting Pacing Amplitude: 3.5 V
Lead Channel Setting Pacing Pulse Width: 0.4 ms
Lead Channel Setting Pacing Pulse Width: 1 ms
Lead Channel Setting Sensing Sensitivity: 0.9 mV

## 2020-12-07 ENCOUNTER — Telehealth: Payer: Self-pay

## 2020-12-07 NOTE — Telephone Encounter (Signed)
Carelink alert received for increasing Optivol (HF risk). BVP 82.8% (<90% for last 7 days), AT/AF burden 100%, controlled VR.   Patient reports of generalized weakness for a sometime. 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 Denies any leg/foot swelling, trouble sleeping at night or shortness of breath. Complaint with all medications including Eliquis 5 mg BID. Advised patient I will forward to Dr. Recardo Evangelist and we will call with changes. Voiced understanding. Advised to call with questions or worsening symptoms.

## 2020-12-08 MED ORDER — DIGOXIN 125 MCG PO TABS: 0.1250 mg | ORAL_TABLET | Freq: Every day | ORAL | 3 refills | Status: DC

## 2020-12-08 NOTE — Telephone Encounter (Signed)
The patient has been made aware. Digoxin has been sent in for him. He will do another download next Wednesday and an appointment has been made on 12/30/20 with Dr. Sallyanne Kuster.

## 2020-12-08 NOTE — Telephone Encounter (Signed)
Pt called In and would like to talk about the below results with someone.  He is concerned about the results   Best number is [phone]

## 2020-12-08 NOTE — Telephone Encounter (Signed)
Thank you! Last time he did this he recovered spontaneously. Please add digoxin 0.125 mg daily, #30, RF 3. Avoiding beta blockers and calcium channel blockers due to relatively low BP and risk of orthostatic hypotension with Parkinson's. Please do another download in one week to see if still in AF and reevaluate Optivol. Please make an appt with me next month.

## 2020-12-16 ENCOUNTER — Ambulatory Visit (INDEPENDENT_AMBULATORY_CARE_PROVIDER_SITE_OTHER): Payer: Medicare Other

## 2020-12-16 DIAGNOSIS — I5022 Chronic systolic (congestive) heart failure: Secondary | ICD-10-CM

## 2020-12-16 LAB — CUP PACEART REMOTE DEVICE CHECK
Battery Remaining Longevity: 33 mo
Battery Voltage: 2.93 V
Brady Statistic AP VP Percent: 94.28 %
Brady Statistic AP VS Percent: 0.03 %
Brady Statistic AS VP Percent: 4.53 %
Brady Statistic AS VS Percent: 1.17 %
Brady Statistic RA Percent Paced: 94.29 %
Brady Statistic RV Percent Paced: 98.81 %
Date Time Interrogation Session: 20211229010845
Implantable Lead Implant Date: 20171211
Implantable Lead Implant Date: 20171211
Implantable Lead Implant Date: 20171211
Implantable Lead Location: 753858
Implantable Lead Location: 753859
Implantable Lead Location: 753860
Implantable Lead Model: 4298
Implantable Lead Model: 5076
Implantable Lead Model: 5076
Implantable Pulse Generator Implant Date: 20171211
Lead Channel Impedance Value: 285 Ohm
Lead Channel Impedance Value: 342 Ohm
Lead Channel Impedance Value: 342 Ohm
Lead Channel Impedance Value: 418 Ohm
Lead Channel Impedance Value: 437 Ohm
Lead Channel Impedance Value: 475 Ohm
Lead Channel Impedance Value: 494 Ohm
Lead Channel Impedance Value: 551 Ohm
Lead Channel Impedance Value: 608 Ohm
Lead Channel Impedance Value: 722 Ohm
Lead Channel Impedance Value: 798 Ohm
Lead Channel Impedance Value: 836 Ohm
Lead Channel Impedance Value: 893 Ohm
Lead Channel Impedance Value: 893 Ohm
Lead Channel Pacing Threshold Amplitude: 0.5 V
Lead Channel Pacing Threshold Amplitude: 1.25 V
Lead Channel Pacing Threshold Amplitude: 2.25 V
Lead Channel Pacing Threshold Pulse Width: 0.4 ms
Lead Channel Pacing Threshold Pulse Width: 0.4 ms
Lead Channel Pacing Threshold Pulse Width: 0.4 ms
Lead Channel Sensing Intrinsic Amplitude: 1.875 mV
Lead Channel Sensing Intrinsic Amplitude: 1.875 mV
Lead Channel Sensing Intrinsic Amplitude: 14.5 mV
Lead Channel Sensing Intrinsic Amplitude: 14.5 mV
Lead Channel Setting Pacing Amplitude: 1.5 V
Lead Channel Setting Pacing Amplitude: 2.5 V
Lead Channel Setting Pacing Amplitude: 3.5 V
Lead Channel Setting Pacing Pulse Width: 0.4 ms
Lead Channel Setting Pacing Pulse Width: 1 ms
Lead Channel Setting Sensing Sensitivity: 0.9 mV

## 2020-12-16 NOTE — Telephone Encounter (Signed)
Transmission received today: Normal device function.  Presenting Rhythm ASVP, no AF since last manual transmission on 12/27.  Optivol level improving.

## 2020-12-16 NOTE — Telephone Encounter (Signed)
Good news. He is back in normal rhythm. Also, fluid has proved. He should start feeling better soon. Let's just do the digoxin for the next month (since AFib may come back and his heart rate is too fast in AFib). If ok for a month, can stop the digoxin. Please call back if he does not feel back to baseline in next few days.

## 2020-12-16 NOTE — Telephone Encounter (Signed)
The patient has been made aware. He has a follow up on 12/30/20 with Dr. Sallyanne Kuster.

## 2020-12-17 ENCOUNTER — Other Ambulatory Visit: Payer: Self-pay | Admitting: Physician Assistant

## 2020-12-17 NOTE — Progress Notes (Signed)
Remote pacemaker transmission.   

## 2020-12-30 ENCOUNTER — Encounter: Payer: Self-pay | Admitting: Cardiovascular Disease

## 2020-12-30 ENCOUNTER — Other Ambulatory Visit: Payer: Self-pay

## 2020-12-30 ENCOUNTER — Ambulatory Visit (INDEPENDENT_AMBULATORY_CARE_PROVIDER_SITE_OTHER): Payer: Medicare Other | Admitting: Cardiovascular Disease

## 2020-12-30 VITALS — BP 106/80 | HR 90 | Ht 70.0 in | Wt 204.0 lb

## 2020-12-30 DIAGNOSIS — I5022 Chronic systolic (congestive) heart failure: Secondary | ICD-10-CM

## 2020-12-30 DIAGNOSIS — I2510 Atherosclerotic heart disease of native coronary artery without angina pectoris: Secondary | ICD-10-CM

## 2020-12-30 DIAGNOSIS — G20 Parkinson's disease: Secondary | ICD-10-CM

## 2020-12-30 DIAGNOSIS — E785 Hyperlipidemia, unspecified: Secondary | ICD-10-CM

## 2020-12-30 DIAGNOSIS — I700 Atherosclerosis of aorta: Secondary | ICD-10-CM

## 2020-12-30 DIAGNOSIS — I5042 Chronic combined systolic (congestive) and diastolic (congestive) heart failure: Secondary | ICD-10-CM | POA: Diagnosis not present

## 2020-12-30 DIAGNOSIS — I480 Paroxysmal atrial fibrillation: Secondary | ICD-10-CM | POA: Diagnosis not present

## 2020-12-30 DIAGNOSIS — Z950 Presence of cardiac pacemaker: Secondary | ICD-10-CM

## 2020-12-30 NOTE — Progress Notes (Signed)
Remote pacemaker transmission.   

## 2020-12-30 NOTE — Patient Instructions (Signed)

## 2021-01-04 NOTE — Progress Notes (Signed)
Cardiology Office Note:    Date:  01/04/2021   ID:  Marcus Fuller, DOB [birth year], MRN [id number]  PCP:  Kristen Loader, FNP  Cardiologist:  Xiong Haidar  Referring MD: Kristen Loader, FNP   Chief Complaint  Patient presents with  . Follow-up  . Chest Pain    Around pacemaker.  CHF, CRT-P   History of Present Illness:    Marcus Fuller is a 80 y.o. male with a remote history of coronary artery disease and stent placement (2007), paroxysmal atrial fibrillation, dual-chamber biventricular pacemaker implanted in November 2017 ([employer]tronic Percepta CRT-P), low normal left ventricular systolic function (EF 67-67% echo March 2019), LBBB.  For most of last year, he had no cardiovascular problems and as always his symptoms are dominated by the effects of Parkinson's disease.  Towards the end of December he had a marked increase in the burden of atrial fibrillation with substantial reduction in the incidence of biventricular pacing to less than 90%.  Off and on he was mostly in atrial fibrillation for about 2 weeks.  During that time there was evidence of fluid accumulation with his OptiVol exceeding threshold.  By the time he came in for this appointment, both the atrial fibrillation and the OptiVol deviation have resolved.  He feels well.  He denies dyspnea at rest or with activity, orthopnea, PND and does not have lower extremity edema.  He has not had recent falls or syncope.  His blood pressure tends to run fairly low.  Device interrogation shows that effective biventricular pacing has been poor, only about 48%.  This appears to be due to a slight increase in the LV pacing threshold which is now 3.25 V at 1.0 ms (output programmed 3.5 V at 1.0 ms).  The output was reprogrammed to 4.0 V at 1.0 ms today.  We monitored for diaphragmatic stimulation with breathing maneuvers and sitting/standing/supine positions.  There was no diaphragmatic stimulation in the clinic.  Otherwise he has had 94.5%  ventricular pacing, 79% atrial pacing.  Recent burden of atrial fibrillation was up to 16%, but in normal rhythm now for the last week or so.  When in atrial fibrillation the ventricular rate was well controlled.  He has had very rare nonsustained VT (essentially 2 episodes of 7 beats each, both asymptomatic).  Estimated generator longevity is about 2 years.  His ECG shows sinus rhythm with biventricular pacing and a distinct positive R wave in lead V1.  There is 1 premature atrial contraction that seems to be conducted with left bundle branch block pattern.  QTc is 450 ms.  He has been compliant with anticoagulation and has not had any bleeding, falls or injuries while on Eliquis.  Digoxin was started last month because of the poor effective pacing percentage doing atrial fibrillation.  He has not had any symptoms of digoxin toxicity to date.  Renal function is normal with a recent creatinine of 0.99.  Marcus Fuller had a single episode of atrial fibrillation roughly 5 years ago that resolved spontaneously during ER evaluation.  His pacemaker then showed no evidence of atrial fibrillation for the ensuing 4-5 years.  He had a 2-hour episode of paroxysmal atrial fibrillation in September 2020 that was asymptomatic.  Anticoagulation was offered but declined at that time, with a plan to reviewed the indication if he would have new events.  In December 2020 he had a relatively mild COVID-19 illness but developed atrial fibrillation.  He subsequently developed several back-to-back episodes of paroxysmal atrial  fibrillation, lasting a cumulative 6 or 7 days, with the longest individual episode over 1-1/2 days duration.  The rate was always well controlled.  ECG with the ventricular pacing turned off shows left bundle branch block with a duration of about 150 ms.   Past Medical History:  Diagnosis Date  . Anxiety   . Cataract    beginning stages  . Chronic low back pain 07/03/2019  . Coronary artery disease     stent- 2007   . History of kidney stones   . Hypertension   . Parkinson's disease ([place]) 01/29/2018  . Presence of permanent cardiac pacemaker    2017   . Tremor of right hand     Past Surgical History:  Procedure Laterality Date  . APPENDECTOMY    . bbb    . CHOLECYSTECTOMY N/A 05/21/2018   Procedure: LAPAROSCOPIC CHOLECYSTECTOMY;  Surgeon: Coralie Keens, MD;  Location: [facility];  Service: General;  Laterality: N/A;  . left knee arthroscopy   1996  . PACEMAKER INSERTION  11/2016  . STENT PLACEMENT VASCULAR ([place] HX)  2007  . TONSILLECTOMY      Current Medications: Current Meds  Medication Sig  . acetaminophen (TYLENOL) 500 MG tablet Take 1,000 mg by mouth every 6 (six) hours as needed for moderate pain.  Marland Kitchen apixaban (ELIQUIS) 5 MG TABS tablet Take 1 tablet (5 mg total) by mouth 2 (two) times daily.  . Ascorbic Acid (VITAMIN C) 100 MG tablet Take 100 mg by mouth daily.  Marland Kitchen atorvastatin (LIPITOR) 20 MG tablet TAKE 1 TABLET BY MOUTH EVERY DAY  . carbidopa-levodopa (SINEMET IR) 10-100 MG tablet Take 2 tablets by mouth 3 (three) times daily. (Need to be seen by new provider)  . digoxin (LANOXIN) 0.125 MG tablet Take 1 tablet (0.125 mg total) by mouth daily.  Marland Kitchen gabapentin (NEURONTIN) 300 MG capsule Take 2 caps in am and pm and 1 cap in midday.  . Melatonin 5 MG CAPS melatonin  . traMADol (ULTRAM) 50 MG tablet Take 1 tablet (50 mg total) by mouth every 6 (six) hours as needed.     Allergies:   Codeine   Social History   Socioeconomic History  . Marital status: Married    Spouse name: Not on file  . Number of children: 2  . Years of education: 32  . Highest education level: Not on file  Occupational History  . Not on file  Tobacco Use  . Smoking status: Never Smoker  . Smokeless tobacco: Never Used  Vaping Use  . Vaping Use: Never used  Substance and Sexual Activity  . Alcohol use: Yes    Comment: social   . Drug use: No  . Sexual activity: Not Currently  Other Topics  Concern  . Not on file  Social History Narrative   Lives w/ wife   Caffeine use: sometimes   Right handed    Social Determinants of Health   Financial Resource Strain: Not on file  Food Insecurity: Not on file  Transportation Needs: Not on file  Physical Activity: Not on file  Stress: Not on file  Social Connections: Not on file     Family History: The patient's family history includes Atrial fibrillation in his sister; Cancer in his mother; Diabetes in his mother; Heart attack in his father and paternal grandfather; Heart disease in his brother, father, and mother; Pancreatic disease in his brother; Spina bifida in his brother; Tremor in his brother.  Reports that his father died of  a heart attack at age 63.  ROS:   Please see the history of present illness.    All other systems are reviewed and are negative.  EKGs/Labs/Other Studies Reviewed:    The following studies were reviewed today: In person comprehensive pacemaker check performed today, with device reprogramming  EKG:  EKG is ordered today.  It shows sinus rhythm (atrial sensed), biventricular paced rhythm with distinct positive R wave in lead V1.  There is a single premature atrial contraction conducted with left bundle branch block morphology.  QTc 450 ms.  Recent Labs: 07/06/2020: BUN 16; Creatinine, Ser 0.99; Magnesium 1.9; Potassium 4.5; Sodium 141  Recent Lipid Panel    Component Value Date/Time   CHOL 209 (H) 12/28/2018 1245   TRIG 233 (H) 12/28/2018 1245   HDL 37 (L) 12/28/2018 1245   CHOLHDL 5.6 (H) 12/28/2018 1245   LDLCALC 125 (H) 12/28/2018 1245   02/13/2020 Total cholesterol 98, HDL 31, triglycerides 84, LDL 50  Physical Exam:    VS:  BP 106/80 (BP Location: Left Arm, Patient Position: Sitting, Cuff Size: Normal)   Pulse 90   Ht 5\' 10"  (1.778 m)   Wt 204 lb (92.5 kg)   BMI 29.27 kg/m     Wt Readings from Last 3 Encounters:  12/30/20 204 lb (92.5 kg)  08/31/20 203 lb (92.1 kg)  07/06/20 204  lb 3.2 oz (92.6 kg)      General: Alert, oriented x3, no distress, overweight Head: no evidence of trauma, PERRL, EOMI, no exophtalmos or lid lag, no myxedema, no xanthelasma; normal ears, nose and oropharynx Neck: normal jugular venous pulsations and no hepatojugular reflux; brisk carotid pulses without delay and no carotid bruits Chest: clear to auscultation, no signs of consolidation by percussion or palpation, normal fremitus, symmetrical and full respiratory excursions Cardiovascular: normal position and quality of the apical impulse, regular rhythm, normal first and second heart sounds, no murmurs, rubs or gallops Abdomen: no tenderness or distention, no masses by palpation, no abnormal pulsatility or arterial bruits, normal bowel sounds, no hepatosplenomegaly Extremities: no clubbing, cyanosis or edema; 2+ radial, ulnar and brachial pulses bilaterally; 2+ right femoral, posterior tibial and dorsalis pedis pulses; 2+ left femoral, posterior tibial and dorsalis pedis pulses; no subclavian or femoral bruits Neurological: grossly nonfocal except mild resting tremor of both hands Psych: Normal mood and affect  ASSESSMENT:    1. Paroxysmal atrial fibrillation (HCC)   2. Chronic combined systolic and diastolic heart failure ([place])   3. Coronary artery disease involving native coronary artery of native heart without angina pectoris   4. Aortic atherosclerosis ([place])   5. Biventricular cardiac pacemaker in situ   6. Dyslipidemia (high LDL; low HDL)   7. Parkinson's disease (HCC)    PLAN:    In order of problems listed above:  1. AFib: Recent increase in the burden of atrial fibrillation to approximately 16%, currently resolved.  He was unaware of palpitations and did not have symptoms of heart failure, but his device did show transient worsening of his thoracic impedance (OptiVol).  This is also back to normal.   He is compliant with anticoagulation.  CHA2DS2-VASc 4 (age 80, heart failure,  CAD).  Low threshold to discontinue his digoxin in the future. 2. CHF: Not evident clinically.  He does not require diuretics.  Left ventricular systolic function after implementation of CRT is borderline low.  Digoxin is his only "heart failure medication" and is likely not necessary in the long run. 3. CAD: No  angina.  Asymptomatic.  He reportedly received a stent at [facility] in 2007.  There is evidence of substantial coronary atherosclerosis and atherosclerotic calcification of the aorta on the CT scan that was performed for his cholelithiasis.  On aspirin and statin with LDL less than 70. 4. Ao atherosclerosis: Aorta normal in caliber.  No evidence of aneurysm or dissection. 5. CRT-P: He had less than 50% effective pacing, primarily due to the borderline pacing threshold, just underneath the programmed output.  Atrial fibrillation probably worsened the biventricular pacing efficiency as well.  We have had to turn adaptive LV capture off because this caused diaphragmatic stimulation.  LV lead output increased to 4 V at 1 ms, without any evidence of diaphragmatic stimulation subjectively or by objective monitoring in the clinic today, regardless of position or respiratory maneuvers.  Asked him to call us if he feels any unusual twitching in his chest or abdomen. 6. HLP: Excellent LDL on current medications, HDL remains low. 7. Parkinson's disease: This is his major functional limitation and has a much larger impact on his quality of life than his cardiovascular issues.   Medication Adjustments/Labs and Tests Ordered: Current medicines are reviewed at length with the patient today.  Concerns regarding medicines are outlined above.  Orders Placed This Encounter  Procedures  . EKG 12-Lead   No orders of the defined types were placed in this encounter.   Signed, Sanda Klein, MD  01/04/2021 3:12 PM    [place]

## 2021-01-07 ENCOUNTER — Telehealth: Payer: Self-pay | Admitting: Emergency Medicine

## 2021-01-07 NOTE — Telephone Encounter (Addendum)
Patient currently in AF with controlled v-rates . Hx of AF, + eliquis, digoxin 0.125 mg qd . No omitted doses of medication. Patient asymptomatic. He reports he has had diarrhea x 5 days and is taking probiotics and imodium per PCP recommendations. Will send remote transmission on 01/11/21 to evaluate rhythm.

## 2021-01-12 NOTE — Telephone Encounter (Signed)
Patient is still in AF w/ controlled VR's. Viewed trends and all ventricular rates appear controlled. Patient reports he feels back to normal and has no complaints. Advised patient I will forward to Dr. Sallyanne Kuster and we will call with changes. Request patient call us back if he becomes symptomatic. Verbalized understanding.

## 2021-03-04 ENCOUNTER — Ambulatory Visit (INDEPENDENT_AMBULATORY_CARE_PROVIDER_SITE_OTHER): Payer: Medicare Other | Admitting: Neurology

## 2021-03-04 ENCOUNTER — Encounter: Payer: Self-pay | Admitting: Neurology

## 2021-03-04 VITALS — BP 135/91 | HR 90 | Ht 70.0 in | Wt 205.0 lb

## 2021-03-04 DIAGNOSIS — G20 Parkinson's disease: Secondary | ICD-10-CM

## 2021-03-04 MED ORDER — CARBIDOPA-LEVODOPA 25-250 MG PO TABS: 1.0000 | ORAL_TABLET | Freq: Three times a day (TID) | ORAL | 3 refills | Status: DC

## 2021-03-04 MED ORDER — GABAPENTIN 600 MG PO TABS: 600.0000 mg | ORAL_TABLET | Freq: Three times a day (TID) | ORAL | 3 refills | Status: DC

## 2021-03-04 NOTE — Progress Notes (Signed)
Reason for visit: Parkinson's disease, chronic low back pain  Marcus Fuller is an 80 y.o. male  History of present illness:  Marcus Fuller is a 80 year old right-handed white male with a history of Parkinson's disease primarily with right-sided features.  He has prominent tremors of the right arm, but is now developing similar tremors on the left.  He is restricted in his ability to perform physical activities secondary to low back pain that is severe when he is standing.  He can only stand for 10 to 15 minutes.  He reports no problems with chewing or swallowing, he denies any severe changes in balance, he has not had any falls.  He is being followed as well for atrial fibrillation.  He is followed by Dr. Nelva Bush for his back but they have not been able to help him much.  He takes gabapentin without a whole lot of benefit.  He remains on Sinemet 10/100 mg tablets, he takes 2 tablets in the morning and 3 tablets in the evening.  He returns for an evaluation.  Past Medical History:  Diagnosis Date  . Anxiety   . Cataract    beginning stages  . Chronic low back pain 07/03/2019  . Coronary artery disease    stent- 2007   . History of kidney stones   . Hypertension   . Parkinson's disease ([place]) 01/29/2018  . Presence of permanent cardiac pacemaker    2017   . Tremor of right hand     Past Surgical History:  Procedure Laterality Date  . APPENDECTOMY    . bbb    . CHOLECYSTECTOMY N/A 05/21/2018   Procedure: LAPAROSCOPIC CHOLECYSTECTOMY;  Surgeon: Coralie Keens, MD;  Location: [facility];  Service: General;  Laterality: N/A;  . left knee arthroscopy   1996  . PACEMAKER INSERTION  11/2016  . STENT PLACEMENT VASCULAR ([place] HX)  2007  . TONSILLECTOMY      Family History  Problem Relation Age of Onset  . Heart disease Mother   . Diabetes Mother   . Cancer Mother        breast  . Heart disease Father   . Heart attack Father   . Atrial fibrillation Sister   . Heart disease Brother   .  Pancreatic disease Brother   . Tremor Brother   . Heart attack Paternal Grandfather   . Spina bifida Brother     Social history:  reports that he has never smoked. He has never used smokeless tobacco. He reports current alcohol use. He reports that he does not use drugs.    Allergies  Allergen Reactions  . Codeine Itching    Medications:  Prior to Admission medications   Medication Sig Start Date End Date Taking? Authorizing Provider  acetaminophen (TYLENOL) 500 MG tablet Take 1,000 mg by mouth every 6 (six) hours as needed for moderate pain.   Yes [provider]  apixaban (ELIQUIS) 5 MG TABS tablet Take 1 tablet (5 mg total) by mouth 2 (two) times daily. 03/23/20  Yes Croitoru, Mihai, MD  Ascorbic Acid (VITAMIN C) 100 MG tablet Take 100 mg by mouth daily.   Yes [provider]  atorvastatin (LIPITOR) 20 MG tablet TAKE 1 TABLET BY MOUTH EVERY DAY 12/17/20  Yes Baldwin Jamaica, PA-C  carbidopa-levodopa (SINEMET IR) 10-100 MG tablet Take 2 tablets by mouth 3 (three) times daily. (Need to be seen by new provider) 04/01/20  Yes Suzzanne Cloud, NP  digoxin (LANOXIN) 0.125  MG tablet Take 1 tablet (0.125 mg total) by mouth daily. 12/08/20  Yes Croitoru, Mihai, MD  gabapentin (NEURONTIN) 300 MG capsule Take 2 caps in am and pm and 1 cap in midday. 12/02/20  Yes Kathrynn Ducking, MD  Melatonin 5 MG CAPS melatonin   Yes [provider]  traMADol (ULTRAM) 50 MG tablet Take 1 tablet (50 mg total) by mouth every 6 (six) hours as needed. 04/28/18  Yes Petrucelli, Samantha R, PA-C    ROS:  Out of a complete 14 system review of symptoms, the patient complains only of the following symptoms, and all other reviewed systems are negative.  Tremor Back pain Walking difficulty  Blood pressure (!) 135/91, pulse 90, height 5\' 10"  (1.778 m), weight 205 lb (93 kg).  Physical Exam  General: The patient is alert and cooperative at the time of the examination.  Skin: No  significant peripheral edema is noted.   Neurologic Exam  Mental status: The patient is alert and oriented x 3 at the time of the examination. The patient has apparent normal recent and remote memory, with an apparently normal attention span and concentration ability.   Cranial nerves: Facial symmetry is present. Speech is normal, no aphasia or dysarthria is noted. Extraocular movements are full. Visual fields are full.  Motor: The patient has good strength in all 4 extremities.  Sensory examination: Soft touch sensation is symmetric on the face, arms, and legs.  Coordination: The patient has good finger-nose-finger and heel-to-shin bilaterally.  The patient has resting tremors on the right greater than left upper extremities.  Gait and station: The patient is able to arise from a seated position with the arms crossed with some difficulty.  The patient is able to walk independently, has prominent tremors in the right arm with walking.  The patient has some unsteadiness with tandem gait.  Romberg is negative.  Reflexes: Deep tendon reflexes are symmetric.   Assessment/Plan:  1.  Parkinson's disease  2.  Chronic low back pain  The patient is not very active mainly because of his low back.  He will be switched to Sinemet 25/250 mg tablets, he will take 1 tablet 3 times daily.  The gabapentin will be increased to 600 mg, 1 tablet 3 times daily.  He will follow-up here in 5 months.  Jill Alexanders MD 03/04/2021 12:25 PM  [facility] [address] [place], [place] [address]  Phone [phone] Fax [phone]

## 2021-03-04 NOTE — Patient Instructions (Signed)
We will increase the sinemet (carbidopa) to the 25/250 mg tablets, one tablet three times a day.  Increase the gabapentin to 600 mg three times a day.  Sinemet (carbidopa) may result in confusion or hallucinations, drowsiness, nausea, or dizziness. If any significant side effects are noted, please contact our office. Sinemet may not be well absorbed when taken with high protein meals, if tolerated it is best to take 30-45 minutes before you eat.  Neurontin (gabapentin) may result in drowsiness, ankle swelling, gait instability, or possibly dizziness. Please contact our office if significant side effects occur with this medication.

## 2021-03-17 ENCOUNTER — Telehealth: Payer: Self-pay

## 2021-03-17 ENCOUNTER — Ambulatory Visit (INDEPENDENT_AMBULATORY_CARE_PROVIDER_SITE_OTHER): Payer: Medicare Other

## 2021-03-17 DIAGNOSIS — I428 Other cardiomyopathies: Secondary | ICD-10-CM | POA: Diagnosis not present

## 2021-03-17 LAB — CUP PACEART REMOTE DEVICE CHECK
Battery Remaining Longevity: 24 mo
Battery Voltage: 2.92 V
Brady Statistic AP VP Percent: 97.09 %
Brady Statistic AP VS Percent: 0.04 %
Brady Statistic AS VP Percent: 2.13 %
Brady Statistic AS VS Percent: 0.74 %
Brady Statistic RA Percent Paced: 58.02 %
Brady Statistic RV Percent Paced: 98.56 %
Date Time Interrogation Session: 20220330021107
Implantable Lead Implant Date: 20171211
Implantable Lead Implant Date: 20171211
Implantable Lead Implant Date: 20171211
Implantable Lead Location: 753858
Implantable Lead Location: 753859
Implantable Lead Location: 753860
Implantable Lead Model: 4298
Implantable Lead Model: 5076
Implantable Lead Model: 5076
Implantable Pulse Generator Implant Date: 20171211
Lead Channel Impedance Value: 1026 Ohm
Lead Channel Impedance Value: 1102 Ohm
Lead Channel Impedance Value: 285 Ohm
Lead Channel Impedance Value: 323 Ohm
Lead Channel Impedance Value: 342 Ohm
Lead Channel Impedance Value: 418 Ohm
Lead Channel Impedance Value: 475 Ohm
Lead Channel Impedance Value: 570 Ohm
Lead Channel Impedance Value: 570 Ohm
Lead Channel Impedance Value: 589 Ohm
Lead Channel Impedance Value: 722 Ohm
Lead Channel Impedance Value: 779 Ohm
Lead Channel Impedance Value: 912 Ohm
Lead Channel Impedance Value: 931 Ohm
Lead Channel Pacing Threshold Amplitude: 0.375 V
Lead Channel Pacing Threshold Amplitude: 1 V
Lead Channel Pacing Threshold Amplitude: 2.25 V
Lead Channel Pacing Threshold Pulse Width: 0.4 ms
Lead Channel Pacing Threshold Pulse Width: 0.4 ms
Lead Channel Pacing Threshold Pulse Width: 0.4 ms
Lead Channel Sensing Intrinsic Amplitude: 0.375 mV
Lead Channel Sensing Intrinsic Amplitude: 0.375 mV
Lead Channel Sensing Intrinsic Amplitude: 19.375 mV
Lead Channel Sensing Intrinsic Amplitude: 19.375 mV
Lead Channel Setting Pacing Amplitude: 1.5 V
Lead Channel Setting Pacing Amplitude: 2 V
Lead Channel Setting Pacing Amplitude: 4.5 V
Lead Channel Setting Pacing Pulse Width: 0.4 ms
Lead Channel Setting Pacing Pulse Width: 1 ms
Lead Channel Setting Sensing Sensitivity: 0.9 mV

## 2021-03-17 NOTE — Telephone Encounter (Signed)
Scheduled remote received for increased AF burden from 16% at last OV on 12/30/20 to 41.9% today (03/17/21).   Called patient to send manual send to assess presenting, assess s/s, medication compliance.   Meds on file include, Eliquis 5 mg BID, digoxin 0.125 mg daily.   No answer, LMOVM.

## 2021-03-30 ENCOUNTER — Other Ambulatory Visit: Payer: Self-pay | Admitting: Cardiovascular Disease

## 2021-03-30 NOTE — Progress Notes (Signed)
Remote pacemaker transmission.   

## 2021-04-02 ENCOUNTER — Ambulatory Visit (INDEPENDENT_AMBULATORY_CARE_PROVIDER_SITE_OTHER): Payer: Medicare Other

## 2021-04-02 DIAGNOSIS — I428 Other cardiomyopathies: Secondary | ICD-10-CM | POA: Diagnosis not present

## 2021-04-02 NOTE — Telephone Encounter (Signed)
Contacted patient for previous alert.Transmission received on 04/02/21 showing patient is still in A-fib. AT/AF burden is now 100%. Patient compliant with Eliquis 5mg  and digoxin 0.125 mg daily.Patient states that he is just not feeling well. Patient agreeable to be referred to the A-fib clinic. Patient also states edema in feet and ankles. Routing to Dr.Croitoru to make him aware.

## 2021-04-05 LAB — CUP PACEART REMOTE DEVICE CHECK
Battery Remaining Longevity: 24 mo
Battery Voltage: 2.92 V
Brady Statistic RA Percent Paced: 2.25 %
Brady Statistic RV Percent Paced: 98.12 %
Date Time Interrogation Session: 20220415020927
Implantable Lead Implant Date: 20171211
Implantable Lead Implant Date: 20171211
Implantable Lead Implant Date: 20171211
Implantable Lead Location: 753858
Implantable Lead Location: 753859
Implantable Lead Location: 753860
Implantable Lead Model: 4298
Implantable Lead Model: 5076
Implantable Lead Model: 5076
Implantable Pulse Generator Implant Date: 20171211
Lead Channel Impedance Value: 1064 Ohm
Lead Channel Impedance Value: 304 Ohm
Lead Channel Impedance Value: 323 Ohm
Lead Channel Impedance Value: 342 Ohm
Lead Channel Impedance Value: 399 Ohm
Lead Channel Impedance Value: 437 Ohm
Lead Channel Impedance Value: 456 Ohm
Lead Channel Impedance Value: 551 Ohm
Lead Channel Impedance Value: 570 Ohm
Lead Channel Impedance Value: 741 Ohm
Lead Channel Impedance Value: 893 Ohm
Lead Channel Impedance Value: 893 Ohm
Lead Channel Impedance Value: 912 Ohm
Lead Channel Impedance Value: 931 Ohm
Lead Channel Pacing Threshold Amplitude: 0.375 V
Lead Channel Pacing Threshold Amplitude: 1.125 V
Lead Channel Pacing Threshold Amplitude: 2.25 V
Lead Channel Pacing Threshold Pulse Width: 0.4 ms
Lead Channel Pacing Threshold Pulse Width: 0.4 ms
Lead Channel Pacing Threshold Pulse Width: 0.4 ms
Lead Channel Sensing Intrinsic Amplitude: 0.5 mV
Lead Channel Sensing Intrinsic Amplitude: 0.5 mV
Lead Channel Sensing Intrinsic Amplitude: 19.375 mV
Lead Channel Sensing Intrinsic Amplitude: 19.375 mV
Lead Channel Setting Pacing Amplitude: 1.5 V
Lead Channel Setting Pacing Amplitude: 2.5 V
Lead Channel Setting Pacing Amplitude: 4.5 V
Lead Channel Setting Pacing Pulse Width: 0.4 ms
Lead Channel Setting Pacing Pulse Width: 1 ms
Lead Channel Setting Sensing Sensitivity: 0.9 mV

## 2021-04-05 NOTE — Telephone Encounter (Signed)
We probably need to set him up for cardioversion. Thanks for seeing him. I will be in hospital all day tomorrow if you need me to come see him or have questions.

## 2021-04-06 ENCOUNTER — Ambulatory Visit (HOSPITAL_COMMUNITY)
Admission: RE | Admit: 2021-04-06 | Discharge: 2021-04-06 | Disposition: A | Discharge status: Home / Self Care | Payer: Medicare Other | Source: Ambulatory Visit | Attending: Physician Assistant | Admitting: Physician Assistant

## 2021-04-06 ENCOUNTER — Encounter (HOSPITAL_COMMUNITY): Payer: Self-pay | Admitting: Physician Assistant

## 2021-04-06 ENCOUNTER — Other Ambulatory Visit: Payer: Self-pay

## 2021-04-06 VITALS — BP 148/98 | HR 71 | Ht 70.0 in | Wt 200.8 lb

## 2021-04-06 DIAGNOSIS — Z885 Allergy status to narcotic agent status: Secondary | ICD-10-CM | POA: Insufficient documentation

## 2021-04-06 DIAGNOSIS — Z950 Presence of cardiac pacemaker: Secondary | ICD-10-CM | POA: Insufficient documentation

## 2021-04-06 DIAGNOSIS — I5042 Chronic combined systolic (congestive) and diastolic (congestive) heart failure: Secondary | ICD-10-CM | POA: Insufficient documentation

## 2021-04-06 DIAGNOSIS — G20 Parkinson's disease: Secondary | ICD-10-CM | POA: Diagnosis not present

## 2021-04-06 DIAGNOSIS — I4819 Other persistent atrial fibrillation: Secondary | ICD-10-CM | POA: Diagnosis present

## 2021-04-06 DIAGNOSIS — Z79899 Other long term (current) drug therapy: Secondary | ICD-10-CM | POA: Diagnosis not present

## 2021-04-06 DIAGNOSIS — I2510 Atherosclerotic heart disease of native coronary artery without angina pectoris: Secondary | ICD-10-CM | POA: Insufficient documentation

## 2021-04-06 DIAGNOSIS — Z8249 Family history of ischemic heart disease and other diseases of the circulatory system: Secondary | ICD-10-CM | POA: Diagnosis not present

## 2021-04-06 DIAGNOSIS — Z7901 Long term (current) use of anticoagulants: Secondary | ICD-10-CM | POA: Insufficient documentation

## 2021-04-06 DIAGNOSIS — D6869 Other thrombophilia: Secondary | ICD-10-CM

## 2021-04-06 LAB — BASIC METABOLIC PANEL
Anion gap: 6 (ref 5–15)
BUN: 9 mg/dL (ref 8–23)
CO2: 29 mmol/L (ref 22–32)
Calcium: 9.8 mg/dL (ref 8.9–10.3)
Chloride: 105 mmol/L (ref 98–111)
Creatinine, Ser: 0.99 mg/dL (ref 0.61–1.24)
GFR, Estimated: >60 mL/min (ref >60)
Glucose, Bld: 133 mg/dL — ABNORMAL HIGH (ref 70–99)
Potassium: 4.1 mmol/L (ref 3.5–5.1)
Sodium: 140 mmol/L (ref 135–145)

## 2021-04-06 LAB — CBC
HCT: 50.7 % (ref 39.0–52.0)
Hemoglobin: 16 g/dL (ref 13.0–17.0)
MCH: 27.7 pg (ref 26.0–34.0)
MCHC: 31.6 g/dL (ref 30.0–36.0)
MCV: 87.7 fL (ref 80.0–100.0)
Platelets: 161 10*3/uL (ref 150–400)
RBC: 5.78 MIL/uL (ref 4.22–5.81)
RDW: 13.4 % (ref 11.5–15.5)
WBC: 9 10*3/uL (ref 4.0–10.5)
nRBC: 0 % (ref 0.0–0.2)

## 2021-04-06 NOTE — Patient Instructions (Signed)
Cardioversion scheduled for Monday, May 2nd  - Arrive at the [facility] and go to admitting at [facility] not eat or drink anything after midnight the night prior to your procedure.  - Take all your morning medication (except diabetic medications) with a sip of water prior to arrival.  - You will not be able to drive home after your procedure.  - Do NOT miss any doses of your blood thinner - if you should miss a dose please notify our office immediately.  - If you feel as if you go back into normal rhythm prior to scheduled cardioversion, please notify our office immediately. If your procedure is canceled in the cardioversion suite you will be charged a cancellation fee.

## 2021-04-06 NOTE — Progress Notes (Signed)
Thank you :)

## 2021-04-06 NOTE — Progress Notes (Signed)
Primary Care Physician: Kristen Loader, FNP Primary Cardiologist: Dr Sallyanne Kuster  Primary Electrophysiologist: none Referring Physician: Dr Earl Lites is a 80 y.o. male with a history of CAD, Parkinson's disease, CHF, CRT-P, and atrial fibrillation who presents for consultation in the [facility]. Patient is on Eliquis for a CHADS2VASC score of 4. His afib burden on his device has increased recently and he has been persistently in afib for several weeks. He denies palpitations but has had more fatigue and exercise intolerance. He denies any bleeding issues on anticoagulation.   Today, he denies symptoms of palpitations, chest pain, orthopnea, PND, lower extremity edema, dizziness, presyncope, syncope, snoring, daytime somnolence, bleeding, or neurologic sequela. The patient is tolerating medications without difficulties and is otherwise without complaint today.    Atrial Fibrillation Risk Factors:  he does not have symptoms or diagnosis of sleep apnea. he does not have a history of rheumatic fever.   he has a BMI of Body mass index is 28.81 kg/m.Marland Kitchen Filed Weights   04/06/21 1537  Weight: 91.1 kg    Family History  Problem Relation Age of Onset  . Heart disease Mother   . Diabetes Mother   . Cancer Mother        breast  . Heart disease Father   . Heart attack Father   . Atrial fibrillation Sister   . Heart disease Brother   . Pancreatic disease Brother   . Tremor Brother   . Heart attack Paternal Grandfather   . Spina bifida Brother      Atrial Fibrillation Management history:  Previous antiarrhythmic drugs: none Previous cardioversions: none Previous ablations: none CHADS2VASC score: 4 Anticoagulation history: Eliquis   Past Medical History:  Diagnosis Date  . Anxiety   . Cataract    beginning stages  . Chronic low back pain 07/03/2019  . Coronary artery disease    stent- 2007   . History of kidney stones   .  Hypertension   . Parkinson's disease ([place]) 01/29/2018  . Presence of permanent cardiac pacemaker    2017   . Tremor of right hand    Past Surgical History:  Procedure Laterality Date  . APPENDECTOMY    . bbb    . CHOLECYSTECTOMY N/A 05/21/2018   Procedure: LAPAROSCOPIC CHOLECYSTECTOMY;  Surgeon: Coralie Keens, MD;  Location: [facility];  Service: General;  Laterality: N/A;  . left knee arthroscopy   1996  . PACEMAKER INSERTION  11/2016  . STENT PLACEMENT VASCULAR ([place] HX)  2007  . TONSILLECTOMY      Current Outpatient Medications  Medication Sig Dispense Refill  . acetaminophen (TYLENOL) 500 MG tablet Take 1,000 mg by mouth every 6 (six) hours as needed for moderate pain.    Marland Kitchen apixaban (ELIQUIS) 5 MG TABS tablet Take 1 tablet (5 mg total) by mouth 2 (two) times daily. 60 tablet 11  . atorvastatin (LIPITOR) 20 MG tablet TAKE 1 TABLET BY MOUTH EVERY DAY 90 tablet 1  . carbidopa-levodopa (SINEMET) 25-250 MG tablet Take 1 tablet by mouth 3 (three) times daily. 270 tablet 3  . digoxin (LANOXIN) 0.125 MG tablet TAKE 1 TABLET BY MOUTH EVERY DAY 90 tablet 3  . gabapentin (NEURONTIN) 600 MG tablet Take 1 tablet (600 mg total) by mouth 3 (three) times daily. 270 tablet 3  . Melatonin 5 MG CAPS melatonin    . traMADol (ULTRAM) 50 MG tablet Take 1 tablet (50 mg total) by mouth  every 6 (six) hours as needed. 15 tablet 0   No current facility-administered medications for this encounter.    Allergies  Allergen Reactions  . Codeine Itching    Social History   Socioeconomic History  . Marital status: Married    Spouse name: Not on file  . Number of children: 2  . Years of education: 23  . Highest education level: Not on file  Occupational History  . Not on file  Tobacco Use  . Smoking status: Never Smoker  . Smokeless tobacco: Never Used  Vaping Use  . Vaping Use: Never used  Substance and Sexual Activity  . Alcohol use: Yes    Comment: social   . Drug use: No  . Sexual activity:  Not Currently  Other Topics Concern  . Not on file  Social History Narrative   Lives w/ wife   Caffeine use: sometimes   Right handed    Social Determinants of Health   Financial Resource Strain: Not on file  Food Insecurity: Not on file  Transportation Needs: Not on file  Physical Activity: Not on file  Stress: Not on file  Social Connections: Not on file  Intimate Partner Violence: Not on file     ROS- All systems are reviewed and negative except as per the HPI above.  Physical Exam: Vitals:   04/06/21 1537  BP: (!) 148/98  Pulse: 71  Weight: 91.1 kg  Height: 5\' 10"  (1.778 m)    GEN- The patient is a well appearing elderly male, alert and oriented x 3 today.   Head- normocephalic, atraumatic Eyes-  Sclera clear, conjunctiva pink Ears- hearing intact Oropharynx- clear Neck- supple  Lungs- Clear to ausculation bilaterally, normal work of breathing Heart- Regular rate and rhythm, no murmurs, rubs or gallops  GI- soft, NT, ND, + BS Extremities- no clubbing, cyanosis, or edema MS- no significant deformity or atrophy Skin- no rash or lesion Psych- euthymic mood, full affect Neuro- strength and sensation are intact, pill rolling tremor right hand  Wt Readings from Last 3 Encounters:  04/06/21 91.1 kg  03/04/21 93 kg  12/30/20 92.5 kg    EKG today demonstrates  V paced rhythm with underlying afib Vent. rate 71 BPM PR interval * ms QRS duration 160 ms QT/QTcB 432/469 ms  Echo 02/20/18 demonstrated  Left ventricle: Abnormal septal motion. Wall thickness was  increased in a pattern of moderate LVH. Systolic function was  normal. The estimated ejection fraction was in the range of 50%  to 55%. Left ventricular diastolic function parameters were  normal.  - Aortic valve: Calcified non coronary cusp. There was mild  regurgitation.  - Left atrium: The atrium was mildly dilated.  - Atrial septum: No defect or patent foramen ovale was identified.  Epic  records are reviewed at length today  CHA2DS2-VASc Score = 4  The patient's score is based upon: CHF History: Yes HTN History: No Diabetes History: No Stroke History: No Vascular Disease History: Yes Age Score: 2 Gender Score: 0      ASSESSMENT AND PLAN: 1. Persistent Atrial Fibrillation (ICD10:  I48.19) The patient's CHA2DS2-VASc score is 4, indicating a 4.8% annual risk of stroke.   Device shows he is persistently in afib. We discussed therapeutic options today.  Will arrange for DCCV. Check bmet/CBC Continue Eliquis 5 mg BID, patient denies any missed doses in the last 3 weeks.  2. Secondary Hypercoagulable State (ICD10:  D68.69) The patient is at significant risk for stroke/thromboembolism based upon  his CHA2DS2-VASc Score of 4.  Continue Apixaban (Eliquis).   3. Chronic combined systolic and diastolic CHF S/p CRT-P No signs or symptoms of fluid overload.  4. CAD No anginal symptoms.   Follow up in the AF clinic one week post DCCV.    [facility] [address] [place], [place] [address] [phone] 04/06/2021 4:05 PM

## 2021-04-06 NOTE — Telephone Encounter (Signed)
Called and spoke with patient, he is aware of appt today 04/06/21 at 3:30 pm with Adline Peals, PA-C.

## 2021-04-07 ENCOUNTER — Other Ambulatory Visit: Payer: Self-pay | Admitting: Cardiovascular Disease

## 2021-04-07 NOTE — Telephone Encounter (Signed)
Prescription refill request for Eliquis received. Indication:atrial fib Last office visit:4/22  [place] Scr:0.99 4/22 Age:80  Weight:91.1 kg  Prescription refilled

## 2021-04-16 ENCOUNTER — Other Ambulatory Visit (HOSPITAL_COMMUNITY)
Admission: RE | Admit: 2021-04-16 | Discharge: 2021-04-16 | Disposition: A | Discharge status: Home / Self Care | Payer: Medicare Other | Source: Ambulatory Visit | Attending: Cardiology | Admitting: Cardiology

## 2021-04-16 DIAGNOSIS — Z01812 Encounter for preprocedural laboratory examination: Secondary | ICD-10-CM | POA: Diagnosis present

## 2021-04-16 DIAGNOSIS — Z20822 Contact with and (suspected) exposure to covid-19: Secondary | ICD-10-CM | POA: Diagnosis not present

## 2021-04-17 LAB — SARS CORONAVIRUS 2 (TAT 6-24 HRS): SARS Coronavirus 2: NEGATIVE

## 2021-04-19 ENCOUNTER — Encounter (HOSPITAL_COMMUNITY): Payer: Self-pay | Admitting: Cardiology

## 2021-04-19 ENCOUNTER — Encounter (HOSPITAL_COMMUNITY)
Admission: RE | Disposition: A | Discharge status: Home / Self Care | Payer: Self-pay | Source: Ambulatory Visit | Attending: Cardiology

## 2021-04-19 ENCOUNTER — Other Ambulatory Visit: Payer: Self-pay

## 2021-04-19 ENCOUNTER — Ambulatory Visit (HOSPITAL_COMMUNITY): Payer: Medicare Other | Admitting: Certified Registered Nurse Anesthetist

## 2021-04-19 ENCOUNTER — Ambulatory Visit (HOSPITAL_COMMUNITY)
Admission: RE | Admit: 2021-04-19 | Discharge: 2021-04-19 | Disposition: A | Discharge status: Home / Self Care | Payer: Medicare Other | Source: Ambulatory Visit | Attending: Cardiology | Admitting: Cardiology

## 2021-04-19 DIAGNOSIS — I5042 Chronic combined systolic (congestive) and diastolic (congestive) heart failure: Secondary | ICD-10-CM | POA: Insufficient documentation

## 2021-04-19 DIAGNOSIS — I2510 Atherosclerotic heart disease of native coronary artery without angina pectoris: Secondary | ICD-10-CM | POA: Diagnosis not present

## 2021-04-19 DIAGNOSIS — Z79899 Other long term (current) drug therapy: Secondary | ICD-10-CM | POA: Diagnosis not present

## 2021-04-19 DIAGNOSIS — D6869 Other thrombophilia: Secondary | ICD-10-CM | POA: Insufficient documentation

## 2021-04-19 DIAGNOSIS — I4819 Other persistent atrial fibrillation: Secondary | ICD-10-CM | POA: Insufficient documentation

## 2021-04-19 DIAGNOSIS — Z7901 Long term (current) use of anticoagulants: Secondary | ICD-10-CM | POA: Diagnosis not present

## 2021-04-19 HISTORY — PX: CARDIOVERSION: SHX1299

## 2021-04-19 SURGERY — CARDIOVERSION
Anesthesia: General

## 2021-04-19 MED ORDER — PROPOFOL 10 MG/ML IV BOLUS
INTRAVENOUS | Status: DC | PRN
  Administered 2021-04-19: 80 mg via INTRAVENOUS

## 2021-04-19 MED ORDER — LIDOCAINE 2% (20 MG/ML) 5 ML SYRINGE
INTRAMUSCULAR | Status: DC | PRN
  Administered 2021-04-19: 60 mg via INTRAVENOUS

## 2021-04-19 MED ORDER — SODIUM CHLORIDE 0.9 % IV SOLN: INTRAVENOUS | Status: DC | PRN

## 2021-04-19 NOTE — Discharge Instructions (Signed)
Electrical Cardioversion Electrical cardioversion is the delivery of a jolt of electricity to restore a normal rhythm to the heart. A rhythm that is too fast or is not regular keeps the heart from pumping well. In this procedure, sticky patches or metal paddles are placed on the chest to deliver electricity to the heart from a device. This procedure may be done in an emergency if:  There is low or no blood pressure as a result of the heart rhythm.  Normal rhythm must be restored as fast as possible to protect the brain and heart from further damage.  It may save a life. This may also be a scheduled procedure for irregular or fast heart rhythms that are not immediately life-threatening. Tell a health care provider about:  Any allergies you have.  All medicines you are taking, including vitamins, herbs, eye drops, creams, and over-the-counter medicines.  Any problems you or family members have had with anesthetic medicines.  Any blood disorders you have.  Any surgeries you have had.  Any medical conditions you have.  Whether you are pregnant or may be pregnant. What are the risks? Generally, this is a safe procedure. However, problems may occur, including:  Allergic reactions to medicines.  A blood clot that breaks free and travels to other parts of your body.  The possible return of an abnormal heart rhythm within hours or days after the procedure.  Your heart stopping (cardiac arrest). This is rare. What happens before the procedure? Medicines  Your health care provider may have you start taking: ? Blood-thinning medicines (anticoagulants) so your blood does not clot as easily. ? Medicines to help stabilize your heart rate and rhythm.  Ask your health care provider about: ? Changing or stopping your regular medicines. This is especially important if you are taking diabetes medicines or blood thinners. ? Taking medicines such as aspirin and ibuprofen. These medicines can  thin your blood. Do not take these medicines unless your health care provider tells you to take them. ? Taking over-the-counter medicines, vitamins, herbs, and supplements. General instructions  Follow instructions from your health care provider about eating or drinking restrictions.  Plan to have someone take you home from the hospital or clinic.  If you will be going home right after the procedure, plan to have someone with you for 24 hours.  Ask your health care provider what steps will be taken to help prevent infection. These may include washing your skin with a germ-killing soap. What happens during the procedure?  An IV will be inserted into one of your veins.  Sticky patches (electrodes) or metal paddles may be placed on your chest.  You will be given a medicine to help you relax (sedative).  An electrical shock will be delivered. The procedure may vary among health care providers and hospitals.   What can I expect after the procedure?  Your blood pressure, heart rate, breathing rate, and blood oxygen level will be monitored until you leave the hospital or clinic.  Your heart rhythm will be watched to make sure it does not change.  You may have some redness on the skin where the shocks were given. Follow these instructions at home:  Do not drive for 24 hours if you were given a sedative during your procedure.  Take over-the-counter and prescription medicines only as told by your health care provider.  Ask your health care provider how to check your pulse. Check it often.  Rest for 48 hours after the procedure   or as told by your health care provider.  Avoid or limit your caffeine use as told by your health care provider.  Keep all follow-up visits as told by your health care provider. This is important. Contact a health care provider if:  You feel like your heart is beating too quickly or your pulse is not regular.  You have a serious muscle cramp that does not go  away. Get help right away if:  You have discomfort in your chest.  You are dizzy or you feel faint.  You have trouble breathing or you are short of breath.  Your speech is slurred.  You have trouble moving an arm or leg on one side of your body.  Your fingers or toes turn cold or blue. Summary  Electrical cardioversion is the delivery of a jolt of electricity to restore a normal rhythm to the heart.  This procedure may be done right away in an emergency or may be a scheduled procedure if the condition is not an emergency.  Generally, this is a safe procedure.  After the procedure, check your pulse often as told by your health care provider. This information is not intended to replace advice given to you by your health care provider. Make sure you discuss any questions you have with your health care provider. Document Revised: 07/08/2019 Document Reviewed: 07/08/2019 Elsevier Patient Education  2021 Elsevier Inc.  

## 2021-04-19 NOTE — Transfer of Care (Signed)
Immediate Anesthesia Transfer of Care Note  Patient: Marcus Fuller  Procedure(s) Performed: CARDIOVERSION (N/A )  Patient Location: Endoscopy Unit  Anesthesia Type:General  Level of Consciousness: drowsy and patient cooperative  Airway & Oxygen Therapy: Patient Spontanous Breathing  Post-op Assessment: Report given to RN and Post -op Vital signs reviewed and stable  Post vital signs: Reviewed and stable  Last Vitals:  Vitals Value Taken Time  BP    Temp    Pulse    Resp    SpO2      Last Pain:  Vitals:   04/19/21 0930  TempSrc: Temporal  PainSc: 0-No pain         Complications: No complications documented.

## 2021-04-19 NOTE — Interval H&P Note (Signed)
History and Physical Interval Note:  04/19/2021 10:11 AM  Marcus Fuller  has presented today for surgery, with the diagnosis of AFIB.  The various methods of treatment have been discussed with the patient and family. After consideration of risks, benefits and other options for treatment, the patient has consented to  Procedure(s): CARDIOVERSION (N/A) as a surgical intervention.  The patient's history has been reviewed, patient examined, no change in status, stable for surgery.  I have reviewed the patient's chart and labs.  Questions were answered to the patient's satisfaction.     Kirk Ruths

## 2021-04-19 NOTE — Progress Notes (Signed)
Remote pacemaker transmission.   

## 2021-04-19 NOTE — H&P (Signed)
ATRIAL FIB OFFICE VISIT  04/06/2021 [facility]   Fenton, Flat R, PA  Cardiology  Persistent atrial fibrillation ([place]) +1 more  Dx  Referred by Kristen Loader, FNP  Reason for Visit    Additional Documentation  Vitals:  BP 148/98Important   Pulse 71  Ht 5\' 10"  (1.778 m)  Wt 91.1 kg  BMI 28.81 kg/m  BSA 2.12 m  Flowsheets:  CHADS2-VASc Score,  NEWS,  MEWS Score,  Anthropometrics,  Method of Visit    Encounter Info:  Billing Info,  History,  Allergies,  Detailed Report     All Notes   Progress Notes by Sanda Klein, MD at 04/06/2021 3:30 PM  Author: Sanda Klein, MD Author Type: Physician Filed: 04/06/2021 4:08 PM  Note Status: Signed Cosign: Cosign Not Required Date of Service: 04/06/2021 3:30 PM  Editor: Sanda Klein, MD (Physician)               Thank you!       Progress Notes by Oliver Barre, PA at 04/06/2021 3:30 PM  Author: Oliver Barre, PA Author Type: Physician Assistant Filed: 04/06/2021 4:05 PM  Note Status: Signed Cosign: Cosign Not Required Date of Service: 04/06/2021 3:30 PM  Editor: Oliver Barre, PA (Physician Assistant)                  Primary Care Physician: Kristen Loader, FNP Primary Cardiologist: Dr Sallyanne Kuster  Primary Electrophysiologist: none Referring Physician: Dr Earl Lites is a 80 y.o. male with a history of CAD, Parkinson's disease, CHF, CRT-P, and atrial fibrillation who presents for consultation in the [facility]. Patient is on Eliquis for a CHADS2VASC score of 4. His afib burden on his device has increased recently and he has been persistently in afib for several weeks. He denies palpitations but has had more fatigue and exercise intolerance. He denies any bleeding issues on anticoagulation.   Today, he denies symptoms of palpitations, chest pain, orthopnea, PND, lower extremity edema, dizziness, presyncope, syncope, snoring,  daytime somnolence, bleeding, or neurologic sequela. The patient is tolerating medications without difficulties and is otherwise without complaint today.    Atrial Fibrillation Risk Factors:  he does not have symptoms or diagnosis of sleep apnea. he does not have a history of rheumatic fever.   he has a BMI of Body mass index is 28.81 kg/m.Marland Kitchen    Filed Weights   04/06/21 1537  Weight: 91.1 kg         Family History  Problem Relation Age of Onset  . Heart disease Mother   . Diabetes Mother   . Cancer Mother        breast  . Heart disease Father   . Heart attack Father   . Atrial fibrillation Sister   . Heart disease Brother   . Pancreatic disease Brother   . Tremor Brother   . Heart attack Paternal Grandfather   . Spina bifida Brother      Atrial Fibrillation Management history:  Previous antiarrhythmic drugs: none Previous cardioversions: none Previous ablations: none CHADS2VASC score: 4 Anticoagulation history: Eliquis       Past Medical History:  Diagnosis Date  . Anxiety   . Cataract    beginning stages  . Chronic low back pain 07/03/2019  . Coronary artery disease    stent- 2007   . History of kidney stones   . Hypertension   . Parkinson's disease ([place])  01/29/2018  . Presence of permanent cardiac pacemaker    2017   . Tremor of right hand         Past Surgical History:  Procedure Laterality Date  . APPENDECTOMY    . bbb    . CHOLECYSTECTOMY N/A 05/21/2018   Procedure: LAPAROSCOPIC CHOLECYSTECTOMY;  Surgeon: Coralie Keens, MD;  Location: [facility];  Service: General;  Laterality: N/A;  . left knee arthroscopy   1996  . PACEMAKER INSERTION  11/2016  . STENT PLACEMENT VASCULAR ([place] HX)  2007  . TONSILLECTOMY            Current Outpatient Medications  Medication Sig Dispense Refill  . acetaminophen (TYLENOL) 500 MG tablet Take 1,000 mg by mouth every 6 (six) hours as needed for moderate pain.     Marland Kitchen apixaban (ELIQUIS) 5 MG TABS tablet Take 1 tablet (5 mg total) by mouth 2 (two) times daily. 60 tablet 11  . atorvastatin (LIPITOR) 20 MG tablet TAKE 1 TABLET BY MOUTH EVERY DAY 90 tablet 1  . carbidopa-levodopa (SINEMET) 25-250 MG tablet Take 1 tablet by mouth 3 (three) times daily. 270 tablet 3  . digoxin (LANOXIN) 0.125 MG tablet TAKE 1 TABLET BY MOUTH EVERY DAY 90 tablet 3  . gabapentin (NEURONTIN) 600 MG tablet Take 1 tablet (600 mg total) by mouth 3 (three) times daily. 270 tablet 3  . Melatonin 5 MG CAPS melatonin    . traMADol (ULTRAM) 50 MG tablet Take 1 tablet (50 mg total) by mouth every 6 (six) hours as needed. 15 tablet 0   No current facility-administered medications for this encounter.        Allergies  Allergen Reactions  . Codeine Itching    Social History        Socioeconomic History  . Marital status: Married    Spouse name: Not on file  . Number of children: 2  . Years of education: 52  . Highest education level: Not on file  Occupational History  . Not on file  Tobacco Use  . Smoking status: Never Smoker  . Smokeless tobacco: Never Used  Vaping Use  . Vaping Use: Never used  Substance and Sexual Activity  . Alcohol use: Yes    Comment: social   . Drug use: No  . Sexual activity: Not Currently  Other Topics Concern  . Not on file  Social History Narrative   Lives w/ wife   Caffeine use: sometimes   Right handed    Social Determinants of Health   Financial Resource Strain: Not on file  Food Insecurity: Not on file  Transportation Needs: Not on file  Physical Activity: Not on file  Stress: Not on file  Social Connections: Not on file  Intimate Partner Violence: Not on file     ROS- All systems are reviewed and negative except as per the HPI above.  Physical Exam:    Vitals:   04/06/21 1537  BP: (!) 148/98  Pulse: 71  Weight: 91.1 kg  Height: 5\' 10"  (1.778 m)    GEN- The patient is a well appearing  elderly male, alert and oriented x 3 today.   Head- normocephalic, atraumatic Eyes-  Sclera clear, conjunctiva pink Ears- hearing intact Oropharynx- clear Neck- supple  Lungs- Clear to ausculation bilaterally, normal work of breathing Heart- Regular rate and rhythm, no murmurs, rubs or gallops  GI- soft, NT, ND, + BS Extremities- no clubbing, cyanosis, or edema MS- no significant deformity or atrophy Skin- no  rash or lesion Psych- euthymic mood, full affect Neuro- strength and sensation are intact, pill rolling tremor right hand     Wt Readings from Last 3 Encounters:  04/06/21 91.1 kg  03/04/21 93 kg  12/30/20 92.5 kg    EKG today demonstrates  V paced rhythm with underlying afib Vent. rate 71 BPM PR interval * ms QRS duration 160 ms QT/QTcB 432/469 ms  Echo 02/20/18 demonstrated  Left ventricle: Abnormal septal motion. Wall thickness was  increased in a pattern of moderate LVH. Systolic function was  normal. The estimated ejection fraction was in the range of 50%  to 55%. Left ventricular diastolic function parameters were  normal.  - Aortic valve: Calcified non coronary cusp. There was mild  regurgitation.  - Left atrium: The atrium was mildly dilated.  - Atrial septum: No defect or patent foramen ovale was identified.  Epic records are reviewed at length today  CHA2DS2-VASc Score = 4  The patient's score is based upon: CHF History: Yes HTN History: No Diabetes History: No Stroke History: No Vascular Disease History: Yes Age Score: 2 Gender Score: 0      ASSESSMENT AND PLAN: 1. Persistent Atrial Fibrillation (ICD10:  I48.19) The patient's CHA2DS2-VASc score is 4, indicating a 4.8% annual risk of stroke.   Device shows he is persistently in afib. We discussed therapeutic options today.  Will arrange for DCCV. Check bmet/CBC Continue Eliquis 5 mg BID, patient denies any missed doses in the last 3 weeks.  2. Secondary Hypercoagulable  State (ICD10:  D68.69) The patient is at significant risk for stroke/thromboembolism based upon his CHA2DS2-VASc Score of 4.  Continue Apixaban (Eliquis).   3. Chronic combined systolic and diastolic CHF S/p CRT-P No signs or symptoms of fluid overload.  4. CAD No anginal symptoms.   Follow up in the AF clinic one week post DCCV.    [facility] [place], [place] [address] [phone] 04/06/2021 4:05 PM       For DCCV; compliant with apixaban; no changes Kirk Ruths

## 2021-04-19 NOTE — Anesthesia Preprocedure Evaluation (Signed)
Anesthesia Evaluation    Reviewed: Allergy & Precautions, Patient's Chart, lab work & pertinent test results  Airway Mallampati: II  TM Distance: >3 FB Neck ROM: Full    Dental  (+) Partial Upper, Missing   Pulmonary neg pulmonary ROS,    Pulmonary exam normal breath sounds clear to auscultation       Cardiovascular hypertension, + CAD, + Cardiac Stents and +CHF  + dysrhythmias Atrial Fibrillation + pacemaker  Rhythm:Irregular Rate:Normal     Neuro/Psych Anxiety Parkinson's disease Tremor of right hand    GI/Hepatic negative GI ROS, Neg liver ROS,   Endo/Other  negative endocrine ROS  Renal/GU negative Renal ROS     Musculoskeletal negative musculoskeletal ROS (+)   Abdominal   Peds  Hematology HLD   Anesthesia Other Findings A-FIB  Reproductive/Obstetrics                             Anesthesia Physical Anesthesia Plan  ASA: III  Anesthesia Plan: General   Post-op Pain Management:    Induction: Intravenous  PONV Risk Score and Plan: 2 and Treatment may vary due to age or medical condition and Propofol infusion  Airway Management Planned: Mask  Additional Equipment:   Intra-op Plan:   Post-operative Plan:   Informed Consent: I have reviewed the patients History and Physical, chart, labs and discussed the procedure including the risks, benefits and alternatives for the proposed anesthesia with the patient or authorized representative who has indicated his/her understanding and acceptance.     Dental advisory given  Plan Discussed with: CRNA  Anesthesia Plan Comments:         Anesthesia Quick Evaluation

## 2021-04-19 NOTE — Procedures (Signed)
Electrical Cardioversion Procedure Note Marcus Fuller [id number] [birth year]  Procedure: Electrical Cardioversion Indications:  Atrial Fibrillation  Procedure Details Consent: Risks of procedure as well as the alternatives and risks of each were explained to the (patient/caregiver).  Consent for procedure obtained. Time Out: Verified patient identification, verified procedure, site/side was marked, verified correct patient position, special equipment/implants available, medications/allergies/relevent history reviewed, required imaging and test results available.  Performed  Patient placed on cardiac monitor, pulse oximetry, supplemental oxygen as necessary.  Sedation given: Pt sedated by anesthesia with lidocaine 60 mg and diprovan 80 mg IV. Pacer pads placed anterior and posterior chest.  Cardioverted 1 time(s).  Cardioverted at [place].  Evaluation Findings: Post procedure EKG shows: NSR Complications: None Patient did tolerate procedure well.   Kirk Ruths 04/19/2021, 10:10 AM

## 2021-04-19 NOTE — Anesthesia Postprocedure Evaluation (Signed)
Anesthesia Post Note  Patient: LEWIN PELLOW  Procedure(s) Performed: CARDIOVERSION (N/A )     Patient location during evaluation: Endoscopy Anesthesia Type: General Level of consciousness: awake Pain management: pain level controlled Vital Signs Assessment: post-procedure vital signs reviewed and stable Respiratory status: spontaneous breathing, nonlabored ventilation, respiratory function stable and patient connected to nasal cannula oxygen Cardiovascular status: blood pressure returned to baseline and stable Postop Assessment: no apparent nausea or vomiting Anesthetic complications: no   No complications documented.  Last Vitals:  Vitals:   04/19/21 1135 04/19/21 1145  BP: (!) 166/98 (!) 163/96  Pulse:  67  Resp:  19  Temp:    SpO2:  96%    Last Pain:  Vitals:   04/19/21 1145  TempSrc:   PainSc: 0-No pain                 Loreen Bankson P Alazia Crocket

## 2021-04-21 ENCOUNTER — Encounter (HOSPITAL_COMMUNITY): Payer: Self-pay | Admitting: Cardiology

## 2021-04-26 ENCOUNTER — Encounter (HOSPITAL_COMMUNITY): Payer: Self-pay | Admitting: Physician Assistant

## 2021-04-26 ENCOUNTER — Other Ambulatory Visit: Payer: Self-pay

## 2021-04-26 ENCOUNTER — Ambulatory Visit (HOSPITAL_COMMUNITY)
Admission: RE | Admit: 2021-04-26 | Discharge: 2021-04-26 | Disposition: A | Discharge status: Home / Self Care | Payer: Medicare Other | Source: Ambulatory Visit | Attending: Physician Assistant | Admitting: Physician Assistant

## 2021-04-26 VITALS — BP 118/72 | HR 91 | Ht 70.0 in | Wt 201.4 lb

## 2021-04-26 DIAGNOSIS — Z950 Presence of cardiac pacemaker: Secondary | ICD-10-CM | POA: Insufficient documentation

## 2021-04-26 DIAGNOSIS — Z8249 Family history of ischemic heart disease and other diseases of the circulatory system: Secondary | ICD-10-CM | POA: Insufficient documentation

## 2021-04-26 DIAGNOSIS — I4819 Other persistent atrial fibrillation: Secondary | ICD-10-CM | POA: Diagnosis not present

## 2021-04-26 DIAGNOSIS — I5042 Chronic combined systolic (congestive) and diastolic (congestive) heart failure: Secondary | ICD-10-CM | POA: Insufficient documentation

## 2021-04-26 DIAGNOSIS — G20 Parkinson's disease: Secondary | ICD-10-CM | POA: Insufficient documentation

## 2021-04-26 DIAGNOSIS — D6869 Other thrombophilia: Secondary | ICD-10-CM

## 2021-04-26 DIAGNOSIS — Z7901 Long term (current) use of anticoagulants: Secondary | ICD-10-CM | POA: Diagnosis not present

## 2021-04-26 DIAGNOSIS — Z79899 Other long term (current) drug therapy: Secondary | ICD-10-CM | POA: Insufficient documentation

## 2021-04-26 DIAGNOSIS — Z885 Allergy status to narcotic agent status: Secondary | ICD-10-CM | POA: Diagnosis not present

## 2021-04-26 DIAGNOSIS — I2510 Atherosclerotic heart disease of native coronary artery without angina pectoris: Secondary | ICD-10-CM | POA: Diagnosis not present

## 2021-04-26 NOTE — Progress Notes (Signed)
Primary Care Physician: Kristen Loader, FNP Primary Cardiologist: Dr Sallyanne Kuster  Primary Electrophysiologist: none Referring Physician: Dr Earl Lites is a 80 y.o. male with a history of CAD, Parkinson's disease, CHF, CRT-P, and atrial fibrillation who presents for follow up in the [facility]. Patient is on Eliquis for a CHADS2VASC score of 4. He was noted to be persistently in afib for several weeks. He denied palpitations but did have more fatigue and exercise intolerance.   On follow up today, patient is s/p DCCV on 04/19/21. He reports that he feels well post procedure. He does feel like he might have more energy. He denies any bleeding issues on anticoagulation.   Today, he denies symptoms of palpitations, chest pain, orthopnea, PND, lower extremity edema, dizziness, presyncope, syncope, snoring, daytime somnolence, bleeding, or neurologic sequela. The patient is tolerating medications without difficulties and is otherwise without complaint today.    Atrial Fibrillation Risk Factors:  he does not have symptoms or diagnosis of sleep apnea. he does not have a history of rheumatic fever.   he has a BMI of Body mass index is 28.9 kg/m.Marland Kitchen Filed Weights   04/26/21 1316  Weight: 91.4 kg    Family History  Problem Relation Age of Onset  . Heart disease Mother   . Diabetes Mother   . Cancer Mother        breast  . Heart disease Father   . Heart attack Father   . Atrial fibrillation Sister   . Heart disease Brother   . Pancreatic disease Brother   . Tremor Brother   . Heart attack Paternal Grandfather   . Spina bifida Brother      Atrial Fibrillation Management history:  Previous antiarrhythmic drugs: none Previous cardioversions: 04/19/21 Previous ablations: none CHADS2VASC score: 4 Anticoagulation history: Eliquis   Past Medical History:  Diagnosis Date  . Anxiety   . Cataract    beginning stages  . Chronic low back  pain 07/03/2019  . Coronary artery disease    stent- 2007   . History of kidney stones   . Hypertension   . Parkinson's disease ([place]) 01/29/2018  . Presence of permanent cardiac pacemaker    2017   . Tremor of right hand    Past Surgical History:  Procedure Laterality Date  . APPENDECTOMY    . bbb    . CARDIOVERSION N/A 04/19/2021   Procedure: CARDIOVERSION;  Surgeon: Lelon Perla, MD;  Location[facility];  Service: Cardiovascular;  Laterality: N/A;  . CHOLECYSTECTOMY N/A 05/21/2018   Procedure: LAPAROSCOPIC CHOLECYSTECTOMY;  Surgeon: Coralie Keens, MD;  Location: [facility]S;  Service: General;  Laterality: N/A;  . left knee arthroscopy   1996  . PACEMAKER INSERTION  11/2016  . STENT PLACEMENT VASCULAR ([place] HX)  2007  . TONSILLECTOMY      Current Outpatient Medications  Medication Sig Dispense Refill  . acetaminophen (TYLENOL) 500 MG tablet Take 1,000 mg by mouth every 6 (six) hours as needed for moderate pain.    Marland Kitchen atorvastatin (LIPITOR) 20 MG tablet TAKE 1 TABLET BY MOUTH EVERY DAY 90 tablet 1  . carbidopa-levodopa (SINEMET) 25-250 MG tablet Take 1 tablet by mouth 3 (three) times daily. 270 tablet 3  . Diclofenac Sodium (ASPERCREME ARTHRITIS PAIN EX) Apply 1 application topically daily as needed (pain).    Marland Kitchen digoxin (LANOXIN) 0.125 MG tablet TAKE 1 TABLET BY MOUTH EVERY DAY 90 tablet 3  . ELIQUIS 5  MG TABS tablet TAKE 1 TABLET BY MOUTH TWICE A DAY 60 tablet 5  . gabapentin (NEURONTIN) 600 MG tablet Take 1 tablet (600 mg total) by mouth 3 (three) times daily. 270 tablet 3  . Melatonin 10 MG TABS Take 10 mg by mouth at bedtime.    Marland Kitchen OVER THE COUNTER MEDICATION Apply 1 application topically at bedtime. Outback Relief    . traMADol (ULTRAM) 50 MG tablet Take 1 tablet (50 mg total) by mouth every 6 (six) hours as needed. 15 tablet 0   No current facility-administered medications for this encounter.    Allergies  Allergen Reactions  . Codeine Itching    Social History    Socioeconomic History  . Marital status: Married    Spouse name: Not on file  . Number of children: 2  . Years of education: 39  . Highest education level: Not on file  Occupational History  . Not on file  Tobacco Use  . Smoking status: Never Smoker  . Smokeless tobacco: Never Used  Vaping Use  . Vaping Use: Never used  Substance and Sexual Activity  . Alcohol use: Yes    Comment: social   . Drug use: No  . Sexual activity: Not Currently  Other Topics Concern  . Not on file  Social History Narrative   Lives w/ wife   Caffeine use: sometimes   Right handed    Social Determinants of Health   Financial Resource Strain: Not on file  Food Insecurity: Not on file  Transportation Needs: Not on file  Physical Activity: Not on file  Stress: Not on file  Social Connections: Not on file  Intimate Partner Violence: Not on file     ROS- All systems are reviewed and negative except as per the HPI above.  Physical Exam: Vitals:   04/26/21 1316  BP: 118/72  Pulse: 91  Weight: 91.4 kg  Height: 5\' 10"  (1.778 m)    GEN- The patient is a well appearing elderly male, alert and oriented x 3 today.   HEENT-head normocephalic, atraumatic, sclera clear, conjunctiva pink, hearing intact, trachea midline. Lungs- Clear to ausculation bilaterally, normal work of breathing Heart- Regular rate and rhythm, no murmurs, rubs or gallops  GI- soft, NT, ND, + BS Extremities- no clubbing, cyanosis, or edema MS- no significant deformity or atrophy Skin- no rash or lesion Psych- euthymic mood, full affect Neuro- strength and sensation are intact, right hand tremor    Wt Readings from Last 3 Encounters:  04/26/21 91.4 kg  04/19/21 91.1 kg  04/06/21 91.1 kg    EKG today demonstrates  AV dual paced rhythm  Vent. rate 91 BPM PR interval 106 ms QRS duration 156 ms QT/QTcB 400/492 ms  Echo 02/20/18 demonstrated  Left ventricle: Abnormal septal motion. Wall thickness was  increased in  a pattern of moderate LVH. Systolic function was  normal. The estimated ejection fraction was in the range of 50%  to 55%. Left ventricular diastolic function parameters were  normal.  - Aortic valve: Calcified non coronary cusp. There was mild  regurgitation.  - Left atrium: The atrium was mildly dilated.  - Atrial septum: No defect or patent foramen ovale was identified.  Epic records are reviewed at length today  CHA2DS2-VASc Score = 4  The patient's score is based upon: CHF History: Yes HTN History: No Diabetes History: No Stroke History: No Vascular Disease History: Yes Age Score: 2 Gender Score: 0      ASSESSMENT AND PLAN:  1. Persistent Atrial Fibrillation (ICD10:  I48.19) The patient's CHA2DS2-VASc score is 4, indicating a 4.8% annual risk of stroke.   S/p DCCV on 04/19/21 Patient back in SR. He reports that he does not feel substantially different in SR. He may be a good candidate for rate control in the future. Continue Eliquis 5 mg BID  2. Secondary Hypercoagulable State (ICD10:  D68.69) The patient is at significant risk for stroke/thromboembolism based upon his CHA2DS2-VASc Score of 4.  Continue Apixaban (Eliquis).   3. Chronic combined systolic and diastolic CHF S/p CRT-P No signs or symptoms of fluid overload.  4. CAD No anginal symptoms.   Follow up with Dr Sallyanne Kuster per recall.    [facility] [address] [place], [place] [address] [phone] 04/26/2021 1:25 PM

## 2021-04-27 NOTE — Progress Notes (Signed)
Thank you.  It was worth a shot, but if he is not any better I agree we should not cardiovert again in the future.

## 2021-06-16 ENCOUNTER — Ambulatory Visit (INDEPENDENT_AMBULATORY_CARE_PROVIDER_SITE_OTHER): Payer: Medicare Other

## 2021-06-16 DIAGNOSIS — I5022 Chronic systolic (congestive) heart failure: Secondary | ICD-10-CM

## 2021-06-16 LAB — CUP PACEART REMOTE DEVICE CHECK
Battery Remaining Longevity: 21 mo
Battery Voltage: 2.91 V
Brady Statistic RA Percent Paced: 1.2 %
Brady Statistic RV Percent Paced: 97.19 %
Date Time Interrogation Session: 20220629021226
Implantable Lead Implant Date: 20171211
Implantable Lead Implant Date: 20171211
Implantable Lead Implant Date: 20171211
Implantable Lead Location: 753858
Implantable Lead Location: 753859
Implantable Lead Location: 753860
Implantable Lead Model: 4298
Implantable Lead Model: 5076
Implantable Lead Model: 5076
Implantable Pulse Generator Implant Date: 20171211
Lead Channel Impedance Value: 1007 Ohm
Lead Channel Impedance Value: 1007 Ohm
Lead Channel Impedance Value: 1083 Ohm
Lead Channel Impedance Value: 1102 Ohm
Lead Channel Impedance Value: 1159 Ohm
Lead Channel Impedance Value: 285 Ohm
Lead Channel Impedance Value: 342 Ohm
Lead Channel Impedance Value: 342 Ohm
Lead Channel Impedance Value: 437 Ohm
Lead Channel Impedance Value: 513 Ohm
Lead Channel Impedance Value: 608 Ohm
Lead Channel Impedance Value: 627 Ohm
Lead Channel Impedance Value: 684 Ohm
Lead Channel Impedance Value: 836 Ohm
Lead Channel Pacing Threshold Amplitude: 0.5 V
Lead Channel Pacing Threshold Amplitude: 1.125 V
Lead Channel Pacing Threshold Amplitude: 2.25 V
Lead Channel Pacing Threshold Pulse Width: 0.4 ms
Lead Channel Pacing Threshold Pulse Width: 0.4 ms
Lead Channel Pacing Threshold Pulse Width: 0.4 ms
Lead Channel Sensing Intrinsic Amplitude: 0.625 mV
Lead Channel Sensing Intrinsic Amplitude: 0.625 mV
Lead Channel Sensing Intrinsic Amplitude: 15.75 mV
Lead Channel Sensing Intrinsic Amplitude: 15.75 mV
Lead Channel Setting Pacing Amplitude: 1.5 V
Lead Channel Setting Pacing Amplitude: 2.5 V
Lead Channel Setting Pacing Amplitude: 4.5 V
Lead Channel Setting Pacing Pulse Width: 0.4 ms
Lead Channel Setting Pacing Pulse Width: 1 ms
Lead Channel Setting Sensing Sensitivity: 0.9 mV

## 2021-06-28 DIAGNOSIS — M9905 Segmental and somatic dysfunction of pelvic region: Secondary | ICD-10-CM | POA: Diagnosis not present

## 2021-06-28 DIAGNOSIS — M9903 Segmental and somatic dysfunction of lumbar region: Secondary | ICD-10-CM | POA: Diagnosis not present

## 2021-06-28 DIAGNOSIS — M5116 Intervertebral disc disorders with radiculopathy, lumbar region: Secondary | ICD-10-CM | POA: Diagnosis not present

## 2021-06-28 DIAGNOSIS — M25551 Pain in right hip: Secondary | ICD-10-CM | POA: Diagnosis not present

## 2021-06-30 NOTE — Progress Notes (Signed)
Remote pacemaker transmission.   

## 2021-07-01 ENCOUNTER — Ambulatory Visit: Payer: Self-pay

## 2021-07-01 ENCOUNTER — Other Ambulatory Visit: Payer: Self-pay | Admitting: Neurology

## 2021-07-01 ENCOUNTER — Ambulatory Visit: Payer: Medicare Other | Admitting: Family Medicine

## 2021-07-01 ENCOUNTER — Encounter: Payer: Self-pay | Admitting: Family Medicine

## 2021-07-01 ENCOUNTER — Other Ambulatory Visit: Payer: Self-pay

## 2021-07-01 DIAGNOSIS — M79604 Pain in right leg: Secondary | ICD-10-CM

## 2021-07-01 DIAGNOSIS — Z950 Presence of cardiac pacemaker: Secondary | ICD-10-CM | POA: Diagnosis not present

## 2021-07-01 MED ORDER — HYDROCODONE-ACETAMINOPHEN 5-325 MG PO TABS: 1.0000 | ORAL_TABLET | Freq: Four times a day (QID) | ORAL | 0 refills | Status: DC | PRN

## 2021-07-01 MED ORDER — PREDNISONE 10 MG PO TABS: ORAL_TABLET | ORAL | 0 refills | Status: DC

## 2021-07-01 NOTE — Progress Notes (Signed)
Office Visit Note   Patient: Marcus Fuller           Date of Birth: 1941-09-13           MRN: [id number] Visit Date: 07/01/2021 Requested by: Kristen Loader, [place] [address] [place],  [place] [address] PCP: Kristen Loader, FNP  Subjective: Chief Complaint  Patient presents with   Right Thigh - Pain    Referred by Dr. Jola Baptist. Was doing floor exercises  for his back 2 weeks ago - pain and weakness in the hamstring after one of the exercises. Has Parkinson's disease. Uses rolling walker with ambulation at times, but has used it all the time since this pain started. Cannot lift the right leg.    HPI: He is here with right posterior hip and thigh pain.  He is seen at the request of Dr. Jola Baptist.  About 2 weeks ago, he was lying on his back on the floor and his wife was helping him do some gentle leg exercises.  Patient has Parkinson's and has been losing his ability to walk, he has been much less active than before so she was trying to help him maintain his muscle strength.  With his hips flexed and knees flexed, he was gently pushing his feet against her hands.  At one point he felt some pain in the right posterior hip and thigh so he stopped that activity.  He seemed to be okay the rest of the day, but the next day he had a great deal of pain when trying to stand and walk.  This has continued to today.  At rest, his pain is minimal.  But whenever he stands to walk, the pain is pretty intense.  He has been struggling with low back pain for a while.  He was being treated at [facility] and had 2 injections in his spine per Dr. Nelva Bush, unfortunately they did not help.  He has had x-rays and even MRI scan at one point, but we do not have access to those results.  X-rays were fairly recently but MRI scan was a while ago.  Since then he has had a pacemaker implanted.  He is on Eliquis for anticoagulation.  For a while now patient has complained of a tingling sensation in his  feet and may sometimes feel hot or cold to him, when physically they are not a different temperature.  He has been seeing Dr. Jimmye Norman since April and it has helped with his pain overall.  With this new pain, the treatments are making as much difference so Dr. Jimmye Norman referred him for evaluation.                ROS:   Patient is not diabetic.  All other systems were reviewed and are negative.  Objective: Vital Signs: There were no vitals taken for this visit.  Physical Exam:  General:  Alert and oriented, in no acute distress. Pulm:  Breathing unlabored. Psy:  Normal mood, congruent affect.  Right leg: He has tenderness in the sciatic notch.  He is tender just lateral to the ischial tuberosity.  The hamstring itself is not tender to palpation and he has no pain with isometric flexion of his knee against resistance.  He does have pain when I passively extend his knee.  His lower extremity strength is adequate.   Imaging: No results found.  Assessment & Plan: Acute right posterior hip and thigh pain, suspicious for a lumbar  disc protrusion with sciatica.  I do not think he injured his hamstring. -Discussed options with patient and his wife, elected to treat with a prednisone Dosepak and hydrocodone for more severe pain.  I will contact his cardiologist to determine whether patient could have an MRI scan versus CT myelogram.  Depending on those results, could consider epidural steroid injection.  He is on Eliquis, so this will need to be taken into consideration.     Procedures: No procedures performed        PMFS History: Patient Active Problem List   Diagnosis Date Noted   Persistent atrial fibrillation ([place]) 04/06/2021   Secondary hypercoagulable state ([place]) 04/06/2021   Paroxysmal atrial fibrillation ([place]) 03/23/2020   Chronic low back pain 07/03/2019   NSVT (nonsustained ventricular tachycardia) ([place]) 05/15/2019   Increased thyroid stimulating hormone (TSH) level  06/22/2018   Chronic diastolic heart failure ([place]) 79/01/4096   Chronic systolic heart failure ([place]) 06/20/2018   Status post laparoscopic cholecystectomy 05/21/2018   Atherosclerosis of aorta ([place]) 05/11/2018   Coronary artery disease involving coronary bypass graft of native heart without angina pectoris 05/11/2018   Coronary artery disease involving native coronary artery of native heart without angina pectoris 02/11/2018   Biventricular cardiac pacemaker in situ 02/11/2018   Mixed hyperlipidemia 02/11/2018   Tremor of right hand 02/11/2018   Parkinson's disease ([place]) 01/29/2018   Cataract    Past Medical History:  Diagnosis Date   Anxiety    Cataract    beginning stages   Chronic low back pain 07/03/2019   Coronary artery disease    stent- 2007    History of kidney stones    Hypertension    Parkinson's disease ([place]) 01/29/2018   Presence of permanent cardiac pacemaker    2017    Tremor of right hand     Family History  Problem Relation Age of Onset   Heart disease Mother    Diabetes Mother    Cancer Mother        breast   Heart disease Father    Heart attack Father    Atrial fibrillation Sister    Heart disease Brother    Pancreatic disease Brother    Tremor Brother    Heart attack Paternal Grandfather    Spina bifida Brother     Past Surgical History:  Procedure Laterality Date   APPENDECTOMY     bbb     CARDIOVERSION N/A 04/19/2021   Procedure: CARDIOVERSION;  Surgeon: Lelon Perla, MD;  Location: [facility];  Service: Cardiovascular;  Laterality: N/A;   CHOLECYSTECTOMY N/A 05/21/2018   Procedure: LAPAROSCOPIC CHOLECYSTECTOMY;  Surgeon: Coralie Keens, MD;  Location: [facility];  Service: General;  Laterality: N/A;   left knee arthroscopy   1996   PACEMAKER INSERTION  11/2016   STENT PLACEMENT VASCULAR ([place] [facility]X)  2007   TONSILLECTOMY     Social History   Occupational History   Not on file  Tobacco Use   Smoking status: Never   Smokeless tobacco:  Never  Vaping Use   Vaping Use: Never used  Substance and Sexual Activity   Alcohol use: Yes    Comment: social    Drug use: No   Sexual activity: Not Currently

## 2021-07-02 ENCOUNTER — Other Ambulatory Visit: Payer: Self-pay | Admitting: Physician Assistant

## 2021-07-02 ENCOUNTER — Encounter: Payer: Self-pay | Admitting: Family Medicine

## 2021-07-02 NOTE — Addendum Note (Signed)
Addended by: Hortencia Pilar on: 07/02/2021 08:02 AM   Modules accepted: Orders

## 2021-07-14 DIAGNOSIS — M5116 Intervertebral disc disorders with radiculopathy, lumbar region: Secondary | ICD-10-CM | POA: Diagnosis not present

## 2021-07-14 DIAGNOSIS — M25551 Pain in right hip: Secondary | ICD-10-CM | POA: Diagnosis not present

## 2021-07-14 DIAGNOSIS — M9905 Segmental and somatic dysfunction of pelvic region: Secondary | ICD-10-CM | POA: Diagnosis not present

## 2021-07-14 DIAGNOSIS — M9903 Segmental and somatic dysfunction of lumbar region: Secondary | ICD-10-CM | POA: Diagnosis not present

## 2021-07-22 ENCOUNTER — Other Ambulatory Visit: Payer: Self-pay | Admitting: Physician Assistant

## 2021-08-03 ENCOUNTER — Ambulatory Visit (HOSPITAL_COMMUNITY)
Admission: RE | Admit: 2021-08-03 | Discharge: 2021-08-03 | Disposition: A | Discharge status: Home / Self Care | Payer: Medicare Other | Source: Ambulatory Visit | Attending: Family Medicine | Admitting: Family Medicine

## 2021-08-03 ENCOUNTER — Other Ambulatory Visit: Payer: Self-pay

## 2021-08-03 ENCOUNTER — Encounter (HOSPITAL_COMMUNITY): Payer: Self-pay

## 2021-08-03 DIAGNOSIS — M545 Low back pain, unspecified: Secondary | ICD-10-CM | POA: Diagnosis not present

## 2021-08-03 DIAGNOSIS — M79604 Pain in right leg: Secondary | ICD-10-CM

## 2021-08-03 DIAGNOSIS — Z950 Presence of cardiac pacemaker: Secondary | ICD-10-CM | POA: Diagnosis not present

## 2021-08-03 IMAGING — MR MR LUMBAR SPINE W/O CM
4 of 5 series · 17 of 48 positions shown · non-contrast
Comparison: CT lumbar spine [DATE]

CLINICAL DATA: Low back pain with right hip and leg pain

EXAM:
MRI LUMBAR SPINE WITHOUT CONTRAST
TECHNIQUE: Multiplanar, multisequence MR imaging of the lumbar spine was
performed. No intravenous contrast was administered.

[Series 3: T2 · sagittal · 4.0mm · 0.55mm/px · 6 of 16 slices shown (1 of 2)]
[im 1/16]
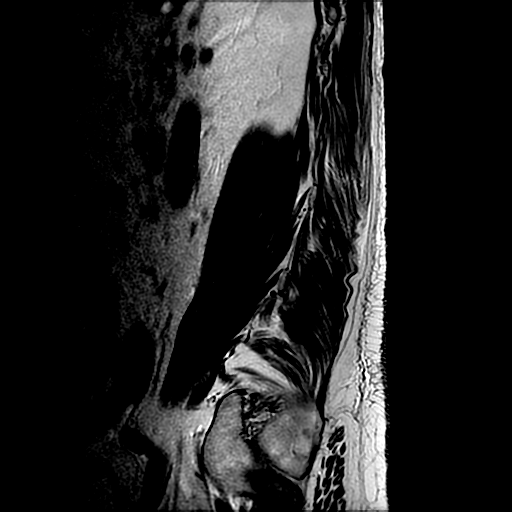
[im 4/16]
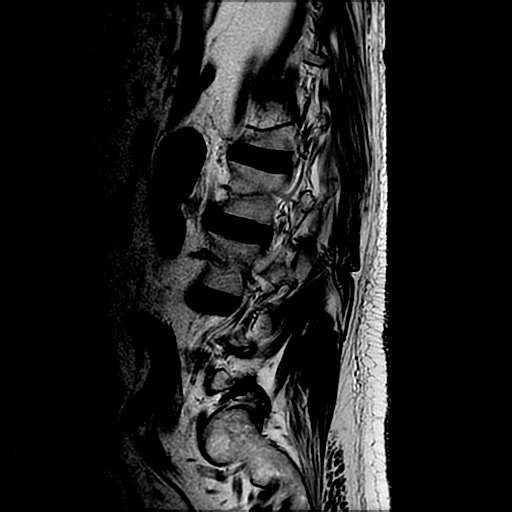
[im 7/16]
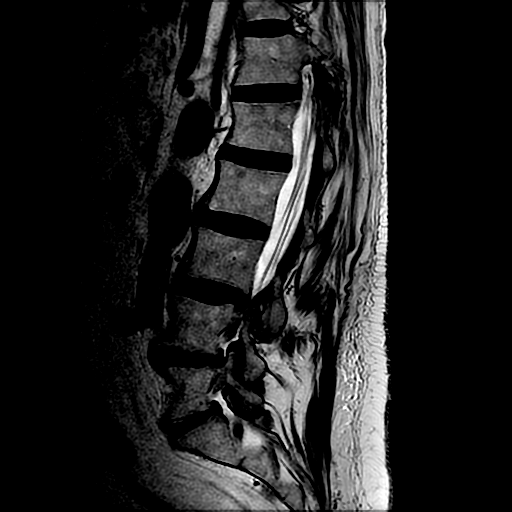
[im 10/16]
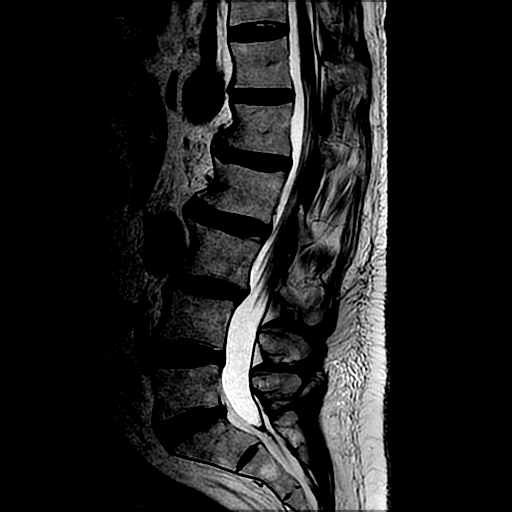
[im 13/16]
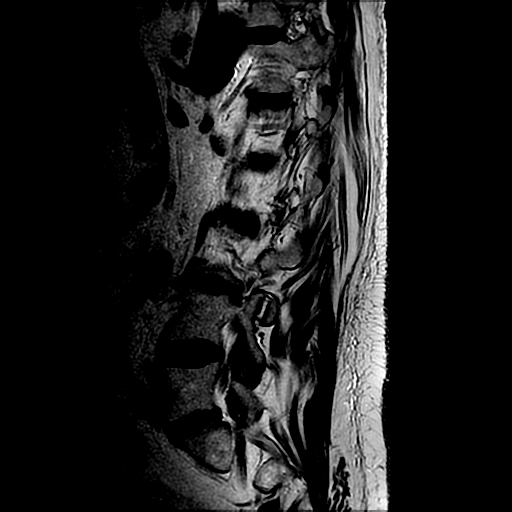
[im 16/16]
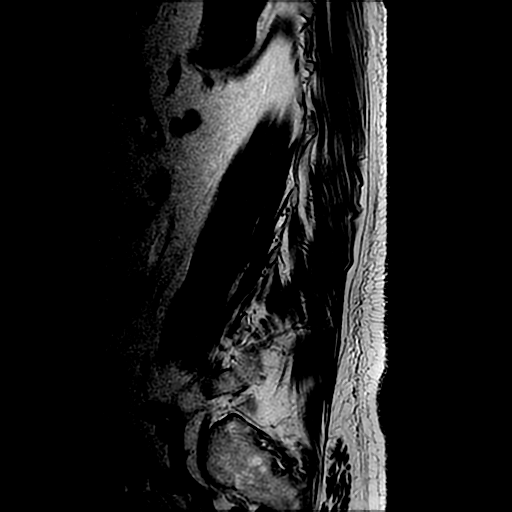

[Series 5: T1 · sagittal · 4.0mm · 0.55mm/px · 3 of 16 slices shown (1 of 2)]
[im 4/16]
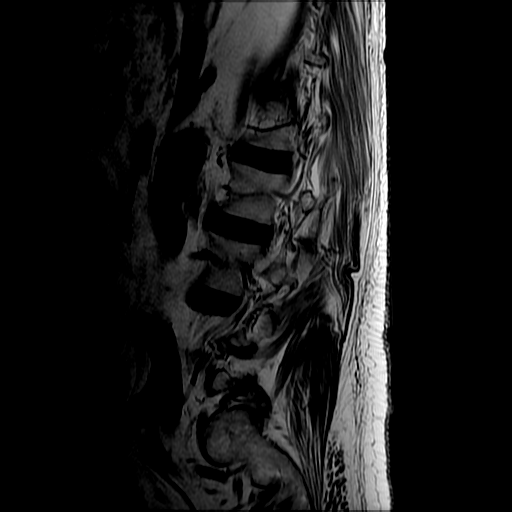
[im 10/16]
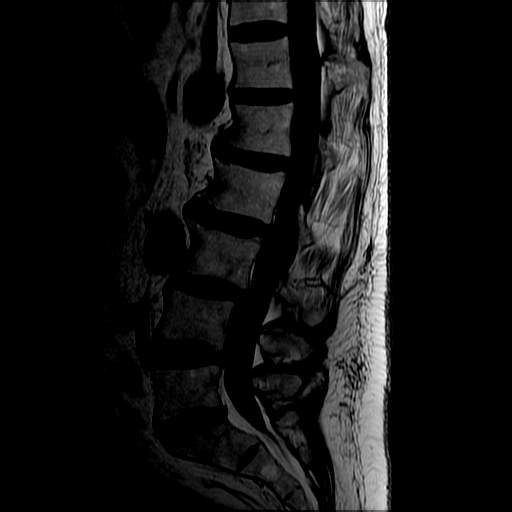
[im 16/16]
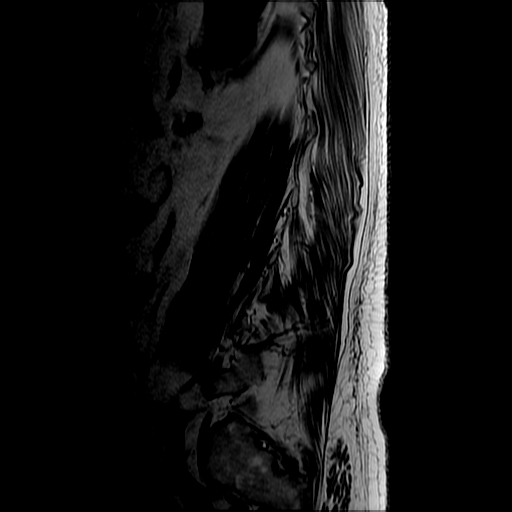

[Series 6: T2 · axial · 4.0mm · 0.39mm/px · z∈[-102,+84]mm · 5 of 40 slices shown (2 of 2)]
[im 1/40]
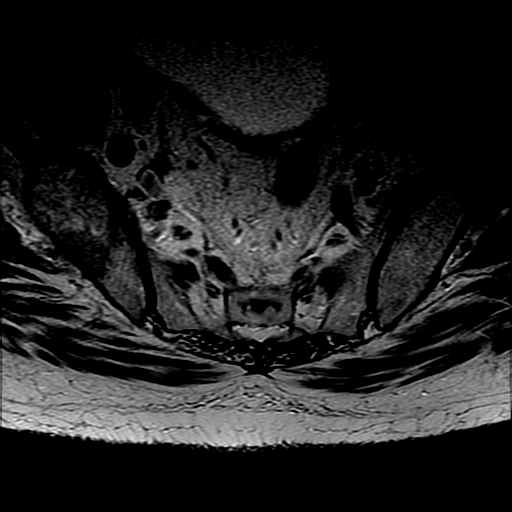
[im 6/40]
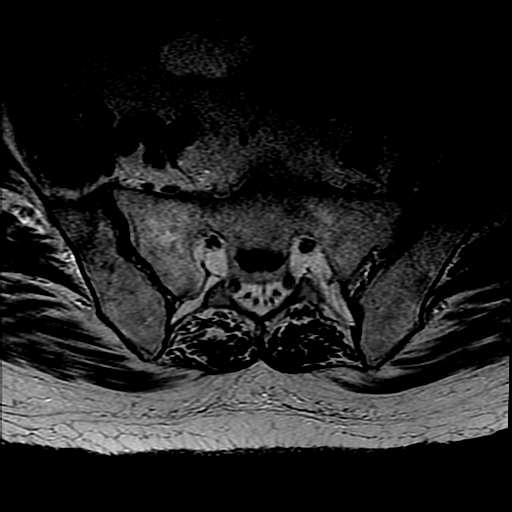
[im 12/40]
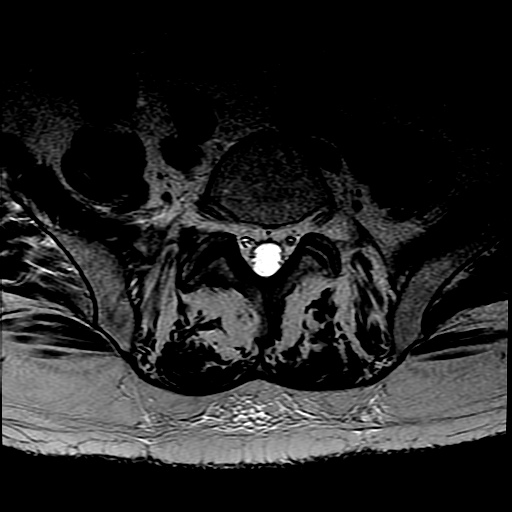
[im 20/40]
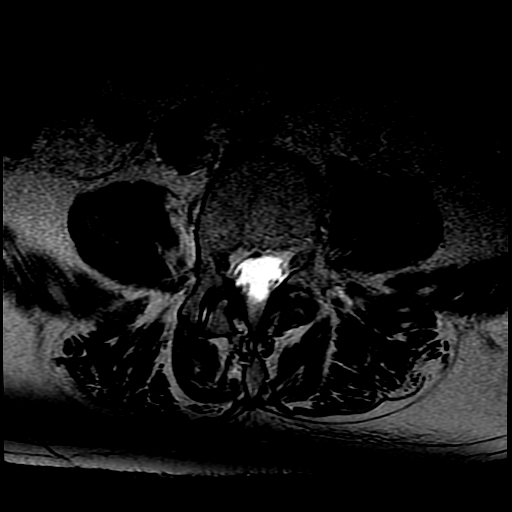
[im 34/40]
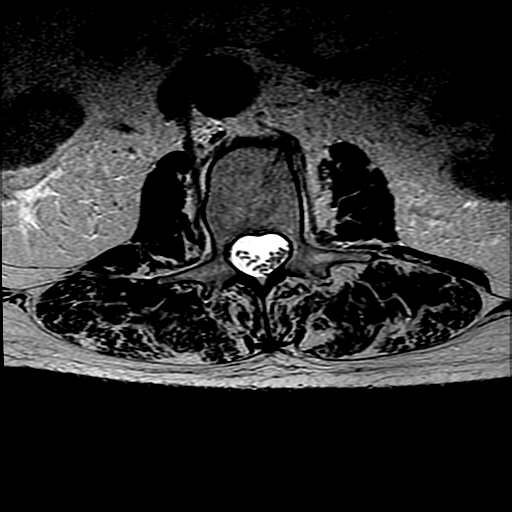

[Series 7: T1 · axial · 4.0mm · 0.39mm/px · z∈[-77,+84]mm · 3 of 40 slices shown (2 of 2)]
[im 6/40]
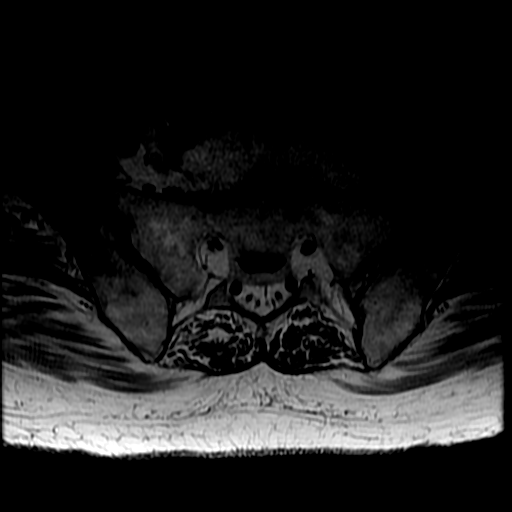
[im 20/40]
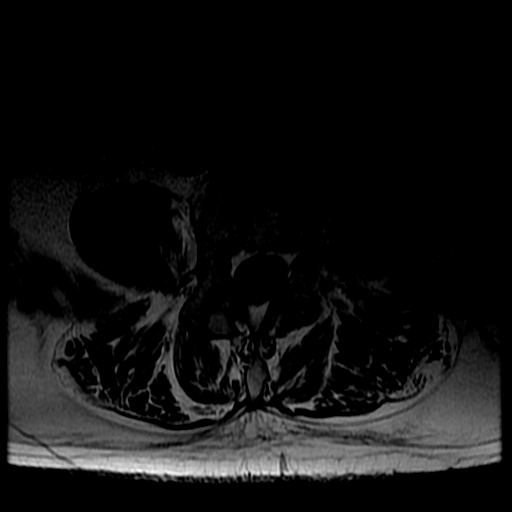
[im 34/40]
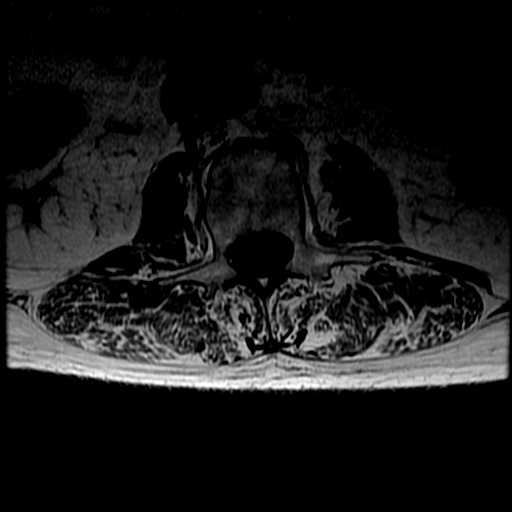

[17 of 48 positions shown; findings below may reference images not displayed]

FINDINGS: Segmentation:  Standard.

Alignment: There is mild dextroscoliosis of the lumbar spine. There
is grade 1 retrolisthesis of L3 on L4, similar to the prior CT.
Alignment otherwise normal.

Vertebrae: Vertebral body heights are preserved. There are two small
foci of T1 hypointensity in the L2 and L3 vertebral bodies measuring
up to 0.9 cm. There is multilevel facet arthropathy, most advanced
at L3-L4 through L5-S1. There is a trace left effusion at L3-L4.

Conus medullaris and cauda equina: Conus extends to the T12-L1
level. Conus and cauda equina appear normal.

Paraspinal and other soft tissues: The paraspinal soft tissues are
unremarkable. A right renal cyst is noted.

Disc levels:

There is multilevel disc desiccation with mild loss of height. Bulky
anterior osteophytes are noted at L1-L2 and L2-L3.

T12-L1: No significant spinal canal or neural foraminal stenosis.

L1-L2: No significant spinal canal or neural foraminal stenosis.

L2-L3: No significant spinal canal or neural foraminal stenosis.

L3-L4: There is a mild disc bulge, ligamentum flavum thickening, and
bilateral facet arthropathy resulting in slight crowding of the
right subarticular zone without evidence of nerve root impingement
and mild right worse than left neural foraminal stenosis.

L4-L5: There is a mild disc bulge, ligamentum flavum thickening, and
bilateral facet arthropathy resulting in crowding of the right
subarticular zone without evidence of nerve root impingement and
moderate right and no significant left neural foraminal stenosis.

L5-S1: There is a mild disc bulge and bilateral facet arthropathy
resulting in mild right and no significant left neural foraminal
stenosis and no significant spinal canal stenosis.

Other: There is an expansile T1 hypointense lesion in the right
iliac wing measuring up to 5.1 cm x 3.2 cm, incompletely imaged.
This lesion was not present on the prior CT lumbar spine of
[DATE]

There is trace free fluid in the abdomen, nonspecific.
IMPRESSION: 1. Expansile lesion in the right iliac bone is concerning for
malignancy, particularly metastatic disease; correlate with any
history of malignancy. If there is no history of malignancy,
oncologic workup and dedicated MRI of the hip with and without
contrast are recommended for further evaluation.
2. Two small foci of signal abnormality in the L2 and L3 vertebral
bodies are suspicious for additional small metastases. MRI of the
lumbar spine with and without contrast may be helpful for better
characterization.
3. Mild multilevel degenerative changes of the lumbar spine detailed
above resulting in up to moderate right neural foraminal stenosis at
L4-L5. Otherwise, no high-grade spinal canal or neural foraminal
stenosis.
4. Trace free fluid in the abdomen, nonspecific.

These results were called by telephone at the time of interpretation
on [DATE] at [DATE] to provider SUNDBERG , who verbally
acknowledged these results.

## 2021-08-03 NOTE — Progress Notes (Signed)
Patient here today at [facility] for MRI lumbar spine wo contrast. Patient has medtronic device. Carelink express sent to [facility] and Renee-cardiology PA. Orders received for VOO 85. Will reprogram once scan is complete.

## 2021-08-04 ENCOUNTER — Other Ambulatory Visit: Payer: Self-pay | Admitting: Family Medicine

## 2021-08-04 ENCOUNTER — Ambulatory Visit: Payer: Medicare Other | Admitting: Neurology

## 2021-08-04 ENCOUNTER — Encounter: Payer: Self-pay | Admitting: Neurology

## 2021-08-04 ENCOUNTER — Telehealth: Payer: Self-pay | Admitting: Family Medicine

## 2021-08-04 VITALS — BP 115/73 | HR 76 | Wt 200.0 lb

## 2021-08-04 DIAGNOSIS — G8929 Other chronic pain: Secondary | ICD-10-CM | POA: Diagnosis not present

## 2021-08-04 DIAGNOSIS — G20 Parkinson's disease: Secondary | ICD-10-CM

## 2021-08-04 DIAGNOSIS — Z950 Presence of cardiac pacemaker: Secondary | ICD-10-CM

## 2021-08-04 DIAGNOSIS — M545 Low back pain, unspecified: Secondary | ICD-10-CM

## 2021-08-04 DIAGNOSIS — M79604 Pain in right leg: Secondary | ICD-10-CM

## 2021-08-04 DIAGNOSIS — R1900 Intra-abdominal and pelvic swelling, mass and lump, unspecified site: Secondary | ICD-10-CM

## 2021-08-04 MED ORDER — GABAPENTIN 600 MG PO TABS: ORAL_TABLET | ORAL | 3 refills | Status: DC

## 2021-08-04 NOTE — Patient Instructions (Signed)
Reduce the gabapentin 600 mg tablet to 1/2 tablet in the morning and evening and one at night

## 2021-08-04 NOTE — Progress Notes (Signed)
Reason for visit: Parkinson's disease, chronic low back pain  Marcus Fuller is an 80 y.o. male  History of present illness:  Mr. Marcus Fuller is a 80 year old right-handed white male with a history of Parkinson's disease with symptoms on the right greater than left side.  The patient also has a lot of low back pain and more recently with pain down the right leg to the knee that is worse with weightbearing.  He just recently had MRI of the lumbar spine, the formal report is not yet available, but by my reading there is no evidence of any lumbar spinal stenosis.  The patient has fallen on occasion, he is now using a walker.  At night, he may feel as if his feet are cold.  He is quite sleepy throughout the day, he is on Sinemet taking 25/250 mg tablets, 1 tablet 3 times daily and gabapentin 600 mg 3 times daily.  He oftentimes will forget to take the midday doses of these medications.  He returns for an evaluation.  He has not had physical therapy in greater than 2 years.  Past Medical History:  Diagnosis Date   Anxiety    Cataract    beginning stages   Chronic low back pain 07/03/2019   Coronary artery disease    stent- 2007    History of kidney stones    Hypertension    Parkinson's disease ([place]) 01/29/2018   Presence of permanent cardiac pacemaker    2017    Tremor of right hand     Past Surgical History:  Procedure Laterality Date   APPENDECTOMY     bbb     CARDIOVERSION N/A 04/19/2021   Procedure: CARDIOVERSION;  Surgeon: Lelon Perla, MD;  Location: [facility];  Service: Cardiovascular;  Laterality: N/A;   CHOLECYSTECTOMY N/A 05/21/2018   Procedure: LAPAROSCOPIC CHOLECYSTECTOMY;  Surgeon: Coralie Keens, MD;  Location: [facility];  Service: General;  Laterality: N/A;   left knee arthroscopy   [place]  11/2016   STENT PLACEMENT VASCULAR ([place] HX)  2007   TONSILLECTOMY      Family History  Problem Relation Age of Onset   Heart disease Mother    Diabetes  Mother    Cancer Mother        breast   Heart disease Father    Heart attack Father    Atrial fibrillation Sister    Heart disease Brother    Pancreatic disease Brother    Tremor Brother    Heart attack Paternal Grandfather    Spina bifida Brother     Social history:  reports that he has never smoked. He has never used smokeless tobacco. He reports current alcohol use. He reports that he does not use drugs.    Allergies  Allergen Reactions   Codeine Itching    Medications:  Prior to Admission medications   Medication Sig Start Date End Date Taking? Authorizing Provider  acetaminophen (TYLENOL) 325 MG tablet Take 650 mg by mouth every 6 (six) hours as needed.   Yes [provider]  carbidopa-levodopa (SINEMET) 25-250 MG tablet Take 1 tablet by mouth 3 (three) times daily. 03/04/21  Yes Kathrynn Ducking, MD  digoxin (LANOXIN) 0.125 MG tablet TAKE 1 TABLET BY MOUTH EVERY DAY 03/30/21  Yes Croitoru, Mihai, MD  ELIQUIS 5 MG TABS tablet TAKE 1 TABLET BY MOUTH TWICE A DAY 04/07/21  Yes Croitoru, Mihai, MD  gabapentin (NEURONTIN) 600 MG tablet Take 1 tablet (  600 mg total) by mouth 3 (three) times daily. 03/04/21  Yes Kathrynn Ducking, MD  HYDROcodone-acetaminophen (NORCO/VICODIN) 5-325 MG tablet Take 1 tablet by mouth every 6 (six) hours as needed for moderate pain. 07/01/21  Yes Hilts, Legrand Como, MD  Melatonin 10 MG TABS Take 10 mg by mouth at bedtime.   Yes [provider]  Naproxen Sodium 220 MG CAPS  06/17/21  Yes [provider]  OVER THE COUNTER MEDICATION Apply 1 application topically at bedtime. Outback Relief   Yes [provider]  methocarbamol (ROBAXIN) 500 MG tablet Take 500 mg by mouth every 8 (eight) hours. Patient not taking: Reported on 08/04/2021 05/11/21   [provider]  traMADol (ULTRAM) 50 MG tablet Take 1 tablet (50 mg total) by mouth every 6 (six) hours as needed. Patient not taking: Reported on 08/04/2021 04/28/18   Petrucelli,  Aldona Bar R, PA-C    ROS:  Out of a complete 14 system review of symptoms, the patient complains only of the following symptoms, and all other reviewed systems are negative.  Low back pain, right leg pain Walking difficulty, falls Daytime drowsiness  Blood pressure 115/73, pulse 76, weight 200 lb (90.7 kg).  Physical Exam  General: The patient is alert and cooperative at the time of the examination.  Skin: No significant peripheral edema is noted.   Neurologic Exam  Mental status: The patient is alert and oriented x 3 at the time of the examination. The patient has apparent normal recent and remote memory, with an apparently normal attention span and concentration ability.   Cranial nerves: Facial symmetry is present. Speech is normal, no aphasia or dysarthria is noted. Extraocular movements are full. Visual fields are full.  Masking the face is seen.  Motor: The patient has good strength in all 4 extremities.  Sensory examination: Soft touch sensation is symmetric on the face, arms, and legs.  Coordination: The patient has good finger-nose-finger and heel-to-shin bilaterally.  Resting tremors are noted on the right greater than left upper extremities.  Gait and station: The patient has some difficulty arising from a seated position.  Once up, he walks with a walker.  Gait is slow and deliberate, knees are flexed with walking, patient has more difficulty mobilizing with the right leg than the left.  Reflexes: Deep tendon reflexes are symmetric.   Assessment/Plan:  1.  Parkinson's disease  2.  Gait disorder  3.  Low back pain, right leg pain  The patient is having pain in the back down the right leg to the knee which could be a facet joint arthritis issue, we will pursue nerve conduction studies on both legs and EMG on the right leg.  The back pain is limiting his ability to remain active during the day.  He is falling on occasion, we will get physical therapy.  For now,  we will keep his Sinemet dose the same, reduce the gabapentin dose to 300 mg twice during the day and 600 mg at night.  He does not believe that the gabapentin has helped his discomfort.  He will follow-up here in 4 months, in the future he can be followed through Dr. Rexene Alberts.  Jill Alexanders MD 08/04/2021 1:34 PM  [facility] [address] [place], [place] [address]  Phone [phone] Fax [phone]

## 2021-08-04 NOTE — Telephone Encounter (Signed)
I spoke to the patient about his lumbar MRI scan.  I talked to the radiologist as well.  MRI is concerning for metastatic disease in the right iliac bone.  There are also 2 small foci of signal abnormality in L2 and L3 vertebral bodies which are suspicious for additional small metastases.  I will make urgent referral to oncology for further work-up and treatment.

## 2021-08-06 ENCOUNTER — Telehealth: Payer: Self-pay | Admitting: Family Medicine

## 2021-08-06 NOTE — Telephone Encounter (Signed)
Patient's wife called. Says they have not heard anything from [facility]. Her call back number is [phone]

## 2021-08-06 NOTE — Telephone Encounter (Signed)
I think the appointment they are waiting on is the oncology appointment. It looks like neurology has been scheduled. Could you check on this or give a phone number?

## 2021-08-07 ENCOUNTER — Encounter: Payer: Self-pay | Admitting: Family Medicine

## 2021-08-09 ENCOUNTER — Other Ambulatory Visit: Payer: Self-pay | Admitting: Family Medicine

## 2021-08-09 ENCOUNTER — Other Ambulatory Visit: Payer: Self-pay | Admitting: Physician Assistant

## 2021-08-09 MED ORDER — HYDROCODONE-ACETAMINOPHEN 5-325 MG PO TABS: 1.0000 | ORAL_TABLET | Freq: Four times a day (QID) | ORAL | 0 refills | Status: DC | PRN

## 2021-08-09 NOTE — Telephone Encounter (Signed)
I literally just sent message to patient

## 2021-08-16 ENCOUNTER — Ambulatory Visit (INDEPENDENT_AMBULATORY_CARE_PROVIDER_SITE_OTHER): Payer: Medicare Other | Admitting: Cardiovascular Disease

## 2021-08-16 ENCOUNTER — Encounter: Payer: Self-pay | Admitting: Cardiovascular Disease

## 2021-08-16 ENCOUNTER — Other Ambulatory Visit: Payer: Self-pay

## 2021-08-16 VITALS — BP 130/88 | HR 76 | Ht 70.0 in | Wt 195.8 lb

## 2021-08-16 DIAGNOSIS — I4819 Other persistent atrial fibrillation: Secondary | ICD-10-CM | POA: Diagnosis not present

## 2021-08-16 DIAGNOSIS — I5032 Chronic diastolic (congestive) heart failure: Secondary | ICD-10-CM

## 2021-08-16 DIAGNOSIS — E785 Hyperlipidemia, unspecified: Secondary | ICD-10-CM | POA: Diagnosis not present

## 2021-08-16 DIAGNOSIS — R1900 Intra-abdominal and pelvic swelling, mass and lump, unspecified site: Secondary | ICD-10-CM

## 2021-08-16 DIAGNOSIS — I2510 Atherosclerotic heart disease of native coronary artery without angina pectoris: Secondary | ICD-10-CM | POA: Diagnosis not present

## 2021-08-16 DIAGNOSIS — I700 Atherosclerosis of aorta: Secondary | ICD-10-CM

## 2021-08-16 DIAGNOSIS — Z950 Presence of cardiac pacemaker: Secondary | ICD-10-CM | POA: Diagnosis not present

## 2021-08-16 DIAGNOSIS — G20 Parkinson's disease: Secondary | ICD-10-CM

## 2021-08-16 DIAGNOSIS — R600 Localized edema: Secondary | ICD-10-CM | POA: Diagnosis not present

## 2021-08-16 NOTE — Patient Instructions (Signed)
Medication Instructions:  No Changes In Medications at this time.  *If you need a refill on your cardiac medications before your next appointment, please call your pharmacy*  Follow-Up: At [facility], you and your health needs are our priority.  As part of our continuing mission to provide you with exceptional heart care, we have created designated Provider Care Teams.  These Care Teams include your primary Cardiologist (physician) and Advanced Practice Providers (APPs -  Physician Assistants and Nurse Practitioners) who all work together to provide you with the care you need, when you need it.  Your next appointment:   1 year(s)  The format for your next appointment:   In Person  Provider:   Sanda Klein, MD  Other Instructions PLEASE FILL OUT PATIENT ASSISTANCE APPLICATION AND RETURN TO OFFICE AND WE WILL DO OUR PORTION AND FAX IT FOR YOU.

## 2021-08-16 NOTE — Progress Notes (Signed)
Cardiology Office Note:    Date:  08/19/2021   ID:  [name], DOB [birth year], MRN [id number]  PCP:  Kristen Loader, FNP  Cardiologist:  Gillie Crisci  Referring MD: Kristen Loader, FNP   Chief Complaint  Patient presents with   Leg Swelling   CHF, CRT-P   History of Present Illness:    Marcus Fuller is a 80 y.o. male with a remote history of coronary artery disease and stent placement (2007), paroxysmal atrial fibrillation, dual-chamber biventricular pacemaker implanted in November 2017 (Medtronic Percepta CRT-P), low normal left ventricular systolic function (EF 46-56% echo March 2019), LBBB, Parkinson's disease.  Recently has been troubled by swelling limited to his left leg.  Left leg is not tender or red and the skin is not tight.  He does not have dyspnea on exertion (limited by Parkinson's syndrome), orthopnea, PND or chest pain.  He has been having pain on the lateral surface and back of his right thigh and was diagnosed with a 5.1 x 3.2 cm tumor in the pelvis on MRI in the right iliac wing.  Also has a couple of small foci of abnormality in the second and third lumbar vertebrae, all suspicious for metastatic disease.  He is scheduled to see Dr. Junius Roads and Dr. Irene Limbo in the cancer center.  Pacemaker interrogation shows normal device function.  Estimated longevity of his Medtronic [employer] device is 1.6 years.  He has 95.8% effective biventricular pacing.  He has been in persistent atrial fibrillation since Apr 26, 2021.  (He underwent cardioversion on May 2, but only remained in normal rhythm for about 5 or 6 days).  He did not notice any change in his stamina or breathing during that time.  OptiVol has remained in normal range.  The plan is for him to be treated as longstanding persistent atrial fibrillation with rate control, rather than put him on amiodarone or other antiarrhythmic medications with side effects.  He is only taking a low-dose of digoxin due to his tendency to  have orthostatic hypotension, but this is providing excellent rate control.  His ECG shows sinus rhythm with biventricular pacing and a distinct positive R wave in lead V1.  There is 1 premature atrial contraction that seems to be conducted with left bundle branch block pattern.  QTc is 450 ms.  He has not had any serious falls or injuries, but bruises very easily.  [name] had a first  episode of atrial fibrillation roughly 6 years ago that resolved spontaneously during ER evaluation.  His pacemaker then showed no evidence of atrial fibrillation for the ensuing 4-5 years.  He had a 2-hour episode of paroxysmal atrial fibrillation in September 2020 that was asymptomatic.  Anticoagulation was offered but declined at that time, with a plan to reviewed the indication if he would have new events.  In December 2020 he had a relatively mild COVID-19 illness but developed atrial fibrillation.  He subsequently developed several back-to-back episodes of paroxysmal atrial fibrillation, lasting a cumulative 6 or 7 days, with the longest individual episode over 1-1/2 days duration.  The rate was always well controlled.  In May 2022 he underwent cardioversion for a lengthy episode of atrial fibrillation, but went back to atrial fibrillation after only about a week in normal rhythm (without any improvement in symptoms)  ECG with the ventricular pacing turned off shows left bundle branch block with a duration of about 150 ms.   Past Medical History:  Diagnosis Date  Anxiety    Cataract    beginning stages   Chronic low back pain 07/03/2019   Coronary artery disease    stent- 2007    History of kidney stones    Hypertension    Parkinson's disease ([place]) 01/29/2018   Presence of permanent cardiac pacemaker    2017    Tremor of right hand     Past Surgical History:  Procedure Laterality Date   APPENDECTOMY     bbb     CARDIOVERSION N/A 04/19/2021   Procedure: CARDIOVERSION;  Surgeon: Lelon Perla, MD;   Location: [facility] ENDOSCOPY;  Service: Cardiovascular;  Laterality: N/A;   CHOLECYSTECTOMY N/A 05/21/2018   Procedure: LAPAROSCOPIC CHOLECYSTECTOMY;  Surgeon: Coralie Keens, MD;  Location: [facility] ORS;  Service: General;  Laterality: N/A;   left knee arthroscopy   1996   PACEMAKER INSERTION  11/2016   STENT PLACEMENT VASCULAR ([facility])  2007   TONSILLECTOMY      Current Medications: Current Meds  Medication Sig   acetaminophen (TYLENOL) 325 MG tablet Take 650 mg by mouth every 6 (six) hours as needed.   carbidopa-levodopa (SINEMET) 25-250 MG tablet Take 1 tablet by mouth 3 (three) times daily.   digoxin (LANOXIN) 0.125 MG tablet TAKE 1 TABLET BY MOUTH EVERY DAY   ELIQUIS 5 MG TABS tablet TAKE 1 TABLET BY MOUTH TWICE A DAY   gabapentin (NEURONTIN) 600 MG tablet 1/2 tablet in the morning and midday, 1 at night   Melatonin 10 MG TABS Take 10 mg by mouth at bedtime.   OVER THE COUNTER MEDICATION Apply 1 application topically at bedtime. [place] Relief   [DISCONTINUED] HYDROcodone-acetaminophen (NORCO/VICODIN) 5-325 MG tablet Take 1 tablet by mouth every 6 (six) hours as needed for moderate pain.     Allergies:   Codeine   Social History   Socioeconomic History   Marital status: Married    Spouse name: Cecelia   Number of children: 2   Years of education: 14   Highest education level: Not on file  Occupational History   Not on file  Tobacco Use   Smoking status: Never   Smokeless tobacco: Never  Vaping Use   Vaping Use: Never used  Substance and Sexual Activity   Alcohol use: Yes    Comment: social    Drug use: No   Sexual activity: Not Currently  Other Topics Concern   Not on file  Social History Narrative   Lives w/ wife   Caffeine use: sometimes   Right handed    Social Determinants of Health   Financial Resource Strain: Not on file  Food Insecurity: Not on file  Transportation Needs: Not on file  Physical Activity: Not on file  Stress: Not on file  Social Connections:  Not on file     Family History: The patient's family history includes Atrial fibrillation in his sister; Cancer in his mother; Diabetes in his mother; Heart attack in his father and paternal grandfather; Heart disease in his brother, father, and mother; Pancreatic disease in his brother; Spina bifida in his brother; Tremor in his brother.  Reports that his father died of a heart attack at age 26.  ROS:   Please see the history of present illness.    All other systems are reviewed and are negative.  EKGs/Labs/Other Studies Reviewed:    The following studies were reviewed today: In person comprehensive pacemaker check performed today, with device reprogramming  EKG:  EKG is ordered today and shows biventricular  pacing with prominent positive R waves in leads V1-V2, background atrial fibrillation.  Intracardiac electrogram shows atrial fibrillation with biventricular pacing  Recent Labs: 08/19/2021: ALT 12; BUN 13; Creatinine 0.86; Hemoglobin 15.8; Platelets 166; Potassium 4.2; Sodium 142  Recent Lipid Panel    Component Value Date/Time   CHOL 209 (H) 12/28/2018 1245   TRIG 233 (H) 12/28/2018 1245   HDL 37 (L) 12/28/2018 1245   CHOLHDL 5.6 (H) 12/28/2018 1245   LDLCALC 125 (H) 12/28/2018 1245   02/13/2020 Total cholesterol 98, HDL 31, triglycerides 84, LDL 50  02/16/2021 cholesterol 153, HDL 30, LDL 81, triglycerides 248  Physical Exam:    VS:  BP 130/88 (BP Location: Right Arm, Patient Position: Sitting, Cuff Size: Normal)   Pulse 76   Ht 5\' 10"  (1.778 m)   Wt 195 lb 12.8 oz (88.8 kg)   SpO2 97%   BMI 28.09 kg/m     Wt Readings from Last 3 Encounters:  08/19/21 197 lb 8 oz (89.6 kg)  08/16/21 195 lb 12.8 oz (88.8 kg)  08/04/21 200 lb (90.7 kg)     General: Alert, oriented x3, no distress, limited facial expression to Parkinson's disease.  Prominent resting tremor.  Healthy pacemaker site Head: no evidence of trauma, PERRL, EOMI, no exophtalmos or lid lag, no myxedema, no  xanthelasma; normal ears, nose and oropharynx Neck: normal jugular venous pulsations and no hepatojugular reflux; brisk carotid pulses without delay and no carotid bruits Chest: clear to auscultation, no signs of consolidation by percussion or palpation, normal fremitus, symmetrical and full respiratory excursions Cardiovascular: normal position and quality of the apical impulse, regular rhythm, normal first and second heart sounds, no murmurs, rubs or gallops Abdomen: no tenderness or distention, no masses by palpation, no abnormal pulsatility or arterial bruits, normal bowel sounds, no hepatosplenomegaly Extremities: no clubbing, cyanosis or edema; 2+ radial, ulnar and brachial pulses bilaterally; 2+ right femoral, posterior tibial and dorsalis pedis pulses; 2+ left femoral, posterior tibial and dorsalis pedis pulses; no subclavian or femoral bruits Neurological: grossly nonfocal except for signs of parkinsonism/tremor Psych: Normal mood and affect   ASSESSMENT:    1. Persistent atrial fibrillation ([place])   2. Chronic diastolic heart failure ([place])   3. Coronary artery disease involving native coronary artery of native heart without angina pectoris   4. Aortic atherosclerosis ([place])   5. Biventricular cardiac pacemaker in situ   6. Dyslipidemia (high LDL; low HDL)   7. Parkinson's disease ([facility])   8. Pelvic mass   9. Leg edema, left     PLAN:    In order of problems listed above:  AFib: This has not been associated with worsening heart failure.  Had fairly early recurrence of atrial fibrillation after cardioversion and while in sinus rhythm did not have any improvement in his overall symptomatology.  He continues to have good effective biventricular pacing, almost 96%.  Only on digoxin for rate control, avoiding medicines that could worsen orthostatic hypotension of Parkinson's disease.  He is compliant with anticoagulation.  CHA2DS2-VASc 4 (age 15, heart failure, CAD).  Low threshold to  discontinue his digoxin in the future. CHF: Considered clinically and by OptiVol monitoring despite not receiving treatment with diuretics.  Left ventricular systolic function after implementation of CRT is borderline low.  Digoxin is his only "heart failure medication" and is likely not necessary in the long run. CAD: Has not had angina pectoris in about 15 years.  He reportedly received a stent at [facility] in 2007.  There is evidence of substantial coronary atherosclerosis and atherosclerotic calcification of the aorta on the CT scan that was performed for his cholelithiasis.  On aspirin and statin.  His LDL used to be in target range until this year when is slightly above desirable at 81. Ao atherosclerosis: Well documented on imaging studies, aorta normal in caliber.  No evidence of aneurysm or dissection. CRT-P: Roughly 96% effective biventricular pacing albeit with a relatively high LV lead pacing threshold.  Had complete normalization of left ventricular systolic function after implementation of resynchronization pacing. HLP: Low HDL.  LDL is now slightly above target.  He is on a highly active statin.  There is room to increase the dose, but at this time his focus is on the suspected diagnosis of metastatic cancer. Parkinson's disease: This is his major functional limitation and has a much larger impact on his quality of life than his cardiovascular issues. Tumor of the right iliac wing and lumbar spine: Suspicious for metastatic disease of the bone.  Consider prostate as the source in view of his age.  Appointment in the cancer center on September 1. Left leg edema: There is indeed some asymmetrical swelling of the left leg, but the skin is not tight, the calf is not tender and there is no erythema.  He is on an active dose of anticoagulant.  The suspicion for DVT remains, although less likely since he is compliant with Eliquis and maximum dose.  There was no evidence of DVT on the MRI of the lumbar  spine and pelvis performed in August 16.  External venous compression or involvement of the lymphatic system should be considered   Medication Adjustments/Labs and Tests Ordered: Current medicines are reviewed at length with the patient today.  Concerns regarding medicines are outlined above.  Orders Placed This Encounter  Procedures   EKG 12-Lead    No orders of the defined types were placed in this encounter.   Signed, Sanda Klein, MD  08/19/2021 4:04 PM    [facility]

## 2021-08-19 ENCOUNTER — Other Ambulatory Visit: Payer: Self-pay

## 2021-08-19 ENCOUNTER — Inpatient Hospital Stay: Payer: Medicare Other | Attending: Hematology | Admitting: Hematology

## 2021-08-19 ENCOUNTER — Inpatient Hospital Stay: Payer: Medicare Other

## 2021-08-19 ENCOUNTER — Encounter: Payer: Medicare Other | Admitting: Diagnostic Neuroimaging

## 2021-08-19 VITALS — BP 101/64 | HR 55 | Temp 97.7°F | Resp 18 | Wt 197.5 lb

## 2021-08-19 DIAGNOSIS — J90 Pleural effusion, not elsewhere classified: Secondary | ICD-10-CM | POA: Diagnosis not present

## 2021-08-19 DIAGNOSIS — R531 Weakness: Secondary | ICD-10-CM | POA: Diagnosis not present

## 2021-08-19 DIAGNOSIS — Z8269 Family history of other diseases of the musculoskeletal system and connective tissue: Secondary | ICD-10-CM | POA: Insufficient documentation

## 2021-08-19 DIAGNOSIS — Z803 Family history of malignant neoplasm of breast: Secondary | ICD-10-CM | POA: Insufficient documentation

## 2021-08-19 DIAGNOSIS — D329 Benign neoplasm of meninges, unspecified: Secondary | ICD-10-CM | POA: Insufficient documentation

## 2021-08-19 DIAGNOSIS — M48061 Spinal stenosis, lumbar region without neurogenic claudication: Secondary | ICD-10-CM | POA: Diagnosis not present

## 2021-08-19 DIAGNOSIS — Z885 Allergy status to narcotic agent status: Secondary | ICD-10-CM | POA: Insufficient documentation

## 2021-08-19 DIAGNOSIS — M47816 Spondylosis without myelopathy or radiculopathy, lumbar region: Secondary | ICD-10-CM | POA: Diagnosis not present

## 2021-08-19 DIAGNOSIS — C3411 Malignant neoplasm of upper lobe, right bronchus or lung: Secondary | ICD-10-CM | POA: Diagnosis not present

## 2021-08-19 DIAGNOSIS — K4020 Bilateral inguinal hernia, without obstruction or gangrene, not specified as recurrent: Secondary | ICD-10-CM | POA: Insufficient documentation

## 2021-08-19 DIAGNOSIS — M25559 Pain in unspecified hip: Secondary | ICD-10-CM | POA: Insufficient documentation

## 2021-08-19 DIAGNOSIS — I119 Hypertensive heart disease without heart failure: Secondary | ICD-10-CM | POA: Diagnosis not present

## 2021-08-19 DIAGNOSIS — K449 Diaphragmatic hernia without obstruction or gangrene: Secondary | ICD-10-CM | POA: Diagnosis not present

## 2021-08-19 DIAGNOSIS — C7951 Secondary malignant neoplasm of bone: Secondary | ICD-10-CM

## 2021-08-19 DIAGNOSIS — M8580 Other specified disorders of bone density and structure, unspecified site: Secondary | ICD-10-CM | POA: Insufficient documentation

## 2021-08-19 DIAGNOSIS — N400 Enlarged prostate without lower urinary tract symptoms: Secondary | ICD-10-CM | POA: Insufficient documentation

## 2021-08-19 DIAGNOSIS — Z833 Family history of diabetes mellitus: Secondary | ICD-10-CM | POA: Insufficient documentation

## 2021-08-19 DIAGNOSIS — G893 Neoplasm related pain (acute) (chronic): Secondary | ICD-10-CM | POA: Diagnosis not present

## 2021-08-19 DIAGNOSIS — N200 Calculus of kidney: Secondary | ICD-10-CM | POA: Insufficient documentation

## 2021-08-19 DIAGNOSIS — E785 Hyperlipidemia, unspecified: Secondary | ICD-10-CM | POA: Diagnosis not present

## 2021-08-19 DIAGNOSIS — N281 Cyst of kidney, acquired: Secondary | ICD-10-CM | POA: Diagnosis not present

## 2021-08-19 DIAGNOSIS — Z87442 Personal history of urinary calculi: Secondary | ICD-10-CM | POA: Insufficient documentation

## 2021-08-19 DIAGNOSIS — C7931 Secondary malignant neoplasm of brain: Secondary | ICD-10-CM | POA: Insufficient documentation

## 2021-08-19 DIAGNOSIS — R609 Edema, unspecified: Secondary | ICD-10-CM | POA: Insufficient documentation

## 2021-08-19 DIAGNOSIS — G319 Degenerative disease of nervous system, unspecified: Secondary | ICD-10-CM | POA: Insufficient documentation

## 2021-08-19 DIAGNOSIS — I10 Essential (primary) hypertension: Secondary | ICD-10-CM | POA: Insufficient documentation

## 2021-08-19 DIAGNOSIS — I482 Chronic atrial fibrillation, unspecified: Secondary | ICD-10-CM | POA: Insufficient documentation

## 2021-08-19 DIAGNOSIS — G20 Parkinson's disease: Secondary | ICD-10-CM | POA: Insufficient documentation

## 2021-08-19 DIAGNOSIS — N210 Calculus in bladder: Secondary | ICD-10-CM | POA: Insufficient documentation

## 2021-08-19 DIAGNOSIS — Z7901 Long term (current) use of anticoagulants: Secondary | ICD-10-CM | POA: Insufficient documentation

## 2021-08-19 DIAGNOSIS — Z9049 Acquired absence of other specified parts of digestive tract: Secondary | ICD-10-CM | POA: Insufficient documentation

## 2021-08-19 DIAGNOSIS — M79651 Pain in right thigh: Secondary | ICD-10-CM | POA: Diagnosis not present

## 2021-08-19 DIAGNOSIS — I6782 Cerebral ischemia: Secondary | ICD-10-CM | POA: Insufficient documentation

## 2021-08-19 DIAGNOSIS — C801 Malignant (primary) neoplasm, unspecified: Secondary | ICD-10-CM | POA: Insufficient documentation

## 2021-08-19 DIAGNOSIS — I700 Atherosclerosis of aorta: Secondary | ICD-10-CM | POA: Diagnosis not present

## 2021-08-19 DIAGNOSIS — Z79899 Other long term (current) drug therapy: Secondary | ICD-10-CM | POA: Insufficient documentation

## 2021-08-19 DIAGNOSIS — Z8249 Family history of ischemic heart disease and other diseases of the circulatory system: Secondary | ICD-10-CM | POA: Insufficient documentation

## 2021-08-19 DIAGNOSIS — Z7952 Long term (current) use of systemic steroids: Secondary | ICD-10-CM | POA: Insufficient documentation

## 2021-08-19 DIAGNOSIS — I2510 Atherosclerotic heart disease of native coronary artery without angina pectoris: Secondary | ICD-10-CM | POA: Diagnosis not present

## 2021-08-19 DIAGNOSIS — Z8379 Family history of other diseases of the digestive system: Secondary | ICD-10-CM | POA: Insufficient documentation

## 2021-08-19 LAB — LACTATE DEHYDROGENASE: LDH: 176 U/L (ref 98–192)

## 2021-08-19 LAB — CMP (CANCER CENTER ONLY)
ALT: 12 U/L (ref 0–44)
AST: 30 U/L (ref 15–41)
Albumin: 3.6 g/dL (ref 3.5–5.0)
Alkaline Phosphatase: 184 U/L — ABNORMAL HIGH (ref 38–126)
Anion gap: 10 (ref 5–15)
BUN: 13 mg/dL (ref 8–23)
CO2: 25 mmol/L (ref 22–32)
Calcium: 10.1 mg/dL (ref 8.9–10.3)
Chloride: 107 mmol/L (ref 98–111)
Creatinine: 0.86 mg/dL (ref 0.61–1.24)
GFR, Estimated: >60 mL/min (ref >60)
Glucose, Bld: 110 mg/dL — ABNORMAL HIGH (ref 70–99)
Potassium: 4.2 mmol/L (ref 3.5–5.1)
Sodium: 142 mmol/L (ref 135–145)
Total Bilirubin: 1.1 mg/dL (ref 0.3–1.2)
Total Protein: 6.4 g/dL — ABNORMAL LOW (ref 6.5–8.1)

## 2021-08-19 LAB — CBC WITH DIFFERENTIAL/PLATELET
Abs Immature Granulocytes: 0.08 10*3/uL — ABNORMAL HIGH (ref 0.00–0.07)
Basophils Absolute: 0.1 10*3/uL (ref 0.0–0.1)
Basophils Relative: 1 %
Eosinophils Absolute: 0.1 10*3/uL (ref 0.0–0.5)
Eosinophils Relative: 1 %
HCT: 48 % (ref 39.0–52.0)
Hemoglobin: 15.8 g/dL (ref 13.0–17.0)
Immature Granulocytes: 1 %
Lymphocytes Relative: 18 %
Lymphs Abs: 1.7 10*3/uL (ref 0.7–4.0)
MCH: 28.1 pg (ref 26.0–34.0)
MCHC: 32.9 g/dL (ref 30.0–36.0)
MCV: 85.3 fL (ref 80.0–100.0)
Monocytes Absolute: 0.7 10*3/uL (ref 0.1–1.0)
Monocytes Relative: 8 %
Neutro Abs: 6.7 10*3/uL (ref 1.7–7.7)
Neutrophils Relative %: 71 %
Platelets: 166 10*3/uL (ref 150–400)
RBC: 5.63 MIL/uL (ref 4.22–5.81)
RDW: 15 % (ref 11.5–15.5)
WBC: 9.4 10*3/uL (ref 4.0–10.5)
nRBC: 0 % (ref 0.0–0.2)

## 2021-08-19 LAB — VITAMIN D 25 HYDROXY (VIT D DEFICIENCY, FRACTURES): Vit D, 25-Hydroxy: 30.04 ng/mL (ref 30–100)

## 2021-08-19 MED ORDER — HYDROMORPHONE HCL 2 MG PO TABS: 1.0000 mg | ORAL_TABLET | ORAL | 0 refills | Status: DC | PRN

## 2021-08-19 MED ORDER — SENNOSIDES-DOCUSATE SODIUM 8.6-50 MG PO TABS: 2.0000 | ORAL_TABLET | Freq: Every day | ORAL | 1 refills | Status: DC

## 2021-08-19 NOTE — Patient Instructions (Signed)
Thank you for choosing [facility] to provide your care.   Should you have questions after your visit to the [facility] ([facility]), please contact this office at [phone] between 8:30 AM and 4:30 PM.  Voice mails left after 4:00 PM may not be returned until the following business day.  Calls received after 4:30 PM will be answered by an off-site Nurse Triage Line.    Prescription Refills:  Please have your pharmacy contact us directly for most prescription requests.  Contact the office directly for refills of narcotics (pain medications). Allow 48-72 hours for refills.  Appointments: Please contact the [facility] scheduling department [phone] for questions regarding [facility] appointment scheduling.  Contact the schedulers with any scheduling changes so that your appointment can be rescheduled in a timely manner.   Central Scheduling for [place] [phone] - Call to schedule procedures such as PET scans, CT scans, MRI, Ultrasound, etc.  To afford each patient quality time with our providers, please arrive 30 minutes before your scheduled appointment time.  If you arrive late for your appointment, you may be asked to reschedule.  We strive to give you quality time with our providers, and arriving late affects you and other patients whose appointments are after yours. If you are a no show for multiple scheduled visits, you may be dismissed from the clinic at the providers discretion.     Resources: [facility] Social Workers [phone] for additional information on assistance programs or assistance connecting with community support programs   [place] DSS  [phone]: Information regarding food stamps, Medicaid, and utility assistance [place] [employer] [place] [phone]   [place] [employer]'s shared-ride transportation service for eligible riders who have a disability that prevents them from riding the fixed route bus.   Medicare Rights Center 800-333-4114  Helps people with Medicare understand their rights and benefits, navigate the Medicare system, and secure the quality healthcare they deserve [employer] 800-227-2345 Assists patients locate various types of support and financial assistance Cancer Care: 1-800-813-HOPE (4673) Provides financial assistance, online support groups, medication/co-pay assistance.   Transportation Assistance for appointments at [facility]: Transportation Coordinator [phone]  Again, thank you for choosing [facility] for your care.       

## 2021-08-19 NOTE — Progress Notes (Signed)
Marland Kitchen   HEMATOLOGY/ONCOLOGY CONSULTATION NOTE  Date of Service: 08/19/2021  Patient Care Team: Kristen Loader, FNP as PCP - General (Family Medicine) Croitoru, Dani Gobble, MD as PCP - Cardiology (Cardiology) Jola Baptist, DC as Referring Physician (Chiropractic Medicine)  CHIEF COMPLAINTS/PURPOSE OF CONSULTATION:  Concerns for bone metastases/pelvic mass  HISTORY OF PRESENTING ILLNESS:   Marcus Fuller is a wonderful 80 y.o. male who has been referred to [name] by Dr Eunice Blase for evaluation and management of newly noted pelvic mass/bone metastases.  Patient has a history of hypertension, Parkinson's disease [follows with Dr. Juanda Crumble Willis], coronary artery disease status post PCI in 2007, history of kidney stones, presence of permanent cardiac pacemaker who has history of chronic low back pain. Patient notes that over the last couple of months he has had increasing low back pain had an MRI of the lumbar spine without contrast ordered by Dr. Junius Roads on 08/03/2021.  MRI lumbar spine showed  1. Expansile lesion in the right iliac bone is concerning for malignancy, particularly metastatic disease; correlate with any history of malignancy. If there is no history of malignancy, oncologic workup and dedicated MRI of the hip with and without contrast are recommended for further evaluation. 2. Two small foci of signal abnormality in the L2 and L3 vertebral bodies are suspicious for additional small metastases. MRI of the lumbar spine with and without contrast may be helpful for better characterization. 3. Mild multilevel degenerative changes of the lumbar spine detailed above resulting in up to moderate right neural foraminal stenosis at L4-L5. Otherwise, no high-grade spinal canal or neural foraminal stenosis. 4. Trace free fluid in the abdomen, nonspecific.  Patient notes some pain on the right posterior hip and upper thigh.  No pain radiating into his lower extremities.  No lower extremity  weakness that is new.  No loss of bowel or bladder control.  Patient does note chronic weakness decreased mobility due to has Parkinson's disease.  He is on Sinemet for this.  He notes that at baseline he uses a rolling walker but has had difficulty lifting his right leg due to pain.  He has had chronic low back pain in the past and has been seen by Dr. Herma Mering at [facility] and has previously received 2 injections in his spine for pain management.  He also has a history of chronic atrial fibrillation anticoagulation and digoxin.  Patient notes no significant weight loss in the last 3 months. . Wt Readings from Last 3 Encounters:  08/19/21 197 lb 8 oz (89.6 kg)  08/16/21 195 lb 12.8 oz (88.8 kg)  08/04/21 200 lb (90.7 kg)   No other focal neurological deficits no headaches. No shortness of breath or new chest pain. No change in urinary or bowel habits. No previous history of any cancers.  MEDICAL HISTORY:  Past Medical History:  Diagnosis Date   Anxiety    Cataract    beginning stages   Chronic low back pain 07/03/2019   Coronary artery disease    stent- 2007    History of kidney stones    Hypertension    Parkinson's disease ([place]) 01/29/2018   Presence of permanent cardiac pacemaker    2017    Tremor of right hand     SURGICAL HISTORY: Past Surgical History:  Procedure Laterality Date   APPENDECTOMY     bbb     CARDIOVERSION N/A 04/19/2021   Procedure: CARDIOVERSION;  Surgeon: Lelon Perla, MD;  Location: [facility];  Service: Cardiovascular;  Laterality: N/A;   CHOLECYSTECTOMY N/A 05/21/2018   Procedure: LAPAROSCOPIC CHOLECYSTECTOMY;  Surgeon: Coralie Keens, MD;  Location: [facility];  Service: General;  Laterality: N/A;   left knee arthroscopy   1996   PACEMAKER INSERTION  11/2016   STENT PLACEMENT VASCULAR ([place] [facility])  2007   TONSILLECTOMY      SOCIAL HISTORY: Social History   Socioeconomic History   Marital status: Married    Spouse name: Cecelia   Number  of children: 2   Years of education: 14   Highest education level: Not on file  Occupational History   Not on file  Tobacco Use   Smoking status: Never   Smokeless tobacco: Never  Vaping Use   Vaping Use: Never used  Substance and Sexual Activity   Alcohol use: Yes    Comment: social    Drug use: No   Sexual activity: Not Currently  Other Topics Concern   Not on file  Social History Narrative   Lives w/ wife   Caffeine use: sometimes   Right handed    Social Determinants of Health   Financial Resource Strain: Not on file  Food Insecurity: Not on file  Transportation Needs: Not on file  Physical Activity: Not on file  Stress: Not on file  Social Connections: Not on file  Intimate Partner Violence: Not on file    FAMILY HISTORY: Family History  Problem Relation Age of Onset   Heart disease Mother    Diabetes Mother    Cancer Mother        breast   Heart disease Father    Heart attack Father    Atrial fibrillation Sister    Heart disease Brother    Pancreatic disease Brother    Tremor Brother    Heart attack Paternal Grandfather    Spina bifida Brother     ALLERGIES:  is allergic to codeine.  MEDICATIONS:  Current Outpatient Medications  Medication Sig Dispense Refill   acetaminophen (TYLENOL) 325 MG tablet Take 650 mg by mouth every 6 (six) hours as needed.     carbidopa-levodopa (SINEMET) 25-250 MG tablet Take 1 tablet by mouth 3 (three) times daily. 270 tablet 3   digoxin (LANOXIN) 0.125 MG tablet TAKE 1 TABLET BY MOUTH EVERY DAY 90 tablet 3   ELIQUIS 5 MG TABS tablet TAKE 1 TABLET BY MOUTH TWICE A DAY 60 tablet 5   gabapentin (NEURONTIN) 600 MG tablet 1/2 tablet in the morning and midday, 1 at night 180 tablet 3   HYDROcodone-acetaminophen (NORCO/VICODIN) 5-325 MG tablet Take 1 tablet by mouth every 6 (six) hours as needed for moderate pain. 20 tablet 0   Melatonin 10 MG TABS Take 10 mg by mouth at bedtime.     methocarbamol (ROBAXIN) 500 MG tablet Take  500 mg by mouth every 8 (eight) hours.     Naproxen Sodium 220 MG CAPS  (Patient not taking: Reported on 08/16/2021)     OVER THE COUNTER MEDICATION Apply 1 application topically at bedtime. Outback Relief     traMADol (ULTRAM) 50 MG tablet Take 1 tablet (50 mg total) by mouth every 6 (six) hours as needed. 15 tablet 0   No current facility-administered medications for this visit.    REVIEW OF SYSTEMS:    10 Point review of Systems was done is negative except as noted above.  PHYSICAL EXAMINATION: ECOG PERFORMANCE STATUS: 3 - Symptomatic, >50% confined to bed  . Vitals:   08/19/21 1155  BP: 101/64  Pulse: (!) 55  Resp: 18  Temp: 97.7 F (36.5 C)  SpO2: 96%   Filed Weights   08/19/21 1155  Weight: 197 lb 8 oz (89.6 kg)   .Body mass index is 28.34 kg/m.  GENERAL:alert, in no acute distress and comfortable SKIN: no acute rashes, no significant lesions EYES: conjunctiva are pink and non-injected, sclera anicteric OROPHARYNX: MMM, no exudates, no oropharyngeal erythema or ulceration NECK: supple, no JVD LYMPH:  no palpable lymphadenopathy in the cervical, axillary or inguinal regions LUNGS: clear to auscultation b/l with normal respiratory effort HEART: regular rate & rhythm ABDOMEN:  normoactive bowel sounds , non tender, not distended. Extremity: trace pedal edema b/l PSYCH: alert & oriented x 3 with fluent speech NEURO: no focal motor/sensory deficits, resting tremor in hands  LABORATORY DATA:  I have reviewed the data as listed  . CBC Latest Ref Rng & Units 04/06/2021 12/28/2018 06/20/2018  WBC 4.0 - 10.5 K/uL 9.0 9.5 9.6  Hemoglobin 13.0 - 17.0 g/dL 16.0 15.5 15.7  Hematocrit 39.0 - 52.0 % 50.7 46.3 47.8  Platelets 150 - 400 K/uL 161 165 179    . CMP Latest Ref Rng & Units 04/06/2021 07/06/2020 12/28/2018  Glucose 70 - 99 mg/dL 133(H) 87 85  BUN 8 - 23 mg/dL '9 16 15  ' Creatinine 0.61 - 1.24 mg/dL 0.99 0.99 1.00  Sodium 135 - 145 mmol/L 140 141 141  Potassium 3.5 -  5.1 mmol/L 4.1 4.5 4.2  Chloride 98 - 111 mmol/L 105 103 101  CO2 22 - 32 mmol/L '29 23 22  ' Calcium 8.9 - 10.3 mg/dL 9.8 9.7 10.5(H)  Total Protein 6.0 - 8.5 g/dL - - 6.6  Total Bilirubin 0.0 - 1.2 mg/dL - - 0.7  Alkaline Phos 39 - 117 IU/L - - 173(H)  AST 0 - 40 IU/L - - 39  ALT 0 - 44 IU/L - - 42     RADIOGRAPHIC STUDIES: I have personally reviewed the radiological images as listed and agreed with the findings in the report. MR LUMBAR SPINE WO CONTRAST  Result Date: 08/04/2021 CLINICAL DATA:  Low back pain with right hip and leg pain EXAM: MRI LUMBAR SPINE WITHOUT CONTRAST TECHNIQUE: Multiplanar, multisequence MR imaging of the lumbar spine was performed. No intravenous contrast was administered. COMPARISON:  CT lumbar spine 08/03/2018 FINDINGS: Segmentation:  Standard. Alignment: There is mild dextroscoliosis of the lumbar spine. There is grade 1 retrolisthesis of L3 on L4, similar to the prior CT. Alignment otherwise normal. Vertebrae: Vertebral body heights are preserved. There are two small foci of T1 hypointensity in the L2 and L3 vertebral bodies measuring up to 0.9 cm. There is multilevel facet arthropathy, most advanced at L3-L4 through L5-S1. There is a trace left effusion at L3-L4. Conus medullaris and cauda equina: Conus extends to the T12-L1 level. Conus and cauda equina appear normal. Paraspinal and other soft tissues: The paraspinal soft tissues are unremarkable. A right renal cyst is noted. Disc levels: There is multilevel disc desiccation with mild loss of height. Bulky anterior osteophytes are noted at L1-L2 and L2-L3. T12-L1: No significant spinal canal or neural foraminal stenosis. L1-L2: No significant spinal canal or neural foraminal stenosis. L2-L3: No significant spinal canal or neural foraminal stenosis. L3-L4: There is a mild disc bulge, ligamentum flavum thickening, and bilateral facet arthropathy resulting in slight crowding of the right subarticular zone without evidence  of nerve root impingement and mild right worse than left neural foraminal stenosis. L4-L5: There is a mild disc  bulge, ligamentum flavum thickening, and bilateral facet arthropathy resulting in crowding of the right subarticular zone without evidence of nerve root impingement and moderate right and no significant left neural foraminal stenosis. L5-S1: There is a mild disc bulge and bilateral facet arthropathy resulting in mild right and no significant left neural foraminal stenosis and no significant spinal canal stenosis. Other: There is an expansile T1 hypointense lesion in the right iliac wing measuring up to 5.1 cm x 3.2 cm, incompletely imaged. This lesion was not present on the prior CT lumbar spine of 08/03/2018 There is trace free fluid in the abdomen, nonspecific. IMPRESSION: 1. Expansile lesion in the right iliac bone is concerning for malignancy, particularly metastatic disease; correlate with any history of malignancy. If there is no history of malignancy, oncologic workup and dedicated MRI of the hip with and without contrast are recommended for further evaluation. 2. Two small foci of signal abnormality in the L2 and L3 vertebral bodies are suspicious for additional small metastases. MRI of the lumbar spine with and without contrast may be helpful for better characterization. 3. Mild multilevel degenerative changes of the lumbar spine detailed above resulting in up to moderate right neural foraminal stenosis at L4-L5. Otherwise, no high-grade spinal canal or neural foraminal stenosis. 4. Trace free fluid in the abdomen, nonspecific. These results were called by telephone at the time of interpretation on 08/04/2021 at 1:44 pm to provider MICHAEL HILTS , who verbally acknowledged these results. Electronically Signed   By: Valetta Mole M.D.   On: 08/04/2021 13:44    ASSESSMENT & PLAN:   80 year old male with history of Parkinson's disease, coronary artery disease, atrial fibrillation on anticoagulation  with  1) right iliac bone expansile mass concerning for bone metastases vs myeloma vs primary bone tumor Neoplasm related pain is in his right posterior hip and upper thigh 2) L2 and L3 lesions concerning for bone metastases. 3) history of Parkinson's disease on Sinemet follows with Dr. Margette Fast 4) coronary artery disease status post PCI 2007 atrial fibrillation status post pacemaker placement on anticoagulation and digoxin follows with Dr. Sallyanne Kuster 5) hypertension 6) dyslipidemia PLAN -I discussed his available lab results and MRI of the lumbar spine in detail with the patient and his accompanying family member. -We discussed the diagnostic possibilities. -Labs were ordered for further evaluation as noted below including PSA and myeloma work-up -Whole-body PET CT scan to further evaluate the extent of his bone metastases and to evaluate for possible primary site for bone metastases. -CT-guided biopsy of the right iliac bone lesion requested. -Okay to hold Eliquis for 48 hours prior to biopsy and restart soon after no issues with bleeding. -Dilaudid as needed ordered for pain which is limiting his ambulation.  Senna S ordered for bowel prophylaxis. Follow-up  Labs today PET/CT in 5 days CT biopsy for core needle biopsy of rt iliac large lesion MD visit in 10 days . Orders Placed This Encounter  Procedures   NM PET Image Initial (PI) Whole Body    Standing Status:   Future    Standing Expiration Date:   08/19/2022    Order Specific Question:   If indicated for the ordered procedure, I authorize the administration of a radiopharmaceutical per Radiology protocol    Answer:   Yes    Order Specific Question:   Preferred imaging location?    Answer:   [facility]   CT Biopsy    Standing Status:   Future    Standing Expiration Date:  08/19/2022    Order Specific Question:   Lab orders requested (DO NOT place separate lab orders, these will be automatically ordered during procedure  specimen collection):    Answer:   Surgical Pathology    Order Specific Question:   Lab orders requested (DO NOT place separate lab orders, these will be automatically ordered during procedure specimen collection):    Answer:   Other    Order Specific Question:   Reason for Exam (SYMPTOM  OR DIAGNOSIS REQUIRED)    Answer:   Biopsy of large right iliac bone mass-suspected for myeloma versus bone metastases.  Patient is on Eliquis for atrial fibrillation okay to hold for 48 hours prior to the biopsy if necessary.    Comments:   Flow cytometry and molecular studies as indicated.    Order Specific Question:   Preferred location?    Answer:   [facility]   CBC with Differential/Platelet    Standing Status:   Future    Number of Occurrences:   1    Standing Expiration Date:   08/19/2022   CMP ([facility] only)    Standing Status:   Future    Number of Occurrences:   1    Standing Expiration Date:   08/19/2022   Prostate-Specific AG, Serum    Standing Status:   Future    Number of Occurrences:   1    Standing Expiration Date:   08/19/2022   Lactate dehydrogenase    Standing Status:   Future    Number of Occurrences:   1    Standing Expiration Date:   08/19/2022   Multiple Myeloma Panel (SPEP&IFE w/QIG)    Standing Status:   Future    Number of Occurrences:   1    Standing Expiration Date:   08/19/2022   Kappa/lambda light chains    Standing Status:   Future    Number of Occurrences:   1    Standing Expiration Date:   08/19/2022   Beta 2 microglobulin, serum    Standing Status:   Future    Number of Occurrences:   1    Standing Expiration Date:   08/19/2022   Vitamin D 25 hydroxy    Standing Status:   Future    Number of Occurrences:   1    Standing Expiration Date:   08/19/2022     All of the patients questions were answered with apparent satisfaction. The patient knows to call the clinic with any problems, questions or concerns.  I spent 60 minutes counseling the patient face to  face. The total time spent in the appointment was 80 minutes and more than 50% was on counseling and direct patient cares.    Sullivan Lone MD [place] [facility] Hematology/Oncology Physician [facility]  (Office):       [phone] (Work cell):  [phone] (Fax):           [phone]  08/19/2021 12:08 PM

## 2021-08-20 ENCOUNTER — Telehealth: Payer: Self-pay | Admitting: Hematology

## 2021-08-20 ENCOUNTER — Telehealth: Payer: Self-pay | Admitting: *Deleted

## 2021-08-20 ENCOUNTER — Encounter (HOSPITAL_COMMUNITY): Payer: Self-pay | Admitting: Radiology

## 2021-08-20 LAB — KAPPA/LAMBDA LIGHT CHAINS
Kappa free light chain: 27.8 mg/L — ABNORMAL HIGH (ref 3.3–19.4)
Kappa, lambda light chain ratio: 1.68 — ABNORMAL HIGH (ref 0.26–1.65)
Lambda free light chains: 16.5 mg/L (ref 5.7–26.3)

## 2021-08-20 LAB — BETA 2 MICROGLOBULIN, SERUM: Beta-2 Microglobulin: 1.9 mg/L (ref 0.6–2.4)

## 2021-08-20 LAB — PROSTATE-SPECIFIC AG, SERUM (LABCORP): Prostate Specific Ag, Serum: 1.9 ng/mL (ref 0.0–4.0)

## 2021-08-20 NOTE — Telephone Encounter (Signed)
Scheduled follow-up appointment per 9/1 los. Patient's wife is aware.

## 2021-08-20 NOTE — Progress Notes (Signed)
Patient Name  Sterling, Mondo Legal Sex  Male DOB  1941-10-09 SSN  [id number] Address  [place] [address] Phone  [phone] [facility])  [phone] (Mobile) *Preferred*    RE: CT Biopsy Received: Duaine Dredge, MD  Arlyn Leak   CT core biopsy R iliac bone lesion + flow/molecular   DDH        Previous Messages   ----- Message -----  From: Garth Bigness D  Sent: 08/19/2021   2:27 PM EDT  To: Valli Glance, Ir Procedure Requests  Subject: CT Biopsy                                       Procedure:    CT Biopsy   Reason:  Bone metastases, Biopsy of large right iliac bone mass-suspected for myeloma versus bone metastases. Patient is on Eliquis for atrial fibrillation okay to hold for 48 hours prior to the biopsy if necessary.   Labs requested:    Flow cytometry and molecular studies as indicated   History:  MR in computer, NM Bone Whole Body to be scheduled   Provider:  Brunetta Genera   Provider Contact:  [phone]

## 2021-08-20 NOTE — Telephone Encounter (Signed)
Eliquis Assistance faxed and sent to be scanned in.

## 2021-08-24 ENCOUNTER — Other Ambulatory Visit: Payer: Self-pay | Admitting: Neurology

## 2021-08-24 LAB — MULTIPLE MYELOMA PANEL, SERUM
Albumin SerPl Elph-Mcnc: 3.4 g/dL (ref 2.9–4.4)
Albumin/Glob SerPl: 1.4 (ref 0.7–1.7)
Alpha 1: 0.2 g/dL (ref 0.0–0.4)
Alpha2 Glob SerPl Elph-Mcnc: 0.7 g/dL (ref 0.4–1.0)
B-Globulin SerPl Elph-Mcnc: 1 g/dL (ref 0.7–1.3)
Gamma Glob SerPl Elph-Mcnc: 0.7 g/dL (ref 0.4–1.8)
Globulin, Total: 2.5 g/dL (ref 2.2–3.9)
IgA: 111 mg/dL (ref 61–437)
IgG (Immunoglobin G), Serum: 788 mg/dL (ref 603–1613)
IgM (Immunoglobulin M), Srm: 48 mg/dL (ref 15–143)
Total Protein ELP: 5.9 g/dL — ABNORMAL LOW (ref 6.0–8.5)

## 2021-08-26 ENCOUNTER — Other Ambulatory Visit: Payer: Self-pay | Admitting: Student

## 2021-08-27 ENCOUNTER — Ambulatory Visit (HOSPITAL_COMMUNITY)
Admission: RE | Admit: 2021-08-27 | Discharge: 2021-08-27 | Disposition: A | Discharge status: Home / Self Care | Payer: Medicare Other | Source: Ambulatory Visit | Attending: Hematology | Admitting: Hematology

## 2021-08-27 ENCOUNTER — Encounter (HOSPITAL_COMMUNITY): Payer: Self-pay

## 2021-08-27 ENCOUNTER — Other Ambulatory Visit: Payer: Self-pay | Admitting: Cardiovascular Disease

## 2021-08-27 ENCOUNTER — Other Ambulatory Visit: Payer: Self-pay

## 2021-08-27 DIAGNOSIS — C7951 Secondary malignant neoplasm of bone: Secondary | ICD-10-CM | POA: Insufficient documentation

## 2021-08-27 DIAGNOSIS — M899 Disorder of bone, unspecified: Secondary | ICD-10-CM | POA: Diagnosis not present

## 2021-08-27 DIAGNOSIS — C3490 Malignant neoplasm of unspecified part of unspecified bronchus or lung: Secondary | ICD-10-CM | POA: Diagnosis not present

## 2021-08-27 IMAGING — CT CT BIOPSY
1 of 2 series · 15 of 30 positions shown, 19 images · non-contrast
Comparison: none

CLINICAL DATA: Lytic and destructive bone lesion involving the
right posterior iliac bone and ischium.

[Series 2: i-spiral 5.0 br40 · axial · 0.98mm/px · z∈[+941,+1078]mm · 15 of 43 slices shown, 19 images]
[im 2/43  mediastinal]
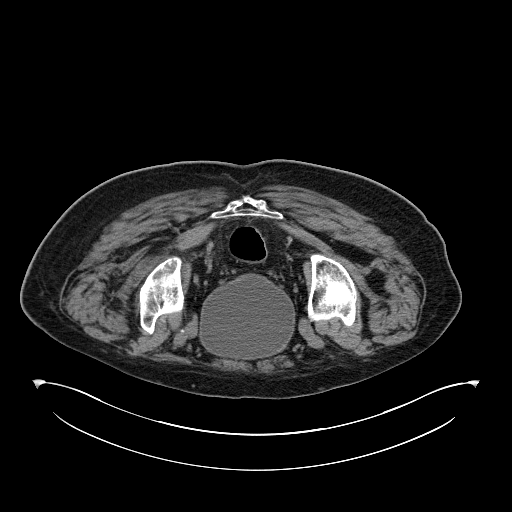
[im 2/43  lung]
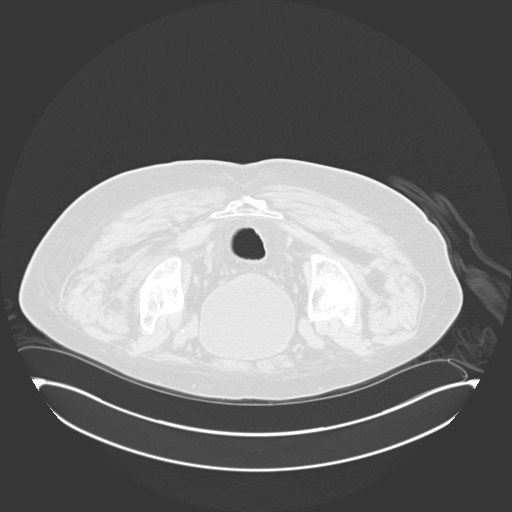
[im 6/43  lung]
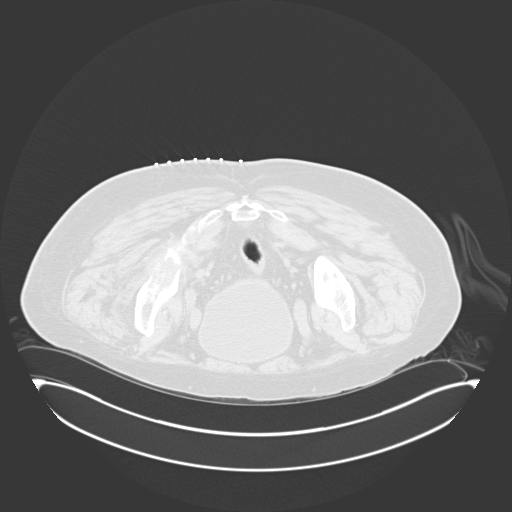
[im 8/43  lung]
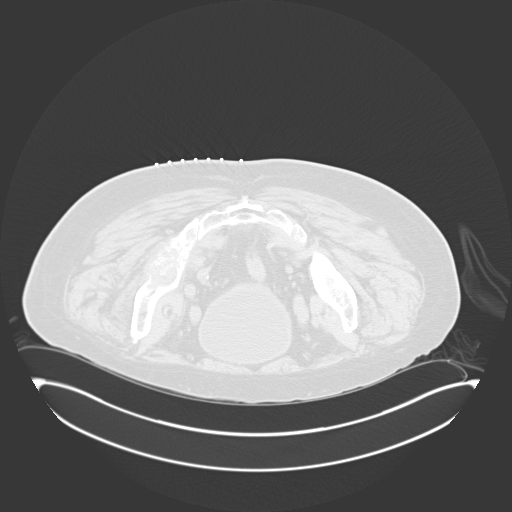
[im 11/43  lung]
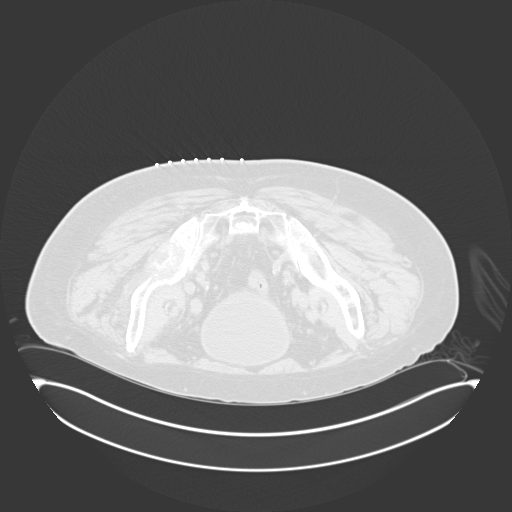
[im 12/43  mediastinal]
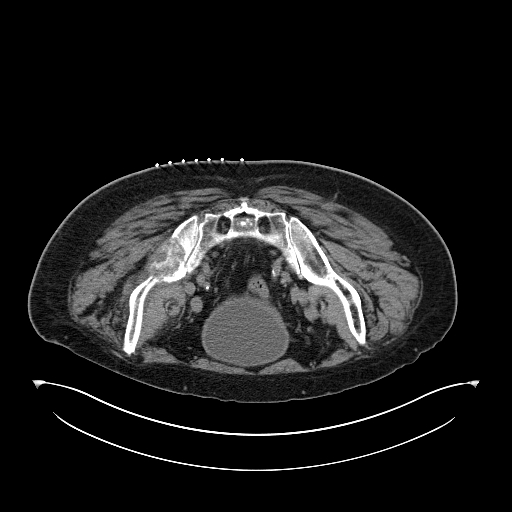
[im 12/43  lung]
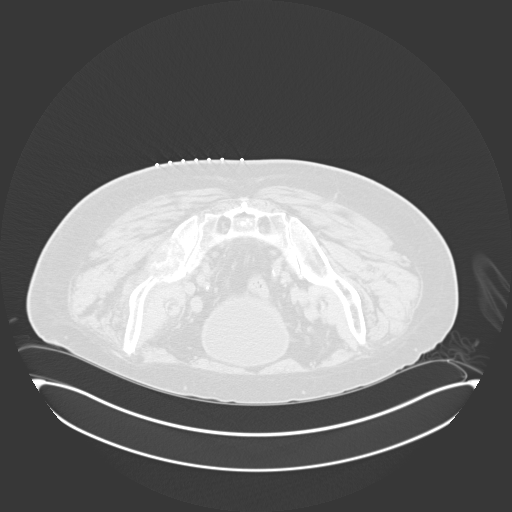
[im 16/43  lung]
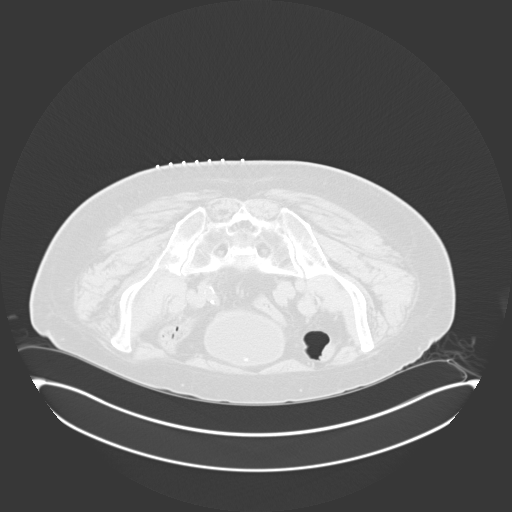
[im 18/43  lung]
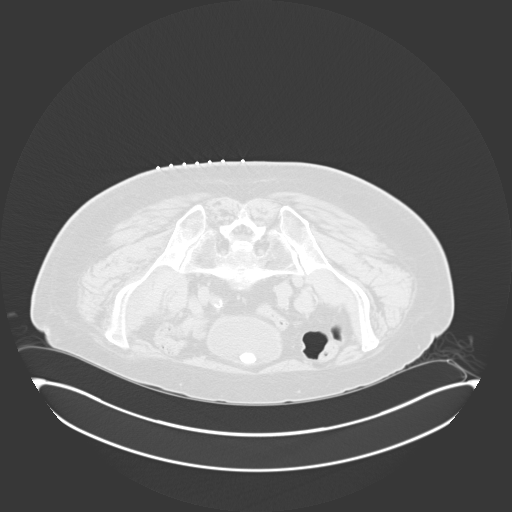
[im 22/43  lung]
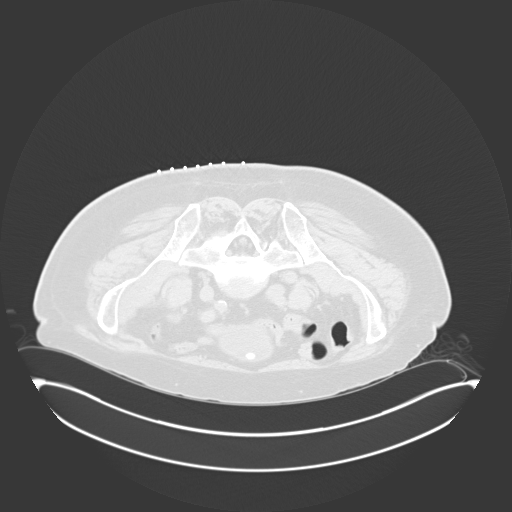
[im 25/43  mediastinal]
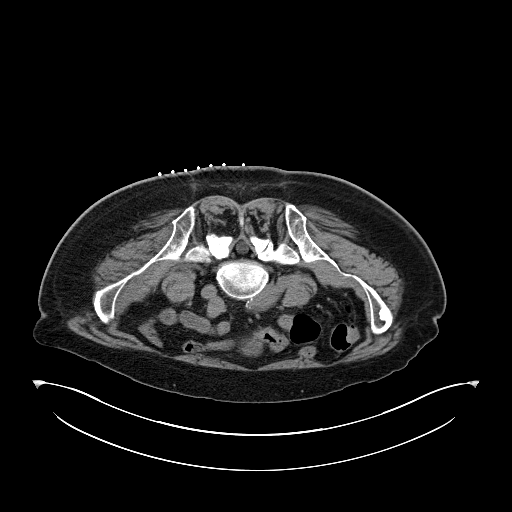
[im 25/43  lung]
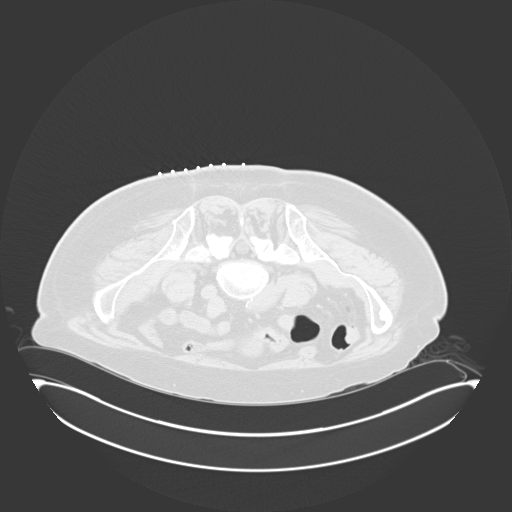
[im 27/43  lung]
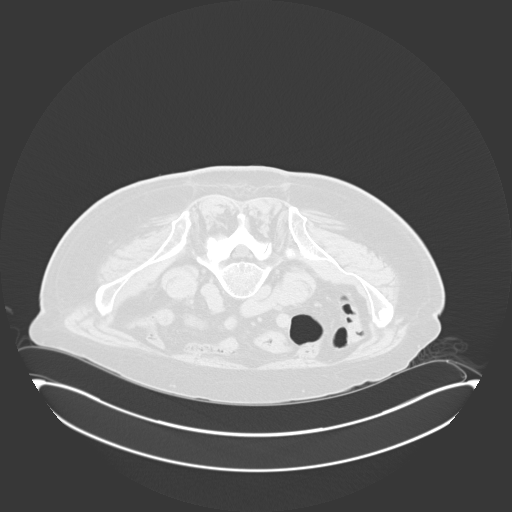
[im 31/43  lung]
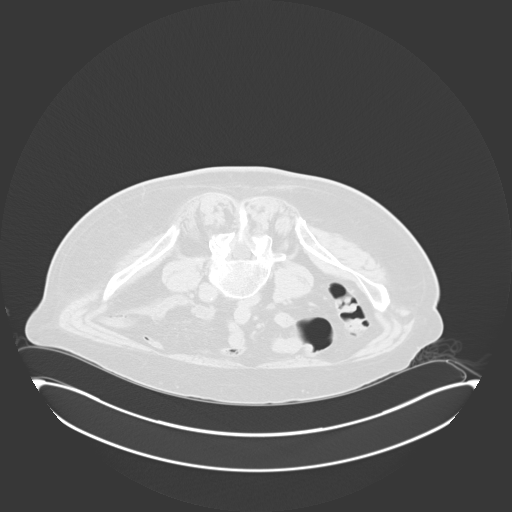
[im 32/43  lung]
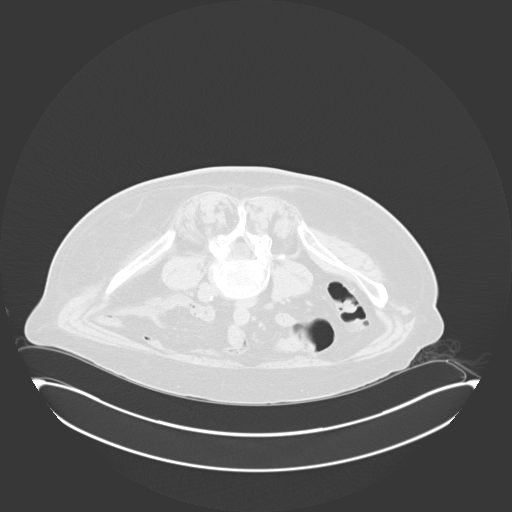
[im 35/43  mediastinal]
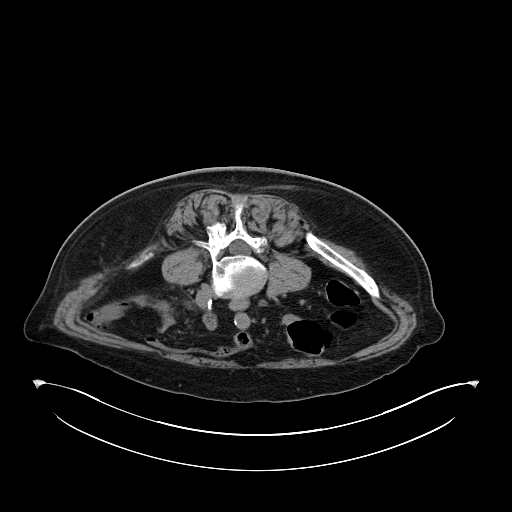
[im 35/43  lung]
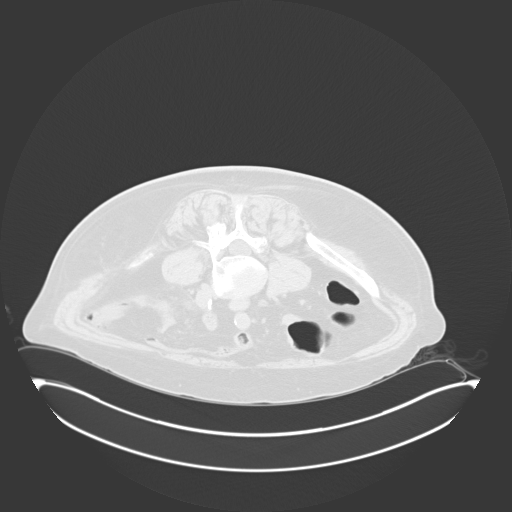
[im 37/43  lung]
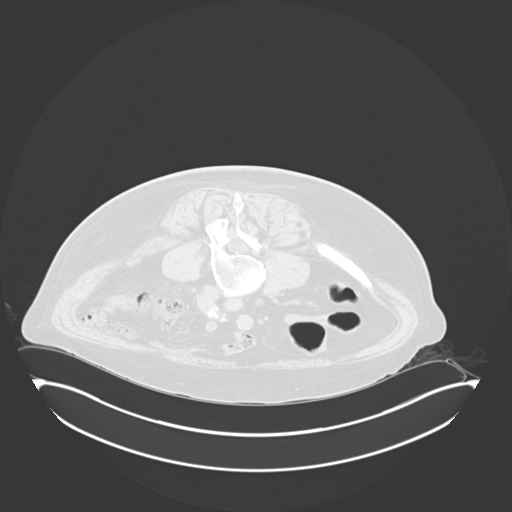
[im 41/43  lung]
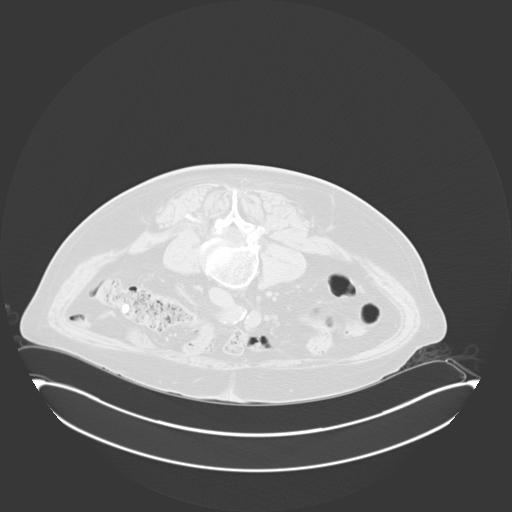

[15 of 30 positions shown; findings below may reference images not displayed]

EXAM:
CT GUIDED CORE BIOPSY OF RIGHT DEEP PELVIC ILIAC/ISCHIAL BONE LESION

ANESTHESIA/SEDATION:
2.0 mg IV Versed; 100 mcg IV Fentanyl

Total Moderate Sedation Time:  17 minutes.

The patient's level of consciousness and physiologic status were
continuously monitored during the procedure by Radiology nursing.

PROCEDURE:
The procedure risks, benefits, and alternatives were explained to
the patient. Questions regarding the procedure were encouraged and
answered. The patient understands and consents to the procedure. A
time-out was performed prior to initiating the procedure.

CT was performed through the bony pelvis in a prone position. The
right gluteal region was prepped with chlorhexidine in a sterile
fashion, and a sterile drape was applied covering the operative
field. A sterile gown and sterile gloves were used for the
procedure. Local anesthesia was provided with 1% Lidocaine.

Under CT guidance, an 11 gauge OnControl bone biopsy needle was
advanced to the posterior margin a lytic lesion centered in the
right iliac bone and also extending into the right ischium. This
needle was used in obtaining 2 separate core biopsy samples through
the lesion. Material was submitted in formalin.

COMPLICATIONS:
None
FINDINGS: Expansile and destructive lytic lesion of the right iliac bone and
ischium measures roughly 3.4 x 5.0 cm in maximum transverse
dimensions. Fragmented solid tissue was obtained from the lesion.
IMPRESSION: CT-guided core biopsy performed of a lytic bone lesion involving the
right iliac bone and ischium and measuring approximately 5 cm in
maximum transverse diameter.

## 2021-08-27 MED ORDER — MIDAZOLAM HCL 2 MG/2ML IJ SOLN
INTRAMUSCULAR | Status: DC | PRN
  Administered 2021-08-27: 1 mg via INTRAVENOUS

## 2021-08-27 MED ORDER — FENTANYL CITRATE (PF) 100 MCG/2ML IJ SOLN
INTRAMUSCULAR | Status: DC | PRN
  Administered 2021-08-27: 50 ug via INTRAVENOUS

## 2021-08-27 MED ORDER — MIDAZOLAM HCL 2 MG/2ML IJ SOLN
INTRAMUSCULAR | Status: AC
  Filled 2021-08-27: qty 4

## 2021-08-27 MED ORDER — NALOXONE HCL 0.4 MG/ML IJ SOLN
INTRAMUSCULAR | Status: AC
  Filled 2021-08-27: qty 1

## 2021-08-27 MED ORDER — FENTANYL CITRATE (PF) 100 MCG/2ML IJ SOLN
INTRAMUSCULAR | Status: AC
  Filled 2021-08-27: qty 2

## 2021-08-27 MED ORDER — FLUMAZENIL 0.5 MG/5ML IV SOLN
INTRAVENOUS | Status: AC
  Filled 2021-08-27: qty 5

## 2021-08-27 MED ORDER — LIDOCAINE HCL (PF) 1 % IJ SOLN
INTRAMUSCULAR | Status: DC | PRN
  Administered 2021-08-27: 10 mL via INTRADERMAL

## 2021-08-27 MED ORDER — SODIUM CHLORIDE 0.9 % IV SOLN: INTRAVENOUS | Status: DC

## 2021-08-27 NOTE — H&P (Signed)
Chief Complaint: Patient was seen in consultation today for right iliac biopsy at the request of Brunetta Genera  Referring Physician(s): Brunetta Genera  Supervising Physician: Aletta Edouard  Patient Status: [place]  History of Present Illness: Marcus Fuller is a 80 y.o. male with right iliac bone lesion suspicious for myeloma vs bone mets. He is referred for image guided biopsy. PMHx, meds, labs, imaging, allergies reviewed. Feels well, no recent fevers, chills, illness. Has been NPO today as directed.    Past Medical History:  Diagnosis Date   Anxiety    Cataract    beginning stages   Chronic low back pain 07/03/2019   Coronary artery disease    stent- 2007    History of kidney stones    Hypertension    Parkinson's disease ([place]) 01/29/2018   Presence of permanent cardiac pacemaker    2017    Tremor of right hand     Past Surgical History:  Procedure Laterality Date   APPENDECTOMY     bbb     CARDIOVERSION N/A 04/19/2021   Procedure: CARDIOVERSION;  Surgeon: Lelon Perla, MD;  Location: [facility] ENDOSCOPY;  Service: Cardiovascular;  Laterality: N/A;   CHOLECYSTECTOMY N/A 05/21/2018   Procedure: LAPAROSCOPIC CHOLECYSTECTOMY;  Surgeon: Coralie Keens, MD;  Location: [facility] ORS;  Service: General;  Laterality: N/A;   left knee arthroscopy   [place]  11/2016   STENT PLACEMENT VASCULAR ([place] HX)  2007   TONSILLECTOMY      Allergies: Codeine  Medications: Prior to Admission medications   Medication Sig Start Date End Date Taking? Authorizing Provider  acetaminophen (TYLENOL) 325 MG tablet Take 650 mg by mouth every 6 (six) hours as needed.   Yes [provider]  carbidopa-levodopa (SINEMET) 25-250 MG tablet Take 1 tablet by mouth 3 (three) times daily. 03/04/21  Yes Kathrynn Ducking, MD  digoxin (LANOXIN) 0.125 MG tablet TAKE 1 TABLET BY MOUTH EVERY DAY 03/30/21  Yes Croitoru, Mihai, MD  ELIQUIS 5 MG TABS tablet TAKE 1  TABLET BY MOUTH TWICE A DAY 04/07/21  Yes Croitoru, Mihai, MD  gabapentin (NEURONTIN) 600 MG tablet 1/2 tablet in the morning and midday, 1 at night 08/04/21  Yes Kathrynn Ducking, MD  HYDROmorphone (DILAUDID) 2 MG tablet Take 0.5 tablets (1 mg total) by mouth every 4 (four) hours as needed for severe pain. 08/19/21  Yes Brunetta Genera, MD  Melatonin 10 MG TABS Take 10 mg by mouth at bedtime.   Yes [provider]  methocarbamol (ROBAXIN) 500 MG tablet Take 500 mg by mouth every 8 (eight) hours. 05/11/21  Yes [provider]  Naproxen Sodium 220 MG CAPS  06/17/21  Yes [provider]  OVER THE COUNTER MEDICATION Apply 1 application topically at bedtime. Outback Relief   Yes [provider]  senna-docusate (SENNA S) 8.6-50 MG tablet Take 2 tablets by mouth at bedtime. 08/19/21  Yes Brunetta Genera, MD     Family History  Problem Relation Age of Onset   Heart disease Mother    Diabetes Mother    Cancer Mother        breast   Heart disease Father    Heart attack Father    Atrial fibrillation Sister    Heart disease Brother    Pancreatic disease Brother    Tremor Brother    Heart attack Paternal Grandfather    Spina bifida Brother     Social History  Socioeconomic History   Marital status: Married    Spouse name: Cecelia   Number of children: 2   Years of education: 14   Highest education level: Not on file  Occupational History   Not on file  Tobacco Use   Smoking status: Never   Smokeless tobacco: Never  Vaping Use   Vaping Use: Never used  Substance and Sexual Activity   Alcohol use: Yes    Comment: social    Drug use: No   Sexual activity: Not Currently  Other Topics Concern   Not on file  Social History Narrative   Lives w/ wife   Caffeine use: sometimes   Right handed    Social Determinants of Health   Financial Resource Strain: Not on file  Food Insecurity: Not on file  Transportation Needs: Not on file  Physical  Activity: Not on file  Stress: Not on file  Social Connections: Not on file     Review of Systems: A 12 point ROS discussed and pertinent positives are indicated in the HPI above.  All other systems are negative.  Review of Systems  Vital Signs: BP (!) 169/104   Pulse 64   Temp 98.4 F (36.9 C) (Oral)   Resp 18   SpO2 95%   Physical Exam Constitutional:      Appearance: Normal appearance.  HENT:     Mouth/Throat:     Mouth: Mucous membranes are moist.     Pharynx: Oropharynx is clear.  Cardiovascular:     Rate and Rhythm: Normal rate and regular rhythm.     Heart sounds: Normal heart sounds.  Pulmonary:     Effort: Pulmonary effort is normal. No respiratory distress.     Breath sounds: Normal breath sounds.  Neurological:     General: No focal deficit present.     Mental Status: He is alert and oriented to person, place, and time.  Psychiatric:        Mood and Affect: Mood normal.        Thought Content: Thought content normal.        Judgment: Judgment normal.    Imaging: MR LUMBAR SPINE WO CONTRAST  Result Date: 08/04/2021 CLINICAL DATA:  Low back pain with right hip and leg pain EXAM: MRI LUMBAR SPINE WITHOUT CONTRAST TECHNIQUE: Multiplanar, multisequence MR imaging of the lumbar spine was performed. No intravenous contrast was administered. COMPARISON:  CT lumbar spine 08/03/2018 FINDINGS: Segmentation:  Standard. Alignment: There is mild dextroscoliosis of the lumbar spine. There is grade 1 retrolisthesis of L3 on L4, similar to the prior CT. Alignment otherwise normal. Vertebrae: Vertebral body heights are preserved. There are two small foci of T1 hypointensity in the L2 and L3 vertebral bodies measuring up to 0.9 cm. There is multilevel facet arthropathy, most advanced at L3-L4 through L5-S1. There is a trace left effusion at L3-L4. Conus medullaris and cauda equina: Conus extends to the T12-L1 level. Conus and cauda equina appear normal. Paraspinal and other soft  tissues: The paraspinal soft tissues are unremarkable. A right renal cyst is noted. Disc levels: There is multilevel disc desiccation with mild loss of height. Bulky anterior osteophytes are noted at L1-L2 and L2-L3. T12-L1: No significant spinal canal or neural foraminal stenosis. L1-L2: No significant spinal canal or neural foraminal stenosis. L2-L3: No significant spinal canal or neural foraminal stenosis. L3-L4: There is a mild disc bulge, ligamentum flavum thickening, and bilateral facet arthropathy resulting in slight crowding of the right subarticular zone without  evidence of nerve root impingement and mild right worse than left neural foraminal stenosis. L4-L5: There is a mild disc bulge, ligamentum flavum thickening, and bilateral facet arthropathy resulting in crowding of the right subarticular zone without evidence of nerve root impingement and moderate right and no significant left neural foraminal stenosis. L5-S1: There is a mild disc bulge and bilateral facet arthropathy resulting in mild right and no significant left neural foraminal stenosis and no significant spinal canal stenosis. Other: There is an expansile T1 hypointense lesion in the right iliac wing measuring up to 5.1 cm x 3.2 cm, incompletely imaged. This lesion was not present on the prior CT lumbar spine of 08/03/2018 There is trace free fluid in the abdomen, nonspecific. IMPRESSION: 1. Expansile lesion in the right iliac bone is concerning for malignancy, particularly metastatic disease; correlate with any history of malignancy. If there is no history of malignancy, oncologic workup and dedicated MRI of the hip with and without contrast are recommended for further evaluation. 2. Two small foci of signal abnormality in the L2 and L3 vertebral bodies are suspicious for additional small metastases. MRI of the lumbar spine with and without contrast may be helpful for better characterization. 3. Mild multilevel degenerative changes of the  lumbar spine detailed above resulting in up to moderate right neural foraminal stenosis at L4-L5. Otherwise, no high-grade spinal canal or neural foraminal stenosis. 4. Trace free fluid in the abdomen, nonspecific. These results were called by telephone at the time of interpretation on 08/04/2021 at 1:44 pm to provider MICHAEL HILTS , who verbally acknowledged these results. Electronically Signed   By: Valetta Mole M.D.   On: 08/04/2021 13:44    Labs:  CBC: Recent Labs    04/06/21 1558 08/19/21 1302  WBC 9.0 9.4  HGB 16.0 15.8  HCT 50.7 48.0  PLT 161 166    COAGS: No results for input(s): INR, APTT in the last 8760 hours.  BMP: Recent Labs    04/06/21 1558 08/19/21 1302  NA 140 142  K 4.1 4.2  CL 105 107  CO2 29 25  GLUCOSE 133* 110*  BUN 9 13  CALCIUM 9.8 10.1  CREATININE 0.99 0.86  GFRNONAA >60 >60    LIVER FUNCTION TESTS: Recent Labs    08/19/21 1302  BILITOT 1.1  AST 30  ALT 12  ALKPHOS 184*  PROT 6.4*  ALBUMIN 3.6    TUMOR MARKERS: No results for input(s): AFPTM, CEA, CA199, CHROMGRNA in the last 8760 hours.  Assessment and Plan: Right iliac bone lesion For CT guided biopsy .Risks and benefits of bone biopsy was discussed with the patient and/or patient's family including, but not limited to bleeding, infection, damage to adjacent structures or low yield requiring additional tests.  All of the questions were answered and there is agreement to proceed.  Consent signed and in chart.   Thank you for this interesting consult.  I greatly enjoyed meeting Marcus Fuller and look forward to participating in their care.  A copy of this report was sent to the requesting provider on this date.  Electronically Signed: Ascencion Dike, PA-C 08/27/2021, 8:46 AM   I spent a total of 20 minutes in face to face in clinical consultation, greater than 50% of which was counseling/coordinating care for bone biopsy

## 2021-08-27 NOTE — Procedures (Signed)
Interventional Radiology Procedure Note  Procedure: CT Guided Biopsy of right iliac/ischial bone lesion  Complications: None  Estimated Blood Loss: < 10 mL  Findings: 11 G core biopsy of right pelvic bone lesion performed under CT guidance.  Two core samples obtained and sent to Pathology.  Venetia Night. Kathlene Cote, M.D Pager:  [phone]

## 2021-08-27 NOTE — Discharge Instructions (Signed)
Please call Interventional Radiology clinic [phone] with any questions or concerns.   You may remove your dressing and shower tomorrow.    Needle Biopsy, Care After These instructions tell you how to care for yourself after your procedure. Your doctor may also give you more specific instructions. Call your doctor if youhave any problems or questions. What can I expect after the procedure? After the procedure, it is common to have: Soreness. Bruising. Mild pain. Follow these instructions at home:  Return to your normal activities as told by your doctor. Ask your doctor what activities are safe for you. Take over-the-counter and prescription medicines only as told by your doctor. Wash your hands with soap and water before you change your bandage (dressing). If you cannot use soap and water, use hand sanitizer. Follow instructions from your doctor about: How to take care of your puncture site. When and how to change your bandage. When to remove your bandage. Check your puncture site every day for signs of infection. Watch for: Redness, swelling, or pain. Fluid or blood. Pus or a bad smell. Warmth. Do not take baths, swim, or use a hot tub until your doctor approves. Ask your doctor if you may take showers. You may only be allowed to take sponge baths. Keep all follow-up visits as told by your doctor. This is important. Contact a doctor if you have: A fever. Redness, swelling, or pain at the puncture site, and it lasts longer than a few days. Fluid, blood, or pus coming from the puncture site. Warmth coming from the puncture site. Get help right away if: You have a lot of bleeding from the puncture site. Summary After the procedure, it is common to have soreness, bruising, or mild pain at the puncture site. Check your puncture site every day for signs of infection, such as redness, swelling, or pain. Get help right away if you have severe bleeding from your puncture  site. This information is not intended to replace advice given to you by your health care provider. Make sure you discuss any questions you have with your healthcare provider. Document Revised: 06/04/2020 Document Reviewed: 06/04/2020 Elsevier Patient Education  2022 Elsevier Inc.   Moderate Conscious Sedation, Adult, Care After This sheet gives you information about how to care for yourself after your procedure. Your health care provider may also give you more specific instructions. If you have problems or questions, contact your health care provider. What can I expect after the procedure? After the procedure, it is common to have: Sleepiness for several hours. Impaired judgment for several hours. Difficulty with balance. Vomiting if you eat too soon. Follow these instructions at home: For the time period you were told by your health care provider: Rest. Do not participate in activities where you could fall or become injured. Do not drive or use machinery. Do not drink alcohol. Do not take sleeping pills or medicines that cause drowsiness. Do not make important decisions or sign legal documents. Do not take care of children on your own.      Eating and drinking Follow the diet recommended by your health care provider. Drink enough fluid to keep your urine pale yellow. If you vomit: Drink water, juice, or soup when you can drink without vomiting. Make sure you have little or no nausea before eating solid foods.   General instructions Take over-the-counter and prescription medicines only as told by your health care provider. Have a responsible adult stay with you for the time you are told.   It is important to have someone help care for you until you are awake and alert. Do not smoke. Keep all follow-up visits as told by your health care provider. This is important. Contact a health care provider if: You are still sleepy or having trouble with balance after 24 hours. You feel  light-headed. You keep feeling nauseous or you keep vomiting. You develop a rash. You have a fever. You have redness or swelling around the IV site. Get help right away if: You have trouble breathing. You have new-onset confusion at home. Summary After the procedure, it is common to feel sleepy, have impaired judgment, or feel nauseous if you eat too soon. Rest after you get home. Know the things you should not do after the procedure. Follow the diet recommended by your health care provider and drink enough fluid to keep your urine pale yellow. Get help right away if you have trouble breathing or new-onset confusion at home. This information is not intended to replace advice given to you by your health care provider. Make sure you discuss any questions you have with your health care provider. Document Revised: 04/03/2020 Document Reviewed: 10/31/2019 Elsevier Patient Education  2021 Elsevier Inc.     

## 2021-08-30 NOTE — Telephone Encounter (Signed)
Returned call to patient's wife who states they had received a call from [facility] Patient assistance foundation stating that they had received all of the patient related information that was needed but had not received the provider portion. Advised that it looked like this had been faxed over but that I would forward message to Lattie Haw, RN to inquire about this. Patients wife verbalized understanding.

## 2021-08-30 NOTE — Telephone Encounter (Signed)
Pt is returning call in regards to his Eliquis Assistance paperwork, He received a call this morning from [facility] stating that some of the paperwork was missing information. Please advise pt further

## 2021-08-31 ENCOUNTER — Encounter: Payer: Self-pay | Admitting: *Deleted

## 2021-08-31 ENCOUNTER — Other Ambulatory Visit: Payer: Self-pay | Admitting: Hematology

## 2021-08-31 DIAGNOSIS — C7951 Secondary malignant neoplasm of bone: Secondary | ICD-10-CM

## 2021-08-31 LAB — SURGICAL PATHOLOGY

## 2021-08-31 NOTE — Telephone Encounter (Signed)
Patient and Marcus Fuller has been made aware that the paperwork has been faxed again.

## 2021-08-31 NOTE — Progress Notes (Signed)
Pathology contacted me to help with foundation one.  This is a patient of Dr. Irene Limbo with an unclear diagnosis. I asked pathology to call Dr. Grier Mitts nurse for assistance.

## 2021-09-02 ENCOUNTER — Ambulatory Visit: Payer: Medicare Other | Admitting: Hematology

## 2021-09-03 ENCOUNTER — Encounter (HOSPITAL_COMMUNITY): Payer: Self-pay

## 2021-09-03 ENCOUNTER — Ambulatory Visit (HOSPITAL_COMMUNITY)
Admission: RE | Admit: 2021-09-03 | Discharge: 2021-09-03 | Disposition: A | Discharge status: Home / Self Care | Payer: Medicare Other | Source: Ambulatory Visit | Attending: Hematology | Admitting: Hematology

## 2021-09-03 ENCOUNTER — Other Ambulatory Visit: Payer: Self-pay

## 2021-09-03 ENCOUNTER — Ambulatory Visit (HOSPITAL_COMMUNITY): Payer: Medicare Other

## 2021-09-03 DIAGNOSIS — N210 Calculus in bladder: Secondary | ICD-10-CM | POA: Diagnosis not present

## 2021-09-03 DIAGNOSIS — C7951 Secondary malignant neoplasm of bone: Secondary | ICD-10-CM | POA: Diagnosis not present

## 2021-09-03 DIAGNOSIS — K449 Diaphragmatic hernia without obstruction or gangrene: Secondary | ICD-10-CM | POA: Diagnosis not present

## 2021-09-03 DIAGNOSIS — N200 Calculus of kidney: Secondary | ICD-10-CM | POA: Diagnosis not present

## 2021-09-03 DIAGNOSIS — J90 Pleural effusion, not elsewhere classified: Secondary | ICD-10-CM | POA: Diagnosis not present

## 2021-09-03 DIAGNOSIS — C3490 Malignant neoplasm of unspecified part of unspecified bronchus or lung: Secondary | ICD-10-CM | POA: Diagnosis not present

## 2021-09-03 DIAGNOSIS — I2510 Atherosclerotic heart disease of native coronary artery without angina pectoris: Secondary | ICD-10-CM | POA: Diagnosis not present

## 2021-09-03 DIAGNOSIS — K4020 Bilateral inguinal hernia, without obstruction or gangrene, not specified as recurrent: Secondary | ICD-10-CM | POA: Diagnosis not present

## 2021-09-03 IMAGING — CT CT CHEST-ABD-PELV W/ CM
2 of 5 series · 13 of 36 positions shown, 15 images · IV contrast (APPLIED)
Comparison: /[DATE].

CLINICAL DATA: Initial staging of newly diagnosed, likely
metastatic lung cancer. Recent biopsy of a right iliac bone lesion.

EXAM:
CT CHEST, ABDOMEN, AND PELVIS WITH CONTRAST
TECHNIQUE: Multidetector CT imaging of the chest, abdomen and pelvis was
performed following the standard protocol during bolus
administration of intravenous contrast.
CONTRAST:  80mL OMNIPAQUE IOHEXOL 350 MG/ML SOLN

[Series 2: cap with · axial · 0.88mm/px · z∈[-514,+50]mm · 10 of 139 slices shown, 12 images]
[im 13/139  mediastinal]
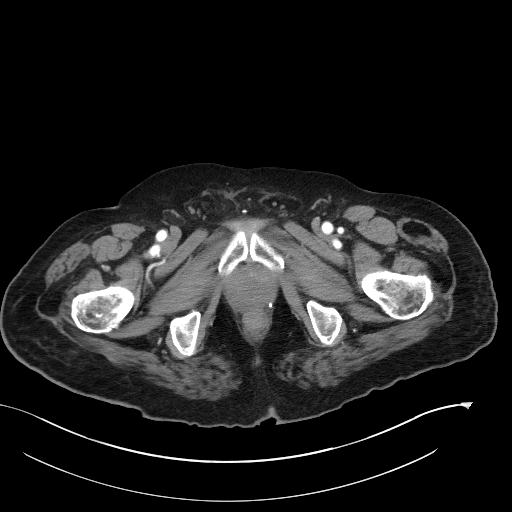
[im 13/139  bone]
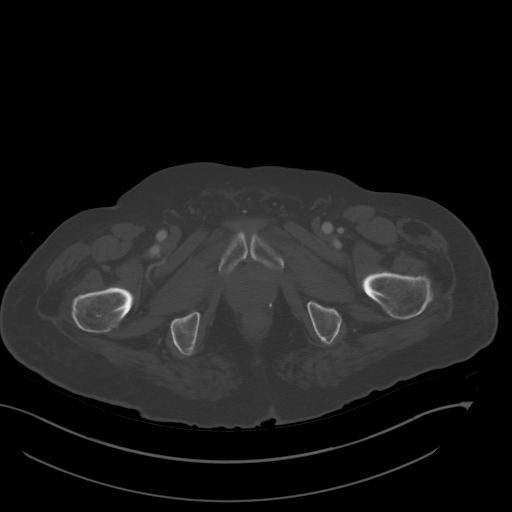
[im 26/139  mediastinal]
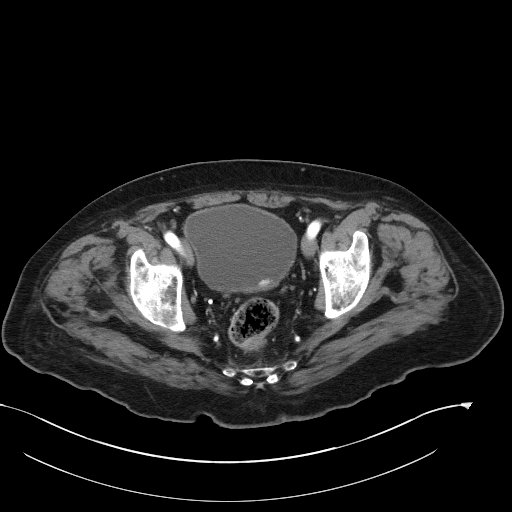
[im 38/139  mediastinal]
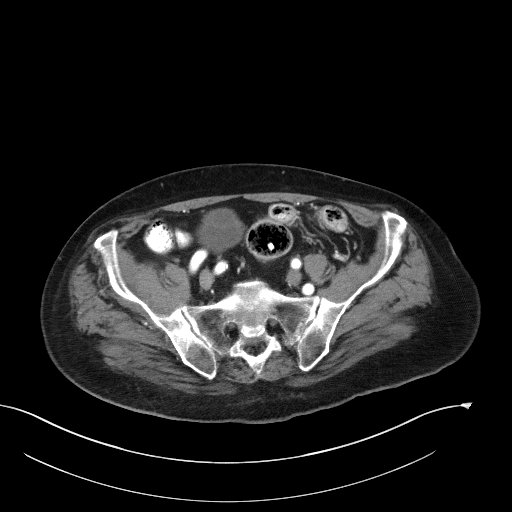
[im 51/139  mediastinal]
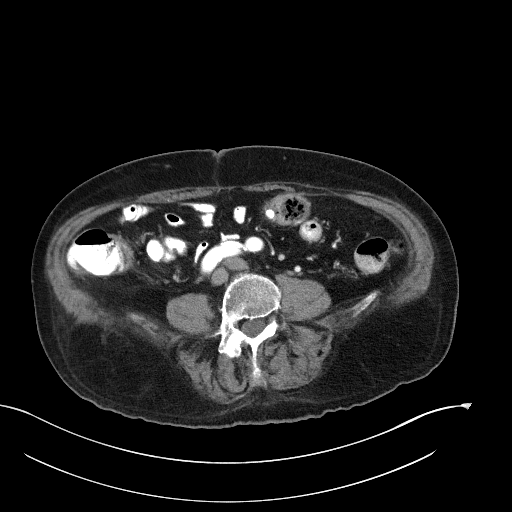
[im 63/139  mediastinal]
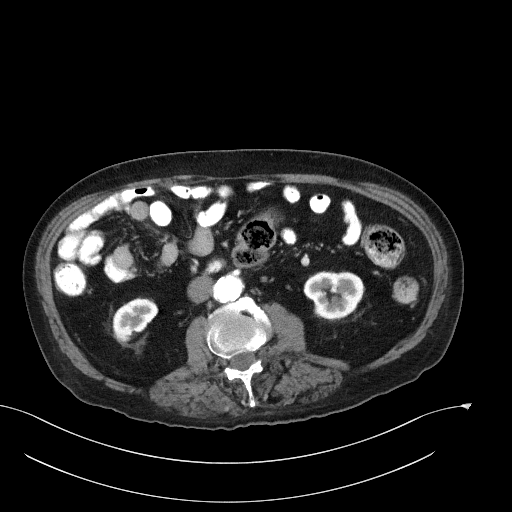
[im 76/139  mediastinal]
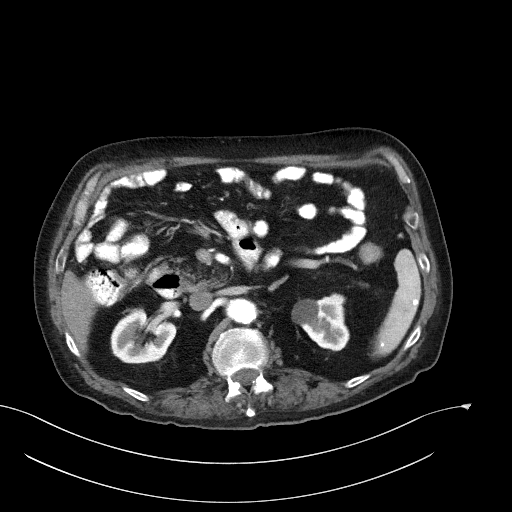
[im 88/139  mediastinal]
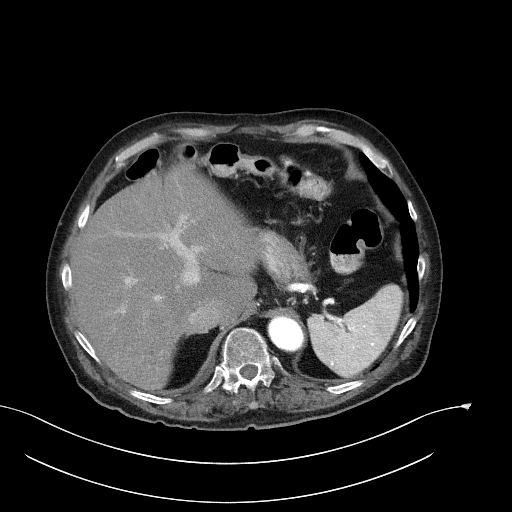
[im 101/139  mediastinal]
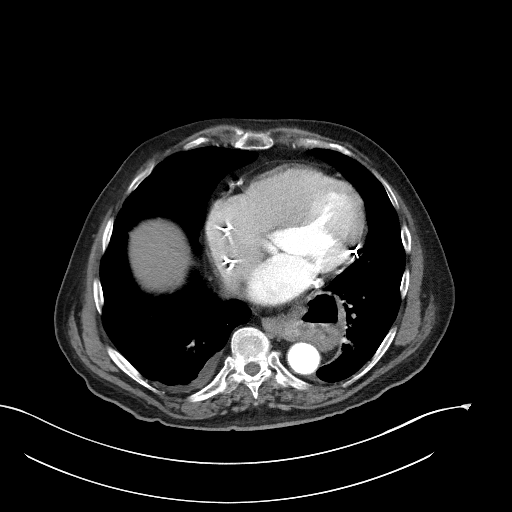
[im 113/139  mediastinal]
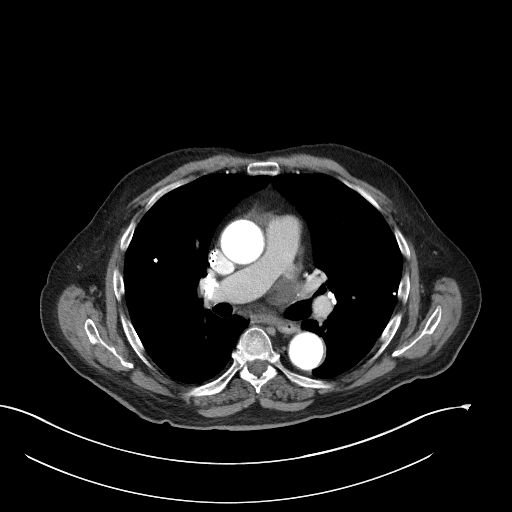
[im 113/139  bone]
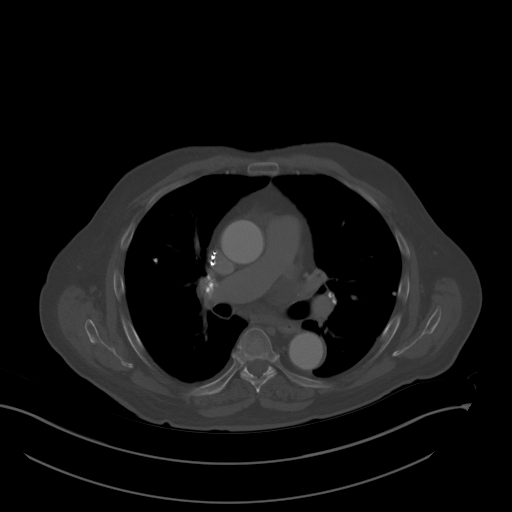
[im 126/139  mediastinal]
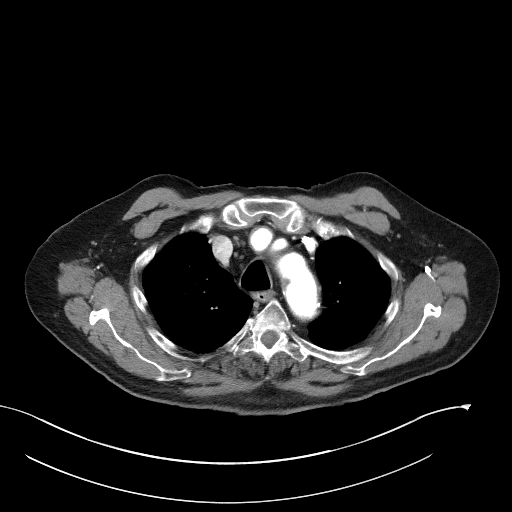

[Series 5: coronals · coronal · 0.83mm/px · 3 of 149 slices shown]
[im 30/149  mediastinal]
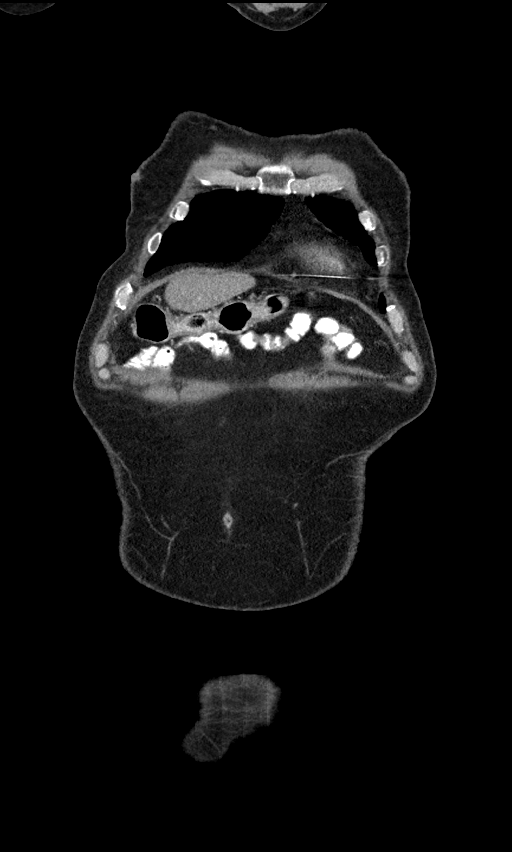
[im 60/149  mediastinal]
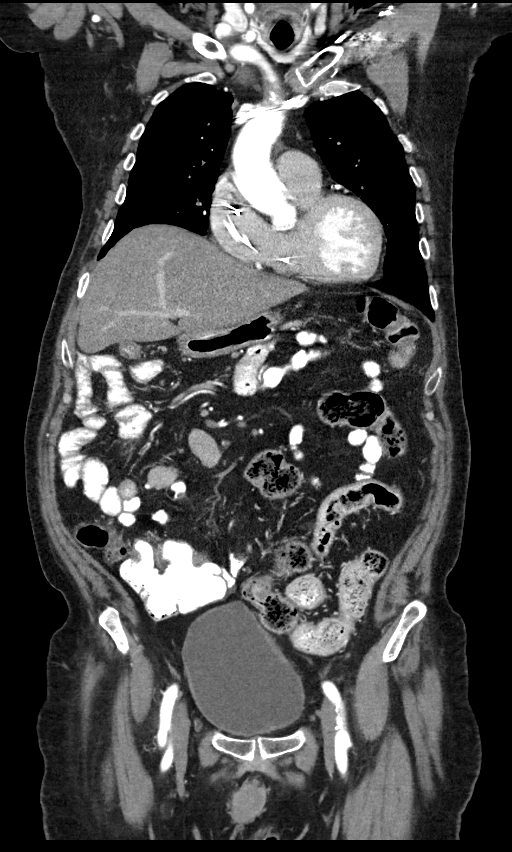
[im 89/149  mediastinal]
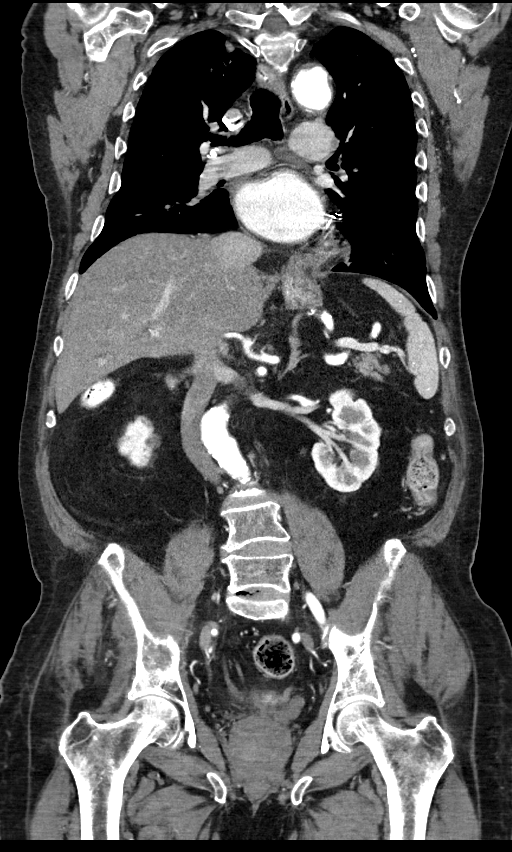

[13 of 36 positions shown; findings below may reference images not displayed]

FINDINGS: CT CHEST FINDINGS

Cardiovascular: Pacer. Aortic atherosclerosis. Tortuous thoracic
aorta. Mild cardiomegaly with 3 vessel coronary artery
calcification. No central pulmonary embolism, on this non-dedicated
study.

Mediastinum/Nodes: No supraclavicular adenopathy. No mediastinal or
hilar adenopathy. Moderate hiatal hernia, with [DATE] of the stomach in
the lower chest.

Lungs/Pleura: Trace right larger than left pleural effusions.

Minimal motion degradation in the lower chest.

Calcified and noncalcified pulmonary nodules. The largest
noncalcified nodule is in the right apex with mild spiculation,
including at 1.2 x 1.1 cm on [DATE]. No other noncalcified nodules,
including within the Peri fissural superior segment right lower lobe
at 6 mm on 52/4.

Musculoskeletal: No acute osseous abnormality.

CT ABDOMEN PELVIS FINDINGS

Hepatobiliary: Probable hepatic steatosis. Nonspecific caudate lobe
enlargement. No focal liver lesion. Cholecystectomy, without biliary
ductal dilatation.

Pancreas: Fatty replacement involving the pancreatic head and
uncinate process. No duct dilatation or acute inflammation.

Spleen: Old granulomatous disease in the spleen.

Adrenals/Urinary Tract: Normal adrenal glands. Mild renal cortical
thinning bilaterally. Punctate upper pole right renal collecting
system calculus. Low-density bilateral renal lesions are likely
cysts. There also too small to characterize lesions in both kidneys.

Left-sided bladder stone of 2.0 cm.

Stomach/Bowel: Normal remainder of the stomach. Normal large and
small bowel loops.

Vascular/Lymphatic: Aortic atherosclerosis. No abdominopelvic
adenopathy.

Reproductive: Moderate prostatomegaly.

Other: Small bilateral fat containing inguinal hernias. No
significant free fluid.

Musculoskeletal: Again identified is a lipoma within the left-sided
tensor fascia lata.

Mild osteopenia. Left iliac wing sclerotic lesion is similar and
considered benign.

The right iliac destructive lesion is again identified, including at
4.9 cm on 107/2.
IMPRESSION: 1. Right apical 12 mm lesion is suspicious for primary bronchogenic
carcinoma, given clinical history. Other smaller pulmonary nodules
are indeterminate, but metastatic disease cannot be excluded.
2. No thoracic adenopathy.
3. No primary malignancy or soft tissue metastasis within the
abdomen or pelvis.
4. Right iliac lesion, as on prior MRI.
5. Tiny bilateral pleural effusions.
6. Probable hepatic steatosis and nonspecific caudate lobe
enlargement.
7. Enlarging bladder stone and right nephrolithiasis.
8. Coronary artery atherosclerosis. Aortic Atherosclerosis
([LW]-[LW]).
9. Hiatal hernia

## 2021-09-03 MED ORDER — IOHEXOL 350 MG/ML SOLN
100.0000 mL | Freq: Once | INTRAVENOUS | Status: AC | PRN
  Administered 2021-09-03: 80 mL via INTRAVENOUS

## 2021-09-03 NOTE — Telephone Encounter (Signed)
Notification received from [facility]. The patient will need to show proof of 3% income spent on prescriptions. [place] stated that they can include both the patient and his wife. As of right now, they need to spend $534 more to get approved.   The wife has been made aware and will check with [facility] to see if she has spent that much. She will bring proof when she comes for the samples.  Medication Samples have been provided to the patient.  Drug name: Eloquis       Strength: 5 mg        Qty: 3 boxes  LOT: [id number]  Exp.Date: 8/24

## 2021-09-06 ENCOUNTER — Other Ambulatory Visit: Payer: Self-pay

## 2021-09-06 ENCOUNTER — Inpatient Hospital Stay (HOSPITAL_BASED_OUTPATIENT_CLINIC_OR_DEPARTMENT_OTHER): Payer: Medicare Other | Admitting: Hematology

## 2021-09-06 VITALS — BP 127/71 | HR 65 | Temp 97.7°F | Resp 17

## 2021-09-06 DIAGNOSIS — I700 Atherosclerosis of aorta: Secondary | ICD-10-CM | POA: Diagnosis not present

## 2021-09-06 DIAGNOSIS — R531 Weakness: Secondary | ICD-10-CM | POA: Diagnosis not present

## 2021-09-06 DIAGNOSIS — C3411 Malignant neoplasm of upper lobe, right bronchus or lung: Secondary | ICD-10-CM | POA: Diagnosis not present

## 2021-09-06 DIAGNOSIS — C7951 Secondary malignant neoplasm of bone: Secondary | ICD-10-CM

## 2021-09-06 DIAGNOSIS — M8580 Other specified disorders of bone density and structure, unspecified site: Secondary | ICD-10-CM | POA: Diagnosis not present

## 2021-09-06 DIAGNOSIS — K449 Diaphragmatic hernia without obstruction or gangrene: Secondary | ICD-10-CM | POA: Diagnosis not present

## 2021-09-06 DIAGNOSIS — I2510 Atherosclerotic heart disease of native coronary artery without angina pectoris: Secondary | ICD-10-CM | POA: Diagnosis not present

## 2021-09-06 DIAGNOSIS — E785 Hyperlipidemia, unspecified: Secondary | ICD-10-CM | POA: Diagnosis not present

## 2021-09-06 DIAGNOSIS — M48061 Spinal stenosis, lumbar region without neurogenic claudication: Secondary | ICD-10-CM | POA: Diagnosis not present

## 2021-09-06 DIAGNOSIS — C3491 Malignant neoplasm of unspecified part of right bronchus or lung: Secondary | ICD-10-CM | POA: Diagnosis not present

## 2021-09-06 DIAGNOSIS — I119 Hypertensive heart disease without heart failure: Secondary | ICD-10-CM | POA: Diagnosis not present

## 2021-09-06 DIAGNOSIS — N200 Calculus of kidney: Secondary | ICD-10-CM | POA: Diagnosis not present

## 2021-09-06 DIAGNOSIS — M47816 Spondylosis without myelopathy or radiculopathy, lumbar region: Secondary | ICD-10-CM | POA: Diagnosis not present

## 2021-09-06 DIAGNOSIS — G893 Neoplasm related pain (acute) (chronic): Secondary | ICD-10-CM | POA: Diagnosis not present

## 2021-09-06 DIAGNOSIS — D329 Benign neoplasm of meninges, unspecified: Secondary | ICD-10-CM | POA: Diagnosis not present

## 2021-09-06 DIAGNOSIS — C7931 Secondary malignant neoplasm of brain: Secondary | ICD-10-CM | POA: Diagnosis not present

## 2021-09-06 DIAGNOSIS — N281 Cyst of kidney, acquired: Secondary | ICD-10-CM | POA: Diagnosis not present

## 2021-09-06 DIAGNOSIS — M79651 Pain in right thigh: Secondary | ICD-10-CM | POA: Diagnosis not present

## 2021-09-06 DIAGNOSIS — G20 Parkinson's disease: Secondary | ICD-10-CM | POA: Diagnosis not present

## 2021-09-06 DIAGNOSIS — C801 Malignant (primary) neoplasm, unspecified: Secondary | ICD-10-CM | POA: Diagnosis not present

## 2021-09-06 DIAGNOSIS — C3490 Malignant neoplasm of unspecified part of unspecified bronchus or lung: Secondary | ICD-10-CM | POA: Diagnosis not present

## 2021-09-06 DIAGNOSIS — I482 Chronic atrial fibrillation, unspecified: Secondary | ICD-10-CM | POA: Diagnosis not present

## 2021-09-06 DIAGNOSIS — K4020 Bilateral inguinal hernia, without obstruction or gangrene, not specified as recurrent: Secondary | ICD-10-CM | POA: Diagnosis not present

## 2021-09-06 DIAGNOSIS — N210 Calculus in bladder: Secondary | ICD-10-CM | POA: Diagnosis not present

## 2021-09-06 DIAGNOSIS — I10 Essential (primary) hypertension: Secondary | ICD-10-CM | POA: Diagnosis not present

## 2021-09-06 DIAGNOSIS — J90 Pleural effusion, not elsewhere classified: Secondary | ICD-10-CM | POA: Diagnosis not present

## 2021-09-06 MED ORDER — FENTANYL 12 MCG/HR TD PT72: 1.0000 | MEDICATED_PATCH | TRANSDERMAL | 0 refills | Status: DC

## 2021-09-07 ENCOUNTER — Telehealth: Payer: Self-pay | Admitting: Hematology

## 2021-09-07 NOTE — Telephone Encounter (Signed)
Scheduled follow-up appointments per 9/19 los. Patient's wife is aware.

## 2021-09-08 ENCOUNTER — Other Ambulatory Visit: Payer: Self-pay | Admitting: Radiation Oncology

## 2021-09-08 ENCOUNTER — Encounter: Payer: Self-pay | Admitting: Radiation Oncology

## 2021-09-08 ENCOUNTER — Encounter: Payer: Self-pay | Admitting: *Deleted

## 2021-09-08 ENCOUNTER — Telehealth: Payer: Self-pay | Admitting: Radiation Oncology

## 2021-09-08 ENCOUNTER — Ambulatory Visit
Admission: RE | Admit: 2021-09-08 | Discharge: 2021-09-08 | Disposition: A | Discharge status: Home / Self Care | Payer: Medicare Other | Source: Ambulatory Visit | Attending: Radiation Oncology | Admitting: Radiation Oncology

## 2021-09-08 ENCOUNTER — Other Ambulatory Visit: Payer: Self-pay | Admitting: Hematology

## 2021-09-08 ENCOUNTER — Other Ambulatory Visit: Payer: Self-pay

## 2021-09-08 ENCOUNTER — Encounter (HOSPITAL_COMMUNITY): Payer: Self-pay | Admitting: Hematology

## 2021-09-08 VITALS — Ht 70.0 in | Wt 195.0 lb

## 2021-09-08 DIAGNOSIS — C4490 Unspecified malignant neoplasm of skin, unspecified: Secondary | ICD-10-CM | POA: Insufficient documentation

## 2021-09-08 DIAGNOSIS — C3411 Malignant neoplasm of upper lobe, right bronchus or lung: Secondary | ICD-10-CM | POA: Diagnosis not present

## 2021-09-08 DIAGNOSIS — C3490 Malignant neoplasm of unspecified part of unspecified bronchus or lung: Secondary | ICD-10-CM

## 2021-09-08 DIAGNOSIS — C7931 Secondary malignant neoplasm of brain: Secondary | ICD-10-CM | POA: Diagnosis not present

## 2021-09-08 DIAGNOSIS — C7951 Secondary malignant neoplasm of bone: Secondary | ICD-10-CM

## 2021-09-08 NOTE — Telephone Encounter (Signed)
Informed patients wife of 9/23 PET @ 8:30 am with 8 am arrival time at [facility]- nothing to eat after midnight - patients wife voiced understanding.

## 2021-09-08 NOTE — Progress Notes (Signed)
Histology and Location of Primary Cancer: Right Lung  Location(s) of Symptomatic Metastases: Pelvis  PET: Unscheduled at this time.  CT Head 09/10/2021: Unscheduled at this time.   CT CAP 09/03/2021: Right apical 12 mm lesion is suspicious for primary bronchogenic carcinoma, given clinical history. Other smaller pulmonary nodules are indeterminate, but metastatic disease cannot be excluded. No primary malignancy or soft tissue metastasis within the abdomen or pelvis.  Right iliac lesion, as on prior MRI.  MRI L Spine 08/03/2021:  Expansile lesion in the right iliac bone is concerning for malignancy, particularly metastatic disease; correlate with any history of malignancy.  Two small foci of signal abnormality in the L2 and L3 vertebral bodies are suspicious for additional small metastases.    Biopsy of Right pelvis/iliac/ischial bone 08/27/2021   Past/Anticipated chemotherapy by medical oncology, if any:  Dr. Kale 08/19/2021 - right iliac bone expansile mass concerning for bone metastases vs myeloma vs primary bone tumor. -Neoplasm related pain is in his right posterior hip and upper thigh -L2 and L3 lesions concerning for bone metastases. --Whole-body PET CT scan to further evaluate the extent of his bone metastases and to evaluate for possible primary site for bone metastases. --CT-guided biopsy of the right iliac bone lesion requested.   Pain on a scale of 0-10 is:  Has some pain in his right posterior hip and upper thigh.   If Spine Met(s), symptoms, if any, include: Bowel/Bladder retention or incontinence (please describe): Has some constipation due to medications he takes.  Taking senna at night. Numbness or weakness in extremities (please describe): Has baseline weakness and decreased mobility due to parkinson's disease. Current Decadron regimen, if applicable: n/a  Ambulatory status? Walker? Wheelchair?: Uses rolling walker  SAFETY ISSUES: Prior radiation? No Pacemaker/ICD?  Pacer, Dr. Croitoru Possible current pregnancy? N/a Is the patient on methotrexate? No  Current Complaints / other details:     

## 2021-09-08 NOTE — Progress Notes (Signed)
Radiation Oncology         [phone] ________________________________  Name: Marcus Fuller        MRN: [id number]  Date of Service: 09/08/2021 DOB: 1941/12/08  QA:[id number], Abigail Butts, MD  Brunetta Genera, MD     REFERRING PHYSICIAN: Brunetta Genera, MD   DIAGNOSIS: The primary encounter diagnosis was Primary adenocarcinoma of upper lobe of right lung [facility]). A diagnosis of Bone metastasis ([place]) was also pertinent to this visit.   HISTORY OF PRESENT ILLNESS: Marcus Fuller is a 80 y.o. male seen at the request of Dr. Irene Limbo for a newly diagnosed metastatic adenocarcinoma of the right lung. The patient was being worked up for low back and right hip and leg pain. An MRI lumbar spine on 08/03/21 showed degenerative changes in the lumbar spine as well as small foci of signal abnormality in L2 and L3 vertebral bodies as well as a large right iliac wing mass measuring up to 5.1 cm but incompletely visualized. He underwent a CT biopsy on 08/27/21 of the iliac wing which showed findings consistent with metastatic adenocarcinoma, consistent with lung primary. A CT CAP on 09/03/21 identified a 1.2 cm nodule in the right upper lobe lung apex, and no adenopathy in the chest. He had a lipoma of the left tensor fascia lata, left iliac wing sclerotic lesion that was felt to be benign, and the right iliac destructive lesion again measuring 4.9 cm.    PREVIOUS RADIATION THERAPY: No   PAST MEDICAL HISTORY:  Past Medical History:  Diagnosis Date   Anxiety    Cataract    beginning stages   Chronic low back pain 07/03/2019   Coronary artery disease    stent- 2007    History of kidney stones    Hypertension    Parkinson's disease ([place]) 01/29/2018   Presence of permanent cardiac pacemaker    2017    Tremor of right hand        PAST SURGICAL HISTORY: Past Surgical History:  Procedure Laterality Date   APPENDECTOMY     bbb     CARDIOVERSION N/A 04/19/2021   Procedure: CARDIOVERSION;  Surgeon:  Lelon Perla, MD;  Location: [facility];  Service: Cardiovascular;  Laterality: N/A;   CHOLECYSTECTOMY N/A 05/21/2018   Procedure: LAPAROSCOPIC CHOLECYSTECTOMY;  Surgeon: Coralie Keens, MD;  Location: [facility];  Service: General;  Laterality: N/A;   left knee arthroscopy   1996   PACEMAKER INSERTION  11/2016   STENT PLACEMENT VASCULAR ([place] [facility])  2007   TONSILLECTOMY       FAMILY HISTORY:  Family History  Problem Relation Age of Onset   Heart disease Mother    Diabetes Mother    Cancer Mother        breast   Heart disease Father    Heart attack Father    Atrial fibrillation Sister    Heart disease Brother    Pancreatic disease Brother    Tremor Brother    Heart attack Paternal Grandfather    Spina bifida Brother      SOCIAL HISTORY:  reports that he has never smoked. He has never used smokeless tobacco. He reports that he does not currently use alcohol. He reports that he does not use drugs. The patient is married and lives in [place].    ALLERGIES: Atorvastatin and Codeine   MEDICATIONS:  Current Outpatient Medications  Medication Sig Dispense Refill   acetaminophen (TYLENOL) 325 MG tablet Take 650 mg by mouth every  6 (six) hours as needed.     carbidopa-levodopa (SINEMET) 25-250 MG tablet Take 1 tablet by mouth 3 (three) times daily. 270 tablet 3   digoxin (LANOXIN) 0.125 MG tablet TAKE 1 TABLET BY MOUTH EVERY DAY 90 tablet 3   ELIQUIS 5 MG TABS tablet TAKE 1 TABLET BY MOUTH TWICE A DAY 60 tablet 5   fentaNYL (DURAGESIC) 12 MCG/HR Place 1 patch onto the skin every 3 (three) days. 5 patch 0   gabapentin (NEURONTIN) 600 MG tablet 1/2 tablet in the morning and midday, 1 at night 180 tablet 3   HYDROmorphone (DILAUDID) 2 MG tablet Take 0.5 tablets (1 mg total) by mouth every 4 (four) hours as needed for severe pain. 30 tablet 0   Melatonin 10 MG TABS Take 10 mg by mouth at bedtime.     Naproxen Sodium 220 MG CAPS      OVER THE COUNTER MEDICATION Apply 1  application topically at bedtime. Outback Relief     senna-docusate (SENNA S) 8.6-50 MG tablet Take 2 tablets by mouth at bedtime. 60 tablet 1   No current facility-administered medications for this encounter.     REVIEW OF SYSTEMS: On review of systems, the patient reports that he is having tremendous pain in his right hip when standing and for the last month or two he's been using a walker to get around. He reports he has had back pain for years that has been thought to be arthritic in nature and he's see at least 3 chiropractors in the last few months. He reports he has had some fluctuation in his sensation in his feet at times between hot and cold. No other complaints are verbalized.        PHYSICAL EXAM:  Unable to assess vitals other than pain score due to encounter type.  Pain Assessment Pain Score: 8  Pain Loc: Leg (Upper right leg and thigh.)/10  In general this is a well appearing elderly caucasian male in no acute distress. He's alert and oriented x4 and appropriate throughout the examination. Cardiopulmonary assessment is negative for acute distress and he exhibits normal effort.     ECOG = 1  0 - Asymptomatic (Fully active, able to carry on all predisease activities without restriction)  1 - Symptomatic but completely ambulatory (Restricted in physically strenuous activity but ambulatory and able to carry out work of a light or sedentary nature. For example, light housework, office work)  2 - Symptomatic, <50% in bed during the day (Ambulatory and capable of all self care but unable to carry out any work activities. Up and about more than 50% of waking hours)  3 - Symptomatic, >50% in bed, but not bedbound (Capable of only limited self-care, confined to bed or chair 50% or more of waking hours)  4 - Bedbound (Completely disabled. Cannot carry on any self-care. Totally confined to bed or chair)  5 - Death   Eustace Pen MM, Creech RH, Tormey DC, et al. [phone]). "Toxicity and  response criteria of the [facility]". [place] Oncol. 5 (6): 649-55    LABORATORY DATA:  Lab Results  Component Value Date   WBC 9.4 08/19/2021   HGB 15.8 08/19/2021   HCT 48.0 08/19/2021   MCV 85.3 08/19/2021   PLT 166 08/19/2021   Lab Results  Component Value Date   NA 142 08/19/2021   K 4.2 08/19/2021   CL 107 08/19/2021   CO2 25 08/19/2021   Lab Results  Component  Value Date   ALT 12 08/19/2021   AST 30 08/19/2021   ALKPHOS 184 (H) 08/19/2021   BILITOT 1.1 08/19/2021      RADIOGRAPHY: CT CHEST ABDOMEN PELVIS W CONTRAST  Result Date: 09/06/2021 CLINICAL DATA:  Initial staging of newly diagnosed, likely metastatic lung cancer. Recent biopsy of a right iliac bone lesion. EXAM: CT CHEST, ABDOMEN, AND PELVIS WITH CONTRAST TECHNIQUE: Multidetector CT imaging of the chest, abdomen and pelvis was performed following the standard protocol during bolus administration of intravenous contrast. CONTRAST:  6mL OMNIPAQUE IOHEXOL 350 MG/ML SOLN COMPARISON:  /10/2018. FINDINGS: CT CHEST FINDINGS Cardiovascular: Pacer. Aortic atherosclerosis. Tortuous thoracic aorta. Mild cardiomegaly with 3 vessel coronary artery calcification. No central pulmonary embolism, on this non-dedicated study. Mediastinum/Nodes: No supraclavicular adenopathy. No mediastinal or hilar adenopathy. Moderate hiatal hernia, with 1/2 of the stomach in the lower chest. Lungs/Pleura: Trace right larger than left pleural effusions. Minimal motion degradation in the lower chest. Calcified and noncalcified pulmonary nodules. The largest noncalcified nodule is in the right apex with mild spiculation, including at 1.2 x 1.1 cm on 20/4. No other noncalcified nodules, including within the Peri fissural superior segment right lower lobe at 6 mm on 52/4. Musculoskeletal: No acute osseous abnormality. CT ABDOMEN PELVIS FINDINGS Hepatobiliary: Probable hepatic steatosis. Nonspecific caudate lobe enlargement. No  focal liver lesion. Cholecystectomy, without biliary ductal dilatation. Pancreas: Fatty replacement involving the pancreatic head and uncinate process. No duct dilatation or acute inflammation. Spleen: Old granulomatous disease in the spleen. Adrenals/Urinary Tract: Normal adrenal glands. Mild renal cortical thinning bilaterally. Punctate upper pole right renal collecting system calculus. Low-density bilateral renal lesions are likely cysts. There also too small to characterize lesions in both kidneys. Left-sided bladder stone of 2.0 cm. Stomach/Bowel: Normal remainder of the stomach. Normal large and small bowel loops. Vascular/Lymphatic: Aortic atherosclerosis. No abdominopelvic adenopathy. Reproductive: Moderate prostatomegaly. Other: Small bilateral fat containing inguinal hernias. No significant free fluid. Musculoskeletal: Again identified is a lipoma within the left-sided tensor fascia lata. Mild osteopenia. Left iliac wing sclerotic lesion is similar and considered benign. The right iliac destructive lesion is again identified, including at 4.9 cm on 107/2. IMPRESSION: 1. Right apical 12 mm lesion is suspicious for primary bronchogenic carcinoma, given clinical history. Other smaller pulmonary nodules are indeterminate, but metastatic disease cannot be excluded. 2. No thoracic adenopathy. 3. No primary malignancy or soft tissue metastasis within the abdomen or pelvis. 4. Right iliac lesion, as on prior MRI. 5. Tiny bilateral pleural effusions. 6. Probable hepatic steatosis and nonspecific caudate lobe enlargement. 7. Enlarging bladder stone and right nephrolithiasis. 8. Coronary artery atherosclerosis. Aortic Atherosclerosis (ICD10-I70.0). 9. Hiatal hernia Electronically Signed   By: Abigail Miyamoto M.D.   On: 09/06/2021 10:38   CT Biopsy  Result Date: 08/27/2021 CLINICAL DATA:  Lytic and destructive bone lesion involving the right posterior iliac bone and ischium. EXAM: CT GUIDED CORE BIOPSY OF RIGHT DEEP  PELVIC ILIAC/ISCHIAL BONE LESION ANESTHESIA/SEDATION: 2.0 mg IV Versed; 100 mcg IV Fentanyl Total Moderate Sedation Time:  17 minutes. The patient's level of consciousness and physiologic status were continuously monitored during the procedure by Radiology nursing. PROCEDURE: The procedure risks, benefits, and alternatives were explained to the patient. Questions regarding the procedure were encouraged and answered. The patient understands and consents to the procedure. A time-out was performed prior to initiating the procedure. CT was performed through the bony pelvis in a prone position. The right gluteal region was prepped with chlorhexidine in a sterile fashion, and a sterile drape  was applied covering the operative field. A sterile gown and sterile gloves were used for the procedure. Local anesthesia was provided with 1% Lidocaine. Under CT guidance, an 11 gauge OnControl bone biopsy needle was advanced to the posterior margin a lytic lesion centered in the right iliac bone and also extending into the right ischium. This needle was used in obtaining 2 separate core biopsy samples through the lesion. Material was submitted in formalin. COMPLICATIONS: None FINDINGS: Expansile and destructive lytic lesion of the right iliac bone and ischium measures roughly 3.4 x 5.0 cm in maximum transverse dimensions. Fragmented solid tissue was obtained from the lesion. IMPRESSION: CT-guided core biopsy performed of a lytic bone lesion involving the right iliac bone and ischium and measuring approximately 5 cm in maximum transverse diameter. Electronically Signed   By: Aletta Edouard M.D.   On: 08/27/2021 16:41       IMPRESSION/PLAN: 1. Stage IV, NSCLC, adenocarcinoma of the RUL with bone metastases. Dr. Lisbeth Renshaw discusses the pathology findings and reviews the nature of metastatic lung disease. He agrees with continued work up including PET scan and MRI brain since his ICD is compatible. Dr. Lisbeth Renshaw would offer a course of  palliative radiotherapy to the iliac wing.  Dr. Lisbeth Renshaw also discusses that pending his PET scan there may be additional considerations for local therapy to his chest as well as plans for systemic therapy. If he had widespread disease however this would not be offered. We discussed the risks, benefits, short, and long term effects of radiotherapy, as well as the palliative intent, and the patient is interested in proceeding. Dr. Lisbeth Renshaw discusses the delivery and logistics of radiotherapy and anticipates a course of 2 weeks of radiotherapy to the iliac wing. Written consent will be obtained at the time of simulation. He will receive a call from simulation staff to coordinate this appointment. 2. Painful bone metastases. The patient will continue his long and short acting narcotics and hopefully will see a clinical improvement in his pain within 2 weeks of starting radiotherapy.  3. In Situ ICD. We will reach out to Dr. Stanford Breed for permission to move forward with radiation. The site should be >10 cm from the device.  This encounter was provided by telemedicine platform MyChart.  The patient has provided two factor identification and has given verbal consent for this type of encounter and has been advised to only accept a meeting of this type in a secure network environment. The time spent during this encounter was 60 minutes including preparation, discussion, and coordination of the patient's care. The attendants for this meeting include Blenda Nicely, RN, Dr. Lisbeth Renshaw, Hayden Pedro  and [name] and his wife Allard Lightsey. During the encounter,  Blenda Nicely, RN, Dr. Lisbeth Renshaw, and Hayden Pedro were located at [facility] Radiation Oncology Department.  GUSS FARRUGGIA and his wife Suszanne Conners were located at home.    The above documentation reflects my direct findings during this shared patient visit. Please see the separate note by Dr. Lisbeth Renshaw on this date for the remainder of  the patient's plan of care.    Carola Rhine, [facility]   **Disclaimer: This note was dictated with voice recognition software. Similar sounding words can inadvertently be transcribed and this note may contain transcription errors which may not have been corrected upon publication of note.**

## 2021-09-09 ENCOUNTER — Other Ambulatory Visit: Payer: Self-pay

## 2021-09-09 MED ORDER — HYDROMORPHONE HCL 2 MG PO TABS: 2.0000 mg | ORAL_TABLET | ORAL | 0 refills | Status: DC | PRN

## 2021-09-10 ENCOUNTER — Encounter (HOSPITAL_COMMUNITY)
Admission: RE | Admit: 2021-09-10 | Discharge: 2021-09-10 | Disposition: A | Discharge status: Home / Self Care | Payer: Medicare Other | Source: Ambulatory Visit | Attending: Radiation Oncology | Admitting: Radiation Oncology

## 2021-09-10 ENCOUNTER — Other Ambulatory Visit: Payer: Self-pay | Admitting: Hematology

## 2021-09-10 ENCOUNTER — Ambulatory Visit (HOSPITAL_COMMUNITY): Payer: Medicare Other

## 2021-09-10 ENCOUNTER — Ambulatory Visit
Admission: RE | Admit: 2021-09-10 | Discharge: 2021-09-10 | Disposition: A | Discharge status: Home / Self Care | Payer: Medicare Other | Source: Ambulatory Visit | Attending: Radiation Oncology | Admitting: Radiation Oncology

## 2021-09-10 DIAGNOSIS — C7951 Secondary malignant neoplasm of bone: Secondary | ICD-10-CM | POA: Insufficient documentation

## 2021-09-10 DIAGNOSIS — C3411 Malignant neoplasm of upper lobe, right bronchus or lung: Secondary | ICD-10-CM | POA: Diagnosis not present

## 2021-09-10 DIAGNOSIS — I2510 Atherosclerotic heart disease of native coronary artery without angina pectoris: Secondary | ICD-10-CM | POA: Diagnosis not present

## 2021-09-10 DIAGNOSIS — D7389 Other diseases of spleen: Secondary | ICD-10-CM | POA: Diagnosis not present

## 2021-09-10 DIAGNOSIS — C3490 Malignant neoplasm of unspecified part of unspecified bronchus or lung: Secondary | ICD-10-CM

## 2021-09-10 DIAGNOSIS — C3491 Malignant neoplasm of unspecified part of right bronchus or lung: Secondary | ICD-10-CM | POA: Insufficient documentation

## 2021-09-10 DIAGNOSIS — K639 Disease of intestine, unspecified: Secondary | ICD-10-CM | POA: Diagnosis not present

## 2021-09-10 LAB — GLUCOSE, CAPILLARY: Glucose-Capillary: 108 mg/dL — ABNORMAL HIGH (ref 70–99)

## 2021-09-10 IMAGING — PT NM PET TUM IMG INITIAL (PI) SKULL BASE T - THIGH
1 of 7 series · 1 of 25 positions shown · non-contrast
Comparison: CT chest, and pelvis with contrast dated [DATE]

CLINICAL DATA: Initial treatment strategy for non-small-cell lung
cancer.

EXAM:
NUCLEAR MEDICINE PET SKULL BASE TO THIGH
TECHNIQUE: 9.7 mCi F-18 FDG was injected intravenously. Full-ring PET imaging
was performed from the skull base to thigh after the radiotracer. CT
data was obtained and used for attenuation correction and anatomic
localization.
Fasting blood glucose: 108 mg/dl

[Series 4: ct sk_thigh 5.0 bf37 · axial · 5.0mm · 0.98mm/px · 1 of 244 slices shown]
[im 244/244  brain]
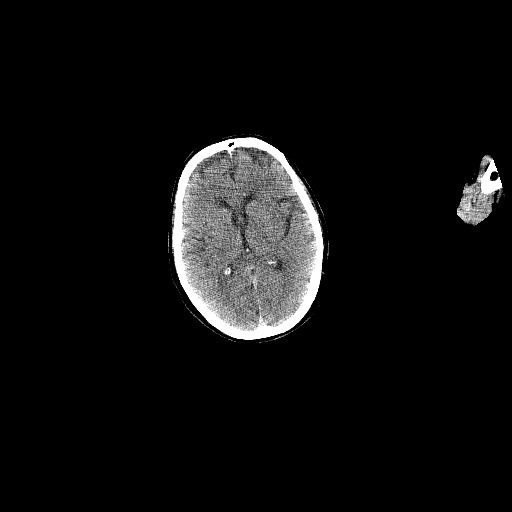

[1 of 25 positions shown; findings below may reference images not displayed]

FINDINGS: Mediastinal blood pool activity: SUV max

Liver activity: SUV max

NECK: Focal FDG uptake at the right C2-C3 facet seen on series 4,
image 25 an SUV max of 9.1. No hypermetabolic lymph nodes in the
neck

Incidental CT findings: none

CHEST: Solid nodule of the right lung apex measuring 12 mm on image
9 an SV max of 4.1. Additional small bilateral pulmonary nodules are
seen metoprolol resolution PET-CT. No hypermetabolic mediastinal or
hilar nodes.

Incidental CT findings: Cardiomegaly. No pericardial effusion. Left
chest wall pacer with leads the right atrium, right ventricle and
pain overlying the left ventricle. Three-vessel coronary artery
calcifications. Calcified mediastinal lymph nodes. Small mediastinal
lymph nodes with FDG uptake similar to mediastinal blood pool.
Atherosclerotic disease of the thoracic aorta. Moderate-sized hiatal
hernia. Small right pleural effusion with no FDG avidity.

ABDOMEN/PELVIS: No abnormal hypermetabolic activity within the
liver, pancreas, adrenal glands, or spleen. No hypermetabolic lymph
nodes in the abdomen or pelvis.

Incidental CT findings: Goals cystectomy clips. No biliary ductal
dilation. Fatty atrophy of the pancreas. Calcified nodules in the
spleen, likely sequela of prior granulomatous infection. Bilateral
adrenal glands are unremarkable. Punctate nonobstructing stone of
the lower pole the right kidney. Bilateral simple renal cysts.
Unchanged bladder stone. Prostatomegaly measuring up to 3 cm. No
evidence of bowel wall thickening or obstruction. Calcified plaque
of the abdominal aorta.

SKELETON: Large hypermetabolic lytic lesion of the right iliac bone
with an SUV max of 11.8. Additional smaller hypermetabolic lesion is
seen in the right iliac wing on image 165 with an SUV max of 4.2.
Lytic hypermetabolic lesions of the posterior right acetabulum with
SUV max of 8.4.

Incidental CT findings: none
IMPRESSION: Hypermetabolic solid nodule of the right lung apex measuring 12 mm
compatible with primary lung malignancy.

Hypermetabolic lytic lesions of the right ilium concerning for
osseous metastatic disease.

Hypermetabolic focus which correlates with the right C2-C3 facet
with no lytic lesion visualized, possibly due to degenerative
disease. Could be further evaluated with MRI of the cervical spine
if this would change clinical management.

## 2021-09-10 MED ORDER — FLUDEOXYGLUCOSE F - 18 (FDG) INJECTION
10.1000 | Freq: Once | INTRAVENOUS | Status: AC
  Administered 2021-09-10: 9.75 via INTRAVENOUS

## 2021-09-11 ENCOUNTER — Inpatient Hospital Stay: Payer: Medicare Other

## 2021-09-12 ENCOUNTER — Encounter: Payer: Self-pay | Admitting: Hematology

## 2021-09-12 NOTE — Progress Notes (Signed)
Marcus Fuller   HEMATOLOGY/ONCOLOGY CLINIC NOTE  Date of Service: 09/12/2021  Patient Care Team: Cari Caraway, MD as PCP - General (Family Medicine) Sanda Klein, MD as PCP - Cardiology (Cardiology) Jola Baptist, DC as Referring Physician (Chiropractic Medicine)  CHIEF COMPLAINTS/PURPOSE OF CONSULTATION:  Newly diagnosed metastatic lung adenocarcinoma with bone mets  HISTORY OF PRESENTING ILLNESS:   Marcus Fuller is a wonderful 80 y.o. male who has been referred to [name] by Dr Eunice Blase for evaluation and management of newly noted pelvic mass/bone metastases.  Patient has a history of hypertension, Parkinson's disease [follows with Dr. Juanda Crumble Willis], coronary artery disease status post PCI in 2007, history of kidney stones, presence of permanent cardiac pacemaker who has history of chronic low back pain. Patient notes that over the last couple of months he has had increasing low back pain had an MRI of the lumbar spine without contrast ordered by Dr. Junius Roads on 08/03/2021.  MRI lumbar spine showed  1. Expansile lesion in the right iliac bone is concerning for malignancy, particularly metastatic disease; correlate with any history of malignancy. If there is no history of malignancy, oncologic workup and dedicated MRI of the hip with and without contrast are recommended for further evaluation. 2. Two small foci of signal abnormality in the L2 and L3 vertebral bodies are suspicious for additional small metastases. MRI of the lumbar spine with and without contrast may be helpful for better characterization. 3. Mild multilevel degenerative changes of the lumbar spine detailed above resulting in up to moderate right neural foraminal stenosis at L4-L5. Otherwise, no high-grade spinal canal or neural foraminal stenosis. 4. Trace free fluid in the abdomen, nonspecific.  Patient notes some pain on the right posterior hip and upper thigh.  No pain radiating into his lower extremities.  No  lower extremity weakness that is new.  No loss of bowel or bladder control.  Patient does note chronic weakness decreased mobility due to has Parkinson's disease.  He is on Sinemet for this.  He notes that at baseline he uses a rolling walker but has had difficulty lifting his right leg due to pain.  He has had chronic low back pain in the past and has been seen by Dr. Herma Mering at [facility] and has previously received 2 injections in his spine for pain management.  He also has a history of chronic atrial fibrillation anticoagulation and digoxin.  Patient notes no significant weight loss in the last 3 months. . Wt Readings from Last 3 Encounters:  09/08/21 195 lb (88.5 kg)  08/19/21 197 lb 8 oz (89.6 kg)  08/16/21 195 lb 12.8 oz (88.8 kg)   No other focal neurological deficits no headaches. No shortness of breath or new chest pain. No change in urinary or bowel habits. No previous history of any cancers.   INTERVAL HISTORY  Marcus Fuller is here with his wife for follow-up to discuss the results of his biopsy and imaging studies.  CT chest abdomen pelvis showed IMPRESSION: 1. Right apical 12 mm lesion is suspicious for primary bronchogenic carcinoma, given clinical history. Other smaller pulmonary nodules are indeterminate, but metastatic disease cannot be excluded. 2. No thoracic adenopathy. 3. No primary malignancy or soft tissue metastasis within the abdomen or pelvis. 4. Right iliac lesion, as on prior MRI. 5. Tiny bilateral pleural effusions. 6. Probable hepatic steatosis and nonspecific caudate lobe enlargement. 7. Enlarging bladder stone and right nephrolithiasis. 8. Coronary artery atherosclerosis. Aortic Atherosclerosis (ICD10-I70.0). 9. Hiatal hernia  CT-guided biopsy of his  iliac lesion is consistent with metastatic lung adenocarcinoma.  Notes significant right hip and thigh pain. No shortness of breath no cough no fever no chills.   MEDICAL HISTORY:  Past  Medical History:  Diagnosis Date   Anxiety    Cataract    beginning stages   Chronic low back pain 07/03/2019   Coronary artery disease    stent- 2007    History of kidney stones    Hypertension    Parkinson's disease ([place]) 01/29/2018   Presence of permanent cardiac pacemaker    2017    Tremor of right hand     SURGICAL HISTORY: Past Surgical History:  Procedure Laterality Date   APPENDECTOMY     bbb     CARDIOVERSION N/A 04/19/2021   Procedure: CARDIOVERSION;  Surgeon: Lelon Perla, MD;  Location: [facility];  Service: Cardiovascular;  Laterality: N/A;   CHOLECYSTECTOMY N/A 05/21/2018   Procedure: LAPAROSCOPIC CHOLECYSTECTOMY;  Surgeon: Coralie Keens, MD;  Location: [facility];  Service: General;  Laterality: N/A;   left knee arthroscopy   1996   PACEMAKER INSERTION  11/2016   STENT PLACEMENT VASCULAR ([place] [facility])  2007   TONSILLECTOMY      SOCIAL HISTORY: Social History   Socioeconomic History   Marital status: Married    Spouse name: Marcus Fuller   Number of children: 2   Years of education: 14   Highest education level: Not on file  Occupational History   Not on file  Tobacco Use   Smoking status: Never   Smokeless tobacco: Never  Vaping Use   Vaping Use: Never used  Substance and Sexual Activity   Alcohol use: Not Currently    Comment: social    Drug use: No   Sexual activity: Not Currently  Other Topics Concern   Not on file  Social History Narrative   Lives w/ wife   Caffeine use: sometimes   Right handed    Social Determinants of Health   Financial Resource Strain: Not on file  Food Insecurity: Not on file  Transportation Needs: Not on file  Physical Activity: Not on file  Stress: Not on file  Social Connections: Not on file  Intimate Partner Violence: Not on file    FAMILY HISTORY: Family History  Problem Relation Age of Onset   Heart disease Mother    Diabetes Mother    Cancer Mother        breast   Heart disease Father    Heart  attack Father    Atrial fibrillation Sister    Heart disease Brother    Pancreatic disease Brother    Tremor Brother    Heart attack Paternal Grandfather    Spina bifida Brother     ALLERGIES:  is allergic to atorvastatin and codeine.  MEDICATIONS:  Current Outpatient Medications  Medication Sig Dispense Refill   fentaNYL (DURAGESIC) 12 MCG/HR Place 1 patch onto the skin every 3 (three) days. 5 patch 0   acetaminophen (TYLENOL) 325 MG tablet Take 650 mg by mouth every 6 (six) hours as needed.     carbidopa-levodopa (SINEMET) 25-250 MG tablet Take 1 tablet by mouth 3 (three) times daily. 270 tablet 3   digoxin (LANOXIN) 0.125 MG tablet TAKE 1 TABLET BY MOUTH EVERY DAY 90 tablet 3   ELIQUIS 5 MG TABS tablet TAKE 1 TABLET BY MOUTH TWICE A DAY 60 tablet 5   gabapentin (NEURONTIN) 600 MG tablet 1/2 tablet in the morning and midday, 1 at night 180  tablet 3   HYDROmorphone (DILAUDID) 2 MG tablet Take 1 tablet (2 mg total) by mouth every 4 (four) hours as needed for severe pain. 30 tablet 0   Melatonin 10 MG TABS Take 10 mg by mouth at bedtime.     Naproxen Sodium 220 MG CAPS      OVER THE COUNTER MEDICATION Apply 1 application topically at bedtime. Outback Relief     senna-docusate (SENNA S) 8.6-50 MG tablet Take 2 tablets by mouth at bedtime. 60 tablet 1   No current facility-administered medications for this visit.    REVIEW OF SYSTEMS:    .10 Point review of Systems was done is negative except as noted above.   PHYSICAL EXAMINATION: ECOG PERFORMANCE STATUS: 3 - Symptomatic, >50% confined to bed  . Vitals:   09/06/21 1522  BP: 127/71  Pulse: 65  Resp: 17  Temp: 97.7 F (36.5 C)  SpO2: 98%   Filed Weights   .There is no height or weight on file to calculate BMI. Marcus Fuller GENERAL:alert, in no acute distress and comfortable SKIN: no acute rashes, no significant lesions EYES: conjunctiva are pink and non-injected, sclera anicteric OROPHARYNX: MMM, no exudates, no oropharyngeal  erythema or ulceration NECK: supple, no JVD LYMPH:  no palpable lymphadenopathy in the cervical, axillary or inguinal regions LUNGS: clear to auscultation b/l with normal respiratory effort HEART: regular rate & rhythm ABDOMEN:  normoactive bowel sounds , non tender, not distended. Extremity: no pedal edema PSYCH: alert & oriented x 3 with fluent speech NEURO: no focal motor/sensory deficits  LABORATORY DATA:  I have reviewed the data as listed  . CBC Latest Ref Rng & Units 08/19/2021 04/06/2021 12/28/2018  WBC 4.0 - 10.5 K/uL 9.4 9.0 9.5  Hemoglobin 13.0 - 17.0 g/dL 15.8 16.0 15.5  Hematocrit 39.0 - 52.0 % 48.0 50.7 46.3  Platelets 150 - 400 K/uL 166 161 165    . CMP Latest Ref Rng & Units 08/19/2021 04/06/2021 07/06/2020  Glucose 70 - 99 mg/dL 110(H) 133(H) 87  BUN 8 - 23 mg/dL '13 9 16  ' Creatinine 0.61 - 1.24 mg/dL 0.86 0.99 0.99  Sodium 135 - 145 mmol/L 142 140 141  Potassium 3.5 - 5.1 mmol/L 4.2 4.1 4.5  Chloride 98 - 111 mmol/L 107 105 103  CO2 22 - 32 mmol/L '25 29 23  ' Calcium 8.9 - 10.3 mg/dL 10.1 9.8 9.7  Total Protein 6.5 - 8.1 g/dL 6.4(L) - -  Total Bilirubin 0.3 - 1.2 mg/dL 1.1 - -  Alkaline Phos 38 - 126 U/L 184(H) - -  AST 15 - 41 U/L 30 - -  ALT 0 - 44 U/L 12 - -    SURGICAL PATHOLOGY  CASE: [id number]  PATIENT: Marcus Fuller  Surgical Pathology Report      Clinical History: Painful right pelvic bone lesion at juncture of iliac  bone and ischium. Elevated free kappa light chains. 5 cm right  iliac/ischial expansile lytic bone lesion. Possible plasmacytoma vs  metastatic lesion vs benign lytic lesion. (crm)      FINAL MICROSCOPIC DIAGNOSIS:  A. BONE, RIGHT PELVIS, ILIAC/ISCHIAL LYTIC, BIOPSY:  - Metastatic adenocarcinoma consistent with lung primary.  - See comment.    RADIOGRAPHIC STUDIES: I have personally reviewed the radiological images as listed and agreed with the findings in the report. CT CHEST ABDOMEN PELVIS W CONTRAST  Result Date:  09/06/2021 CLINICAL DATA:  Initial staging of newly diagnosed, likely metastatic lung cancer. Recent biopsy of a right iliac bone lesion. EXAM:  CT CHEST, ABDOMEN, AND PELVIS WITH CONTRAST TECHNIQUE: Multidetector CT imaging of the chest, abdomen and pelvis was performed following the standard protocol during bolus administration of intravenous contrast. CONTRAST:  41m OMNIPAQUE IOHEXOL 350 MG/ML SOLN COMPARISON:  /10/2018. FINDINGS: CT CHEST FINDINGS Cardiovascular: Pacer. Aortic atherosclerosis. Tortuous thoracic aorta. Mild cardiomegaly with 3 vessel coronary artery calcification. No central pulmonary embolism, on this non-dedicated study. Mediastinum/Nodes: No supraclavicular adenopathy. No mediastinal or hilar adenopathy. Moderate hiatal hernia, with 1/2 of the stomach in the lower chest. Lungs/Pleura: Trace right larger than left pleural effusions. Minimal motion degradation in the lower chest. Calcified and noncalcified pulmonary nodules. The largest noncalcified nodule is in the right apex with mild spiculation, including at 1.2 x 1.1 cm on 20/4. No other noncalcified nodules, including within the Peri fissural superior segment right lower lobe at 6 mm on 52/4. Musculoskeletal: No acute osseous abnormality. CT ABDOMEN PELVIS FINDINGS Hepatobiliary: Probable hepatic steatosis. Nonspecific caudate lobe enlargement. No focal liver lesion. Cholecystectomy, without biliary ductal dilatation. Pancreas: Fatty replacement involving the pancreatic head and uncinate process. No duct dilatation or acute inflammation. Spleen: Old granulomatous disease in the spleen. Adrenals/Urinary Tract: Normal adrenal glands. Mild renal cortical thinning bilaterally. Punctate upper pole right renal collecting system calculus. Low-density bilateral renal lesions are likely cysts. There also too small to characterize lesions in both kidneys. Left-sided bladder stone of 2.0 cm. Stomach/Bowel: Normal remainder of the stomach. Normal  large and small bowel loops. Vascular/Lymphatic: Aortic atherosclerosis. No abdominopelvic adenopathy. Reproductive: Moderate prostatomegaly. Other: Small bilateral fat containing inguinal hernias. No significant free fluid. Musculoskeletal: Again identified is a lipoma within the left-sided tensor fascia lata. Mild osteopenia. Left iliac wing sclerotic lesion is similar and considered benign. The right iliac destructive lesion is again identified, including at 4.9 cm on 107/2. IMPRESSION: 1. Right apical 12 mm lesion is suspicious for primary bronchogenic carcinoma, given clinical history. Other smaller pulmonary nodules are indeterminate, but metastatic disease cannot be excluded. 2. No thoracic adenopathy. 3. No primary malignancy or soft tissue metastasis within the abdomen or pelvis. 4. Right iliac lesion, as on prior MRI. 5. Tiny bilateral pleural effusions. 6. Probable hepatic steatosis and nonspecific caudate lobe enlargement. 7. Enlarging bladder stone and right nephrolithiasis. 8. Coronary artery atherosclerosis. Aortic Atherosclerosis (ICD10-I70.0). 9. Hiatal hernia Electronically Signed   By: KAbigail MiyamotoM.D.   On: 09/06/2021 10:38   NM PET Image Initial (PI) Skull Base To Thigh  Result Date: 09/10/2021 CLINICAL DATA:  Initial treatment strategy for non-small-cell lung cancer. EXAM: NUCLEAR MEDICINE PET SKULL BASE TO THIGH TECHNIQUE: 9.7 mCi F-18 FDG was injected intravenously. Full-ring PET imaging was performed from the skull base to thigh after the radiotracer. CT data was obtained and used for attenuation correction and anatomic localization. Fasting blood glucose: 108 mg/dl COMPARISON:  CT chest, and pelvis with contrast dated September 03, 2021 FINDINGS: Mediastinal blood pool activity: SUV max 2.5 Liver activity: SUV max 3.6 NECK: Focal FDG uptake at the right C2-C3 facet seen on series 4, image 25 an SUV max of 9.1. No hypermetabolic lymph nodes in the neck Incidental CT findings: none  CHEST: Solid nodule of the right lung apex measuring 12 mm on image 9 an SV max of 4.1. Additional small bilateral pulmonary nodules are seen metoprolol resolution PET-CT. No hypermetabolic mediastinal or hilar nodes. Incidental CT findings: Cardiomegaly. No pericardial effusion. Left chest wall pacer with leads the right atrium, right ventricle and pain overlying the left ventricle. Three-vessel coronary artery calcifications. Calcified mediastinal  lymph nodes. Small mediastinal lymph nodes with FDG uptake similar to mediastinal blood pool. Atherosclerotic disease of the thoracic aorta. Moderate-sized hiatal hernia. Small right pleural effusion with no FDG avidity. ABDOMEN/PELVIS: No abnormal hypermetabolic activity within the liver, pancreas, adrenal glands, or spleen. No hypermetabolic lymph nodes in the abdomen or pelvis. Incidental CT findings: Goals cystectomy clips. No biliary ductal dilation. Fatty atrophy of the pancreas. Calcified nodules in the spleen, likely sequela of prior granulomatous infection. Bilateral adrenal glands are unremarkable. Punctate nonobstructing stone of the lower pole the right kidney. Bilateral simple renal cysts. Unchanged bladder stone. Prostatomegaly measuring up to 3 cm. No evidence of bowel wall thickening or obstruction. Calcified plaque of the abdominal aorta. SKELETON: Large hypermetabolic lytic lesion of the right iliac bone with an SUV max of 11.8. Additional smaller hypermetabolic lesion is seen in the right iliac wing on image 165 with an SUV max of 4.2. Lytic hypermetabolic lesions of the posterior right acetabulum with SUV max of 8.4. Incidental CT findings: none IMPRESSION: Hypermetabolic solid nodule of the right lung apex measuring 12 mm compatible with primary lung malignancy. Hypermetabolic lytic lesions of the right ilium concerning for osseous metastatic disease. Hypermetabolic focus which correlates with the right C2-C3 facet with no lytic lesion visualized,  possibly due to degenerative disease. Could be further evaluated with MRI of the cervical spine if this would change clinical management. Electronically Signed   By: Yetta Glassman M.D.   On: 09/10/2021 11:04   CT Biopsy  Result Date: 08/27/2021 CLINICAL DATA:  Lytic and destructive bone lesion involving the right posterior iliac bone and ischium. EXAM: CT GUIDED CORE BIOPSY OF RIGHT DEEP PELVIC ILIAC/ISCHIAL BONE LESION ANESTHESIA/SEDATION: 2.0 mg IV Versed; 100 mcg IV Fentanyl Total Moderate Sedation Time:  17 minutes. The patient's level of consciousness and physiologic status were continuously monitored during the procedure by Radiology nursing. PROCEDURE: The procedure risks, benefits, and alternatives were explained to the patient. Questions regarding the procedure were encouraged and answered. The patient understands and consents to the procedure. A time-out was performed prior to initiating the procedure. CT was performed through the bony pelvis in a prone position. The right gluteal region was prepped with chlorhexidine in a sterile fashion, and a sterile drape was applied covering the operative field. A sterile gown and sterile gloves were used for the procedure. Local anesthesia was provided with 1% Lidocaine. Under CT guidance, an 11 gauge OnControl bone biopsy needle was advanced to the posterior margin a lytic lesion centered in the right iliac bone and also extending into the right ischium. This needle was used in obtaining 2 separate core biopsy samples through the lesion. Material was submitted in formalin. COMPLICATIONS: None FINDINGS: Expansile and destructive lytic lesion of the right iliac bone and ischium measures roughly 3.4 x 5.0 cm in maximum transverse dimensions. Fragmented solid tissue was obtained from the lesion. IMPRESSION: CT-guided core biopsy performed of a lytic bone lesion involving the right iliac bone and ischium and measuring approximately 5 cm in maximum transverse  diameter. Electronically Signed   By: Aletta Edouard M.D.   On: 08/27/2021 16:41    ASSESSMENT & PLAN:   80 year old male with history of Parkinson's disease, coronary artery disease, atrial fibrillation on anticoagulation with  1) newly diagnosed metastatic lung adenocarcinoma with bone mets Neoplasm related pain is in his right posterior hip and upper thigh 2) L2 and L3 lesions concerning for bone metastases. 3) history of Parkinson's disease on Sinemet follows with Dr. Juanda Crumble  Willis 4) coronary artery disease status post PCI 2007 atrial fibrillation status post pacemaker placement on anticoagulation and digoxin follows with Dr. Sallyanne Kuster 5) hypertension 6) dyslipidemia PLAN -We discussed all available lab results. -The new diagnosis of metastatic lung adenocarcinoma, natural history, staging, prognosis, typical treatment options were discussed in detail with the patient. -Still awaiting PD-L1 testing results and foundation 1 results to determine options for immunotherapy and other targeted therapies. -Patient is in the best candidate for palliative chemotherapy on account of his performance status. -We will refer to radiation oncology for consideration of palliative radiation to his painful right iliac lesion.  Radiation oncology could also potentially consider SRS of his primary lung lesion.  He does not seem to have any other organ or life limiting lesions at this time. -We will get a CT of the head to complete staging.  He does have a pacemaker which is MRI partially compatible but will hold off on MRI at this time. -Discussed starting the patient on bone directed therapies due to his symptomatic bone metastasis to reduce the risk of skeletal related events. -All the patient's and his family's questions were answered in details. -He is aware this is not a curable condition.  Follow-up  L-Referral to radiation oncology for consideration of palliative radiation to painful bulky right  iliac metastasis from newly diagnosed metastatic lung adenocarcinoma.  Consideration of palliative SBRT to right upper lobe lung primary. -Please schedule to start Zometa every 4 weeks starting in 5 days -CT of the head in 5 days -Return to clinic with Dr. Irene Limbo in 10 days with labs.  Orders Placed This Encounter  Procedures   CT HEAD W & WO CONTRAST (5MM)    Standing Status:   Future    Standing Expiration Date:   09/06/2022    Order Specific Question:   If indicated for the ordered procedure, I authorize the administration of contrast media per Radiology protocol    Answer:   Yes    Order Specific Question:   Preferred imaging location?    Answer:   [facility]   Ambulatory referral to Radiation Oncology    Referral Priority:   Urgent    Referral Type:   Consultation    Referral Reason:   Specialty Services Required    Requested Specialty:   Radiation Oncology    Number of Visits Requested:   1     All of the patients questions were answered with apparent satisfaction. The patient knows to call the clinic with any problems, questions or concerns.  . The total time spent in the appointment was 60 minutes and more than 50% was on counseling and direct patient cares.     Sullivan Lone MD [place] AAHIVMS [facility] Hematology/Oncology Physician [facility]  (Office):       [phone] (Work cell):  [phone] (Fax):           [phone]

## 2021-09-13 ENCOUNTER — Ambulatory Visit (HOSPITAL_COMMUNITY)
Admission: RE | Admit: 2021-09-13 | Discharge: 2021-09-13 | Disposition: A | Discharge status: Home / Self Care | Payer: Medicare Other | Source: Ambulatory Visit | Attending: Radiation Oncology | Admitting: Radiation Oncology

## 2021-09-13 ENCOUNTER — Inpatient Hospital Stay: Payer: Medicare Other

## 2021-09-13 ENCOUNTER — Other Ambulatory Visit: Payer: Self-pay

## 2021-09-13 ENCOUNTER — Telehealth: Payer: Self-pay | Admitting: Radiation Oncology

## 2021-09-13 DIAGNOSIS — I482 Chronic atrial fibrillation, unspecified: Secondary | ICD-10-CM | POA: Diagnosis not present

## 2021-09-13 DIAGNOSIS — M79651 Pain in right thigh: Secondary | ICD-10-CM | POA: Diagnosis not present

## 2021-09-13 DIAGNOSIS — C3490 Malignant neoplasm of unspecified part of unspecified bronchus or lung: Secondary | ICD-10-CM | POA: Diagnosis not present

## 2021-09-13 DIAGNOSIS — I10 Essential (primary) hypertension: Secondary | ICD-10-CM | POA: Diagnosis not present

## 2021-09-13 DIAGNOSIS — M8580 Other specified disorders of bone density and structure, unspecified site: Secondary | ICD-10-CM | POA: Diagnosis not present

## 2021-09-13 DIAGNOSIS — K449 Diaphragmatic hernia without obstruction or gangrene: Secondary | ICD-10-CM | POA: Diagnosis not present

## 2021-09-13 DIAGNOSIS — E785 Hyperlipidemia, unspecified: Secondary | ICD-10-CM | POA: Diagnosis not present

## 2021-09-13 DIAGNOSIS — N200 Calculus of kidney: Secondary | ICD-10-CM | POA: Diagnosis not present

## 2021-09-13 DIAGNOSIS — G319 Degenerative disease of nervous system, unspecified: Secondary | ICD-10-CM | POA: Diagnosis not present

## 2021-09-13 DIAGNOSIS — M47816 Spondylosis without myelopathy or radiculopathy, lumbar region: Secondary | ICD-10-CM | POA: Diagnosis not present

## 2021-09-13 DIAGNOSIS — C7951 Secondary malignant neoplasm of bone: Secondary | ICD-10-CM | POA: Diagnosis not present

## 2021-09-13 DIAGNOSIS — C7931 Secondary malignant neoplasm of brain: Secondary | ICD-10-CM | POA: Diagnosis not present

## 2021-09-13 DIAGNOSIS — C801 Malignant (primary) neoplasm, unspecified: Secondary | ICD-10-CM | POA: Diagnosis not present

## 2021-09-13 DIAGNOSIS — G893 Neoplasm related pain (acute) (chronic): Secondary | ICD-10-CM | POA: Diagnosis not present

## 2021-09-13 DIAGNOSIS — G20 Parkinson's disease: Secondary | ICD-10-CM | POA: Diagnosis not present

## 2021-09-13 DIAGNOSIS — C3411 Malignant neoplasm of upper lobe, right bronchus or lung: Secondary | ICD-10-CM

## 2021-09-13 DIAGNOSIS — I119 Hypertensive heart disease without heart failure: Secondary | ICD-10-CM | POA: Diagnosis not present

## 2021-09-13 DIAGNOSIS — C3491 Malignant neoplasm of unspecified part of right bronchus or lung: Secondary | ICD-10-CM

## 2021-09-13 DIAGNOSIS — N210 Calculus in bladder: Secondary | ICD-10-CM | POA: Diagnosis not present

## 2021-09-13 DIAGNOSIS — D329 Benign neoplasm of meninges, unspecified: Secondary | ICD-10-CM | POA: Diagnosis not present

## 2021-09-13 DIAGNOSIS — I2510 Atherosclerotic heart disease of native coronary artery without angina pectoris: Secondary | ICD-10-CM | POA: Diagnosis not present

## 2021-09-13 DIAGNOSIS — R531 Weakness: Secondary | ICD-10-CM | POA: Diagnosis not present

## 2021-09-13 DIAGNOSIS — N281 Cyst of kidney, acquired: Secondary | ICD-10-CM | POA: Diagnosis not present

## 2021-09-13 DIAGNOSIS — J90 Pleural effusion, not elsewhere classified: Secondary | ICD-10-CM | POA: Diagnosis not present

## 2021-09-13 DIAGNOSIS — I700 Atherosclerosis of aorta: Secondary | ICD-10-CM | POA: Diagnosis not present

## 2021-09-13 DIAGNOSIS — K4020 Bilateral inguinal hernia, without obstruction or gangrene, not specified as recurrent: Secondary | ICD-10-CM | POA: Diagnosis not present

## 2021-09-13 DIAGNOSIS — M48061 Spinal stenosis, lumbar region without neurogenic claudication: Secondary | ICD-10-CM | POA: Diagnosis not present

## 2021-09-13 DIAGNOSIS — G9389 Other specified disorders of brain: Secondary | ICD-10-CM | POA: Diagnosis not present

## 2021-09-13 LAB — CMP (CANCER CENTER ONLY)
ALT: 8 U/L (ref 0–44)
AST: 29 U/L (ref 15–41)
Albumin: 3.8 g/dL (ref 3.5–5.0)
Alkaline Phosphatase: 306 U/L — ABNORMAL HIGH (ref 38–126)
Anion gap: 11 (ref 5–15)
BUN: 17 mg/dL (ref 8–23)
CO2: 24 mmol/L (ref 22–32)
Calcium: 10.5 mg/dL — ABNORMAL HIGH (ref 8.9–10.3)
Chloride: 105 mmol/L (ref 98–111)
Creatinine: 0.84 mg/dL (ref 0.61–1.24)
GFR, Estimated: >60 mL/min (ref >60)
Glucose, Bld: 112 mg/dL — ABNORMAL HIGH (ref 70–99)
Potassium: 4.1 mmol/L (ref 3.5–5.1)
Sodium: 140 mmol/L (ref 135–145)
Total Bilirubin: 1.2 mg/dL (ref 0.3–1.2)
Total Protein: 6.7 g/dL (ref 6.5–8.1)

## 2021-09-13 LAB — CBC WITH DIFFERENTIAL (CANCER CENTER ONLY)
Abs Immature Granulocytes: 0.05 10*3/uL (ref 0.00–0.07)
Basophils Absolute: 0.1 10*3/uL (ref 0.0–0.1)
Basophils Relative: 1 %
Eosinophils Absolute: 0.1 10*3/uL (ref 0.0–0.5)
Eosinophils Relative: 1 %
HCT: 49.8 % (ref 39.0–52.0)
Hemoglobin: 15.8 g/dL (ref 13.0–17.0)
Immature Granulocytes: 1 %
Lymphocytes Relative: 19 %
Lymphs Abs: 1.8 10*3/uL (ref 0.7–4.0)
MCH: 28.2 pg (ref 26.0–34.0)
MCHC: 31.7 g/dL (ref 30.0–36.0)
MCV: 88.8 fL (ref 80.0–100.0)
Monocytes Absolute: 0.8 10*3/uL (ref 0.1–1.0)
Monocytes Relative: 8 %
Neutro Abs: 6.8 10*3/uL (ref 1.7–7.7)
Neutrophils Relative %: 70 %
Platelet Count: 188 10*3/uL (ref 150–400)
RBC: 5.61 MIL/uL (ref 4.22–5.81)
RDW: 14.6 % (ref 11.5–15.5)
WBC Count: 9.6 10*3/uL (ref 4.0–10.5)
nRBC: 0 % (ref 0.0–0.2)

## 2021-09-13 LAB — LACTATE DEHYDROGENASE: LDH: 204 U/L — ABNORMAL HIGH (ref 98–192)

## 2021-09-13 IMAGING — MR MR HEAD WO/W CM
10 of 13 series · 33 of 48 positions shown · IV contrast (gadavist)
Comparison: None.

CLINICAL DATA: Non-small-cell lung cancer, staging

EXAM:
MRI HEAD WITHOUT AND WITH CONTRAST
TECHNIQUE: Multiplanar, multiecho pulse sequences of the brain and surrounding
structures were obtained without and with intravenous contrast.
CONTRAST:  8mL GADAVIST GADOBUTROL 1 MMOL/ML IV SOLN

[Series 4: DWI · axial · 3.0mm · 1.09mm/px · z∈[-65,+76]mm · 7 of 98 slices shown (1 of 4)]
[im 1/98]
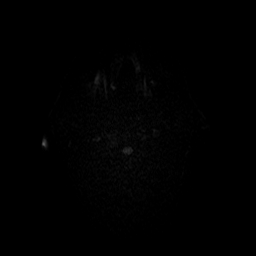
[im 17/98]
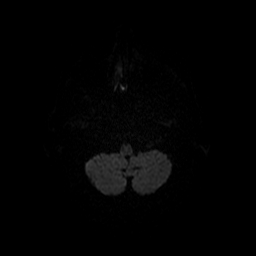
[im 33/98]
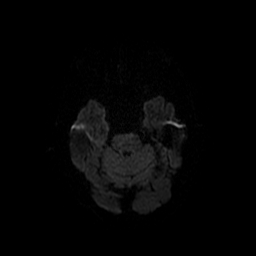
[im 49/98]
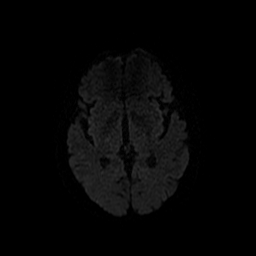
[im 65/98]
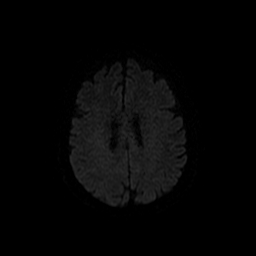
[im 81/98]
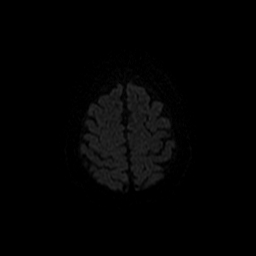
[im 98/98]
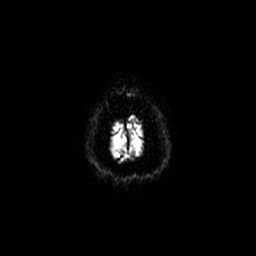

[Series 5: DWI · coronal · 5.0mm · 1.09mm/px · 6 of 78 slices shown (2 of 4)]
[im 1/78]
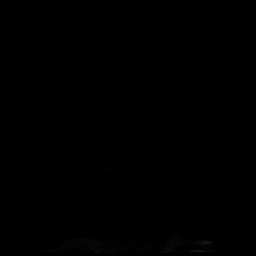
[im 16/78]
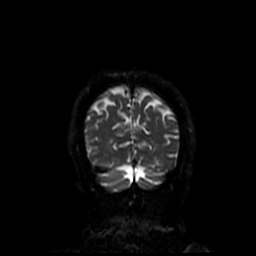
[im 31/78]
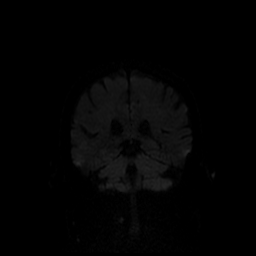
[im 47/78]
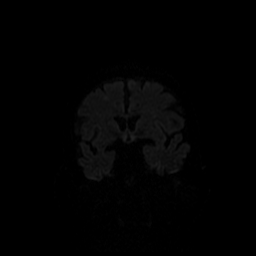
[im 62/78]
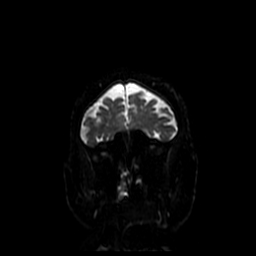
[im 78/78]
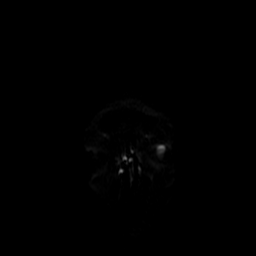

[Series 6: T1 · sagittal · 5.0mm · 0.47mm/px · 2 of 24 slices shown (1 of 2)]
[im 1/24]
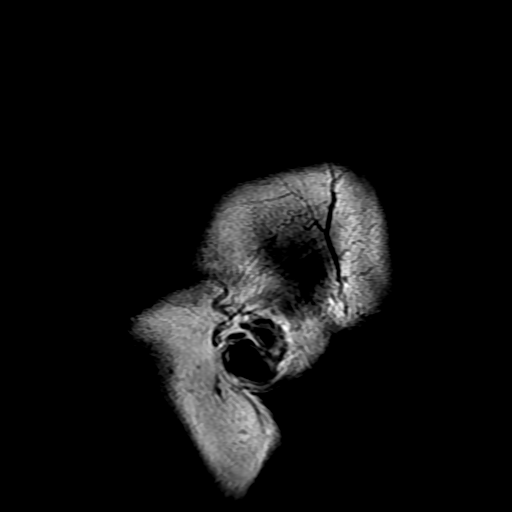
[im 24/24]
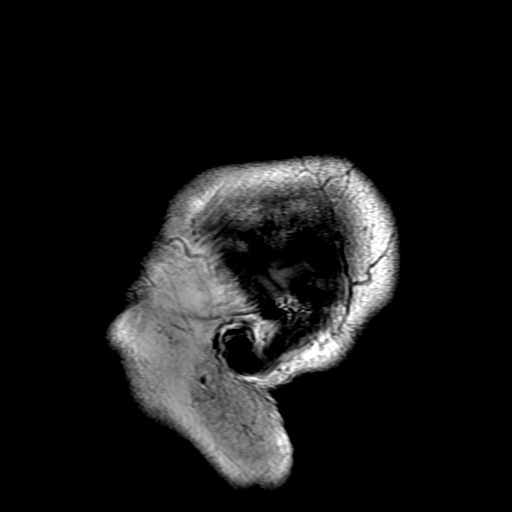

[Series 7: T2 · axial · 5.0mm · 0.43mm/px · z∈[-74,+74]mm · 2 of 26 slices shown (1 of 2)]
[im 1/26]
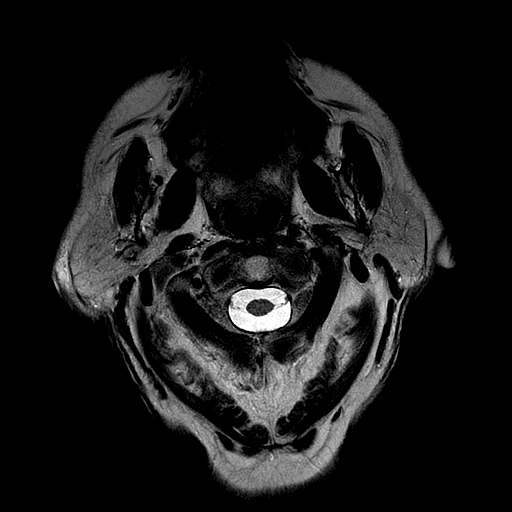
[im 26/26]
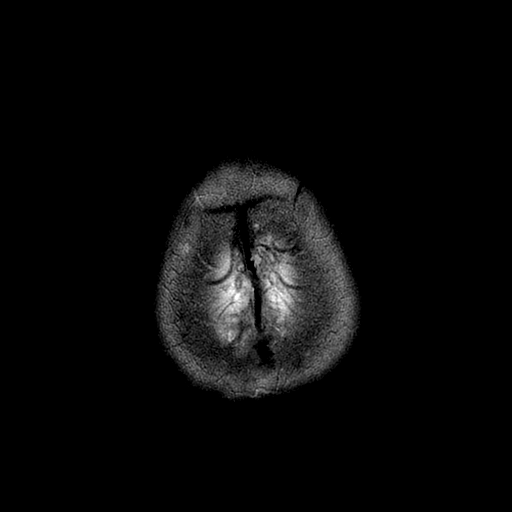

[Series 8: FLAIR · axial · 3.0mm · 0.43mm/px · z∈[-74,+74]mm · 2 of 26 slices shown]
[im 1/26]
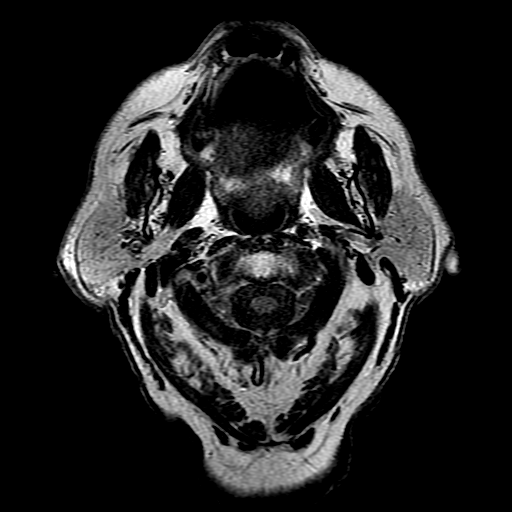
[im 26/26]
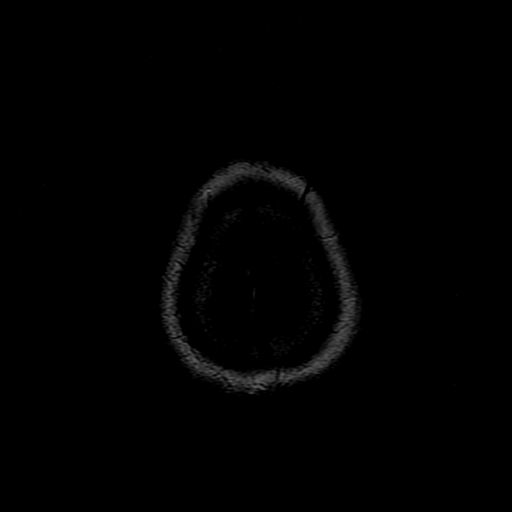

[Series 10: T1 · axial · 3.0mm · 0.47mm/px · z∈[-64,-15]mm · 3 of 100 slices shown (2 of 2)]
[im 1/100]
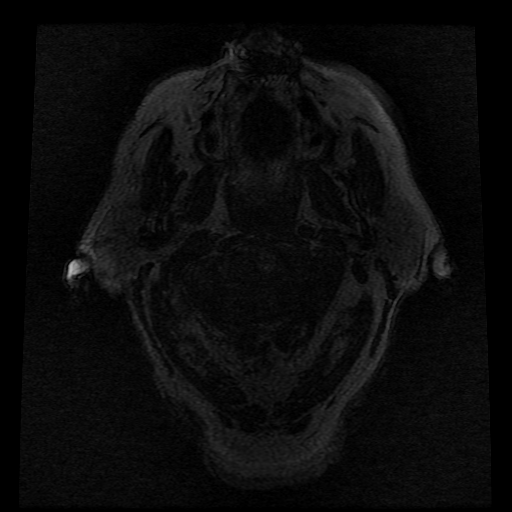
[im 17/100]
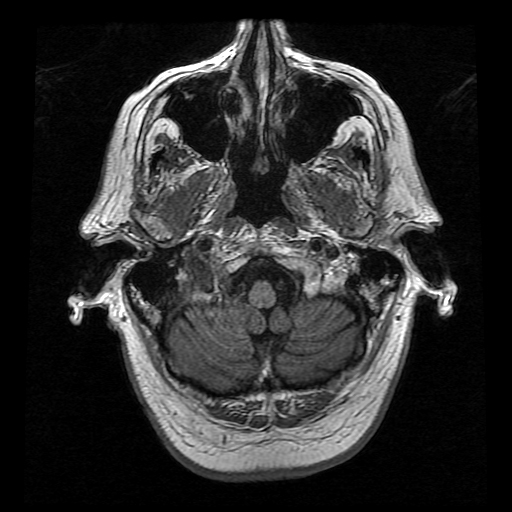
[im 34/100]
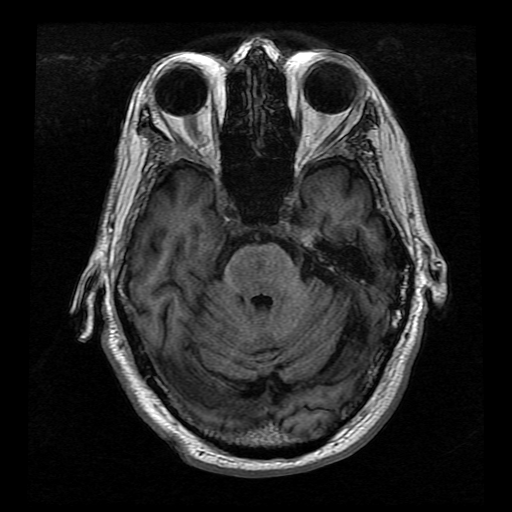

[Series 11: T2 · coronal · 5.0mm · 0.39mm/px · 2 of 29 slices shown (2 of 2)]
[im 1/29]
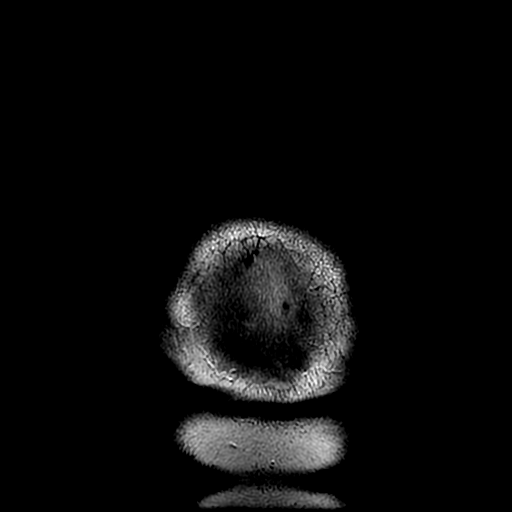
[im 29/29]
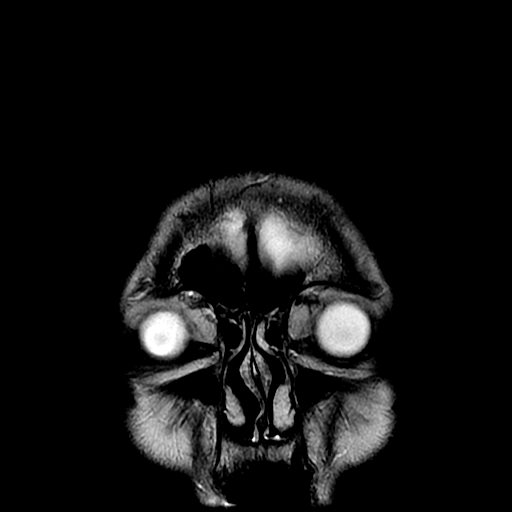

[Series 14: T1 post-contrast · coronal · 5.0mm · 0.39mm/px · 2 of 29 slices shown]
[im 1/29]
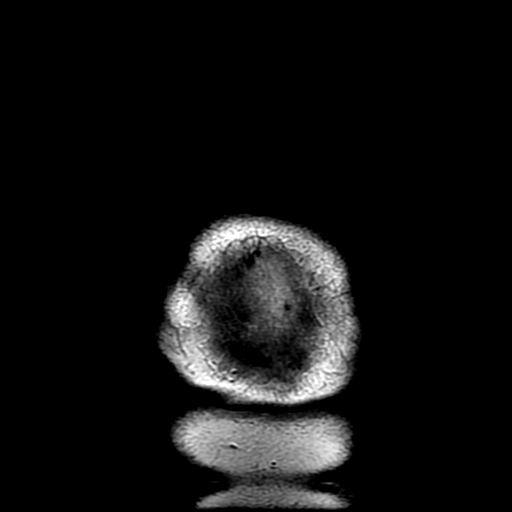
[im 29/29]
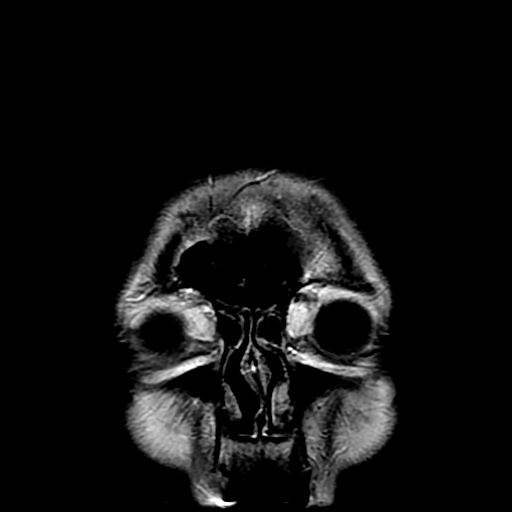

[Series 400: DWI · axial · 3.0mm · 1.09mm/px · z∈[-65,+76]mm · 4 of 49 slices shown (3 of 4)]
[im 1/49]
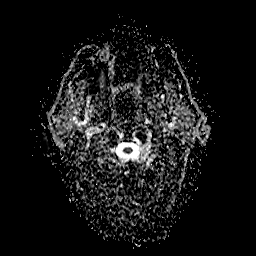
[im 17/49]
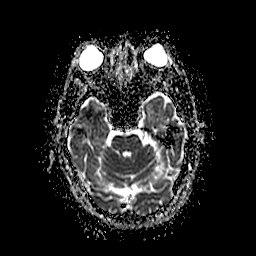
[im 33/49]
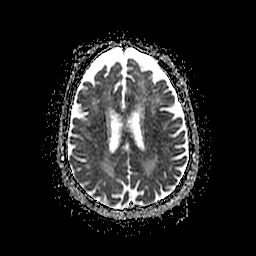
[im 49/49]
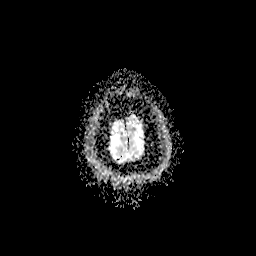

[Series 500: DWI · coronal · 5.0mm · 1.09mm/px · 3 of 39 slices shown (4 of 4)]
[im 1/39]
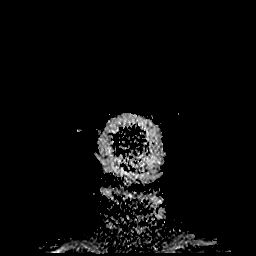
[im 20/39]
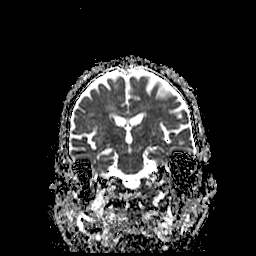
[im 39/39]
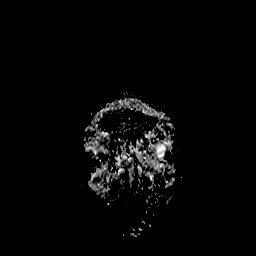

[33 of 48 positions shown; findings below may reference images not displayed]

FINDINGS: Brain: Multiple enhancing lesions are present in the brain
compatible with metastatic disease.

9 mm ring-enhancing lesion right frontal lobe. Adjacent but separate
ring-enhancing lesion right frontal lobe measuring 9 cm. Mild
associated edema.

7 mm ring-enhancing lesion right frontal lobe over the convexity
with mild enhancement. No associated hemorrhage in these lesions.

9 mm dural-based enhancing lesion right anterior middle cranial
fossa compatible with meningioma. This appears extra-axial.

Mild frontal atrophy. Chronic microvascular ischemic change in the
white matter, moderate in degree. No acute infarct or hemorrhage.

Vascular: Normal arterial flow voids

Skull and upper cervical spine: No worrisome skeletal lesion.

Sinuses/Orbits: Bilateral cataract extraction. Paranasal sinuses
clear

Other: None
IMPRESSION: Three metastatic deposits in the brain in the right frontal lobe.
Mild associated edema.

9 mm meningioma right middle cranial fossa.

Atrophy and chronic microvascular ischemic change.

## 2021-09-13 MED ORDER — ZOLEDRONIC ACID 4 MG/100ML IV SOLN
4.0000 mg | Freq: Once | INTRAVENOUS | Status: AC
  Administered 2021-09-13: 4 mg via INTRAVENOUS
  Filled 2021-09-13: qty 100

## 2021-09-13 MED ORDER — GADOBUTROL 1 MMOL/ML IV SOLN
8.0000 mL | Freq: Once | INTRAVENOUS | Status: AC | PRN
  Administered 2021-09-13: 8 mL via INTRAVENOUS

## 2021-09-13 MED ORDER — SODIUM CHLORIDE 0.9 % IV SOLN
Freq: Once | INTRAVENOUS | Status: AC
Start: 2021-09-13 — End: 2021-09-13

## 2021-09-13 NOTE — Telephone Encounter (Signed)
I called to make sure the patient and his wife knew the final results of PET and plans for MRI today, and for treatment to start next week.

## 2021-09-13 NOTE — Patient Instructions (Signed)

## 2021-09-13 NOTE — Progress Notes (Signed)
Per order, Changed device settings for MRI to  VOO at 75 bpm  Will program device back to pre-MRI settings after completion of exam, and send transmission

## 2021-09-14 ENCOUNTER — Encounter (HOSPITAL_COMMUNITY): Payer: Self-pay | Admitting: Hematology

## 2021-09-14 ENCOUNTER — Telehealth: Payer: Self-pay | Admitting: Radiation Oncology

## 2021-09-14 LAB — KAPPA/LAMBDA LIGHT CHAINS
Kappa free light chain: 27.1 mg/L — ABNORMAL HIGH (ref 3.3–19.4)
Kappa, lambda light chain ratio: 1.69 — ABNORMAL HIGH (ref 0.26–1.65)
Lambda free light chains: 16 mg/L (ref 5.7–26.3)

## 2021-09-14 MED ORDER — DEXAMETHASONE 4 MG PO TABS: 4.0000 mg | ORAL_TABLET | Freq: Two times a day (BID) | ORAL | 0 refills | Status: DC

## 2021-09-14 NOTE — Telephone Encounter (Signed)
I spoke with the patient's wife today about her husband's MRI and the three lesions in the frontal lobe, as well as the meningioma of the right middle cranial fossa. We discussed that the three lesion in the brain are consistent with metastatic lung cancer, and would offer a course of Stereotactic Radiosurgery [facility]) in a single fraction. He would need to have a 3T MRI scan for treatment planning and imaging taken every 1 mm. He would also need to meet with neurosurgery to coordinate this as they participate in the therapy for these treatments. They will receive a call from our special procedures navigator, Mont Dutton, RT and coordinate simulation where a mask would be made and the subsequent single fraction therapy. His case will be discussed in multidisciplinary brain oncology conference once we have his MRI images, and I will copy his neurologic team who he has not met since Dr. Jannifer Franklin' retirement. We discussed his symptoms that noticably started today of confusion and changes in typical behavior. As a result and with the findings of edema around the three lesions, I will start Dexamethasone 4 mg BID, but he will take two tablets tonight. Manuela Schwartz will reach out the patient to coordinate next steps as well. We reviewed the side effects of steroids, and I encouraged his wife to also give him a PPI to reduce risks of steroid induced GI ulcers. She is in agreement with this plan.

## 2021-09-15 ENCOUNTER — Other Ambulatory Visit: Payer: Self-pay | Admitting: Radiation Therapy

## 2021-09-15 ENCOUNTER — Ambulatory Visit (INDEPENDENT_AMBULATORY_CARE_PROVIDER_SITE_OTHER): Payer: Medicare Other

## 2021-09-15 ENCOUNTER — Ambulatory Visit: Payer: Medicare Other | Admitting: Radiation Oncology

## 2021-09-15 DIAGNOSIS — I4729 Other ventricular tachycardia: Secondary | ICD-10-CM

## 2021-09-15 DIAGNOSIS — I472 Ventricular tachycardia: Secondary | ICD-10-CM | POA: Diagnosis not present

## 2021-09-15 DIAGNOSIS — C7931 Secondary malignant neoplasm of brain: Secondary | ICD-10-CM

## 2021-09-15 LAB — CUP PACEART REMOTE DEVICE CHECK
Battery Remaining Longevity: 19 mo
Battery Voltage: 2.89 V
Brady Statistic RA Percent Paced: 1.48 %
Brady Statistic RV Percent Paced: 98.94 %
Date Time Interrogation Session: 20220927060321
Implantable Lead Implant Date: 20171211
Implantable Lead Implant Date: 20171211
Implantable Lead Implant Date: 20171211
Implantable Lead Location: 753858
Implantable Lead Location: 753859
Implantable Lead Location: 753860
Implantable Lead Model: 4298
Implantable Lead Model: 5076
Implantable Lead Model: 5076
Implantable Pulse Generator Implant Date: 20171211
Lead Channel Impedance Value: 1045 Ohm
Lead Channel Impedance Value: 1045 Ohm
Lead Channel Impedance Value: 266 Ohm
Lead Channel Impedance Value: 323 Ohm
Lead Channel Impedance Value: 342 Ohm
Lead Channel Impedance Value: 418 Ohm
Lead Channel Impedance Value: 475 Ohm
Lead Channel Impedance Value: 570 Ohm
Lead Channel Impedance Value: 589 Ohm
Lead Channel Impedance Value: 608 Ohm
Lead Channel Impedance Value: 779 Ohm
Lead Channel Impedance Value: 931 Ohm
Lead Channel Impedance Value: 969 Ohm
Lead Channel Impedance Value: 988 Ohm
Lead Channel Pacing Threshold Amplitude: 0.5 V
Lead Channel Pacing Threshold Amplitude: 1.375 V
Lead Channel Pacing Threshold Amplitude: 2.25 V
Lead Channel Pacing Threshold Pulse Width: 0.4 ms
Lead Channel Pacing Threshold Pulse Width: 0.4 ms
Lead Channel Pacing Threshold Pulse Width: 0.4 ms
Lead Channel Sensing Intrinsic Amplitude: 0.5 mV
Lead Channel Sensing Intrinsic Amplitude: 0.5 mV
Lead Channel Sensing Intrinsic Amplitude: 15.75 mV
Lead Channel Sensing Intrinsic Amplitude: 15.75 mV
Lead Channel Setting Pacing Amplitude: 1.5 V
Lead Channel Setting Pacing Amplitude: 3 V
Lead Channel Setting Pacing Amplitude: 4.5 V
Lead Channel Setting Pacing Pulse Width: 0.4 ms
Lead Channel Setting Pacing Pulse Width: 1 ms
Lead Channel Setting Sensing Sensitivity: 0.9 mV

## 2021-09-16 ENCOUNTER — Other Ambulatory Visit: Payer: Self-pay

## 2021-09-16 ENCOUNTER — Ambulatory Visit: Payer: Medicare Other

## 2021-09-16 ENCOUNTER — Telehealth: Payer: Self-pay | Admitting: Pharmacist

## 2021-09-16 ENCOUNTER — Inpatient Hospital Stay: Payer: Medicare Other

## 2021-09-16 ENCOUNTER — Inpatient Hospital Stay (HOSPITAL_BASED_OUTPATIENT_CLINIC_OR_DEPARTMENT_OTHER): Payer: Medicare Other | Admitting: Hematology

## 2021-09-16 ENCOUNTER — Telehealth: Payer: Self-pay

## 2021-09-16 ENCOUNTER — Other Ambulatory Visit (HOSPITAL_COMMUNITY): Payer: Self-pay

## 2021-09-16 ENCOUNTER — Encounter: Payer: Self-pay | Admitting: Hematology

## 2021-09-16 ENCOUNTER — Other Ambulatory Visit: Payer: Self-pay | Admitting: Radiation Therapy

## 2021-09-16 VITALS — BP 143/91 | HR 87 | Temp 98.2°F | Resp 17 | Ht 70.0 in | Wt 193.3 lb

## 2021-09-16 DIAGNOSIS — G893 Neoplasm related pain (acute) (chronic): Secondary | ICD-10-CM | POA: Diagnosis not present

## 2021-09-16 DIAGNOSIS — I700 Atherosclerosis of aorta: Secondary | ICD-10-CM | POA: Diagnosis not present

## 2021-09-16 DIAGNOSIS — C7951 Secondary malignant neoplasm of bone: Secondary | ICD-10-CM | POA: Diagnosis not present

## 2021-09-16 DIAGNOSIS — C3411 Malignant neoplasm of upper lobe, right bronchus or lung: Secondary | ICD-10-CM

## 2021-09-16 DIAGNOSIS — M8580 Other specified disorders of bone density and structure, unspecified site: Secondary | ICD-10-CM | POA: Diagnosis not present

## 2021-09-16 DIAGNOSIS — K449 Diaphragmatic hernia without obstruction or gangrene: Secondary | ICD-10-CM | POA: Diagnosis not present

## 2021-09-16 DIAGNOSIS — I10 Essential (primary) hypertension: Secondary | ICD-10-CM | POA: Diagnosis not present

## 2021-09-16 DIAGNOSIS — R531 Weakness: Secondary | ICD-10-CM | POA: Diagnosis not present

## 2021-09-16 DIAGNOSIS — I119 Hypertensive heart disease without heart failure: Secondary | ICD-10-CM | POA: Diagnosis not present

## 2021-09-16 DIAGNOSIS — M48061 Spinal stenosis, lumbar region without neurogenic claudication: Secondary | ICD-10-CM | POA: Diagnosis not present

## 2021-09-16 DIAGNOSIS — N281 Cyst of kidney, acquired: Secondary | ICD-10-CM | POA: Diagnosis not present

## 2021-09-16 DIAGNOSIS — J90 Pleural effusion, not elsewhere classified: Secondary | ICD-10-CM | POA: Diagnosis not present

## 2021-09-16 DIAGNOSIS — M79651 Pain in right thigh: Secondary | ICD-10-CM | POA: Diagnosis not present

## 2021-09-16 DIAGNOSIS — G20 Parkinson's disease: Secondary | ICD-10-CM | POA: Diagnosis not present

## 2021-09-16 DIAGNOSIS — C7931 Secondary malignant neoplasm of brain: Secondary | ICD-10-CM

## 2021-09-16 DIAGNOSIS — M47816 Spondylosis without myelopathy or radiculopathy, lumbar region: Secondary | ICD-10-CM | POA: Diagnosis not present

## 2021-09-16 DIAGNOSIS — E785 Hyperlipidemia, unspecified: Secondary | ICD-10-CM | POA: Diagnosis not present

## 2021-09-16 DIAGNOSIS — K4020 Bilateral inguinal hernia, without obstruction or gangrene, not specified as recurrent: Secondary | ICD-10-CM | POA: Diagnosis not present

## 2021-09-16 DIAGNOSIS — N210 Calculus in bladder: Secondary | ICD-10-CM | POA: Diagnosis not present

## 2021-09-16 DIAGNOSIS — I2510 Atherosclerotic heart disease of native coronary artery without angina pectoris: Secondary | ICD-10-CM | POA: Diagnosis not present

## 2021-09-16 DIAGNOSIS — C801 Malignant (primary) neoplasm, unspecified: Secondary | ICD-10-CM | POA: Diagnosis not present

## 2021-09-16 DIAGNOSIS — I482 Chronic atrial fibrillation, unspecified: Secondary | ICD-10-CM | POA: Diagnosis not present

## 2021-09-16 DIAGNOSIS — D329 Benign neoplasm of meninges, unspecified: Secondary | ICD-10-CM | POA: Diagnosis not present

## 2021-09-16 DIAGNOSIS — N200 Calculus of kidney: Secondary | ICD-10-CM | POA: Diagnosis not present

## 2021-09-16 LAB — MULTIPLE MYELOMA PANEL, SERUM
Albumin SerPl Elph-Mcnc: 3.4 g/dL (ref 2.9–4.4)
Albumin/Glob SerPl: 1.3 (ref 0.7–1.7)
Alpha 1: 0.2 g/dL (ref 0.0–0.4)
Alpha2 Glob SerPl Elph-Mcnc: 0.7 g/dL (ref 0.4–1.0)
B-Globulin SerPl Elph-Mcnc: 1 g/dL (ref 0.7–1.3)
Gamma Glob SerPl Elph-Mcnc: 0.9 g/dL (ref 0.4–1.8)
Globulin, Total: 2.7 g/dL (ref 2.2–3.9)
IgA: 127 mg/dL (ref 61–437)
IgG (Immunoglobin G), Serum: 884 mg/dL (ref 603–1613)
IgM (Immunoglobulin M), Srm: 57 mg/dL (ref 15–143)
Total Protein ELP: 6.1 g/dL (ref 6.0–8.5)

## 2021-09-16 MED ORDER — FENTANYL 12 MCG/HR TD PT72: 1.0000 | MEDICATED_PATCH | TRANSDERMAL | 0 refills | Status: DC

## 2021-09-16 MED ORDER — OSIMERTINIB MESYLATE 80 MG PO TABS
80.0000 mg | ORAL_TABLET | Freq: Every day | ORAL | 1 refills | Status: DC
  Filled 2021-09-16: qty 30, 30d supply, fill #0

## 2021-09-16 NOTE — Telephone Encounter (Signed)
Oral Oncology Patient Advocate Encounter   Received notification from [employer] that prior authorization for Tagrisso is required.   PA submitted on CoverMyMeds Key [id number] Status is pending   Oral Oncology Clinic will continue to follow.  Marcus Fuller Phone [phone] Fax [phone] 09/16/2021 3:20 PM

## 2021-09-16 NOTE — Telephone Encounter (Signed)
Oral Oncology Patient Advocate Encounter  Prior Authorization for [name] has been approved.    PA# [id number] Effective dates: 09/16/21 through 12/18/21  Patients co-pay is $2098  Oral Oncology Clinic will continue to follow.   [place] Patient [place] Phone [phone] Fax [phone] 09/16/2021 3:33 PM

## 2021-09-17 ENCOUNTER — Ambulatory Visit: Payer: Medicare Other

## 2021-09-19 DIAGNOSIS — C7951 Secondary malignant neoplasm of bone: Secondary | ICD-10-CM | POA: Insufficient documentation

## 2021-09-19 DIAGNOSIS — Z510 Encounter for antineoplastic radiation therapy: Secondary | ICD-10-CM | POA: Diagnosis not present

## 2021-09-19 DIAGNOSIS — C7931 Secondary malignant neoplasm of brain: Secondary | ICD-10-CM | POA: Diagnosis not present

## 2021-09-19 DIAGNOSIS — C3411 Malignant neoplasm of upper lobe, right bronchus or lung: Secondary | ICD-10-CM | POA: Insufficient documentation

## 2021-09-20 ENCOUNTER — Ambulatory Visit: Payer: Medicare Other

## 2021-09-20 NOTE — Telephone Encounter (Addendum)
Oral Oncology Pharmacist Encounter  Received new prescription for Tagrisso (osimertinib) for the treatment of metastatic NSCLC, exon 21 [id number] mutation-positive, planned duration until disease progression or unacceptable drug toxicity.  CBC w/ Diff and CMP from 09/13/21 assessed, no relevant lab abnormalities noted. Patient will need baseline EKG prior to starting therapy. Prescription dose and frequency assessed.  Current medication list in Epic reviewed, DDIs with Tagrisso (osimertinib) identified: Category D DDI between [place] and Digoxin - Tagrisso may increase serum concentrations of digoxin through PgP/ABCB1 inhibition. Recommend baseline serum digoxin levels prior to starting Tagrisso, and then recommend close monitoring of digoxin levels after initiation of Tagrisso for potential need to reduce digoxin dose.   Evaluated chart and no patient barriers to medication adherence noted.   Prescription has been e-scribed to the [facility] for benefits analysis and approval.  Oral Oncology Clinic will continue to follow for insurance authorization, copayment issues, initial counseling and start date.  Leron Croak, PharmD, BCPS Hematology/Oncology Clinical Pharmacist [facility] [phone] 09/20/2021 8:26 AM

## 2021-09-21 ENCOUNTER — Ambulatory Visit: Payer: Medicare Other

## 2021-09-22 ENCOUNTER — Encounter: Payer: Self-pay | Admitting: Hematology

## 2021-09-22 ENCOUNTER — Telehealth: Payer: Self-pay

## 2021-09-22 ENCOUNTER — Ambulatory Visit: Payer: Medicare Other

## 2021-09-22 ENCOUNTER — Other Ambulatory Visit: Payer: Self-pay

## 2021-09-22 ENCOUNTER — Ambulatory Visit
Admission: RE | Admit: 2021-09-22 | Discharge: 2021-09-22 | Disposition: A | Discharge status: Home / Self Care | Payer: Medicare Other | Source: Ambulatory Visit | Attending: Radiation Oncology | Admitting: Radiation Oncology

## 2021-09-22 DIAGNOSIS — C3411 Malignant neoplasm of upper lobe, right bronchus or lung: Secondary | ICD-10-CM | POA: Diagnosis not present

## 2021-09-22 DIAGNOSIS — Z510 Encounter for antineoplastic radiation therapy: Secondary | ICD-10-CM | POA: Diagnosis not present

## 2021-09-22 DIAGNOSIS — C7951 Secondary malignant neoplasm of bone: Secondary | ICD-10-CM | POA: Diagnosis not present

## 2021-09-22 DIAGNOSIS — C7931 Secondary malignant neoplasm of brain: Secondary | ICD-10-CM | POA: Diagnosis not present

## 2021-09-22 NOTE — Progress Notes (Signed)
Marland Kitchen   HEMATOLOGY/ONCOLOGY CLINIC NOTE  Date of Service: .09/16/2021   Patient Care Team: Cari Caraway, MD as PCP - General (Family Medicine) Sanda Klein, MD as PCP - Cardiology (Cardiology) Jola Baptist, DC as Referring Physician (Chiropractic Medicine)  CHIEF COMPLAINTS/PURPOSE OF CONSULTATION:  Newly diagnosed metastatic lung adenocarcinoma with bone mets  HISTORY OF PRESENTING ILLNESS:   Marcus Fuller is a wonderful 80 y.o. male who has been referred to [name] by Dr Eunice Blase for evaluation and management of newly noted pelvic mass/bone metastases.  Patient has a history of hypertension, Parkinson's disease [follows with Dr. Juanda Crumble Willis], coronary artery disease status post PCI in 2007, history of kidney stones, presence of permanent cardiac pacemaker who has history of chronic low back pain. Patient notes that over the last couple of months he has had increasing low back pain had an MRI of the lumbar spine without contrast ordered by Dr. Junius Roads on 08/03/2021.  MRI lumbar spine showed  1. Expansile lesion in the right iliac bone is concerning for malignancy, particularly metastatic disease; correlate with any history of malignancy. If there is no history of malignancy, oncologic workup and dedicated MRI of the hip with and without contrast are recommended for further evaluation. 2. Two small foci of signal abnormality in the L2 and L3 vertebral bodies are suspicious for additional small metastases. MRI of the lumbar spine with and without contrast may be helpful for better characterization. 3. Mild multilevel degenerative changes of the lumbar spine detailed above resulting in up to moderate right neural foraminal stenosis at L4-L5. Otherwise, no high-grade spinal canal or neural foraminal stenosis. 4. Trace free fluid in the abdomen, nonspecific.  Patient notes some pain on the right posterior hip and upper thigh.  No pain radiating into his lower extremities.  No  lower extremity weakness that is new.  No loss of bowel or bladder control.  Patient does note chronic weakness decreased mobility due to has Parkinson's disease.  He is on Sinemet for this.  He notes that at baseline he uses a rolling walker but has had difficulty lifting his right leg due to pain.  He has had chronic low back pain in the past and has been seen by Dr. Herma Mering at [facility] and has previously received 2 injections in his spine for pain management.  He also has a history of chronic atrial fibrillation anticoagulation and digoxin.  Patient notes no significant weight loss in the last 3 months. . Wt Readings from Last 3 Encounters:  09/16/21 193 lb 4.8 oz (87.7 kg)  09/08/21 195 lb (88.5 kg)  08/19/21 197 lb 8 oz (89.6 kg)   No other focal neurological deficits no headaches. No shortness of breath or new chest pain. No change in urinary or bowel habits. No previous history of any cancers.   INTERVAL HISTORY  Marcus Fuller is here with his wife and daughter to discuss results of his PET scan, MRI brain and further treatment plan for his newly diagnosed metastatic lung adenocarcinoma.  His PET CT scan from 09/10/2021 showed Hypermetabolic solid nodule of the right lung apex measuring 12 mm compatible with primary lung malignancy.   Hypermetabolic lytic lesions of the right ilium concerning for osseous metastatic disease.   Hypermetabolic focus which correlates with the right C2-C3 facet with no lytic lesion visualized, possibly due to degenerative disease. Could be further evaluated with MRI of the cervical spine if this would change clinical management.  He had an MRI of the brain with  and without contrast on 09/13/2021 which showed Three metastatic deposits in the brain in the right frontal lobe. Mild associated edema. 9 mm meningioma right middle cranial fossa. Atrophy and chronic microvascular ischemic change.  Patient has been started on dexamethasone 4 mg p.o. twice  daily for his brain metastasis related swelling by radiation oncology.  He is being set up for [facility] for his brain lesions, SBRT for his right upper lobe lung primary tumor and also radiation therapy for his painful right iliac bone metastases.  He notes his right thigh/hip pain has been improved some since his last visit with adjustment of his pain medications Zometa and steroids. He notes he is eating somewhat better.  We discussed his molecular testing which shows a PD-L1 positivity to 70% and a targetable EGFR mutation.  We discussed in detail the recommendation for systemic targeted therapy with Tagrisso to target his EGFR mutation.  He is on digoxin which can also cause QT prolongation.  We discussed that we would want to have him get the medication but not started until he is done with his radiation and we get a baseline EKG and electrolytes.  No other acute new symptoms.   MEDICAL HISTORY:  Past Medical History:  Diagnosis Date   Anxiety    Cataract    beginning stages   Chronic low back pain 07/03/2019   Coronary artery disease    stent- 2007    History of kidney stones    Hypertension    Parkinson's disease ([place]) 01/29/2018   Presence of permanent cardiac pacemaker    2017    Tremor of right hand     SURGICAL HISTORY: Past Surgical History:  Procedure Laterality Date   APPENDECTOMY     bbb     CARDIOVERSION N/A 04/19/2021   Procedure: CARDIOVERSION;  Surgeon: Lelon Perla, MD;  Location: [facility] ENDOSCOPY;  Service: Cardiovascular;  Laterality: N/A;   CHOLECYSTECTOMY N/A 05/21/2018   Procedure: LAPAROSCOPIC CHOLECYSTECTOMY;  Surgeon: Coralie Keens, MD;  Location: [facility] ORS;  Service: General;  Laterality: N/A;   left knee arthroscopy   1996   PACEMAKER INSERTION  11/2016   STENT PLACEMENT VASCULAR ([place] [facility])  2007   TONSILLECTOMY      SOCIAL HISTORY: Social History   Socioeconomic History   Marital status: Married    Spouse name: Cecelia   Number of children: 2    Years of education: 14   Highest education level: Not on file  Occupational History   Not on file  Tobacco Use   Smoking status: Never   Smokeless tobacco: Never  Vaping Use   Vaping Use: Never used  Substance and Sexual Activity   Alcohol use: Not Currently    Comment: social    Drug use: No   Sexual activity: Not Currently  Other Topics Concern   Not on file  Social History Narrative   Lives w/ wife   Caffeine use: sometimes   Right handed    Social Determinants of Health   Financial Resource Strain: Not on file  Food Insecurity: Not on file  Transportation Needs: Not on file  Physical Activity: Not on file  Stress: Not on file  Social Connections: Not on file  Intimate Partner Violence: Not on file    FAMILY HISTORY: Family History  Problem Relation Age of Onset   Heart disease Mother    Diabetes Mother    Cancer Mother        breast   Heart disease Father  Heart attack Father    Atrial fibrillation Sister    Heart disease Brother    Pancreatic disease Brother    Tremor Brother    Heart attack Paternal Grandfather    Spina bifida Brother     ALLERGIES:  is allergic to atorvastatin and codeine.  MEDICATIONS:  Current Outpatient Medications  Medication Sig Dispense Refill   osimertinib mesylate (TAGRISSO) 80 MG tablet Take 1 tablet (80 mg total) by mouth daily. 30 tablet 1   acetaminophen (TYLENOL) 325 MG tablet Take 650 mg by mouth every 6 (six) hours as needed.     carbidopa-levodopa (SINEMET) 25-250 MG tablet Take 1 tablet by mouth 3 (three) times daily. 270 tablet 3   dexamethasone (DECADRON) 4 MG tablet Take 1 tablet (4 mg total) by mouth 2 (two) times daily. 60 tablet 0   digoxin (LANOXIN) 0.125 MG tablet TAKE 1 TABLET BY MOUTH EVERY DAY 90 tablet 3   ELIQUIS 5 MG TABS tablet TAKE 1 TABLET BY MOUTH TWICE A DAY 60 tablet 5   fentaNYL (DURAGESIC) 12 MCG/HR Place 1 patch onto the skin every 3 (three) days. 10 patch 0   gabapentin (NEURONTIN) 600  MG tablet 1/2 tablet in the morning and midday, 1 at night 180 tablet 3   HYDROmorphone (DILAUDID) 2 MG tablet Take 1 tablet (2 mg total) by mouth every 4 (four) hours as needed for severe pain. 30 tablet 0   Melatonin 10 MG TABS Take 10 mg by mouth at bedtime.     Naproxen Sodium 220 MG CAPS      OVER THE COUNTER MEDICATION Apply 1 application topically at bedtime. Outback Relief     senna-docusate (SENNA S) 8.6-50 MG tablet Take 2 tablets by mouth at bedtime. 60 tablet 1   No current facility-administered medications for this visit.    REVIEW OF SYSTEMS:    .10 Point review of Systems was done is negative except as noted above.  PHYSICAL EXAMINATION: ECOG PERFORMANCE STATUS: 3 - Symptomatic, >50% confined to bed  . Vitals:   09/16/21 1351  BP: (!) 143/91  Pulse: 87  Resp: 17  Temp: 98.2 F (36.8 C)  SpO2: 98%   Filed Weights   09/16/21 1351  Weight: 193 lb 4.8 oz (87.7 kg)   .Body mass index is 27.74 kg/m. Marland Kitchen GENERAL:alert, in no acute distress and comfortable SKIN: no acute rashes, no significant lesions EYES: conjunctiva are pink and non-injected, sclera anicteric OROPHARYNX: MMM, no exudates, no oropharyngeal erythema or ulceration NECK: supple, no JVD LYMPH:  no palpable lymphadenopathy in the cervical, axillary or inguinal regions LUNGS: clear to auscultation b/l with normal respiratory effort HEART: regular rate & rhythm ABDOMEN:  normoactive bowel sounds , non tender, not distended. Extremity: no pedal edema PSYCH: alert & oriented x 3 with fluent speech NEURO: no focal motor/sensory deficits LABORATORY DATA:  I have reviewed the data as listed  . CBC Latest Ref Rng & Units 09/13/2021 08/19/2021 04/06/2021  WBC 4.0 - 10.5 K/uL 9.6 9.4 9.0  Hemoglobin 13.0 - 17.0 g/dL 15.8 15.8 16.0  Hematocrit 39.0 - 52.0 % 49.8 48.0 50.7  Platelets 150 - 400 K/uL 188 166 161    . CMP Latest Ref Rng & Units 09/13/2021 08/19/2021 04/06/2021  Glucose 70 - 99 mg/dL 112(H)  110(H) 133(H)  BUN 8 - 23 mg/dL _0 Creatinine 0.61 - 1.24 mg/dL 0.84 0.86 0.99  Sodium 135 - 145 mmol/L 140 142 140  Potassium 3.5 - 5.1  mmol/L 4.1 4.2 4.1  Chloride 98 - 111 mmol/L 105 107 105  CO2 22 - 32 mmol/L _0 Calcium 8.9 - 10.3 mg/dL 10.5(H) 10.1 9.8  Total Protein 6.5 - 8.1 g/dL 6.7 6.4(L) -  Total Bilirubin 0.3 - 1.2 mg/dL 1.2 1.1 -  Alkaline Phos 38 - 126 U/L 306(H) 184(H) -  AST 15 - 41 U/L 29 30 -  ALT 0 - 44 U/L 8 12 -    SURGICAL PATHOLOGY  CASE: [id number]  PATIENT: Madaline Guthrie  Surgical Pathology Report      Clinical History: Painful right pelvic bone lesion at juncture of iliac  bone and ischium. Elevated free kappa light chains. 5 cm right  iliac/ischial expansile lytic bone lesion. Possible plasmacytoma vs  metastatic lesion vs benign lytic lesion. (crm)      FINAL MICROSCOPIC DIAGNOSIS:  A. BONE, RIGHT PELVIS, ILIAC/ISCHIAL LYTIC, BIOPSY:  - Metastatic adenocarcinoma consistent with lung primary.  - See comment.    RADIOGRAPHIC STUDIES: I have personally reviewed the radiological images as listed and agreed with the findings in the report. MR Brain W Wo Contrast  Result Date: 09/14/2021 CLINICAL DATA:  Non-small-cell lung cancer, staging EXAM: MRI HEAD WITHOUT AND WITH CONTRAST TECHNIQUE: Multiplanar, multiecho pulse sequences of the brain and surrounding structures were obtained without and with intravenous contrast. CONTRAST:  56m GADAVIST GADOBUTROL 1 MMOL/ML IV SOLN COMPARISON:  None. FINDINGS: Brain: Multiple enhancing lesions are present in the brain compatible with metastatic disease. 9 mm ring-enhancing lesion right frontal lobe. Adjacent but separate ring-enhancing lesion right frontal lobe measuring 9 cm. Mild associated edema. 7 mm ring-enhancing lesion right frontal lobe over the convexity with mild enhancement. No associated hemorrhage in these lesions. 9 mm dural-based enhancing lesion right anterior middle cranial fossa  compatible with meningioma. This appears extra-axial. Mild frontal atrophy. Chronic microvascular ischemic change in the white matter, moderate in degree. No acute infarct or hemorrhage. Vascular: Normal arterial flow voids Skull and upper cervical spine: No worrisome skeletal lesion. Sinuses/Orbits: Bilateral cataract extraction. Paranasal sinuses clear Other: None IMPRESSION: Three metastatic deposits in the brain in the right frontal lobe. Mild associated edema. 9 mm meningioma right middle cranial fossa. Atrophy and chronic microvascular ischemic change. Electronically Signed   By: CFranchot GalloM.D.   On: 09/14/2021 09:32   CT CHEST ABDOMEN PELVIS W CONTRAST  Result Date: 09/06/2021 CLINICAL DATA:  Initial staging of newly diagnosed, likely metastatic lung cancer. Recent biopsy of a right iliac bone lesion. EXAM: CT CHEST, ABDOMEN, AND PELVIS WITH CONTRAST TECHNIQUE: Multidetector CT imaging of the chest, abdomen and pelvis was performed following the standard protocol during bolus administration of intravenous contrast. CONTRAST:  862mOMNIPAQUE IOHEXOL 350 MG/ML SOLN COMPARISON:  /10/2018. FINDINGS: CT CHEST FINDINGS Cardiovascular: Pacer. Aortic atherosclerosis. Tortuous thoracic aorta. Mild cardiomegaly with 3 vessel coronary artery calcification. No central pulmonary embolism, on this non-dedicated study. Mediastinum/Nodes: No supraclavicular adenopathy. No mediastinal or hilar adenopathy. Moderate hiatal hernia, with 1/2 of the stomach in the lower chest. Lungs/Pleura: Trace right larger than left pleural effusions. Minimal motion degradation in the lower chest. Calcified and noncalcified pulmonary nodules. The largest noncalcified nodule is in the right apex with mild spiculation, including at 1.2 x 1.1 cm on 20/4. No other noncalcified nodules, including within the Peri fissural superior segment right lower lobe at 6 mm on 52/4. Musculoskeletal: No acute osseous abnormality. CT ABDOMEN PELVIS  FINDINGS Hepatobiliary: Probable hepatic steatosis. Nonspecific caudate lobe enlargement. No focal liver lesion.  Cholecystectomy, without biliary ductal dilatation. Pancreas: Fatty replacement involving the pancreatic head and uncinate process. No duct dilatation or acute inflammation. Spleen: Old granulomatous disease in the spleen. Adrenals/Urinary Tract: Normal adrenal glands. Mild renal cortical thinning bilaterally. Punctate upper pole right renal collecting system calculus. Low-density bilateral renal lesions are likely cysts. There also too small to characterize lesions in both kidneys. Left-sided bladder stone of 2.0 cm. Stomach/Bowel: Normal remainder of the stomach. Normal large and small bowel loops. Vascular/Lymphatic: Aortic atherosclerosis. No abdominopelvic adenopathy. Reproductive: Moderate prostatomegaly. Other: Small bilateral fat containing inguinal hernias. No significant free fluid. Musculoskeletal: Again identified is a lipoma within the left-sided tensor fascia lata. Mild osteopenia. Left iliac wing sclerotic lesion is similar and considered benign. The right iliac destructive lesion is again identified, including at 4.9 cm on 107/2. IMPRESSION: 1. Right apical 12 mm lesion is suspicious for primary bronchogenic carcinoma, given clinical history. Other smaller pulmonary nodules are indeterminate, but metastatic disease cannot be excluded. 2. No thoracic adenopathy. 3. No primary malignancy or soft tissue metastasis within the abdomen or pelvis. 4. Right iliac lesion, as on prior MRI. 5. Tiny bilateral pleural effusions. 6. Probable hepatic steatosis and nonspecific caudate lobe enlargement. 7. Enlarging bladder stone and right nephrolithiasis. 8. Coronary artery atherosclerosis. Aortic Atherosclerosis (ICD10-I70.0). 9. Hiatal hernia Electronically Signed   By: Abigail Miyamoto M.D.   On: 09/06/2021 10:38   NM PET Image Initial (PI) Skull Base To Thigh  Result Date: 09/10/2021 CLINICAL DATA:   Initial treatment strategy for non-small-cell lung cancer. EXAM: NUCLEAR MEDICINE PET SKULL BASE TO THIGH TECHNIQUE: 9.7 mCi F-18 FDG was injected intravenously. Full-ring PET imaging was performed from the skull base to thigh after the radiotracer. CT data was obtained and used for attenuation correction and anatomic localization. Fasting blood glucose: 108 mg/dl COMPARISON:  CT chest, and pelvis with contrast dated September 03, 2021 FINDINGS: Mediastinal blood pool activity: SUV max 2.5 Liver activity: SUV max 3.6 NECK: Focal FDG uptake at the right C2-C3 facet seen on series 4, image 25 an SUV max of 9.1. No hypermetabolic lymph nodes in the neck Incidental CT findings: none CHEST: Solid nodule of the right lung apex measuring 12 mm on image 9 an SV max of 4.1. Additional small bilateral pulmonary nodules are seen metoprolol resolution PET-CT. No hypermetabolic mediastinal or hilar nodes. Incidental CT findings: Cardiomegaly. No pericardial effusion. Left chest wall pacer with leads the right atrium, right ventricle and pain overlying the left ventricle. Three-vessel coronary artery calcifications. Calcified mediastinal lymph nodes. Small mediastinal lymph nodes with FDG uptake similar to mediastinal blood pool. Atherosclerotic disease of the thoracic aorta. Moderate-sized hiatal hernia. Small right pleural effusion with no FDG avidity. ABDOMEN/PELVIS: No abnormal hypermetabolic activity within the liver, pancreas, adrenal glands, or spleen. No hypermetabolic lymph nodes in the abdomen or pelvis. Incidental CT findings: Goals cystectomy clips. No biliary ductal dilation. Fatty atrophy of the pancreas. Calcified nodules in the spleen, likely sequela of prior granulomatous infection. Bilateral adrenal glands are unremarkable. Punctate nonobstructing stone of the lower pole the right kidney. Bilateral simple renal cysts. Unchanged bladder stone. Prostatomegaly measuring up to 3 cm. No evidence of bowel wall  thickening or obstruction. Calcified plaque of the abdominal aorta. SKELETON: Large hypermetabolic lytic lesion of the right iliac bone with an SUV max of 11.8. Additional smaller hypermetabolic lesion is seen in the right iliac wing on image 165 with an SUV max of 4.2. Lytic hypermetabolic lesions of the posterior right acetabulum with SUV max  of 8.4. Incidental CT findings: none IMPRESSION: Hypermetabolic solid nodule of the right lung apex measuring 12 mm compatible with primary lung malignancy. Hypermetabolic lytic lesions of the right ilium concerning for osseous metastatic disease. Hypermetabolic focus which correlates with the right C2-C3 facet with no lytic lesion visualized, possibly due to degenerative disease. Could be further evaluated with MRI of the cervical spine if this would change clinical management. Electronically Signed   By: Yetta Glassman M.D.   On: 09/10/2021 11:04   CT Biopsy  Result Date: 08/27/2021 CLINICAL DATA:  Lytic and destructive bone lesion involving the right posterior iliac bone and ischium. EXAM: CT GUIDED CORE BIOPSY OF RIGHT DEEP PELVIC ILIAC/ISCHIAL BONE LESION ANESTHESIA/SEDATION: 2.0 mg IV Versed; 100 mcg IV Fentanyl Total Moderate Sedation Time:  17 minutes. The patient's level of consciousness and physiologic status were continuously monitored during the procedure by Radiology nursing. PROCEDURE: The procedure risks, benefits, and alternatives were explained to the patient. Questions regarding the procedure were encouraged and answered. The patient understands and consents to the procedure. A time-out was performed prior to initiating the procedure. CT was performed through the bony pelvis in a prone position. The right gluteal region was prepped with chlorhexidine in a sterile fashion, and a sterile drape was applied covering the operative field. A sterile gown and sterile gloves were used for the procedure. Local anesthesia was provided with 1% Lidocaine. Under CT  guidance, an 11 gauge OnControl bone biopsy needle was advanced to the posterior margin a lytic lesion centered in the right iliac bone and also extending into the right ischium. This needle was used in obtaining 2 separate core biopsy samples through the lesion. Material was submitted in formalin. COMPLICATIONS: None FINDINGS: Expansile and destructive lytic lesion of the right iliac bone and ischium measures roughly 3.4 x 5.0 cm in maximum transverse dimensions. Fragmented solid tissue was obtained from the lesion. IMPRESSION: CT-guided core biopsy performed of a lytic bone lesion involving the right iliac bone and ischium and measuring approximately 5 cm in maximum transverse diameter. Electronically Signed   By: Aletta Edouard M.D.   On: 08/27/2021 16:41   CUP PACEART REMOTE DEVICE CHECK  Result Date: 09/15/2021 Scheduled remote reviewed. Normal device function.  Alert: AT/AF >= 6 hr for 2 days. Persistent AF since 0514/22. +OAC per prior report. Effective BiV paced 96.6%. OptiVol stable. Next remote 91 days.   ASSESSMENT & PLAN:   80 year old male with history of Parkinson's disease, coronary artery disease, atrial fibrillation on anticoagulation with  1) Newly diagnosed metastatic lung adenocarcinoma with bone mets and brain metastases in the right frontal lobe EGFR mutation positive PD-L1 70% positive Neoplasm related pain is in his right posterior hip and upper thigh 2) L2 and L3 lesions concerning for bone metastases. 3) history of Parkinson's disease on Sinemet follows with Dr. Margette Fast 4) coronary artery disease status post PCI 2007 atrial fibrillation status post pacemaker placement on anticoagulation and digoxin follows with Dr. Sallyanne Kuster 5) hypertension 6) dyslipidemia PLAN -We discussed all available lab results. -Discussed his PET CT scan from 09/10/2021 showed Hypermetabolic solid nodule of the right lung apex measuring 12 mm compatible with primary lung  malignancy.Hypermetabolic lytic lesions of the right ilium concerning for osseous metastatic disease.  Hypermetabolic focus which correlates with the right C2-C3 facet with no lytic lesion visualized, possibly due to degenerative disease.   -We discussed his MRI of the brain with and without contrast on 09/13/2021 which showed Three metastatic deposits in the  brain in the right frontal lobe. Mild associated edema. 9 mm meningioma right middle cranial fossa. Atrophy and chronic microvascular ischemic change.  -Patient has been started on dexamethasone 4 mg p.o. twice daily for his brain metastasis related swelling by radiation oncology.  -He is being set up for [facility] for his brain lesions, SBRT for his right upper lobe lung primary tumor and also radiation therapy for his painful right iliac bone metastases.  -He notes his right thigh/hip pain has been improved some since his last visit with adjustment of his pain medications Zometa and steroids.  We will continue his low-dose fentanyl patch and as needed Dilaudid. -We discussed his molecular testing which shows a PD-L1 positivity to 70% and a targetable EGFR mutation.  -We discussed in detail the recommendation for systemic targeted therapy with Tagrisso to target his EGFR mutation.  He is on digoxin which can also cause QT prolongation.  We discussed that we would want to have him get the medication but not start until he is done with his radiation and we get a baseline EKG and electrolytes.  Follow-up  RTC with Dr Irene Limbo with labs in 3 weeks   All of the patients questions were answered with apparent satisfaction. The patient knows to call the clinic with any problems, questions or concerns. . The total time spent in the appointment was 41 minutes and more than 50% was on counseling and direct patient cares.    Sullivan Lone MD [place] AAHIVMS [facility] Hematology/Oncology Physician [facility]

## 2021-09-22 NOTE — Telephone Encounter (Signed)
Oral Oncology Patient Advocate Encounter  Met patient in [facility] Lobby to complete application for [place] in an effort to reduce the patient's out of pocket expense for Tagrisso to $0.    Application completed and faxed to [phone].   [place] phone number for follow up is [phone].   This encounter will be updated until final determination.    [facility] Patient Advocate [facility] Phone [phone] Fax [phone] 09/22/2021 11:30 AM  

## 2021-09-22 NOTE — Progress Notes (Signed)
Remote pacemaker transmission.   

## 2021-09-23 ENCOUNTER — Ambulatory Visit: Payer: Medicare Other

## 2021-09-23 ENCOUNTER — Encounter: Payer: Self-pay | Admitting: Radiation Oncology

## 2021-09-24 ENCOUNTER — Other Ambulatory Visit: Payer: Self-pay

## 2021-09-24 ENCOUNTER — Ambulatory Visit (HOSPITAL_COMMUNITY)
Admission: RE | Admit: 2021-09-24 | Discharge: 2021-09-24 | Disposition: A | Discharge status: Home / Self Care | Payer: Medicare Other | Source: Ambulatory Visit | Attending: Radiation Oncology | Admitting: Radiation Oncology

## 2021-09-24 ENCOUNTER — Ambulatory Visit
Admission: RE | Admit: 2021-09-24 | Discharge: 2021-09-24 | Disposition: A | Discharge status: Home / Self Care | Payer: Medicare Other | Source: Ambulatory Visit | Attending: Radiation Oncology | Admitting: Radiation Oncology

## 2021-09-24 ENCOUNTER — Ambulatory Visit: Payer: Medicare Other

## 2021-09-24 DIAGNOSIS — G319 Degenerative disease of nervous system, unspecified: Secondary | ICD-10-CM | POA: Diagnosis not present

## 2021-09-24 DIAGNOSIS — G9389 Other specified disorders of brain: Secondary | ICD-10-CM | POA: Diagnosis not present

## 2021-09-24 DIAGNOSIS — Z510 Encounter for antineoplastic radiation therapy: Secondary | ICD-10-CM | POA: Diagnosis not present

## 2021-09-24 DIAGNOSIS — C3411 Malignant neoplasm of upper lobe, right bronchus or lung: Secondary | ICD-10-CM | POA: Diagnosis not present

## 2021-09-24 DIAGNOSIS — C7931 Secondary malignant neoplasm of brain: Secondary | ICD-10-CM | POA: Diagnosis not present

## 2021-09-24 DIAGNOSIS — C7951 Secondary malignant neoplasm of bone: Secondary | ICD-10-CM | POA: Diagnosis not present

## 2021-09-24 IMAGING — MR MR HEAD WO/W CM
13 of 14 series · 37 of 48 positions shown · IV contrast (Contrast agent)
Comparison: MR Head [DATE].

CLINICAL DATA: Brain/CNS neoplasm, staging. SRS treatment planning.
Non-small cell lung cancer.

EXAM:
MRI HEAD WITHOUT AND WITH CONTRAST
TECHNIQUE: Multiplanar, multiecho pulse sequences of the brain and surrounding
structures were obtained without and with intravenous contrast.
CONTRAST:  8mL GADAVIST GADOBUTROL 1 MMOL/ML IV SOLN

[Series 9: FLAIR · sagittal · 3.0mm · 0.81mm/px · 2 of 42 slices shown (1 of 2)]
[im 1/42]
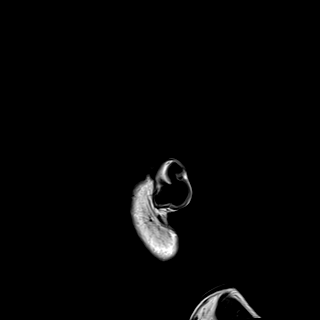
[im 42/42]
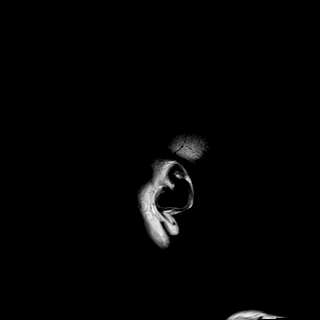

[Series 10: DWI · axial · 3.0mm · 0.88mm/px · z∈[-68,+80]mm · 5 of 104 slices shown (1 of 2)]
[im 1/104]
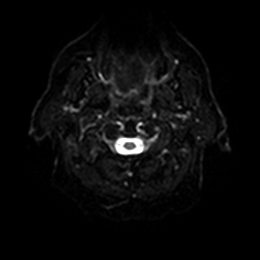
[im 26/104]
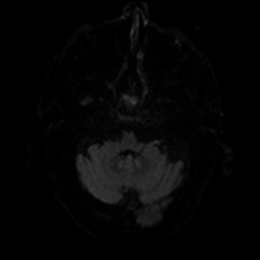
[im 52/104]
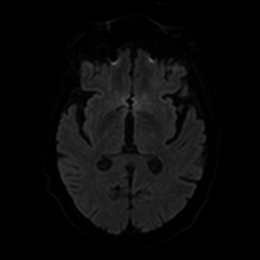
[im 78/104]
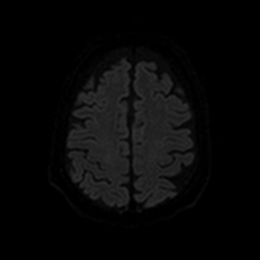
[im 104/104]
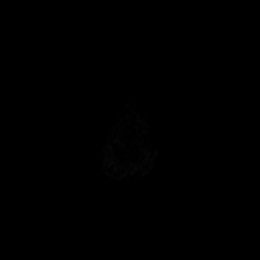

[Series 11: DWI · axial · 3.0mm · 0.88mm/px · z∈[-68,+80]mm · 2 of 52 slices shown (2 of 2)]
[im 1/52]
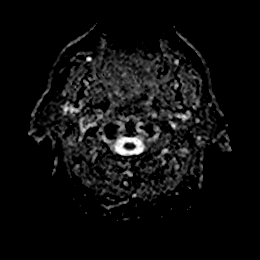
[im 52/52]
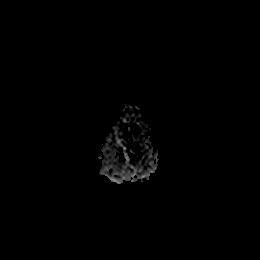

[Series 12: FLAIR · axial · 2.0mm · 0.51mm/px · z∈[-98,+82]mm · 3 of 74 slices shown (2 of 2)]
[im 1/74]
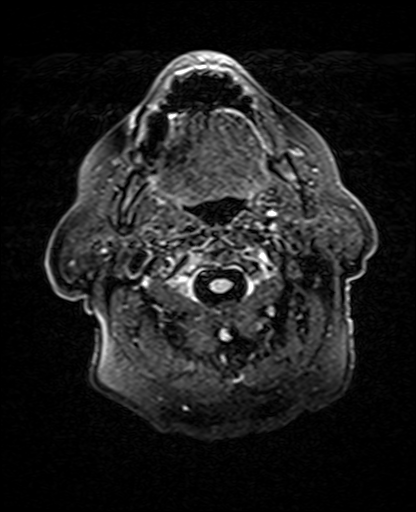
[im 37/74]
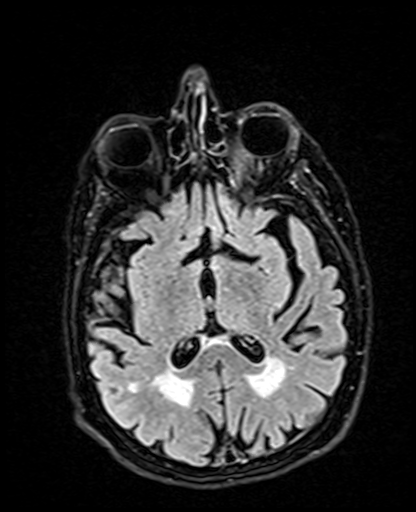
[im 74/74]
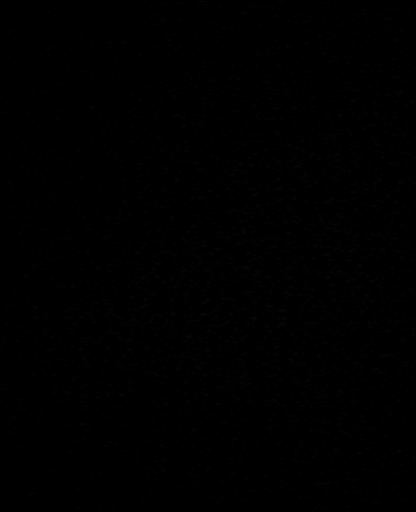

[Series 13: mag_images · axial · 3.0mm · 1.02mm/px · z∈[-78,+95]mm · 3 of 60 slices shown]
[im 1/60]
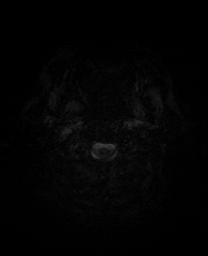
[im 30/60]
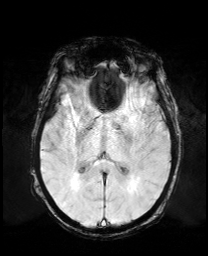
[im 60/60]
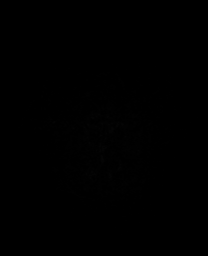

[Series 14: pha_images · axial · 3.0mm · 1.02mm/px · z∈[-78,+87]mm · 3 of 57 slices shown]
[im 1/57]
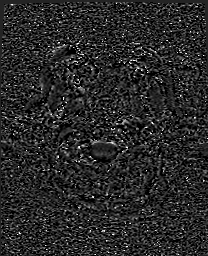
[im 29/57]
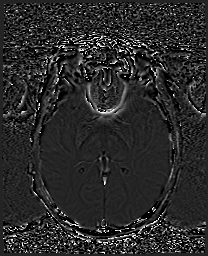
[im 57/57]
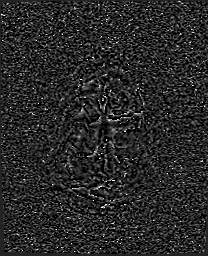

[Series 15: swi_images · axial · 3.0mm · 1.02mm/px · z∈[-78,+95]mm · 3 of 60 slices shown]
[im 1/60]
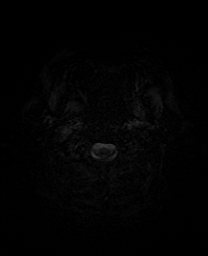
[im 30/60]
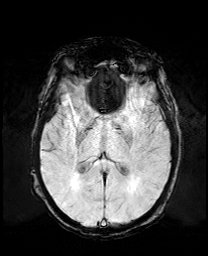
[im 60/60]
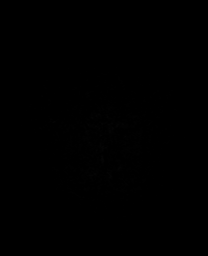

[Series 16: mip_images(sw) · axial · 24.0mm · 1.02mm/px · z∈[-68,+85]mm · 2 of 53 slices shown]
[im 1/53]
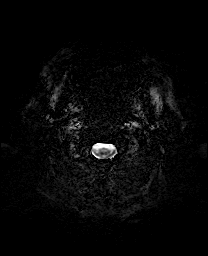
[im 53/53]
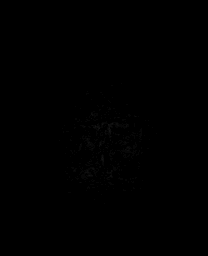

[Series 17: t1_mprage_tra_p2_iso no angle · axial · 1.0mm · 1.02mm/px · z∈[-99,+0]mm · 5 of 175 slices shown]
[im 1/175]
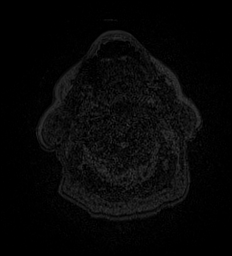
[im 25/175]
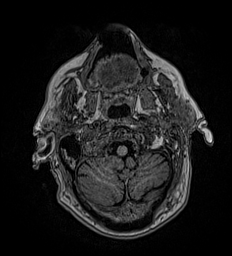
[im 50/175]
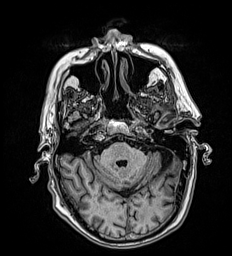
[im 75/175]
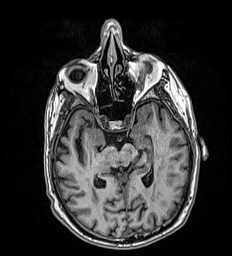
[im 100/175]
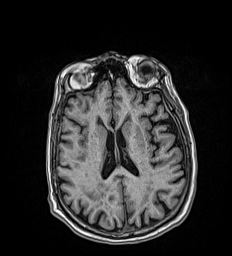

[Series 18: T2 · coronal · 3.0mm · 0.34mm/px · 3 of 60 slices shown (1 of 2)]
[im 1/60]
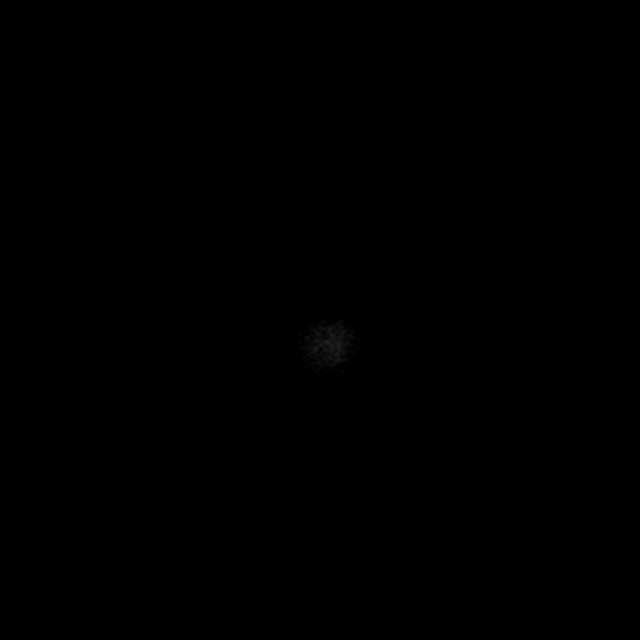
[im 30/60]
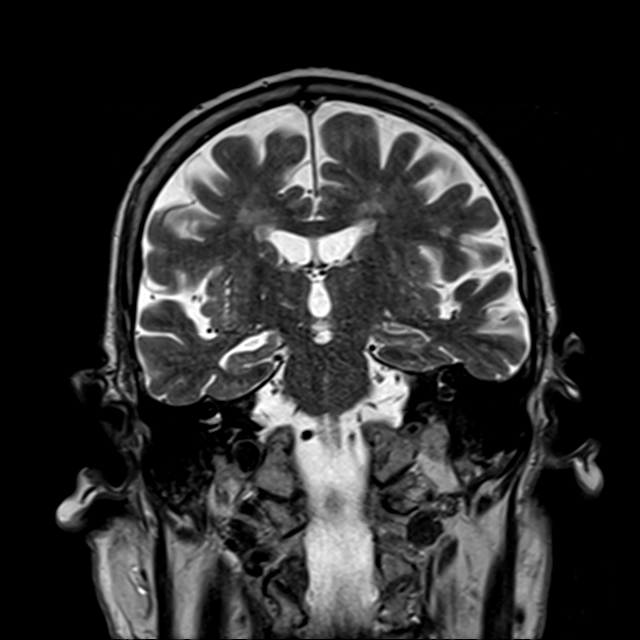
[im 60/60]
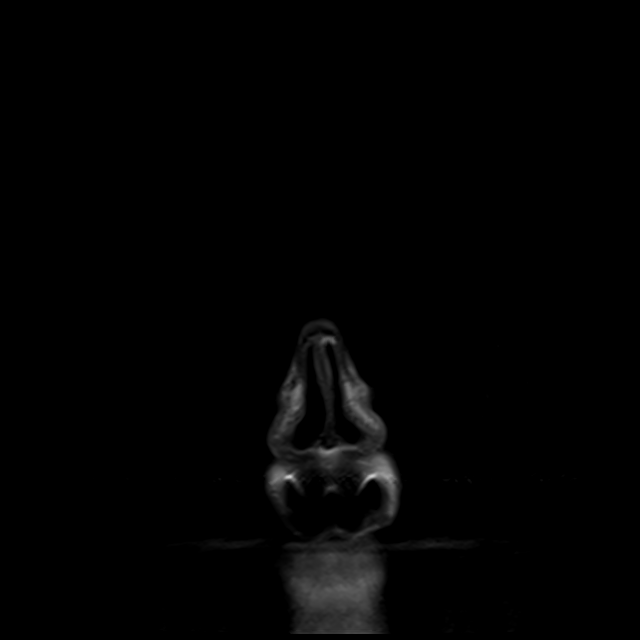

[Series 19: T2 · axial · 5.0mm · 0.81mm/px · 1 of 30 slices shown (2 of 2)]
[im 1/30]
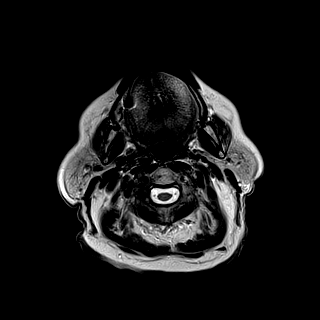

[Series 21: T1 post-contrast · coronal · 3.0mm · 0.34mm/px · 3 of 60 slices shown]
[im 1/60]
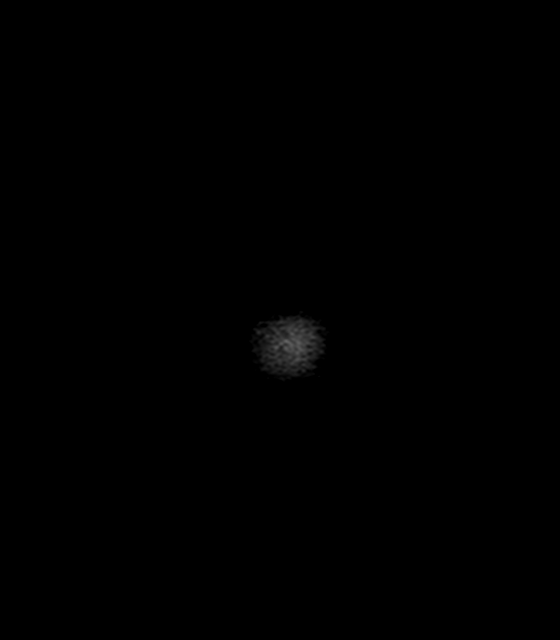
[im 30/60]
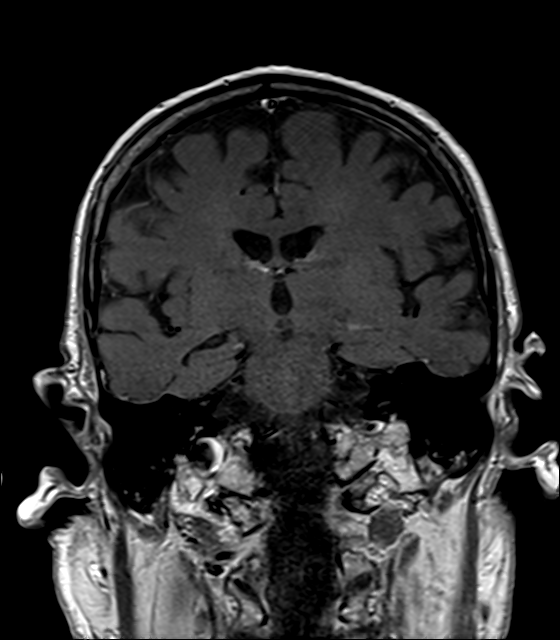
[im 60/60]
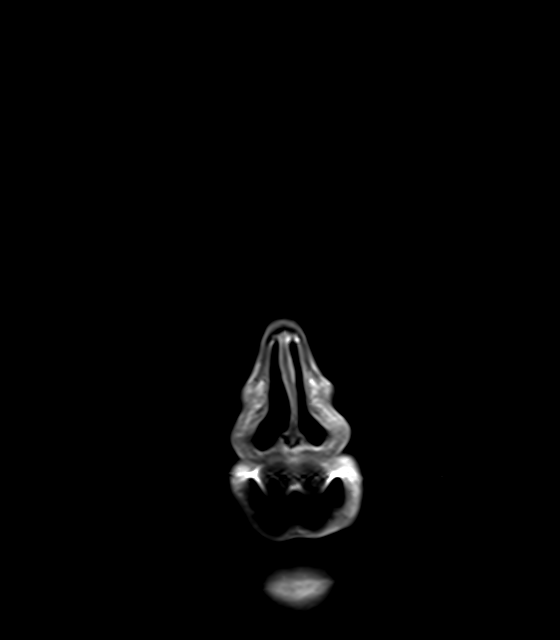

[Series 22: FLAIR post-contrast · sagittal · 3.0mm · 0.81mm/px · 2 of 42 slices shown]
[im 1/42]
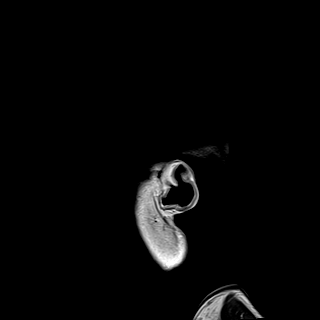
[im 42/42]
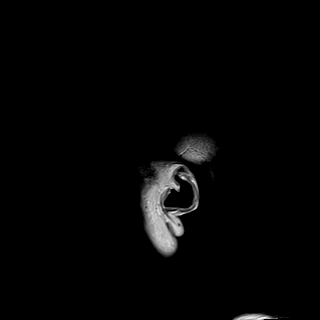

[37 of 48 positions shown; findings below may reference images not displayed]

FINDINGS: Brain: There is no evidence of an acute infarct, intracranial
hemorrhage, midline shift, or extra-axial fluid collection. Patchy
T2 hyperintensities throughout the cerebral white matter bilaterally
are unchanged and nonspecific but compatible with moderate to severe
chronic small vessel ischemic disease. There is mild generalized
cerebral atrophy.

Three ring-enhancing lesions in the right frontal lobe are unchanged
from the recent prior, measuring 9 mm (series 20, image 113), 10 mm
(series 20, image 117), and 8 mm (series 20, image 133). There is no
significant associated edema, and no new lesions are identified. A 9
mm homogeneously enhancing extra-axial mass in the right middle
cranial fossa is unchanged (series 20, image 85).

Vascular: Major intracranial vascular flow voids are preserved.

Skull and upper cervical spine: Unremarkable bone marrow signal.

Sinuses/Orbits: Bilateral cataract extraction. Paranasal sinuses and
mastoid air cells are clear.

Other: None.
IMPRESSION: 1. Three unchanged metastases in the right frontal lobe. No new
lesions identified.
2. Unchanged 9 mm extra-axial mass in the right middle cranial fossa
most consistent with a meningioma.
3. Moderate to severe chronic small vessel ischemic disease.

## 2021-09-24 MED ORDER — GADOBUTROL 1 MMOL/ML IV SOLN
8.0000 mL | Freq: Once | INTRAVENOUS | Status: AC | PRN
  Administered 2021-09-24: 8 mL via INTRAVENOUS

## 2021-09-24 NOTE — Progress Notes (Signed)
Per order, changed device settings for MRI to VOO at 80 bpm   Tachy-therapies to off if applicable.   Will program device back to pre-MRI settings after completion of exam.

## 2021-09-24 NOTE — Telephone Encounter (Signed)
Patient is approved for Tagrisso at no cost from [facility] 09/23/21-12/18/21   [employer] uses [facility] Phone [phone] Fax [phone] 09/24/2021 8:14 AM

## 2021-09-24 NOTE — Progress Notes (Signed)
Informed of MRI for today.   Device system confirmed to be MRI conditional, with implant date > 6 weeks ago, and no evidence of abandoned or epicardial leads in review of most recent CXR Interrogation from today reviewed, pt is currently  A fib with BiV pacing  at ~70 bpm Change device settings for MRI to VOO at 80 bpm  Tachy-therapies to off if applicable.  Program device back to pre-MRI settings after completion of exam.  Annamaria Helling  09/24/2021 12:46 PM

## 2021-09-27 ENCOUNTER — Ambulatory Visit
Admission: RE | Admit: 2021-09-27 | Discharge: 2021-09-27 | Disposition: A | Discharge status: Home / Self Care | Payer: Medicare Other | Source: Ambulatory Visit | Attending: Radiation Oncology | Admitting: Radiation Oncology

## 2021-09-27 ENCOUNTER — Inpatient Hospital Stay: Payer: Medicare Other | Attending: Hematology

## 2021-09-27 ENCOUNTER — Ambulatory Visit: Payer: Medicare Other

## 2021-09-27 ENCOUNTER — Other Ambulatory Visit: Payer: Self-pay

## 2021-09-27 DIAGNOSIS — C7931 Secondary malignant neoplasm of brain: Secondary | ICD-10-CM

## 2021-09-27 DIAGNOSIS — C3411 Malignant neoplasm of upper lobe, right bronchus or lung: Secondary | ICD-10-CM | POA: Diagnosis not present

## 2021-09-27 DIAGNOSIS — Z510 Encounter for antineoplastic radiation therapy: Secondary | ICD-10-CM | POA: Diagnosis not present

## 2021-09-27 DIAGNOSIS — C7951 Secondary malignant neoplasm of bone: Secondary | ICD-10-CM | POA: Diagnosis not present

## 2021-09-27 NOTE — Progress Notes (Signed)
Has armband been applied?  yes  Does patient have an allergy to IV contrast dye?: No   Has patient ever received premedication for IV contrast dye?:  n/a  Does patient take metformin?: No  If patient does take metformin when was the last dose: n/a  Date of lab work: 09/13/2021 BUN: 17 CR: 0.84 eGfr: >60  IV site: left Ac  Has IV site been added to flowsheet?  yes

## 2021-09-28 ENCOUNTER — Ambulatory Visit: Payer: Medicare Other

## 2021-09-28 ENCOUNTER — Ambulatory Visit
Admission: RE | Admit: 2021-09-28 | Discharge: 2021-09-28 | Disposition: A | Discharge status: Home / Self Care | Payer: Medicare Other | Source: Ambulatory Visit | Attending: Radiation Oncology | Admitting: Radiation Oncology

## 2021-09-28 DIAGNOSIS — C7931 Secondary malignant neoplasm of brain: Secondary | ICD-10-CM | POA: Diagnosis not present

## 2021-09-28 DIAGNOSIS — Z510 Encounter for antineoplastic radiation therapy: Secondary | ICD-10-CM | POA: Diagnosis not present

## 2021-09-28 DIAGNOSIS — C3411 Malignant neoplasm of upper lobe, right bronchus or lung: Secondary | ICD-10-CM | POA: Diagnosis not present

## 2021-09-28 DIAGNOSIS — C7951 Secondary malignant neoplasm of bone: Secondary | ICD-10-CM | POA: Diagnosis not present

## 2021-09-29 ENCOUNTER — Ambulatory Visit: Payer: Medicare Other

## 2021-09-29 DIAGNOSIS — C7931 Secondary malignant neoplasm of brain: Secondary | ICD-10-CM | POA: Diagnosis not present

## 2021-09-30 ENCOUNTER — Ambulatory Visit
Admission: RE | Admit: 2021-09-30 | Discharge: 2021-09-30 | Disposition: A | Discharge status: Home / Self Care | Payer: Medicare Other | Source: Ambulatory Visit | Attending: Radiation Oncology | Admitting: Radiation Oncology

## 2021-09-30 ENCOUNTER — Other Ambulatory Visit: Payer: Self-pay

## 2021-09-30 DIAGNOSIS — C7931 Secondary malignant neoplasm of brain: Secondary | ICD-10-CM | POA: Diagnosis not present

## 2021-09-30 DIAGNOSIS — C7951 Secondary malignant neoplasm of bone: Secondary | ICD-10-CM | POA: Diagnosis not present

## 2021-09-30 DIAGNOSIS — Z510 Encounter for antineoplastic radiation therapy: Secondary | ICD-10-CM | POA: Diagnosis not present

## 2021-09-30 DIAGNOSIS — C3411 Malignant neoplasm of upper lobe, right bronchus or lung: Secondary | ICD-10-CM | POA: Diagnosis not present

## 2021-10-01 ENCOUNTER — Ambulatory Visit: Payer: Medicare Other

## 2021-10-01 ENCOUNTER — Ambulatory Visit
Admission: RE | Admit: 2021-10-01 | Discharge: 2021-10-01 | Disposition: A | Discharge status: Home / Self Care | Payer: Medicare Other | Source: Ambulatory Visit | Attending: Radiation Oncology | Admitting: Radiation Oncology

## 2021-10-01 ENCOUNTER — Other Ambulatory Visit: Payer: Self-pay

## 2021-10-01 DIAGNOSIS — C7931 Secondary malignant neoplasm of brain: Secondary | ICD-10-CM | POA: Diagnosis not present

## 2021-10-01 DIAGNOSIS — C3490 Malignant neoplasm of unspecified part of unspecified bronchus or lung: Secondary | ICD-10-CM | POA: Diagnosis not present

## 2021-10-01 DIAGNOSIS — C7951 Secondary malignant neoplasm of bone: Secondary | ICD-10-CM | POA: Diagnosis not present

## 2021-10-01 DIAGNOSIS — Z510 Encounter for antineoplastic radiation therapy: Secondary | ICD-10-CM | POA: Diagnosis not present

## 2021-10-01 DIAGNOSIS — C3411 Malignant neoplasm of upper lobe, right bronchus or lung: Secondary | ICD-10-CM | POA: Diagnosis not present

## 2021-10-01 NOTE — Progress Notes (Signed)
Marcus Fuller rested with [name] for 30 minutes following his SRS treatment.  Patient denies headache, dizziness, nausea, diplopia or ringing in the ears. Denies fatigue. Patient without complaints. Understands to avoid strenuous activity for the next 24 hours and call [phone] with needs.   BP 96/60 (BP Location: Left Arm, Patient Position: Sitting, Cuff Size: Large)   Pulse 63   Temp 97.7 F (36.5 C)   Resp 20   SpO2 98%    Marcus Fuller M. [name], BSN

## 2021-10-04 ENCOUNTER — Telehealth: Payer: Self-pay | Admitting: *Deleted

## 2021-10-04 ENCOUNTER — Ambulatory Visit
Admission: RE | Admit: 2021-10-04 | Discharge: 2021-10-04 | Disposition: A | Discharge status: Home / Self Care | Payer: Medicare Other | Source: Ambulatory Visit | Attending: Radiation Oncology | Admitting: Radiation Oncology

## 2021-10-04 ENCOUNTER — Encounter: Payer: Self-pay | Admitting: Radiation Oncology

## 2021-10-04 ENCOUNTER — Other Ambulatory Visit: Payer: Self-pay

## 2021-10-04 DIAGNOSIS — C3411 Malignant neoplasm of upper lobe, right bronchus or lung: Secondary | ICD-10-CM | POA: Diagnosis not present

## 2021-10-04 DIAGNOSIS — C7951 Secondary malignant neoplasm of bone: Secondary | ICD-10-CM | POA: Diagnosis not present

## 2021-10-04 DIAGNOSIS — C7931 Secondary malignant neoplasm of brain: Secondary | ICD-10-CM | POA: Diagnosis not present

## 2021-10-04 DIAGNOSIS — Z510 Encounter for antineoplastic radiation therapy: Secondary | ICD-10-CM | POA: Diagnosis not present

## 2021-10-04 MED ORDER — DIGOXIN 125 MCG PO TABS: 125.0000 ug | ORAL_TABLET | ORAL | 11 refills | Status: DC

## 2021-10-04 NOTE — Telephone Encounter (Signed)
Called the patient and made him aware of the recent dose change for the Digoxin. Lab work will be completed with Dr. Irene Limbo.   Per Dr. Sallyanne Kuster: I think it is perfectly reasonable to reduce the digoxin to 4 days a week (take TTSS, skip MWF).  We can monitor his ventricular rate control readily using his pacemaker.

## 2021-10-05 ENCOUNTER — Ambulatory Visit: Payer: Medicare Other | Admitting: Radiation Oncology

## 2021-10-07 ENCOUNTER — Other Ambulatory Visit: Payer: Self-pay

## 2021-10-07 DIAGNOSIS — C3411 Malignant neoplasm of upper lobe, right bronchus or lung: Secondary | ICD-10-CM

## 2021-10-08 ENCOUNTER — Telehealth: Payer: Self-pay | Admitting: Nurse Practitioner

## 2021-10-08 ENCOUNTER — Other Ambulatory Visit: Payer: Self-pay

## 2021-10-08 ENCOUNTER — Inpatient Hospital Stay: Payer: Medicare Other | Admitting: Hematology

## 2021-10-08 ENCOUNTER — Inpatient Hospital Stay: Payer: Medicare Other

## 2021-10-08 VITALS — BP 145/92 | HR 89 | Temp 97.8°F | Resp 18 | Ht 71.0 in

## 2021-10-08 DIAGNOSIS — M47816 Spondylosis without myelopathy or radiculopathy, lumbar region: Secondary | ICD-10-CM | POA: Diagnosis not present

## 2021-10-08 DIAGNOSIS — C7931 Secondary malignant neoplasm of brain: Secondary | ICD-10-CM | POA: Insufficient documentation

## 2021-10-08 DIAGNOSIS — I2510 Atherosclerotic heart disease of native coronary artery without angina pectoris: Secondary | ICD-10-CM | POA: Insufficient documentation

## 2021-10-08 DIAGNOSIS — I482 Chronic atrial fibrillation, unspecified: Secondary | ICD-10-CM | POA: Diagnosis not present

## 2021-10-08 DIAGNOSIS — Z8249 Family history of ischemic heart disease and other diseases of the circulatory system: Secondary | ICD-10-CM | POA: Diagnosis not present

## 2021-10-08 DIAGNOSIS — I6782 Cerebral ischemia: Secondary | ICD-10-CM | POA: Diagnosis not present

## 2021-10-08 DIAGNOSIS — C3411 Malignant neoplasm of upper lobe, right bronchus or lung: Secondary | ICD-10-CM

## 2021-10-08 DIAGNOSIS — R410 Disorientation, unspecified: Secondary | ICD-10-CM

## 2021-10-08 DIAGNOSIS — G893 Neoplasm related pain (acute) (chronic): Secondary | ICD-10-CM | POA: Diagnosis not present

## 2021-10-08 DIAGNOSIS — Z7952 Long term (current) use of systemic steroids: Secondary | ICD-10-CM | POA: Diagnosis not present

## 2021-10-08 DIAGNOSIS — Z803 Family history of malignant neoplasm of breast: Secondary | ICD-10-CM | POA: Insufficient documentation

## 2021-10-08 DIAGNOSIS — Z7901 Long term (current) use of anticoagulants: Secondary | ICD-10-CM | POA: Diagnosis not present

## 2021-10-08 DIAGNOSIS — Z833 Family history of diabetes mellitus: Secondary | ICD-10-CM | POA: Insufficient documentation

## 2021-10-08 DIAGNOSIS — G20 Parkinson's disease: Secondary | ICD-10-CM | POA: Diagnosis not present

## 2021-10-08 DIAGNOSIS — Z87442 Personal history of urinary calculi: Secondary | ICD-10-CM | POA: Diagnosis not present

## 2021-10-08 DIAGNOSIS — C7951 Secondary malignant neoplasm of bone: Secondary | ICD-10-CM

## 2021-10-08 DIAGNOSIS — M48061 Spinal stenosis, lumbar region without neurogenic claudication: Secondary | ICD-10-CM | POA: Diagnosis not present

## 2021-10-08 DIAGNOSIS — Z820 Family history of epilepsy and other diseases of the nervous system: Secondary | ICD-10-CM | POA: Insufficient documentation

## 2021-10-08 DIAGNOSIS — E785 Hyperlipidemia, unspecified: Secondary | ICD-10-CM | POA: Diagnosis not present

## 2021-10-08 DIAGNOSIS — Z9049 Acquired absence of other specified parts of digestive tract: Secondary | ICD-10-CM | POA: Diagnosis not present

## 2021-10-08 DIAGNOSIS — D696 Thrombocytopenia, unspecified: Secondary | ICD-10-CM | POA: Insufficient documentation

## 2021-10-08 LAB — CBC WITH DIFFERENTIAL (CANCER CENTER ONLY)
Abs Immature Granulocytes: 0.23 10*3/uL — ABNORMAL HIGH (ref 0.00–0.07)
Basophils Absolute: 0 10*3/uL (ref 0.0–0.1)
Basophils Relative: 0 %
Eosinophils Absolute: 0 10*3/uL (ref 0.0–0.5)
Eosinophils Relative: 0 %
HCT: 49.5 % (ref 39.0–52.0)
Hemoglobin: 16 g/dL (ref 13.0–17.0)
Immature Granulocytes: 3 %
Lymphocytes Relative: 4 %
Lymphs Abs: 0.3 10*3/uL — ABNORMAL LOW (ref 0.7–4.0)
MCH: 28.6 pg (ref 26.0–34.0)
MCHC: 32.3 g/dL (ref 30.0–36.0)
MCV: 88.4 fL (ref 80.0–100.0)
Monocytes Absolute: 0.3 10*3/uL (ref 0.1–1.0)
Monocytes Relative: 4 %
Neutro Abs: 6.9 10*3/uL (ref 1.7–7.7)
Neutrophils Relative %: 89 %
Platelet Count: 84 10*3/uL — ABNORMAL LOW (ref 150–400)
RBC: 5.6 MIL/uL (ref 4.22–5.81)
RDW: 14.6 % (ref 11.5–15.5)
WBC Count: 7.8 10*3/uL (ref 4.0–10.5)
nRBC: 0 % (ref 0.0–0.2)

## 2021-10-08 LAB — CMP (CANCER CENTER ONLY)
ALT: 21 U/L (ref 0–44)
AST: 29 U/L (ref 15–41)
Albumin: 3.1 g/dL — ABNORMAL LOW (ref 3.5–5.0)
Alkaline Phosphatase: 91 U/L (ref 38–126)
Anion gap: 6 (ref 5–15)
BUN: 27 mg/dL — ABNORMAL HIGH (ref 8–23)
CO2: 25 mmol/L (ref 22–32)
Calcium: 8.6 mg/dL — ABNORMAL LOW (ref 8.9–10.3)
Chloride: 107 mmol/L (ref 98–111)
Creatinine: 0.81 mg/dL (ref 0.61–1.24)
GFR, Estimated: >60 mL/min (ref >60)
Glucose, Bld: 113 mg/dL — ABNORMAL HIGH (ref 70–99)
Potassium: 4.8 mmol/L (ref 3.5–5.1)
Sodium: 138 mmol/L (ref 135–145)
Total Bilirubin: 1.3 mg/dL — ABNORMAL HIGH (ref 0.3–1.2)
Total Protein: 5.2 g/dL — ABNORMAL LOW (ref 6.5–8.1)

## 2021-10-08 LAB — LACTATE DEHYDROGENASE: LDH: 151 U/L (ref 98–192)

## 2021-10-08 NOTE — Telephone Encounter (Signed)
Scheduled per 10/21 sch msg, pt has been called and wife confirmed appt

## 2021-10-09 ENCOUNTER — Ambulatory Visit: Payer: Medicare Other

## 2021-10-11 ENCOUNTER — Inpatient Hospital Stay (HOSPITAL_BASED_OUTPATIENT_CLINIC_OR_DEPARTMENT_OTHER): Payer: Medicare Other | Admitting: Nurse Practitioner

## 2021-10-11 ENCOUNTER — Other Ambulatory Visit: Payer: Self-pay | Admitting: Hematology

## 2021-10-11 ENCOUNTER — Other Ambulatory Visit: Payer: Self-pay

## 2021-10-11 ENCOUNTER — Inpatient Hospital Stay: Payer: Medicare Other

## 2021-10-11 ENCOUNTER — Other Ambulatory Visit: Payer: Self-pay | Admitting: Radiation Oncology

## 2021-10-11 VITALS — BP 118/78 | HR 81 | Temp 97.6°F | Resp 16

## 2021-10-11 DIAGNOSIS — C3411 Malignant neoplasm of upper lobe, right bronchus or lung: Secondary | ICD-10-CM

## 2021-10-11 DIAGNOSIS — Z9049 Acquired absence of other specified parts of digestive tract: Secondary | ICD-10-CM | POA: Diagnosis not present

## 2021-10-11 DIAGNOSIS — I2510 Atherosclerotic heart disease of native coronary artery without angina pectoris: Secondary | ICD-10-CM | POA: Diagnosis not present

## 2021-10-11 DIAGNOSIS — M48061 Spinal stenosis, lumbar region without neurogenic claudication: Secondary | ICD-10-CM | POA: Diagnosis not present

## 2021-10-11 DIAGNOSIS — C7951 Secondary malignant neoplasm of bone: Secondary | ICD-10-CM

## 2021-10-11 DIAGNOSIS — M47816 Spondylosis without myelopathy or radiculopathy, lumbar region: Secondary | ICD-10-CM | POA: Diagnosis not present

## 2021-10-11 DIAGNOSIS — Z7952 Long term (current) use of systemic steroids: Secondary | ICD-10-CM | POA: Diagnosis not present

## 2021-10-11 DIAGNOSIS — Z833 Family history of diabetes mellitus: Secondary | ICD-10-CM | POA: Diagnosis not present

## 2021-10-11 DIAGNOSIS — G20 Parkinson's disease: Secondary | ICD-10-CM | POA: Diagnosis not present

## 2021-10-11 DIAGNOSIS — E785 Hyperlipidemia, unspecified: Secondary | ICD-10-CM | POA: Diagnosis not present

## 2021-10-11 DIAGNOSIS — D696 Thrombocytopenia, unspecified: Secondary | ICD-10-CM | POA: Diagnosis not present

## 2021-10-11 DIAGNOSIS — Z87442 Personal history of urinary calculi: Secondary | ICD-10-CM | POA: Diagnosis not present

## 2021-10-11 DIAGNOSIS — C7931 Secondary malignant neoplasm of brain: Secondary | ICD-10-CM | POA: Diagnosis not present

## 2021-10-11 DIAGNOSIS — I6782 Cerebral ischemia: Secondary | ICD-10-CM | POA: Diagnosis not present

## 2021-10-11 DIAGNOSIS — I482 Chronic atrial fibrillation, unspecified: Secondary | ICD-10-CM | POA: Diagnosis not present

## 2021-10-11 DIAGNOSIS — Z515 Encounter for palliative care: Secondary | ICD-10-CM | POA: Diagnosis not present

## 2021-10-11 DIAGNOSIS — Z8249 Family history of ischemic heart disease and other diseases of the circulatory system: Secondary | ICD-10-CM | POA: Diagnosis not present

## 2021-10-11 DIAGNOSIS — G893 Neoplasm related pain (acute) (chronic): Secondary | ICD-10-CM | POA: Diagnosis not present

## 2021-10-11 DIAGNOSIS — Z803 Family history of malignant neoplasm of breast: Secondary | ICD-10-CM | POA: Diagnosis not present

## 2021-10-11 DIAGNOSIS — Z7901 Long term (current) use of anticoagulants: Secondary | ICD-10-CM | POA: Diagnosis not present

## 2021-10-11 LAB — URINALYSIS, COMPLETE (UACMP) WITH MICROSCOPIC
Bacteria, UA: NONE SEEN
Bilirubin Urine: NEGATIVE
Glucose, UA: NEGATIVE mg/dL
Hgb urine dipstick: NEGATIVE
Ketones, ur: NEGATIVE mg/dL
Leukocytes,Ua: NEGATIVE
Nitrite: NEGATIVE
Protein, ur: NEGATIVE mg/dL
Specific Gravity, Urine: 1.009 (ref 1.005–1.030)
pH: 6 (ref 5.0–8.0)

## 2021-10-11 LAB — DIGOXIN LEVEL: Digoxin Level: 0.6 ng/mL — ABNORMAL LOW (ref 0.8–2.0)

## 2021-10-11 LAB — KAPPA/LAMBDA LIGHT CHAINS
Kappa free light chain: 11.2 mg/L (ref 3.3–19.4)
Kappa, lambda light chain ratio: 2.67 — ABNORMAL HIGH (ref 0.26–1.65)
Lambda free light chains: 4.2 mg/L — ABNORMAL LOW (ref 5.7–26.3)

## 2021-10-11 MED ORDER — HYDROMORPHONE HCL 2 MG PO TABS: 2.0000 mg | ORAL_TABLET | ORAL | 0 refills | Status: DC | PRN

## 2021-10-11 MED ORDER — SODIUM CHLORIDE 0.9 % IV SOLN: Freq: Once | INTRAVENOUS | Status: AC

## 2021-10-11 MED ORDER — ZOLEDRONIC ACID 4 MG/100ML IV SOLN
4.0000 mg | Freq: Once | INTRAVENOUS | Status: AC
  Administered 2021-10-11: 4 mg via INTRAVENOUS
  Filled 2021-10-11: qty 100

## 2021-10-11 MED ORDER — POLYETHYLENE GLYCOL 3350 17 G PO PACK: 17.0000 g | PACK | Freq: Every day | ORAL | 0 refills | Status: DC

## 2021-10-11 NOTE — Progress Notes (Signed)
[facility]  Telephone:(336) [phone] Fax:(336) [phone]   Name: Marcus Fuller Date: 10/11/2021 MRN: [id number]  DOB: 1941/05/07  Patient Care Team: Cari Caraway, MD as PCP - General (Family Medicine) Sanda Klein, MD as PCP - Cardiology (Cardiology) Jola Baptist, [place] as Referring Physician (Chiropractic Medicine)    REASON FOR CONSULTATION: Marcus Fuller is a 80 y.o. male with multiple medical problems including hypertension, Parkinson's disease, CAD s/p PCI (2007), pacemaker, chronic low back pain, and atrial fibrillation (Digoxin and Eliquis). Patient with newly diagnosed metastatic lung adenocarcinoma with frontal lobe brain mets and bone lesions. Palliative ask to see for symptom management and goals of care.    SOCIAL HISTORY:     reports that he has never smoked. He has never used smokeless tobacco. He reports that he does not currently use alcohol. He reports that he does not use drugs.  ADVANCE DIRECTIVES:  Patient does not have completed advanced directives.  Discussed at length with both he and his wife.  Wife confirms that have advanced directive packet and plans to review.  She will reach out if they have any further questions.  Advanced directive clinic dates provided and wife will let us know if she would like a referral to the clinic.  CODE STATUS:   PAST MEDICAL HISTORY: Past Medical History:  Diagnosis Date   Anxiety    Cataract    beginning stages   Chronic low back pain 07/03/2019   Coronary artery disease    stent- 2007    History of kidney stones    Hypertension    Parkinson's disease ([place]) 01/29/2018   Presence of permanent cardiac pacemaker    2017    Tremor of right hand     PAST SURGICAL HISTORY:  Past Surgical History:  Procedure Laterality Date   APPENDECTOMY     bbb     CARDIOVERSION N/A 04/19/2021   Procedure: CARDIOVERSION;  Surgeon: Lelon Perla, MD;  Location: [facility] ENDOSCOPY;   Service: Cardiovascular;  Laterality: N/A;   CHOLECYSTECTOMY N/A 05/21/2018   Procedure: LAPAROSCOPIC CHOLECYSTECTOMY;  Surgeon: Coralie Keens, MD;  Location: [facility] ORS;  Service: General;  Laterality: N/A;   left knee arthroscopy   [place]  11/2016   STENT PLACEMENT VASCULAR ([place] [facility])  2007   TONSILLECTOMY      HEMATOLOGY/ONCOLOGY HISTORY:  Oncology History  Primary adenocarcinoma of upper lobe of right lung ([place])  09/08/2021 Initial Diagnosis   Primary adenocarcinoma of upper lobe of right lung ([place])   09/22/2021 Cancer Staging   Staging form: Lung, AJCC 8th Edition - Clinical: Stage IVB (cT1b, cNX, cM1c) - Signed by Brunetta Genera, MD on 09/22/2021 Histologic grade (G): GX Histologic grading system: 4 grade system      ALLERGIES:  is allergic to atorvastatin and codeine.  MEDICATIONS:  Current Outpatient Medications  Medication Sig Dispense Refill   acetaminophen (TYLENOL) 325 MG tablet Take 650 mg by mouth every 6 (six) hours as needed.     carbidopa-levodopa (SINEMET) 25-250 MG tablet Take 1 tablet by mouth 3 (three) times daily. 270 tablet 3   dexamethasone (DECADRON) 4 MG tablet Take 1 tablet (4 mg total) by mouth 2 (two) times daily. 60 tablet 0   digoxin (LANOXIN) 0.125 MG tablet Take 1 tablet (125 mcg total) by mouth 4 (four) times a week. Take Tuesday, Thursday, Saturday and Sunday 16 tablet 11   ELIQUIS 5 MG TABS tablet TAKE 1  TABLET BY MOUTH TWICE A DAY 60 tablet 5   gabapentin (NEURONTIN) 600 MG tablet 1/2 tablet in the morning and midday, 1 at night 180 tablet 3   HYDROmorphone (DILAUDID) 2 MG tablet Take 1 tablet (2 mg total) by mouth every 4 (four) hours as needed for severe pain. 30 tablet 0   Melatonin 10 MG TABS Take 10 mg by mouth at bedtime.     Naproxen Sodium 220 MG CAPS      osimertinib mesylate (TAGRISSO) 80 MG tablet Take 1 tablet (80 mg total) by mouth daily. 30 tablet 1   OVER THE COUNTER MEDICATION Apply 1 application topically  at bedtime. Outback Relief     senna-docusate (SENNA S) 8.6-50 MG tablet Take 2 tablets by mouth at bedtime. 60 tablet 1   No current facility-administered medications for this visit.    VITAL SIGNS: BP 118/78 (BP Location: Left Arm, Patient Position: Sitting)   Pulse 81   Temp 97.6 F (36.4 C) (Oral)   Resp 16   SpO2 100%  Filed Weights    Estimated body mass index is 26.96 kg/m as calculated from the following:   Height as of 10/08/21: 5\' 11"  (1.803 m).   Weight as of 09/16/21: 193 lb 4.8 oz (87.7 kg).  LABS: CBC:    Component Value Date/Time   WBC 7.8 10/08/2021 1043   WBC 9.4 08/19/2021 1302   HGB 16.0 10/08/2021 1043   HGB 15.5 12/28/2018 1245   HCT 49.5 10/08/2021 1043   HCT 46.3 12/28/2018 1245   PLT 84 (L) 10/08/2021 1043   PLT 165 12/28/2018 1245   MCV 88.4 10/08/2021 1043   MCV 84 12/28/2018 1245   NEUTROABS 6.9 10/08/2021 1043   NEUTROABS 6.3 12/28/2018 1245   LYMPHSABS 0.3 (L) 10/08/2021 1043   LYMPHSABS 2.1 12/28/2018 1245   MONOABS 0.3 10/08/2021 1043   EOSABS 0.0 10/08/2021 1043   EOSABS 0.2 12/28/2018 1245   BASOSABS 0.0 10/08/2021 1043   BASOSABS 0.1 12/28/2018 1245   Comprehensive Metabolic Panel:    Component Value Date/Time   NA 138 10/08/2021 1115   NA 141 07/06/2020 0949   K 4.8 10/08/2021 1115   CL 107 10/08/2021 1115   CO2 25 10/08/2021 1115   BUN 27 (H) 10/08/2021 1115   BUN 16 07/06/2020 0949   CREATININE 0.81 10/08/2021 1115   GLUCOSE 113 (H) 10/08/2021 1115   CALCIUM 8.6 (L) 10/08/2021 1115   AST 29 10/08/2021 1115   ALT 21 10/08/2021 1115   ALKPHOS 91 10/08/2021 1115   BILITOT 1.3 (H) 10/08/2021 1115   PROT 5.2 (L) 10/08/2021 1115   PROT 6.6 12/28/2018 1245   ALBUMIN 3.1 (L) 10/08/2021 1115   ALBUMIN 4.4 12/28/2018 1245    RADIOGRAPHIC STUDIES: MR Brain W Wo Contrast  Result Date: 09/25/2021 CLINICAL DATA:  Brain/CNS neoplasm, staging. SRS treatment planning. Non-small cell lung cancer. EXAM: MRI HEAD WITHOUT AND WITH  CONTRAST TECHNIQUE: Multiplanar, multiecho pulse sequences of the brain and surrounding structures were obtained without and with intravenous contrast. CONTRAST:  79mL GADAVIST GADOBUTROL 1 MMOL/ML IV SOLN COMPARISON:  MR Head 09/13/2021. FINDINGS: Brain: There is no evidence of an acute infarct, intracranial hemorrhage, midline shift, or extra-axial fluid collection. Patchy T2 hyperintensities throughout the cerebral white matter bilaterally are unchanged and nonspecific but compatible with moderate to severe chronic small vessel ischemic disease. There is mild generalized cerebral atrophy. Three ring-enhancing lesions in the right frontal lobe are unchanged from the recent prior, measuring 9  mm (series 20, image 113), 10 mm (series 20, image 117), and 8 mm (series 20, image 133). There is no significant associated edema, and no new lesions are identified. A 9 mm homogeneously enhancing extra-axial mass in the right middle cranial fossa is unchanged (series 20, image 85). Vascular: Major intracranial vascular flow voids are preserved. Skull and upper cervical spine: Unremarkable bone marrow signal. Sinuses/Orbits: Bilateral cataract extraction. Paranasal sinuses and mastoid air cells are clear. Other: None. IMPRESSION: 1. Three unchanged metastases in the right frontal lobe. No new lesions identified. 2. Unchanged 9 mm extra-axial mass in the right middle cranial fossa most consistent with a meningioma. 3. Moderate to severe chronic small vessel ischemic disease. Electronically Signed   By: Logan Bores M.D.   On: 09/25/2021 10:03   MR Brain W Wo Contrast  Result Date: 09/14/2021 CLINICAL DATA:  Non-small-cell lung cancer, staging EXAM: MRI HEAD WITHOUT AND WITH CONTRAST TECHNIQUE: Multiplanar, multiecho pulse sequences of the brain and surrounding structures were obtained without and with intravenous contrast. CONTRAST:  27mL GADAVIST GADOBUTROL 1 MMOL/ML IV SOLN COMPARISON:  None. FINDINGS: Brain: Multiple  enhancing lesions are present in the brain compatible with metastatic disease. 9 mm ring-enhancing lesion right frontal lobe. Adjacent but separate ring-enhancing lesion right frontal lobe measuring 9 cm. Mild associated edema. 7 mm ring-enhancing lesion right frontal lobe over the convexity with mild enhancement. No associated hemorrhage in these lesions. 9 mm dural-based enhancing lesion right anterior middle cranial fossa compatible with meningioma. This appears extra-axial. Mild frontal atrophy. Chronic microvascular ischemic change in the white matter, moderate in degree. No acute infarct or hemorrhage. Vascular: Normal arterial flow voids Skull and upper cervical spine: No worrisome skeletal lesion. Sinuses/Orbits: Bilateral cataract extraction. Paranasal sinuses clear Other: None IMPRESSION: Three metastatic deposits in the brain in the right frontal lobe. Mild associated edema. 9 mm meningioma right middle cranial fossa. Atrophy and chronic microvascular ischemic change. Electronically Signed   By: Franchot Gallo M.D.   On: 09/14/2021 09:32   CUP PACEART REMOTE DEVICE CHECK  Result Date: 09/15/2021 Scheduled remote reviewed. Normal device function.  Alert: AT/AF >= 6 hr for 2 days. Persistent AF since 0514/22. +OAC per prior report. Effective BiV paced 96.6%. OptiVol stable. Next remote 91 days.   PERFORMANCE STATUS (ECOG) : 3 - Symptomatic, >50% confined to bed  Review of Systems  Gastrointestinal:  Positive for constipation.  Musculoskeletal:  Positive for arthralgias.  Neurological:  Positive for weakness.  Unless otherwise noted, a complete review of systems is negative.  Physical Exam General: NAD, sitting in wheelchair  Cardiovascular: regular rate and rhythm Pulmonary: clear ant fields, normal breathing pattern Abdomen: soft, nontender, + bowel sounds Neurological: AAO x3, mood appropriate   IMPRESSION:  This is my first encounter with Marcus Fuller and his wife.  He is sitting up  in a wheelchair.  I introduced myself and palliative's role as part of his oncology care team.  Patient and wife verbalized understanding and appreciation.  Marcus Fuller shares he and his wife have been married for over 31 years.  This is a second marriage for both of them.  They have 3 children and 6 grandchildren (31-month-old twins).  He is a retired Training and development officer.  Patient and wife shares he is mainly confined to the chair or wheelchair.  Will ambulate short distances in the home with a walker, however his hip pain and instability poses a high fall risk.  His wife assists with all ADLs (bathing and dressing).  Appetite is good.  Complains of frequent urination during the night in addition to poor sleep cycle.  Does endorse melatonin helps.  Endorses generalized weakness and fatigue with exertion.  Marcus Fuller states most of his pain is in his right hip.  He is unable to lift his right leg without assistance.  We discussed current pain regimen.  Although he does not achieve total relief does feel as though the hydromorphone is effective when he takes it.  Wife reports he averages 1-2 tablets/day.  Discussed ability to take medication during the day to gain better pain control.  He states he is only having 1 bowel movement every few days.  Education provided on bowel regimen in the setting of opioid use and decreased mobility.  He is currently taking Senokot.  Recommendations to begin taking MiraLAX daily with an option to take twice a day if no improvement.  I empathetically approach discussions regarding goals of care and advanced directives.  Wife states she has advanced directive packet and knows this is important for them to begin discussing.  Advised to reach out if she has any questions regarding content of document as we can discuss at future appointment.  Education also provided regarding the advanced directive clinic and dates.  Wife verbalized understanding and appreciation.  Marcus Fuller states he is not  one to express his feelings much however he does have some anxiety and fear of the unknown.  He states "I just need a win".  Allow him time to elaborate on his statement.  He shares he continues to get not so good news and is hopeful all of the treatments and interventions that he has been going through has a least provided him with some improvement/stability.  He and his wife are hopeful that recent radiation treatments targeted to his brain lesions.  Emotional support provided.  PLAN: Continue hydromorphone as prescribed by Dr. Irene Limbo.  Refill authorized. Patient will add MiraLAX to daily bowel regimen for constipation in the setting of opioid use and decreased mobility. Patient and wife inquiring about continuing dexamethasone.  Will defer to radiation oncology. Encouraged continued conversations with family and medical providers regarding overall plan of care and treatment options, ensuring decisions are within the context of the patients values and GOCs.  Patient and wife planning to move forward with completion of advance directives.  Will call with any questions regarding content of document. I will plan to see patient back in collaboration with future oncology/treatment visits (11/08/2021).   Patient expressed understanding and was in agreement with this plan. He also understands that He can call the clinic at any time with any questions, concerns, or complaints.     Time Total: 45 min.   Visit consisted of counseling and education dealing with the complex and emotionally intense issues of symptom management and palliative care in the setting of serious and potentially life-threatening illness.Greater than 50%  of this time was spent counseling and coordinating care related to the above assessment and plan.  Signed by: Alda Lea, AGPCNP-BC Palliative Medicine Team

## 2021-10-11 NOTE — Progress Notes (Signed)
Digoxin level and EKG orders

## 2021-10-11 NOTE — Patient Instructions (Signed)

## 2021-10-11 NOTE — Telephone Encounter (Signed)
Called patient wife okay per DPR, gave response from MD below.  Patient wife verbalized understanding.

## 2021-10-11 NOTE — Telephone Encounter (Signed)
Patient's wife calling to find out the reason for the call last week.

## 2021-10-12 ENCOUNTER — Telehealth: Payer: Self-pay | Admitting: Radiation Therapy

## 2021-10-12 ENCOUNTER — Other Ambulatory Visit: Payer: Self-pay | Admitting: Radiation Oncology

## 2021-10-12 ENCOUNTER — Encounter: Payer: Self-pay | Admitting: Radiation Oncology

## 2021-10-12 ENCOUNTER — Telehealth: Payer: Self-pay

## 2021-10-12 ENCOUNTER — Encounter: Payer: Self-pay | Admitting: Hematology

## 2021-10-12 LAB — MULTIPLE MYELOMA PANEL, SERUM
Albumin SerPl Elph-Mcnc: 3 g/dL (ref 2.9–4.4)
Albumin/Glob SerPl: 1.6 (ref 0.7–1.7)
Alpha 1: 0.1 g/dL (ref 0.0–0.4)
Alpha2 Glob SerPl Elph-Mcnc: 0.6 g/dL (ref 0.4–1.0)
B-Globulin SerPl Elph-Mcnc: 0.8 g/dL (ref 0.7–1.3)
Gamma Glob SerPl Elph-Mcnc: 0.4 g/dL (ref 0.4–1.8)
Globulin, Total: 1.9 g/dL — ABNORMAL LOW (ref 2.2–3.9)
IgA: 63 mg/dL (ref 61–437)
IgG (Immunoglobin G), Serum: 518 mg/dL — ABNORMAL LOW (ref 603–1613)
IgM (Immunoglobulin M), Srm: 45 mg/dL (ref 15–143)
Total Protein ELP: 4.9 g/dL — ABNORMAL LOW (ref 6.0–8.5)

## 2021-10-12 NOTE — Telephone Encounter (Signed)
Called to check in on Marcus Fuller after the completion of his brain SRS. Marcus Fuller was resting so I spoke with his wife, Marcus Fuller. He is doing well, still very tired and sleeping a lot but no new or worsening symptoms since his treatment. He has not started a taper for his steroid, I have reached out to Shona Simpson PA-C and Dr. Ida Rogue nurse Phineas Real, requesting taper instructions. Marcus Fuller also mentioned not knowing when to begin taking the Tagrisso prescribed by Dr. Irene Limbo. I spoke with Beth, Dr. Grier Mitts nurse, requesting she follow-up with the patient about this.   [name] was encouraged to call if she has any other questions or concerns.   Mont Dutton R.T.(R)(T) Radiation Special Procedures Navigator

## 2021-10-12 NOTE — Progress Notes (Signed)
  Radiation Oncology         [phone] ________________________________  Name: Marcus Fuller  LLV:[id number]  Date of Service: 10/12/21  DOB: December 08, 1941   Steroid Taper Instructions   You currently have a prescription for Dexamethasone 4 mg Tablets.    Beginning 10/13/21: Take 1/2 of a tablet (which is 2 mg) twice a day  Beginning 10/20/21: Take 1/2 of a tablet (which is 2 mg) once a day  Beginning 10/27/21: Take 1/2 of a tablet (which is 2 mg) every other day and stop on 11/01/21.   Please call our office if you have any headaches, visual changes, uncontrolled movements, nausea or vomiting.

## 2021-10-12 NOTE — Telephone Encounter (Signed)
Left pt's wife a message in reference to her question about pt starting Tagrisso. Pt is to start medication if he hasn't already.

## 2021-10-12 NOTE — Telephone Encounter (Signed)
Left a detailed voice message with steroid taper instructions given by Shona Simpson PA-C. Included my contact information to call back with any questions.     Name: Marcus Fuller         EPP:[id number]   Date of Service: 10/12/21  DOB: 03-15-1941     Steroid Taper Instructions     You currently have a prescription for Dexamethasone 4 mg Tablets.      Beginning 10/13/21: Take 1/2 of a tablet (which is 2 mg) twice a day   Beginning 10/20/21: Take 1/2 of a tablet (which is 2 mg) once a day   Beginning 10/27/21: Take 1/2 of a tablet (which is 2 mg) every other day and stop on 11/01/21.     Mont Dutton R.T.(R)(T) Radiation Special Procedures Navigator

## 2021-10-13 ENCOUNTER — Telehealth: Payer: Self-pay

## 2021-10-13 NOTE — Telephone Encounter (Signed)
Contacted pt's wife regarding taking Tagrisso. Pt had EKG done at the end of August and will have one on 10/21/21 at next visit. Per Dr Irene Limbo pt ok to start [place]. Wife verbalized understanding.

## 2021-10-13 NOTE — Telephone Encounter (Signed)
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   Counseled on administration, dosing, side effects, monitoring, drug-food interactions, safe handling, storage, and disposal.  Patient will take Tagrisso 80 tablets, 1 tablet by mouth once daily, without regard to food.  Tagrisso start date: 10/13/21  Adverse effects include but are not limited to: diarrhea, mouth sores, decreased appetitie, fatigue, dry skin, rash, nail changes, altered cardiac conduction, and decreased blood counts or electrolytes.   Reviewed with patient importance of keeping a medication schedule and plan for any missed doses. No barriers to medication adherence identified.  Medication reconciliation performed and medication/allergy list updated. Wife is aware of drug-drug interaction between Marcus Fuller and digoxin. Patient's digoxin dose has been reduced by cardiology and baseline digoxin level was obtained on 10/11/21.  Insurance authorization for [name] has been obtained. Due to high unaffordable copay patient has been enrolled in manufacturer assistance through [employer]. Patient has already received medication from manufacturer.  All questions answered.  Ms. Molchan voiced understanding and appreciation.   Medication education handout placed in mail for patient. Patient knows to call the office with questions or concerns. Oral Chemotherapy Clinic phone number provided to patient.   Leron Croak, PharmD, BCPS Hematology/Oncology Clinical Pharmacist [facility] [phone] 10/13/2021 10:01 AM

## 2021-10-14 ENCOUNTER — Encounter: Payer: Self-pay | Admitting: Hematology

## 2021-10-14 NOTE — Progress Notes (Addendum)
Marcus Kitchen   HEMATOLOGY/ONCOLOGY CLINIC NOTE  Date of Service: .10/08/2021   Patient Care Team: Marcus Caraway, MD as PCP - General (Family Medicine) Fuller, Marcus Gobble, MD as PCP - Cardiology (Cardiology) Marcus Baptist, Fuller as Referring Physician (Chiropractic Medicine) Pickenpack-Cousar, Marcus Sax, NP as Nurse Practitioner (Nurse Practitioner)  CHIEF COMPLAINTS/PURPOSE OF CONSULTATION:  Newly diagnosed metastatic lung adenocarcinoma with bone mets  HISTORY OF PRESENTING ILLNESS:   Marcus Fuller is a wonderful 80 y.o. male who has been referred to [name] by Dr Marcus Fuller for evaluation and management of newly noted pelvic mass/bone metastases.  Patient has a history of hypertension, Parkinson's disease [follows with Dr. Juanda Crumble Fuller], coronary artery disease status post PCI in 2007, history of kidney stones, presence of permanent cardiac pacemaker who has history of chronic low back pain. Patient notes that over the last couple of months he has had increasing low back pain had an MRI of the lumbar spine without contrast ordered by Dr. Junius Fuller on 08/03/2021.  MRI lumbar spine showed  1. Expansile lesion in the right iliac bone is concerning for malignancy, particularly metastatic disease; correlate with any history of malignancy. If there is no history of malignancy, oncologic workup and dedicated MRI of the hip with and without contrast are recommended for further evaluation. 2. Two small foci of signal abnormality in the L2 and L3 vertebral bodies are suspicious for additional small metastases. MRI of the lumbar spine with and without contrast may be helpful for better characterization. 3. Mild multilevel degenerative changes of the lumbar spine detailed above resulting in up to moderate right neural foraminal stenosis at L4-L5. Otherwise, no high-grade spinal canal or neural foraminal stenosis. 4. Trace free fluid in the abdomen, nonspecific.  Patient notes some pain on the right  posterior hip and upper thigh.  No pain radiating into his lower extremities.  No lower extremity weakness that is new.  No loss of bowel or bladder control.  Patient does note chronic weakness decreased mobility due to has Parkinson's disease.  He is on Sinemet for this.  He notes that at baseline he uses a rolling walker but has had difficulty lifting his right leg due to pain.  He has had chronic low back pain in the past and has been seen by Dr. Herma [facility] at [facility] and has previously received 2 injections in his spine for pain management.  He also has a history of chronic atrial fibrillation anticoagulation and digoxin.  Patient notes no significant weight loss in the last 3 months. . Wt Readings from Last 3 Encounters:  09/16/21 193 lb 4.8 oz (87.7 kg)  09/08/21 195 lb (88.5 kg)  08/19/21 197 lb 8 oz (89.6 kg)   No other focal neurological deficits no headaches. No shortness of breath or new chest pain. No change in urinary or bowel habits. No previous history of any cancers.   INTERVAL HISTORY  Mr. [facility] is here with his wife for continued management of his newly diagnosed metastatic lung adenocarcinoma.  Patient's wife notes that he does have some confusion after his SRS to the brain.  He has not been sleeping well and we discussed taking his His dexamethasone with breakfast in the morning and then with lunch in the afternoon and not later in the day.  Patient is somewhat better controlled and he is using his Dilaudid as needed with some relief.  No new headaches.  No fevers no chills no night sweats.  No acute new shortness of breath. Patient has received  his Tagrisso. We discussed his cardiologist recommendation to reduce the digoxin to 4 days a week. We shall be getting a digoxin level and starting his Tagrisso from 10/11/2021  No other acute new symptoms.  Labs done today 10/08/2021 reviewed.   MEDICAL HISTORY:  Past Medical History:  Diagnosis Date   Anxiety     Cataract    beginning stages   Chronic low back pain 07/03/2019   Coronary artery disease    stent- 2007    History of kidney stones    Hypertension    Parkinson's disease ([place]) 01/29/2018   Presence of permanent cardiac pacemaker    2017    Tremor of right hand     SURGICAL HISTORY: Past Surgical History:  Procedure Laterality Date   APPENDECTOMY     bbb     CARDIOVERSION N/A 04/19/2021   Procedure: CARDIOVERSION;  Surgeon: Marcus Perla, MD;  Location: [facility];  Service: Cardiovascular;  Laterality: N/A;   CHOLECYSTECTOMY N/A 05/21/2018   Procedure: LAPAROSCOPIC CHOLECYSTECTOMY;  Surgeon: Marcus Keens, MD;  Location: [facility];  Service: General;  Laterality: N/A;   left knee arthroscopy   1996   PACEMAKER INSERTION  11/2016   STENT PLACEMENT VASCULAR ([facility])  2007   TONSILLECTOMY      SOCIAL HISTORY: Social History   Socioeconomic History   Marital status: Married    Spouse name: Marcus [facility]   Number of children: 2   Years of education: 14   Highest education level: Not on file  Occupational History   Not on file  Tobacco Use   Smoking status: Never   Smokeless tobacco: Never  Vaping Use   Vaping Use: Never used  Substance and Sexual Activity   Alcohol use: Not Currently    Comment: social    Drug use: No   Sexual activity: Not Currently  Other Topics Concern   Not on file  Social History Narrative   Lives w/ wife   Caffeine use: sometimes   Right handed    Social Determinants of Health   Financial Resource Strain: Not on file  Food Insecurity: Not on file  Transportation Needs: Not on file  Physical Activity: Not on file  Stress: Not on file  Social Connections: Not on file  Intimate Partner Violence: Not on file    FAMILY HISTORY: Family History  Problem Relation Age of Onset   Heart disease Mother    Diabetes Mother    Cancer Mother        breast   Heart disease Father    Heart attack Father    Atrial fibrillation Sister     Heart disease Brother    Pancreatic disease Brother    Tremor Brother    Heart attack Paternal Grandfather    Spina bifida Brother     ALLERGIES:  is allergic to atorvastatin and codeine.  MEDICATIONS:  Current Outpatient Medications  Medication Sig Dispense Refill   acetaminophen (TYLENOL) 325 MG tablet Take 650 mg by mouth every 6 (six) hours as needed.     carbidopa-levodopa (SINEMET) 25-250 MG tablet Take 1 tablet by mouth 3 (three) times daily. 270 tablet 3   dexamethasone (DECADRON) 4 MG tablet TAKE 1 TABLET BY MOUTH 2 TIMES DAILY. 60 tablet 0   digoxin (LANOXIN) 0.125 MG tablet Take 1 tablet (125 mcg total) by mouth 4 (four) times a week. Take Tuesday, Thursday, Saturday and Sunday 16 tablet 11   ELIQUIS 5 MG TABS tablet TAKE 1 TABLET  BY MOUTH TWICE A DAY 60 tablet 5   gabapentin (NEURONTIN) 600 MG tablet 1/2 tablet in the morning and midday, 1 at night 180 tablet 3   HYDROmorphone (DILAUDID) 2 MG tablet Take 1 tablet (2 mg total) by mouth every 4 (four) hours as needed for severe pain. 30 tablet 0   Melatonin 10 MG TABS Take 10 mg by mouth at bedtime.     osimertinib mesylate (TAGRISSO) 80 MG tablet Take 1 tablet (80 mg total) by mouth daily. 30 tablet 1   polyethylene glycol (MIRALAX) 17 g packet Take 17 g by mouth daily. 30 each 0   senna-docusate (SENNA S) 8.6-50 MG tablet Take 2 tablets by mouth at bedtime. 60 tablet 1   No current facility-administered medications for this visit.    REVIEW OF SYSTEMS:    .10 Point review of Systems was done is negative except as noted above.   PHYSICAL EXAMINATION: ECOG PERFORMANCE STATUS: 3 - Symptomatic, >50% confined to bed  . Vitals:   10/08/21 1152  BP: (!) 145/92  Pulse: 89  Resp: 18  Temp: 97.8 F (36.6 C)  SpO2: 95%   Filed Weights   .Body mass index is 26.96 kg/m. . . GENERAL:alert, in no acute distress and comfortable SKIN: no acute rashes, no significant lesions EYES: conjunctiva are pink and non-injected,  sclera anicteric OROPHARYNX: MMM, no exudates, no oropharyngeal erythema or ulceration NECK: supple, no JVD LYMPH:  no palpable lymphadenopathy in the cervical, axillary or inguinal regions LUNGS: clear to auscultation b/l with normal respiratory effort HEART: regular rate & rhythm ABDOMEN:  normoactive bowel sounds , non tender, not distended. Extremity: no pedal edema PSYCH: alert & oriented x 3 with fluent speech NEURO: no focal motor/sensory deficits   LABORATORY DATA:  I have reviewed the data as listed  . CBC Latest Ref Rng & Units 10/08/2021 09/13/2021 08/19/2021  WBC 4.0 - 10.5 K/uL 7.8 9.6 9.4  Hemoglobin 13.0 - 17.0 g/dL 16.0 15.8 15.8  Hematocrit 39.0 - 52.0 % 49.5 49.8 48.0  Platelets 150 - 400 K/uL 84(L) 188 166    . CMP Latest Ref Rng & Units 10/08/2021 09/13/2021 08/19/2021  Glucose 70 - 99 mg/dL 113(H) 112(H) 110(H)  BUN 8 - 23 mg/dL 27(H) 17 13  Creatinine 0.61 - 1.24 mg/dL 0.81 0.84 0.86  Sodium 135 - 145 mmol/L 138 140 142  Potassium 3.5 - 5.1 mmol/L 4.8 4.1 4.2  Chloride 98 - 111 mmol/L 107 105 107  CO2 22 - 32 mmol/L _0 Calcium 8.9 - 10.3 mg/dL 8.6(L) 10.5(H) 10.1  Total Protein 6.5 - 8.1 g/dL 5.2(L) 6.7 6.4(L)  Total Bilirubin 0.3 - 1.2 mg/dL 1.3(H) 1.2 1.1  Alkaline Phos 38 - 126 U/L 91 306(H) 184(H)  AST 15 - 41 U/L _1 ALT 0 - 44 U/L _2 Component     Latest Ref Rng & Units 10/11/2021  Color, Urine     YELLOW YELLOW  Appearance     CLEAR CLEAR  Specific Gravity, Urine     1.005 - 1.030 1.009  pH     5.0 - 8.0 6.0  Glucose, UA     NEGATIVE mg/dL NEGATIVE  Hgb urine dipstick     NEGATIVE NEGATIVE  Bilirubin Urine     NEGATIVE NEGATIVE  Ketones, ur     NEGATIVE mg/dL NEGATIVE  Protein     NEGATIVE mg/dL NEGATIVE  Nitrite     NEGATIVE NEGATIVE  Leukocytes,Ua     NEGATIVE NEGATIVE  RBC / HPF     0 - 5 RBC/hpf 0-5  WBC, UA     0 - 5 WBC/hpf 0-5  Bacteria, UA     NONE SEEN NONE SEEN  Squamous Epithelial / LPF     0 -  5 0-5  Mucus      PRESENT  Digoxin, Serum     0.8 - 2.0 ng/mL 0.6 (L)    SURGICAL PATHOLOGY  CASE: [id number]  PATIENT: [facility]  Surgical Pathology Report      Clinical History: Painful right pelvic bone lesion at juncture of iliac  bone and ischium. Elevated free kappa light chains. 5 cm right  iliac/ischial expansile lytic bone lesion. Possible plasmacytoma vs  metastatic lesion vs benign lytic lesion. (crm)      FINAL MICROSCOPIC DIAGNOSIS:  A. BONE, RIGHT PELVIS, ILIAC/ISCHIAL LYTIC, BIOPSY:  - Metastatic adenocarcinoma consistent with lung primary.  - See comment.    RADIOGRAPHIC STUDIES: I have personally reviewed the radiological images as listed and agreed with the findings in the report. MR Brain W Wo Contrast  Result Date: 09/25/2021 CLINICAL DATA:  Brain/CNS neoplasm, staging. SRS treatment planning. Non-small cell lung cancer. EXAM: MRI HEAD WITHOUT AND WITH CONTRAST TECHNIQUE: Multiplanar, multiecho pulse sequences of the brain and surrounding structures were obtained without and with intravenous contrast. CONTRAST:  66m GADAVIST GADOBUTROL 1 MMOL/ML IV SOLN COMPARISON:  MR Head 09/13/2021. FINDINGS: Brain: There is no evidence of an acute infarct, intracranial hemorrhage, midline shift, or extra-axial fluid collection. Patchy T2 hyperintensities throughout the cerebral white matter bilaterally are unchanged and nonspecific but compatible with moderate to severe chronic small vessel ischemic disease. There is mild generalized cerebral atrophy. Three ring-enhancing lesions in the right frontal lobe are unchanged from the recent prior, measuring 9 mm (series 20, image 113), 10 mm (series 20, image 117), and 8 mm (series 20, image 133). There is no significant associated edema, and no new lesions are identified. A 9 mm homogeneously enhancing extra-axial mass in the right middle cranial fossa is unchanged (series 20, image 85). Vascular: Major intracranial vascular  flow voids are preserved. Skull and upper cervical spine: Unremarkable bone marrow signal. Sinuses/Orbits: Bilateral cataract extraction. Paranasal sinuses and mastoid air cells are clear. Other: None. IMPRESSION: 1. Three unchanged metastases in the right frontal lobe. No new lesions identified. 2. Unchanged 9 mm extra-axial mass in the right middle cranial fossa most consistent with a meningioma. 3. Moderate to severe chronic small vessel ischemic disease. Electronically Signed   By: ALogan BoresM.D.   On: 09/25/2021 10:03    ASSESSMENT & PLAN:   80year old male with history of Parkinson's disease, coronary artery disease, atrial fibrillation on anticoagulation with  1) Newly diagnosed metastatic lung adenocarcinoma with bone mets and brain metastases in the right frontal lobe EGFR mutation positive PD-L1 70% positive Neoplasm related pain is in his right posterior hip and upper thigh PET CT scan from 09/10/2021 showed Hypermetabolic solid nodule of the right lung apex measuring 12 mm compatible with primary lung malignancy.Hypermetabolic lytic lesions of the right ilium concerning for osseous metastatic disease.  Hypermetabolic focus which correlates with the right C2-C3 facet with no lytic lesion visualized, possibly due to degenerative disease.  MRI of the brain with and without contrast on 09/13/2021 which showed Three metastatic deposits in the brain in the right frontal lobe. Mild associated edema. 9 mm meningioma right middle cranial fossa. Atrophy and chronic microvascular ischemic  change.  2) L2 and L3 lesions concerning for bone metastases. 3) history of Parkinson's disease on Sinemet follows with Dr. Margette Fast 4) coronary artery disease status post PCI 2007 atrial fibrillation status post pacemaker placement on anticoagulation and digoxin follows with Dr. Sallyanne Kuster 5) hypertension 6) dyslipidemia PLAN -We discussed all available lab results.  CBC showed mild thrombocytopenia  which will need to be monitored. -Patient is status post SRS and is having some confusion per his wife. -Patient has been started on dexamethasone 4 mg p.o. twice daily for his brain metastasis related swelling by radiation oncology.  Recommended the doses be taken with breakfast and lunch and not later in the day to avoid insomnia. -Dexamethasone taper as per radiation oncology. -Digoxin levels were checked on 10/11/2021 and are at 0.6 and patient was started on Tagrisso. -His digoxin has been reduced to 4 times weekly and he has a set up for his pacemaker/defibrillator to be interrogated by cardiology to monitor his A. Fib. -Fentanyl patch not renewed at this time due to confusion he will continue his as needed Dilaudid if needed for pain. -We will see the patient back in about 2 weeks after starting [place] for repeat labs, repeat digoxin level and EKG -Continue Zometa every 4 weeks. -UA was done on 10/24 and showed no signs of urinary tract infection that could cause confusion Follow-up  RTC with Dr Irene Limbo with labs in 3 weeks   All of the patients questions were answered with apparent satisfaction. The patient knows to call the clinic with any problems, questions or concerns. . The total time spent in the appointment was 40 minutes and more than 50% was on counseling and direct patient cares.     Sullivan Lone MD MS AAHIVMS [facility] Hematology/Oncology Physician [facility]      .

## 2021-10-16 ENCOUNTER — Encounter: Payer: Self-pay | Admitting: Hematology

## 2021-10-16 NOTE — Progress Notes (Signed)
                                                                                                                                                             Patient Name: [facility] MRN: [id number] DOB: [birth year] Referring Physician: Sullivan Lone (Profile Not Attached) Date of Service: 10/04/2021 [facility] [place]                                                        End Of Treatment Note  Diagnoses: C34.11-Malignant neoplasm of upper lobe, right bronchus or lung C79.31-Secondary malignant neoplasm of brain C79.51-Secondary malignant neoplasm of bone  Cancer Staging: Stage IV, NSCLC, adenocarcinoma of the RUL with bone metastases.   Intent: Palliative  Radiation Treatment Dates: 09/22/2021 through 10/04/2021 Site Technique Total Dose (Gy) Dose per Fx (Gy) Completed Fx Beam Energies  10/01/21 SRS Brain: PTV_1_FrontRt_38mm PTV_2_FrontRt_25mm PTV_3_FrontRt_49mm  IMRT 20/20 20 1/1 6XFFF  Ilium, Right: Pelvis_Rt IMRT 40/40 8 5/5 6XFFF   Narrative: The patient tolerated radiation therapy relatively well. He did have fatigue and some persistent pain in the pelvic region.  Plan: The patient will receive a call in about one month from the radiation oncology department. He will continue follow up with Dr. Irene Limbo as well.   ________________________________________________    Carola Rhine, [facility]

## 2021-10-18 NOTE — Op Note (Signed)
Name: Marcus Fuller    MRN: [id number]   Date: 10/01/2021    DOB: 1941-08-06   STEREOTACTIC RADIOSURGERY OPERATIVE NOTE  PRE-OPERATIVE DIAGNOSIS:  Metastatic lung adenocarcinoma to the brain, multiple lesions  POST-OPERATIVE DIAGNOSIS:  Same  PROCEDURE:  Stereotactic Radiosurgery  SURGEON:  Elwin Sleight, DO  RADIATION ONCOLOGIST: Dr. Lisbeth Renshaw  TECHNIQUE:  The patient underwent a radiation treatment planning session in the radiation oncology simulation suite under the care of the radiation oncology physician and physicist.  I participated closely in the radiation treatment planning afterwards. The patient underwent planning CT which was fused to 3T high resolution MRI with 1 mm axial slices.  These images were fused on the planning system.  We contoured the gross target volumes and subsequently expanded this to yield the Planning Target Volume. I actively participated in the planning process.  I helped to define and review the target contours and also the contours of the optic pathway, eyes, brainstem and selected nearby organs at risk.  All the dose constraints for critical structures were reviewed and compared to AAPM Task Group 101.  The prescription dose conformity was reviewed.  I approved the plan electronically.    Accordingly, [facility]  was brought to the TrueBeam stereotactic radiation treatment linac and placed in the custom immobilization mask.  The patient was aligned according to the IR fiducial markers with BrainLab Exactrac, then orthogonal x-rays were used in ExacTrac with the 6DOF robotic table and the shifts were made to align the patient.  Marc[facility] received stereotactic radiosurgery to a prescription dose of 20Gy uneventfully, as one fraction to the three lesions.    The detailed description of the procedure is recorded in the radiation oncology procedure note.  I was present for the duration of the procedure.  DISPOSITION:   Following delivery, the patient was  transported to nursing in stable condition and monitored for possible acute effects to be discharged to home in stable condition with follow-up in one month.  Elwin Sleight, [facility]

## 2021-10-20 ENCOUNTER — Other Ambulatory Visit: Payer: Self-pay

## 2021-10-20 DIAGNOSIS — C3411 Malignant neoplasm of upper lobe, right bronchus or lung: Secondary | ICD-10-CM

## 2021-10-21 ENCOUNTER — Inpatient Hospital Stay (HOSPITAL_BASED_OUTPATIENT_CLINIC_OR_DEPARTMENT_OTHER): Payer: Medicare Other | Admitting: Nurse Practitioner

## 2021-10-21 ENCOUNTER — Other Ambulatory Visit: Payer: Self-pay

## 2021-10-21 ENCOUNTER — Inpatient Hospital Stay: Payer: Medicare Other | Attending: Hematology | Admitting: Hematology

## 2021-10-21 ENCOUNTER — Inpatient Hospital Stay: Payer: Medicare Other

## 2021-10-21 ENCOUNTER — Other Ambulatory Visit: Payer: Medicare Other

## 2021-10-21 VITALS — BP 89/70 | HR 54 | Temp 97.3°F | Resp 18

## 2021-10-21 DIAGNOSIS — R4189 Other symptoms and signs involving cognitive functions and awareness: Secondary | ICD-10-CM | POA: Insufficient documentation

## 2021-10-21 DIAGNOSIS — R29818 Other symptoms and signs involving the nervous system: Secondary | ICD-10-CM | POA: Insufficient documentation

## 2021-10-21 DIAGNOSIS — C7951 Secondary malignant neoplasm of bone: Secondary | ICD-10-CM

## 2021-10-21 DIAGNOSIS — R4701 Aphasia: Secondary | ICD-10-CM | POA: Diagnosis not present

## 2021-10-21 DIAGNOSIS — I2510 Atherosclerotic heart disease of native coronary artery without angina pectoris: Secondary | ICD-10-CM | POA: Insufficient documentation

## 2021-10-21 DIAGNOSIS — R531 Weakness: Secondary | ICD-10-CM | POA: Diagnosis not present

## 2021-10-21 DIAGNOSIS — G893 Neoplasm related pain (acute) (chronic): Secondary | ICD-10-CM | POA: Insufficient documentation

## 2021-10-21 DIAGNOSIS — Z515 Encounter for palliative care: Secondary | ICD-10-CM | POA: Diagnosis not present

## 2021-10-21 DIAGNOSIS — I6782 Cerebral ischemia: Secondary | ICD-10-CM | POA: Diagnosis not present

## 2021-10-21 DIAGNOSIS — C3411 Malignant neoplasm of upper lobe, right bronchus or lung: Secondary | ICD-10-CM

## 2021-10-21 DIAGNOSIS — D696 Thrombocytopenia, unspecified: Secondary | ICD-10-CM | POA: Insufficient documentation

## 2021-10-21 DIAGNOSIS — C7931 Secondary malignant neoplasm of brain: Secondary | ICD-10-CM | POA: Diagnosis not present

## 2021-10-21 DIAGNOSIS — G20 Parkinson's disease: Secondary | ICD-10-CM | POA: Diagnosis not present

## 2021-10-21 DIAGNOSIS — I10 Essential (primary) hypertension: Secondary | ICD-10-CM | POA: Diagnosis not present

## 2021-10-21 LAB — CMP (CANCER CENTER ONLY)
ALT: 21 U/L (ref 0–44)
AST: 23 U/L (ref 15–41)
Albumin: 3.1 g/dL — ABNORMAL LOW (ref 3.5–5.0)
Alkaline Phosphatase: 92 U/L (ref 38–126)
Anion gap: 8 (ref 5–15)
BUN: 25 mg/dL — ABNORMAL HIGH (ref 8–23)
CO2: 27 mmol/L (ref 22–32)
Calcium: 8.8 mg/dL — ABNORMAL LOW (ref 8.9–10.3)
Chloride: 105 mmol/L (ref 98–111)
Creatinine: 0.96 mg/dL (ref 0.61–1.24)
GFR, Estimated: >60 mL/min (ref >60)
Glucose, Bld: 92 mg/dL (ref 70–99)
Potassium: 4.4 mmol/L (ref 3.5–5.1)
Sodium: 140 mmol/L (ref 135–145)
Total Bilirubin: 1 mg/dL (ref 0.3–1.2)
Total Protein: 5.2 g/dL — ABNORMAL LOW (ref 6.5–8.1)

## 2021-10-21 LAB — CBC WITH DIFFERENTIAL (CANCER CENTER ONLY)
Abs Immature Granulocytes: 0.25 10*3/uL — ABNORMAL HIGH (ref 0.00–0.07)
Basophils Absolute: 0 10*3/uL (ref 0.0–0.1)
Basophils Relative: 0 %
Eosinophils Absolute: 0 10*3/uL (ref 0.0–0.5)
Eosinophils Relative: 0 %
HCT: 47.7 % (ref 39.0–52.0)
Hemoglobin: 15.2 g/dL (ref 13.0–17.0)
Immature Granulocytes: 5 %
Lymphocytes Relative: 9 %
Lymphs Abs: 0.4 10*3/uL — ABNORMAL LOW (ref 0.7–4.0)
MCH: 28.5 pg (ref 26.0–34.0)
MCHC: 31.9 g/dL (ref 30.0–36.0)
MCV: 89.3 fL (ref 80.0–100.0)
Monocytes Absolute: 0.3 10*3/uL (ref 0.1–1.0)
Monocytes Relative: 7 %
Neutro Abs: 3.6 10*3/uL (ref 1.7–7.7)
Neutrophils Relative %: 79 %
Platelet Count: 57 10*3/uL — ABNORMAL LOW (ref 150–400)
RBC: 5.34 MIL/uL (ref 4.22–5.81)
RDW: 14.9 % (ref 11.5–15.5)
WBC Count: 4.6 10*3/uL (ref 4.0–10.5)
nRBC: 0 % (ref 0.0–0.2)

## 2021-10-21 LAB — LACTATE DEHYDROGENASE: LDH: 232 U/L — ABNORMAL HIGH (ref 98–192)

## 2021-10-21 MED ORDER — NYSTATIN 100000 UNIT/ML MT SUSP: 5.0000 mL | Freq: Four times a day (QID) | OROMUCOSAL | 0 refills | Status: AC

## 2021-10-21 NOTE — Progress Notes (Signed)
[place]  Telephone:([phone]) [phone] [phone]) [phone]   Name: Marcus Fuller Date: 10/21/2021 MRN: [id number]  DOB: [birth year]  Patient Care Team: Cari Caraway, MD as PCP - General (Family Medicine) Sanda Klein, MD as PCP - Cardiology (Cardiology) Jola Baptist, [place] as Referring Physician (Chiropractic Medicine) Pickenpack-Cousar, Carlena Sax, NP as Nurse Practitioner (Nurse Practitioner)    Marcus Fuller is a 80 y.o. male with multiple medical problems including hypertension, Parkinson's disease, CAD s/p PCI (2007), pacemaker, chronic low back pain, and atrial fibrillation (Digoxin and Eliquis). Patient with newly diagnosed metastatic lung adenocarcinoma with frontal lobe brain mets and bone lesions. Palliative ask to see for symptom management and goals of care.   SOCIAL HISTORY:     reports that he has never smoked. He has never used smokeless tobacco. He reports that he does not currently use alcohol. He reports that he does not use drugs.  ADVANCE DIRECTIVES:  Patient does not have completed advanced directives.  Discussed at length with both he and his wife.  Wife confirms that have advanced directive packet and plans to review.  She will reach out if they have any further questions.  Advanced directive clinic dates provided and wife will let us know if she would like a referral to the clinic.  CODE STATUS:   PAST MEDICAL HISTORY: Past Medical History:  Diagnosis Date   Anxiety    Cataract    beginning stages   Chronic low back pain 07/03/2019   Coronary artery disease    stent- 2007    History of kidney stones    Hypertension    Parkinson's disease ([place]) 01/29/2018   Presence of permanent cardiac pacemaker    2017    Tremor of right hand     PAST SURGICAL HISTORY:  Past Surgical History:  Procedure Laterality Date   APPENDECTOMY     bbb     CARDIOVERSION N/A 04/19/2021   Procedure: CARDIOVERSION;  Surgeon:  Lelon Perla, MD;  [facility] END[facility]COPY;  Service: Cardiovascular;  Laterality: N/A;   CHOLECYSTECTOMY N/A 05/21/2018   Procedure: LAPAROSCOPIC CHOLECYSTECTOMY;  Surgeon: Coralie Keens, MD;  Location: [facility] ORS;  Service: General;  Laterality: N/A;   left knee arthroscopy   [place]  11/2016   STENT PLACEMENT VASCULAR ([place] HX)  2007   TONSILLECTOMY      HEMATOLOGY/ONCOLOGY HISTORY:  Oncology History  Primary adenocarcinoma of upper lobe of right lung ([place])  09/08/2021 Initial Diagnosis   Primary adenocarcinoma of upper lobe of right lung ([place])   09/22/2021 Cancer Staging   Staging form: Lung, AJCC 8th Edition - Clinical: Stage IVB (cT1b, cNX, cM1c) - Signed by Brunetta Genera, MD on 09/22/2021 Histologic grade (G): GX Histologic grading system: 4 grade system      ALLERGIES:  is allergic to atorvastatin and codeine.  MEDICATIONS:  Current Outpatient Medications  Medication Sig Dispense Refill   acetaminophen (TYLENOL) 325 MG tablet Take 650 mg by mouth every 6 (six) hours as needed.     carbidopa-levodopa (SINEMET) 25-250 MG tablet Take 1 tablet by mouth 3 (three) times daily. 270 tablet 3   dexamethasone (DECADRON) 4 MG tablet TAKE 1 TABLET BY MOUTH 2 TIMES DAILY. 60 tablet 0   digoxin (LANOXIN) 0.125 MG tablet Take 1 tablet (125 mcg total) by mouth 4 (four) times a week. Take Tuesday, Thursday, Saturday and Sunday 16 tablet 11   ELIQUIS 5 MG TABS tablet TAKE  1 TABLET BY MOUTH TWICE A DAY 60 tablet 5   gabapentin (NEURONTIN) 600 MG tablet 1/2 tablet in the morning and midday, 1 at night 180 tablet 3   HYDROmorphone (DILAUDID) 2 MG tablet Take 1 tablet (2 mg total) by mouth every 4 (four) hours as needed for severe pain. 30 tablet 0   Melatonin 10 MG TABS Take 10 mg by mouth at bedtime.     nystatin (MYCOSTATIN) 100000 UNIT/ML suspension Take 5 mLs (500,000 Units total) by mouth 4 (four) times daily for 10 days. 200 mL 0   osimertinib mesylate  (TAGRISSO) 80 MG tablet Take 1 tablet (80 mg total) by mouth daily. 30 tablet 1   polyethylene glycol (MIRALAX) 17 g packet Take 17 g by mouth daily. 30 each 0   senna-docusate (SENNA S) 8.6-50 MG tablet Take 2 tablets by mouth at bedtime. 60 tablet 1   No current facility-administered medications for this visit.    VITAL SIGNS: There were no vitals taken for this visit. There were no vitals filed for this visit.  Estimated body mass index is 26.96 kg/m as calculated from the following:   Height as of 10/08/21: 5\' 11"  (1.803 m).   Weight as of 09/16/21: 193 lb 4.8 oz (87.7 kg).  LABS: CBC:    Component Value Date/Time   WBC 4.6 10/21/2021 1414   WBC 9.4 08/19/2021 1302   HGB 15.2 10/21/2021 1414   HGB 15.5 12/28/2018 1245   HCT 47.7 10/21/2021 1414   HCT 46.3 12/28/2018 1245   PLT 57 (L) 10/21/2021 1414   PLT 165 12/28/2018 1245   MCV 89.3 10/21/2021 1414   MCV 84 12/28/2018 1245   NEUTROABS 3.6 10/21/2021 1414   NEUTROABS 6.3 12/28/2018 1245   LYMPHSABS 0.4 (L) 10/21/2021 1414   LYMPHSABS 2.1 12/28/2018 1245   MONOABS 0.3 10/21/2021 1414   EOSABS 0.0 10/21/2021 1414   EOSABS 0.2 12/28/2018 1245   BASOSABS 0.0 10/21/2021 1414   BASOSABS 0.1 12/28/2018 1245   Comprehensive Metabolic Panel:    Component Value Date/Time   NA 140 10/21/2021 1414   NA 141 07/06/2020 0949   K 4.4 10/21/2021 1414   CL 105 10/21/2021 1414   CO2 27 10/21/2021 1414   BUN 25 (H) 10/21/2021 1414   BUN 16 07/06/2020 0949   CREATININE 0.96 10/21/2021 1414   GLUCOSE 92 10/21/2021 1414   CALCIUM 8.8 (L) 10/21/2021 1414   AST 23 10/21/2021 1414   ALT 21 10/21/2021 1414   ALKPHOS 92 10/21/2021 1414   BILITOT 1.0 10/21/2021 1414   PROT 5.2 (L) 10/21/2021 1414   PROT 6.6 12/28/2018 1245   ALBUMIN 3.1 (L) 10/21/2021 1414   ALBUMIN 4.4 12/28/2018 1245    RADIOGRAPHIC STUDIES: MR Brain W Wo Contrast  Result Date: 09/25/2021 CLINICAL DATA:  Brain/CNS neoplasm, staging. SRS treatment planning.  Non-small cell lung cancer. EXAM: MRI HEAD WITHOUT AND WITH CONTRAST TECHNIQUE: Multiplanar, multiecho pulse sequences of the brain and surrounding structures were obtained without and with intravenous contrast. CONTRAST:  37mL GADAVIST GADOBUTROL 1 MMOL/ML IV SOLN COMPARISON:  MR Head 09/13/2021. FINDINGS: Brain: There is no evidence of an acute infarct, intracranial hemorrhage, midline shift, or extra-axial fluid collection. Patchy T2 hyperintensities throughout the cerebral white matter bilaterally are unchanged and nonspecific but compatible with moderate to severe chronic small vessel ischemic disease. There is mild generalized cerebral atrophy. Three ring-enhancing lesions in the right frontal lobe are unchanged from the recent prior, measuring 9 mm (series 20,  image 113), 10 mm (series 20, image 117), and 8 mm (series 20, image 133). There is no significant associated edema, and no new lesions are identified. A 9 mm homogeneously enhancing extra-axial mass in the right middle cranial fossa is unchanged (series 20, image 85). Vascular: Major intracranial vascular flow voids are preserved. Skull and upper cervical spine: Unremarkable bone marrow signal. Sinuses/Orbits: Bilateral cataract extraction. Paranasal sinuses and mastoid air cells are clear. Other: None. IMPRESSION: 1. Three unchanged metastases in the right frontal lobe. No new lesions identified. 2. Unchanged 9 mm extra-axial mass in the right middle cranial fossa most consistent with a meningioma. 3. Moderate to severe chronic small vessel ischemic disease. Electronically Signed   By: Logan Bores M.D.   On: 09/25/2021 10:03    PERFORMANCE STATUS (ECOG) : 3 - Symptomatic, >50% confined to bed  Review of Systems  Neurological:  Positive for weakness.  Psychiatric/Behavioral:  Positive for confusion.   Unless otherwise noted, a complete review of systems is negative.  Physical Exam General: NAD, sitting in wheelchair Cardiovascular: regular  rate and rhythm Pulmonary: clear ant fields Abdomen: soft, nontender, + bowel sounds Skin: no rashes, dry  Neurological: Weakness, confusion noted today, alert to self and family   IMPRESSION:  Marcus Fuller presents to the clinic today with his wife and daughter. He was seen for follow-up by Dr. Irene Limbo. Denies shortness of breath. Is confused today compared to our last interaction. Mrs. Karl reports patient has been experiencing intermittent confusion.  Some days are better than others however patient is still functional.  Patient and wife endorses continued pain however no significant difference with the use of pain medication versus not.  In light of patient's confusion we will plan to minimize use of opioids and utilize Tylenol as needed.  Family denies any concerns with nausea.  Reports patient's appetite remains good.  Often eats 4 meals and snacks in between.  Patient currently asking for ice cream which he loves.  Family is concerned about patient's continued weakness and gait instability. He is requiring significant assistance ambulating and getting in and out of the car. He has not had any recent falls however has fallen in the past 6-8 weeks. Wife is requesting support at home from therapy if available to allow patient an opportunity to improve and hopefully regain some strength.   PLAN: Will plan to minimize use of hydromorphone in the setting of confusion and decreased evidence of pain. Will use Tylenol as needed.  Order sent to [facility] for 3-n-1 bedside commode to assist with safety during toileting and showering.  Plans to initiate home health PT/OT for generalized weakness and deconditioning as they discussed with Dr. Irene Limbo.  Encouraged continued conversations with family and medical providers regarding overall plan of care and treatment options, ensuring decisions are within the context of the patients values and GOCs. I will plan to see him back in collaboration with  future oncology/treatment visits.    Patient and family expressed understanding and was in agreement with this plan. Wife also understands that she can call the clinic at any time with any questions, concerns, or complaints.     Time Total: 25 min.    Visit consisted of counseling and education dealing with the complex and emotionally intense issues of symptom management and palliative care in the setting of serious and potentially life-threatening illness.Greater than 50%  of this time was spent counseling and coordinating care related to the above assessment and plan.  Signed  by: Alda Lea, AGPCNP-BC Palliative Medicine Team

## 2021-10-22 ENCOUNTER — Telehealth: Payer: Self-pay | Admitting: Hematology

## 2021-10-22 DIAGNOSIS — G20 Parkinson's disease: Secondary | ICD-10-CM | POA: Diagnosis not present

## 2021-10-22 LAB — KAPPA/LAMBDA LIGHT CHAINS
Kappa free light chain: 15.9 mg/L (ref 3.3–19.4)
Kappa, lambda light chain ratio: 1.92 — ABNORMAL HIGH (ref 0.26–1.65)
Lambda free light chains: 8.3 mg/L (ref 5.7–26.3)

## 2021-10-22 NOTE — Telephone Encounter (Signed)
Scheduled per 11/3 los, pts wife has been called and confirmed appt

## 2021-10-25 LAB — MULTIPLE MYELOMA PANEL, SERUM
Albumin SerPl Elph-Mcnc: 3 g/dL (ref 2.9–4.4)
Albumin/Glob SerPl: 1.6 (ref 0.7–1.7)
Alpha 1: 0.1 g/dL (ref 0.0–0.4)
Alpha2 Glob SerPl Elph-Mcnc: 0.6 g/dL (ref 0.4–1.0)
B-Globulin SerPl Elph-Mcnc: 0.8 g/dL (ref 0.7–1.3)
Gamma Glob SerPl Elph-Mcnc: 0.4 g/dL (ref 0.4–1.8)
Globulin, Total: 2 g/dL — ABNORMAL LOW (ref 2.2–3.9)
IgA: 67 mg/dL (ref 61–437)
IgG (Immunoglobin G), Serum: 493 mg/dL — ABNORMAL LOW (ref 603–1613)
IgM (Immunoglobulin M), Srm: 35 mg/dL (ref 15–143)
Total Protein ELP: 5 g/dL — ABNORMAL LOW (ref 6.0–8.5)

## 2021-10-27 ENCOUNTER — Encounter: Payer: Self-pay | Admitting: Hematology

## 2021-10-27 ENCOUNTER — Other Ambulatory Visit: Payer: Self-pay | Admitting: Cardiovascular Disease

## 2021-10-27 NOTE — Progress Notes (Addendum)
[facility]   HEMATOLOGY/ONCOLOGY CLINIC NOTE  Date of Service: .10/21/2021   Patient Care Team: Cari Caraway, MD as PCP - General (Family Medicine) Croitoru, Dani Gobble, MD as PCP - Cardiology (Cardiology) Jola Baptist, [place] as Referring Physician (Chiropractic Medicine) Pickenpack-Cousar, Carlena Sax, NP as Nurse Practitioner (Nurse Practitioner)  CHIEF COMPLAINTS/PURPOSE OF CONSULTATION:  Newly diagnosed metastatic lung adenocarcinoma with bone mets  HISTORY OF PRESENTING ILLNESS:   Marcus Fuller is a wonderful 80 y.o. male who has been referred to [name] by Dr Eunice Blase for evaluation and management of newly noted pelvic mass/bone metastases.  Patient has a history of hypertension, Parkinson's disease [follows with Dr. Juanda Crumble [place]], coronary artery disease status post PCI in 2007, history of kidney stones, presence of permanent cardiac pacemaker who has history of chronic low back pain. Patient notes that over the last couple of months he has had increasing low back pain had an MRI of the lumbar spine without contrast ordered by Dr. Junius Roads on 08/03/2021.  MRI lumbar spine showed  1. Expansile lesion in the right iliac bone is concerning for malignancy, particularly metastatic disease; correlate with any history of malignancy. If there is no history of malignancy, oncologic workup and dedicated MRI of the hip with and without contrast are recommended for further evaluation. 2. Two small foci of signal abnormality in the L2 and L3 vertebral bodies are suspicious for additional small metastases. MRI of the lumbar spine with and without contrast may be helpful for better characterization. 3. Mild multilevel degenerative changes of the lumbar spine detailed above resulting in up to moderate right neural foraminal stenosis at L4-L5. Otherwise, no high-grade spinal canal or neural foraminal stenosis. 4. Trace free fluid in the abdomen, nonspecific.  Patient notes some pain on the right  posterior hip and upper thigh.  No pain radiating into his lower extremities.  No lower extremity weakness that is new.  No loss of bowel or bladder control.  Patient does note chronic weakness decreased mobility due to has Parkinson's disease.  He is on Sinemet for this.  He notes that at baseline he uses a rolling walker but has had difficulty lifting his right leg due to pain.  He has had chronic low back pain in the past and has been seen by [facility] Herma Mering at [facility] and has previously received 2 injections in his spine for pain management.  He also has a history of chronic atrial fibrillation anticoagulation and digoxin.  Patient notes no significant weight loss in the last 3 months. . Wt Readings from Last 3 Encounters:  09/16/21 193 lb 4.8 oz (87.7 kg)  09/08/21 195 lb (88.5 kg)  08/19/21 197 lb 8 oz (89.6 kg)   No other focal neurological deficits no headaches. No shortness of breath or new chest pain. No change in urinary or bowel habits. No previous history of any cancers.   INTERVAL HISTORY  Marcus Fuller is here with his wife for continued management of his recently diagnosed metastatic lung adenocarcinoma. He has been tolerating Tagrisso without any acute issues. EKG today shows normal QT interval. Some intermittent confusion and unrelated comments likely related to his frontal brain metastasis. He is on tapering dose of dexamethasone per radiation oncology.  No fevers no chills no night sweats. No shortness of breath. He has not reported much pain and is not taking much of his pain medication at this time. He is trying to eat better.  No other acute new symptoms.  Being seen by palliative care team.  MEDICAL HISTORY:  Past Medical History:  Diagnosis Date   Anxiety    Cataract    beginning stages   Chronic low back pain 07/03/2019   Coronary artery disease    stent- 2007    History of kidney stones    Hypertension    Parkinson's disease ([place]) 01/29/2018    Presence of permanent cardiac pacemaker    2017    Tremor of right hand     SURGICAL HISTORY: Past Surgical History:  Procedure Laterality Date   APPENDECTOMY     bbb     CARDIOVERSION N/A 04/19/2021   Procedure: CARDIOVERSION;  Surgeon: Lelon Perla, MD;  Location: [place] ENDOSCOPY;  Service: Cardiovascular;  Laterality: N/A;   CHOLECYSTECTOMY N/A 05/21/2018   Procedure: LAPAROSCOPIC CHOLECYSTECTOMY;  Surgeon: Coralie Keens, MD;  Location: [facility] ORS;  Service: General;  Laterality: N/A;   left knee arthroscopy   1996   PACEMAKER INSERTION  11/2016   STENT PLACEMENT VASCULAR ([place] HX)  2007   TONSILLECTOMY      SOCIAL HISTORY: Social History   Socioeconomic History   Marital status: Married    Spouse name: Cecelia   Number of children: 2   Years of education: 14   Highest education level: Not on file  Occupational History   Not on file  Tobacco Use   Smoking status: Never   Smokeless tobacco: Never  Vaping Use   Vaping Use: Never used  Substance and Sexual Activity   Alcohol use: Not Currently    Comment: social    Drug use: No   Sexual activity: Not Currently  Other Topics Concern   Not on file  Social History Narrative   Lives w/ wife   Caffeine use: sometimes   Right handed    Social Determinants of Health   Financial Resource Strain: Not on file  Food Insecurity: Not on file  Transportation Needs: Not on file  Physical Activity: Not on file  Stress: Not on file  Social Connections: Not on file  Intimate Partner Violence: Not on file    FAMILY HISTORY: Family History  Problem Relation Age of Onset   Heart disease Mother    Diabetes Mother    Cancer Mother        breast   Heart disease Father    Heart attack Father    Atrial fibrillation Sister    Heart disease Brother    Pancreatic disease Brother    Tremor Brother    Heart attack Paternal Grandfather    Spina bifida Brother     ALLERGIES:  is allergic to atorvastatin and  codeine.  MEDICATIONS:  Current Outpatient Medications  Medication Sig Dispense Refill   nystatin (MYCOSTATIN) 100000 UNIT/ML suspension Take 5 mLs (500,000 Units total) by mouth 4 (four) times daily for 10 days. 200 mL 0   acetaminophen (TYLENOL) 325 MG tablet Take 650 mg by mouth every 6 (six) hours as needed.     carbidopa-levodopa (SINEMET) 25-250 MG tablet Take 1 tablet by mouth 3 (three) times daily. 270 tablet 3   dexamethasone (DECADRON) 4 MG tablet TAKE 1 TABLET BY MOUTH 2 TIMES DAILY. 60 tablet 0   digoxin (LANOXIN) 0.125 MG tablet Take 1 tablet (125 mcg total) by mouth 4 (four) times a week. Take Tuesday, Thursday, Saturday and Sunday 16 tablet 11   ELIQUIS 5 MG TABS tablet TAKE 1 TABLET BY MOUTH TWICE A DAY 60 tablet 5   gabapentin (NEURONTIN) 600 MG tablet 1/2 tablet in  the morning and midday, 1 at night 180 tablet 3   HYDROmorphone (DILAUDID) 2 MG tablet Take 1 tablet (2 mg total) by mouth every 4 (four) hours as needed for severe pain. 30 tablet 0   Melatonin 10 MG TABS Take 10 mg by mouth at bedtime.     osimertinib mesylate (TAGRISSO) 80 MG tablet Take 1 tablet (80 mg total) by mouth daily. 30 tablet 1   polyethylene glycol (MIRALAX) 17 g packet Take 17 g by mouth daily. 30 each 0   senna-docusate (SENNA S) 8.6-50 MG tablet Take 2 tablets by mouth at bedtime. 60 tablet 1   No current facility-administered medications for this visit.    REVIEW OF SYSTEMS:    .10 Point review of Systems was done is negative except as noted above.    PHYSICAL EXAMINATION: ECOG PERFORMANCE STATUS: 3 - Symptomatic, >50% confined to bed  . Vitals:   10/21/21 1438  BP: (!) 89/70  Pulse: (!) 54  Resp: 18  Temp: (!) 97.3 F (36.3 C)  SpO2: 98%   There were no vitals filed for this visit.  .There is no height or weight on file to calculate BMI. [facility] Kitchen GENERAL:alert, in no acute distress and comfortable SKIN: no acute rashes, no significant lesions EYES: conjunctiva are pink and  non-injected, sclera anicteric OROPHARYNX: MMM, no exudates, no oropharyngeal erythema or ulceration NECK: supple, no JVD LYMPH:  no palpable lymphadenopathy in the cervical, axillary or inguinal regions LUNGS: clear to auscultation b/l with normal respiratory effort HEART: regular rate & rhythm ABDOMEN:  normoactive bowel sounds , non tender, not distended. Extremity: no pedal edema PSYCH: alert & oriented x 3 with fluent speech NEURO: no focal motor/sensory deficits   LABORATORY DATA:  I have reviewed the data as listed  . CBC Latest Ref Rng & Units 10/21/2021 10/08/2021 09/13/2021  WBC 4.0 - 10.5 K/uL 4.6 7.8 9.6  Hemoglobin 13.0 - 17.0 g/dL 15.2 16.0 15.8  Hematocrit 39.0 - 52.0 % 47.7 49.5 49.8  Platelets 150 - 400 K/uL 57(L) 84(L) 188    . CMP Latest Ref Rng & Units 10/21/2021 10/08/2021 09/13/2021  Glucose 70 - 99 mg/dL 92 113(H) 112(H)  BUN 8 - 23 mg/dL 25(H) 27(H) 17  Creatinine 0.61 - 1.24 mg/dL 0.96 0.81 0.84  Sodium 135 - 145 mmol/L 140 138 140  Potassium 3.5 - 5.1 mmol/L 4.4 4.8 4.1  Chloride 98 - 111 mmol/L 105 107 105  CO2 22 - 32 mmol/L '27 25 24  ' Calcium 8.9 - 10.3 mg/dL 8.8(L) 8.6(L) 10.5(H)  Total Protein 6.5 - 8.1 g/dL 5.2(L) 5.2(L) 6.7  Total Bilirubin 0.3 - 1.2 mg/dL 1.0 1.3(H) 1.2  Alkaline Phos 38 - 126 U/L 92 91 306(H)  AST 15 - 41 U/L '23 29 29  ' ALT 0 - 44 U/L '21 21 8   ' Component     Latest Ref Rng & Units 10/11/2021  Color, Urine     YELLOW YELLOW  Appearance     CLEAR CLEAR  Specific Gravity, Urine     1.005 - 1.030 1.009  pH     5.0 - 8.0 6.0  Glucose, UA     NEGATIVE mg/dL NEGATIVE  Hgb urine dipstick     NEGATIVE NEGATIVE  Bilirubin Urine     NEGATIVE NEGATIVE  Ketones, ur     NEGATIVE mg/dL NEGATIVE  Protein     NEGATIVE mg/dL NEGATIVE  Nitrite     NEGATIVE NEGATIVE  Leukocytes,Ua  NEGATIVE NEGATIVE  RBC / HPF     0 - 5 RBC/hpf 0-5  WBC, UA     0 - 5 WBC/hpf 0-5  Bacteria, UA     NONE SEEN NONE SEEN  Squamous Epithelial  / LPF     0 - 5 0-5  Mucus      PRESENT  Digoxin, Serum     0.8 - 2.0 ng/mL 0.6 (L)    SURGICAL PATHOLOGY  CASE: [id number]  PATIENT: Madaline Guthrie  Surgical Pathology Report      Clinical History: Painful right pelvic bone lesion at juncture of iliac  bone and ischium. Elevated free kappa light chains. 5 cm right  iliac/ischial expansile lytic bone lesion. Possible plasmacytoma vs  metastatic lesion vs benign lytic lesion. (crm)      FINAL MICROSCOPIC DIAGNOSIS:  A. BONE, RIGHT PELVIS, ILIAC/ISCHIAL LYTIC, BIOPSY:  - Metastatic adenocarcinoma consistent with lung primary.  - See comment.    RADIOGRAPHIC STUDIES: I have personally reviewed the radiological images as listed and agreed with the findings in the report. No results found.  ASSESSMENT & PLAN:   80 year old male with history of Parkinson's disease, coronary artery disease, atrial fibrillation on anticoagulation with  1) Newly diagnosed metastatic lung adenocarcinoma with bone mets and brain metastases in the right frontal lobe EGFR mutation positive PD-L1 70% positive Neoplasm related pain is in his right posterior hip and upper thigh PET CT scan from 09/10/2021 showed Hypermetabolic solid nodule of the right lung apex measuring 12 mm compatible with primary lung malignancy.Hypermetabolic lytic lesions of the right ilium concerning for osseous metastatic disease.  Hypermetabolic focus which correlates with the right C2-C3 facet with no lytic lesion visualized, possibly due to degenerative disease.  MRI of the brain with and without contrast on 09/13/2021 which showed Three metastatic deposits in the brain in the right frontal lobe. Mild associated edema. 9 mm meningioma right middle cranial fossa. Atrophy and chronic microvascular ischemic change.  2) L2 and L3 lesions concerning for bone metastases. 3) history of Parkinson's disease on Sinemet follows with Dr. Margette Fast 4) coronary artery disease  status post PCI 2007 atrial fibrillation status post pacemaker placement on anticoagulation and digoxin follows with Dr. Sallyanne Kuster 5) hypertension 6) dyslipidemia PLAN -We discussed all available lab results.  CBC showed mild thrombocytopenia plt 57k which will need to be monitored. -Patient is status post SRS and is having some confusion per his wife. -will refer to Dr Mickeal Skinner for mx of Parkinson's and mx of brain mets related neurodeficits/steroids. -Dexamethasone taper as per radiation oncology. -continue Tagrisso - no prohibitive toxicities at this time . Qtc WNL. -Continue Zometa every 4 weeks. Follow-up  -Referral to Dr Alroy Dust Vaslow/Neuro-oncology for mx of brain metastases and parkinson's -EKG today -Referral to home care for PT and OT. -RTC with Dr Irene Limbo in 3 weeks with labs   All of the patients questions were answered with apparent satisfaction. The patient knows to call the clinic with any problems, questions or concerns. . The total time spent in the appointment was 30 minutes and more than 50% was on counseling and direct patient cares.      Sullivan Lone MD MS AAHIVMS [facility] Hematology/Oncology Physician [facility]      .Marland Kitchen

## 2021-10-27 NOTE — Telephone Encounter (Signed)
Prescription refill request for Eliquis received. Indication:Afib Last office visit:8/22 Scr:0.9 Age: 80 Weight:87.7 kg  Prescription refilled

## 2021-10-28 ENCOUNTER — Telehealth: Payer: Self-pay | Admitting: Internal Medicine

## 2021-10-28 NOTE — Addendum Note (Signed)
Addended by: Sullivan Lone on: 10/28/2021 05:11 AM   Modules accepted: Orders

## 2021-10-28 NOTE — Telephone Encounter (Signed)
Scheduled appt per 11/10 referral. Pt is aware of appt date and time.

## 2021-11-02 ENCOUNTER — Telehealth: Payer: Self-pay

## 2021-11-02 NOTE — Telephone Encounter (Signed)
Mr. Gentles wife called me this morning, asking about the PT/HH referral that was placed. She stated she hasn't heard anything from [facility]. I reached out to [facility], who stated they didn't see the referral and that it would need to be faxed over. I informed Beth, RN of this and she stated she would fax that over to them. All questions answered. Understanding verbalized.

## 2021-11-04 DIAGNOSIS — G893 Neoplasm related pain (acute) (chronic): Secondary | ICD-10-CM | POA: Diagnosis not present

## 2021-11-04 DIAGNOSIS — C7931 Secondary malignant neoplasm of brain: Secondary | ICD-10-CM | POA: Diagnosis not present

## 2021-11-04 DIAGNOSIS — H3530 Unspecified macular degeneration: Secondary | ICD-10-CM | POA: Diagnosis not present

## 2021-11-04 DIAGNOSIS — C7951 Secondary malignant neoplasm of bone: Secondary | ICD-10-CM | POA: Diagnosis not present

## 2021-11-04 DIAGNOSIS — Z79891 Long term (current) use of opiate analgesic: Secondary | ICD-10-CM | POA: Diagnosis not present

## 2021-11-04 DIAGNOSIS — Z79899 Other long term (current) drug therapy: Secondary | ICD-10-CM | POA: Diagnosis not present

## 2021-11-04 DIAGNOSIS — I2510 Atherosclerotic heart disease of native coronary artery without angina pectoris: Secondary | ICD-10-CM | POA: Diagnosis not present

## 2021-11-04 DIAGNOSIS — M5136 Other intervertebral disc degeneration, lumbar region: Secondary | ICD-10-CM | POA: Diagnosis not present

## 2021-11-04 DIAGNOSIS — I10 Essential (primary) hypertension: Secondary | ICD-10-CM | POA: Diagnosis not present

## 2021-11-04 DIAGNOSIS — Z993 Dependence on wheelchair: Secondary | ICD-10-CM | POA: Diagnosis not present

## 2021-11-04 DIAGNOSIS — Z7901 Long term (current) use of anticoagulants: Secondary | ICD-10-CM | POA: Diagnosis not present

## 2021-11-04 DIAGNOSIS — M48061 Spinal stenosis, lumbar region without neurogenic claudication: Secondary | ICD-10-CM | POA: Diagnosis not present

## 2021-11-04 DIAGNOSIS — I482 Chronic atrial fibrillation, unspecified: Secondary | ICD-10-CM | POA: Diagnosis not present

## 2021-11-04 DIAGNOSIS — C3411 Malignant neoplasm of upper lobe, right bronchus or lung: Secondary | ICD-10-CM | POA: Diagnosis not present

## 2021-11-04 DIAGNOSIS — E785 Hyperlipidemia, unspecified: Secondary | ICD-10-CM | POA: Diagnosis not present

## 2021-11-04 DIAGNOSIS — G20 Parkinson's disease: Secondary | ICD-10-CM | POA: Diagnosis not present

## 2021-11-04 DIAGNOSIS — Z9181 History of falling: Secondary | ICD-10-CM | POA: Diagnosis not present

## 2021-11-06 ENCOUNTER — Ambulatory Visit: Payer: Medicare Other

## 2021-11-08 ENCOUNTER — Inpatient Hospital Stay: Payer: Medicare Other

## 2021-11-08 ENCOUNTER — Ambulatory Visit
Admission: RE | Admit: 2021-11-08 | Discharge: 2021-11-08 | Disposition: A | Discharge status: Home / Self Care | Payer: Medicare Other | Source: Ambulatory Visit | Attending: Radiation Oncology | Admitting: Radiation Oncology

## 2021-11-08 ENCOUNTER — Inpatient Hospital Stay: Payer: Medicare Other | Admitting: Nurse Practitioner

## 2021-11-08 DIAGNOSIS — M5136 Other intervertebral disc degeneration, lumbar region: Secondary | ICD-10-CM | POA: Diagnosis not present

## 2021-11-08 DIAGNOSIS — Z993 Dependence on wheelchair: Secondary | ICD-10-CM | POA: Diagnosis not present

## 2021-11-08 DIAGNOSIS — Z9181 History of falling: Secondary | ICD-10-CM | POA: Diagnosis not present

## 2021-11-08 DIAGNOSIS — I10 Essential (primary) hypertension: Secondary | ICD-10-CM | POA: Diagnosis not present

## 2021-11-08 DIAGNOSIS — Z79899 Other long term (current) drug therapy: Secondary | ICD-10-CM | POA: Diagnosis not present

## 2021-11-08 DIAGNOSIS — C7951 Secondary malignant neoplasm of bone: Secondary | ICD-10-CM | POA: Diagnosis not present

## 2021-11-08 DIAGNOSIS — G893 Neoplasm related pain (acute) (chronic): Secondary | ICD-10-CM | POA: Diagnosis not present

## 2021-11-08 DIAGNOSIS — C7931 Secondary malignant neoplasm of brain: Secondary | ICD-10-CM | POA: Diagnosis not present

## 2021-11-08 DIAGNOSIS — Z7901 Long term (current) use of anticoagulants: Secondary | ICD-10-CM | POA: Diagnosis not present

## 2021-11-08 DIAGNOSIS — H3530 Unspecified macular degeneration: Secondary | ICD-10-CM | POA: Diagnosis not present

## 2021-11-08 DIAGNOSIS — I482 Chronic atrial fibrillation, unspecified: Secondary | ICD-10-CM | POA: Diagnosis not present

## 2021-11-08 DIAGNOSIS — G20 Parkinson's disease: Secondary | ICD-10-CM | POA: Diagnosis not present

## 2021-11-08 DIAGNOSIS — M48061 Spinal stenosis, lumbar region without neurogenic claudication: Secondary | ICD-10-CM | POA: Diagnosis not present

## 2021-11-08 DIAGNOSIS — I2510 Atherosclerotic heart disease of native coronary artery without angina pectoris: Secondary | ICD-10-CM | POA: Diagnosis not present

## 2021-11-08 DIAGNOSIS — C3411 Malignant neoplasm of upper lobe, right bronchus or lung: Secondary | ICD-10-CM | POA: Insufficient documentation

## 2021-11-08 DIAGNOSIS — Z79891 Long term (current) use of opiate analgesic: Secondary | ICD-10-CM | POA: Diagnosis not present

## 2021-11-08 DIAGNOSIS — E785 Hyperlipidemia, unspecified: Secondary | ICD-10-CM | POA: Diagnosis not present

## 2021-11-09 ENCOUNTER — Inpatient Hospital Stay (HOSPITAL_BASED_OUTPATIENT_CLINIC_OR_DEPARTMENT_OTHER): Payer: Medicare Other | Admitting: Nurse Practitioner

## 2021-11-09 ENCOUNTER — Inpatient Hospital Stay: Payer: Medicare Other | Admitting: Internal Medicine

## 2021-11-09 ENCOUNTER — Other Ambulatory Visit: Payer: Self-pay

## 2021-11-09 VITALS — BP 99/80 | HR 88 | Temp 97.7°F | Resp 18 | Ht 71.0 in

## 2021-11-09 DIAGNOSIS — Z515 Encounter for palliative care: Secondary | ICD-10-CM | POA: Diagnosis not present

## 2021-11-09 DIAGNOSIS — C3411 Malignant neoplasm of upper lobe, right bronchus or lung: Secondary | ICD-10-CM

## 2021-11-09 DIAGNOSIS — I2510 Atherosclerotic heart disease of native coronary artery without angina pectoris: Secondary | ICD-10-CM | POA: Diagnosis not present

## 2021-11-09 DIAGNOSIS — G893 Neoplasm related pain (acute) (chronic): Secondary | ICD-10-CM

## 2021-11-09 DIAGNOSIS — R531 Weakness: Secondary | ICD-10-CM

## 2021-11-09 DIAGNOSIS — C7931 Secondary malignant neoplasm of brain: Secondary | ICD-10-CM | POA: Insufficient documentation

## 2021-11-09 DIAGNOSIS — C7951 Secondary malignant neoplasm of bone: Secondary | ICD-10-CM | POA: Diagnosis not present

## 2021-11-09 DIAGNOSIS — R4701 Aphasia: Secondary | ICD-10-CM | POA: Diagnosis not present

## 2021-11-09 DIAGNOSIS — I10 Essential (primary) hypertension: Secondary | ICD-10-CM | POA: Diagnosis not present

## 2021-11-09 DIAGNOSIS — G20 Parkinson's disease: Secondary | ICD-10-CM | POA: Diagnosis not present

## 2021-11-09 DIAGNOSIS — R569 Unspecified convulsions: Secondary | ICD-10-CM | POA: Diagnosis not present

## 2021-11-09 DIAGNOSIS — Z7189 Other specified counseling: Secondary | ICD-10-CM

## 2021-11-09 DIAGNOSIS — I6782 Cerebral ischemia: Secondary | ICD-10-CM | POA: Diagnosis not present

## 2021-11-09 DIAGNOSIS — D696 Thrombocytopenia, unspecified: Secondary | ICD-10-CM | POA: Diagnosis not present

## 2021-11-09 DIAGNOSIS — R29818 Other symptoms and signs involving the nervous system: Secondary | ICD-10-CM | POA: Diagnosis not present

## 2021-11-09 MED ORDER — TRIAMCINOLONE ACETONIDE 0.1 % EX OINT: TOPICAL_OINTMENT | Freq: Two times a day (BID) | CUTANEOUS | 2 refills | Status: DC

## 2021-11-09 MED ORDER — LEVETIRACETAM 250 MG PO TABS: 250.0000 mg | ORAL_TABLET | Freq: Two times a day (BID) | ORAL | 3 refills | Status: DC

## 2021-11-09 NOTE — Progress Notes (Signed)
  Radiation Oncology         [phone] ________________________________  Name: Marcus Fuller MRN: [id number]  Date of Service: 11/08/2021  DOB: 1941-09-13  Post Treatment Telephone Note  Diagnosis:   Stage IV, NSCLC, adenocarcinoma of the RUL with bone metastases.   Interval Since Last Radiation:  5 weeks   09/22/2021 through 10/04/2021 SRS and SBRT Style Therapies Site Technique Total Dose (Gy) Dose per Fx (Gy) Completed Fx Beam Energies  10/01/21 SRS Brain: PTV_1_FrontRt_28mm PTV_2_FrontRt_63mm PTV_3_FrontRt_8mm  IMRT 20/20 20 1/1 6XFFF  Ilium, Right: Pelvis_Rt IMRT 40/40 8 5/5 6XFFF   Narrative:  The patient was contacted today for routine follow-up. During treatment he did very well with radiotherapy and did not have significant desquamation. He reports he has had some trouble swallowing and will work with OT. He saw Dr. Mickeal Skinner and was diagnosed with possible focal seizure. He will start antiseizure medication as well.   Impression/Plan: 1. Stage IV, NSCLC, adenocarcinoma of the RUL with bone metastases. The patient has been doing well since completion of radiotherapy. We discussed that we would be happy to continue to follow him as needed, but he will also continue to follow up with Dr. Mickeal Skinner and Irene Limbo in medical oncology.      Carola Rhine, PAC

## 2021-11-09 NOTE — Progress Notes (Signed)
[facility] [place], [place] [address] [phone]   New Patient Evaluation  Date of Service: 11/09/21 Patient Name: Marcus Fuller Patient MRN: [id number] Patient DOB: December 10, 1941 Provider: Ventura Sellers, MD  Identifying Statement:  Marcus Fuller is a 80 y.o. male with Parkinson's disease ([place])  Brain metastases ([place])  Focal seizure ([place]) who presents for initial consultation and evaluation regarding cancer associated neurologic deficits.    Referring Provider: Cari Caraway, MD [place],  [place] [address]  Primary Cancer:  Oncologic History: Oncology History  Primary adenocarcinoma of upper lobe of right lung ([place])  09/08/2021 Initial Diagnosis   Primary adenocarcinoma of upper lobe of right lung ([place])   09/22/2021 Cancer Staging   Staging form: Lung, AJCC 8th Edition - Clinical: Stage IVB (cT1b, cNX, cM1c) - Signed by Brunetta Genera, MD on 09/22/2021 Histologic grade (G): GX Histologic grading system: 4 grade system     CNS Oncologic History 10/01/21: SRS to 3 frontal metastases Lisbeth Renshaw)  History of Present Illness: The patient's records from the referring physician were obtained and reviewed and the patient interviewed to confirm this HPI.  Marcus Fuller presented to neurologic attention last month after lung cancer mets screening study demonstrated 3 right frontal metastases.  He underwent radiosurgery on 10/01/21 with Dr. Lisbeth Renshaw.  For lung adeno, Dr. Irene Limbo has been treating with Tagrisso.  Since Tagrisso was started, he does complain of increased fatigue, lethargy.  His wife also describes an episode a couple of weeks ago, described as "woke up from a nap extremely confused, disoriented, unable to speak properly for several hours".  His Parkinson's symptoms, tremor and slow movements, may be somewhat worse as well.  He is currently doing little at home, mostly watching television.  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    Medications: Current Outpatient Medications on File Prior to Visit  Medication Sig Dispense Refill   acetaminophen (TYLENOL) 325 MG tablet Take 650 mg by mouth every 6 (six) hours as needed.     carbidopa-levodopa (SINEMET) 25-250 MG tablet Take 1 tablet by mouth 3 (three) times daily. (Patient taking differently: Take 1 tablet by mouth 3 (three) times daily. Takes 2 tablets in morning and 1 tablet in the evening to achieve 3 times daily.) 270 tablet 3   digoxin (LANOXIN) 0.125 MG tablet Take 1 tablet (125 mcg total) by mouth 4 (four) times a week. Take Tuesday, Thursday, Saturday and Sunday 16 tablet 11   ELIQUIS 5 MG TABS tablet TAKE 1 TABLET BY MOUTH TWICE A DAY 60 tablet 5   gabapentin (NEURONTIN) 600 MG tablet 1/2 tablet in the morning and midday, 1 at night (Patient taking differently: 600 mg 2 (two) times daily.) 180 tablet 3   HYDROmorphone (DILAUDID) 2 MG tablet Take 1 tablet (2 mg total) by mouth every 4 (four) hours as needed for severe pain. 30 tablet 0   Melatonin 10 MG TABS Take 10 mg by mouth at bedtime.     osimertinib mesylate (TAGRISSO) 80 MG tablet Take 1 tablet (80 mg total) by mouth daily. 30 tablet 1   No current facility-administered medications on file prior to visit.    Allergies:  Allergies  Allergen Reactions   Atorvastatin Other (See Comments)    Joint pains.   Codeine Itching   Past Medical History:  Past Medical History:  Diagnosis Date   Anxiety    Cataract  beginning stages   Chronic low back pain 07/03/2019   Coronary artery disease    stent- 2007    History of kidney stones    Hypertension    Parkinson's disease ([place]) 01/29/2018   Presence of permanent cardiac pacemaker    2017    Tremor of right hand    Past Surgical History:  Past Surgical History:  Procedure Laterality Date   APPENDECTOMY     bbb     CARDIOVERSION N/A 04/19/2021   Procedure: CARDIOVERSION;  Surgeon: Lelon Perla, MD;   Location: [facility];  Service: Cardiovascular;  Laterality: N/A;   CHOLECYSTECTOMY N/A 05/21/2018   Procedure: LAPAROSCOPIC CHOLECYSTECTOMY;  Surgeon: Coralie Keens, MD;  Location: [facility] ORS;  Service: General;  Laterality: N/A;   left knee arthroscopy   1996   PACEMAKER INSERTION  11/2016   STENT PLACEMENT VASCULAR ([place] HX)  2007   TONSILLECTOMY     Social History:  Social History   Socioeconomic History   Marital status: Married    Spouse name: Cecelia   Number of children: 2   Years of education: 14   Highest education level: Not on file  Occupational History   Not on file  Tobacco Use   Smoking status: Never   Smokeless tobacco: Never  Vaping Use   Vaping Use: Never used  Substance and Sexual Activity   Alcohol use: Not Currently    Comment: social    Drug use: No   Sexual activity: Not Currently  Other Topics Concern   Not on file  Social History Narrative   Lives w/ wife   Caffeine use: sometimes   Right handed    Social Determinants of Health   Financial Resource Strain: Not on file  Food Insecurity: Not on file  Transportation Needs: Not on file  Physical Activity: Not on file  Stress: Not on file  Social Connections: Not on file  Intimate Partner Violence: Not on file   Family History:  Family History  Problem Relation Age of Onset   Heart disease Mother    Diabetes Mother    Cancer Mother        breast   Heart disease Father    Heart attack Father    Atrial fibrillation Sister    Heart disease Brother    Pancreatic disease Brother    Tremor Brother    Heart attack Paternal Grandfather    Spina bifida Brother     Review of Systems: Constitutional: Doesn't report fevers, chills or abnormal weight loss Eyes: Doesn't report blurriness of vision Ears, nose, mouth, throat, and face: Doesn't report sore throat Respiratory: Doesn't report cough, dyspnea or wheezes Cardiovascular: Doesn't report palpitation, chest discomfort  Gastrointestinal:   Doesn't report nausea, constipation, diarrhea GU: Doesn't report incontinence Skin: Doesn't report skin rashes Neurological: Per HPI Musculoskeletal: Doesn't report joint pain Behavioral/Psych: Doesn't report anxiety  Physical Exam: Vitals:   11/09/21 0958  BP: 99/80  Pulse: 88  Resp: 18  Temp: 97.7 F (36.5 C)  SpO2: 98%   KPS: 60. General: Alert, cooperative, pleasant, in no acute distress Head: Normal EENT: No conjunctival injection or scleral icterus.  Lungs: Resp effort normal Cardiac: Regular rate Abdomen: Non-distended abdomen Skin: No rashes cyanosis or petechiae. Extremities: No clubbing or edema  Neurologic Exam: Mental Status: Awake, alert, attentive to examiner. Oriented to self and environment. Language is fluent with intact comprehension. Impaired short term recall, advanced psychomotor slowing. Cranial Nerves: Visual acuity is grossly normal. Visual fields  are full. Extra-ocular movements intact. No ptosis. Face is symmetric Motor: Cogwheel rigidity. Power is full in both arms and legs w/ bradykinesia. Reflexes are symmetric, no pathologic reflexes present.  Sensory: Intact to light touch Gait: Deferred   Labs: I have reviewed the data as listed    Component Value Date/Time   NA 140 10/21/2021 1414   NA 141 07/06/2020 0949   K 4.4 10/21/2021 1414   CL 105 10/21/2021 1414   CO2 27 10/21/2021 1414   GLUCOSE 92 10/21/2021 1414   BUN 25 (H) 10/21/2021 1414   BUN 16 07/06/2020 0949   CREATININE 0.96 10/21/2021 1414   CALCIUM 8.8 (L) 10/21/2021 1414   PROT 5.2 (L) 10/21/2021 1414   PROT 6.6 12/28/2018 1245   ALBUMIN 3.1 (L) 10/21/2021 1414   ALBUMIN 4.4 12/28/2018 1245   AST 23 10/21/2021 1414   ALT 21 10/21/2021 1414   ALKPHOS 92 10/21/2021 1414   BILITOT 1.0 10/21/2021 1414   GFRNONAA >60 10/21/2021 1414   GFRAA 85 07/06/2020 0949   Lab Results  Component Value Date   WBC 4.6 10/21/2021   NEUTROABS 3.6 10/21/2021   HGB 15.2 10/21/2021   HCT  47.7 10/21/2021   MCV 89.3 10/21/2021   PLT 57 (L) 10/21/2021    Imaging:  CLINICAL DATA:  Brain/CNS neoplasm, staging. SRS treatment planning. Non-small cell lung cancer.   EXAM: MRI HEAD WITHOUT AND WITH CONTRAST   TECHNIQUE: Multiplanar, multiecho pulse sequences of the brain and surrounding structures were obtained without and with intravenous contrast.   CONTRAST:  104mL GADAVIST GADOBUTROL 1 MMOL/ML IV SOLN   COMPARISON:  MR Head 09/13/2021.   FINDINGS: Brain: There is no evidence of an acute infarct, intracranial hemorrhage, midline shift, or extra-axial fluid collection. Patchy T2 hyperintensities throughout the cerebral white matter bilaterally are unchanged and nonspecific but compatible with moderate to severe chronic small vessel ischemic disease. There is mild generalized cerebral atrophy.   Three ring-enhancing lesions in the right frontal lobe are unchanged from the recent prior, measuring 9 mm (series 20, image 113), 10 mm (series 20, image 117), and 8 mm (series 20, image 133). There is no significant associated edema, and no new lesions are identified. A 9 mm homogeneously enhancing extra-axial mass in the right middle cranial fossa is unchanged (series 20, image 85).   Vascular: Major intracranial vascular flow voids are preserved.   Skull and upper cervical spine: Unremarkable bone marrow signal.   Sinuses/Orbits: Bilateral cataract extraction. Paranasal sinuses and mastoid air cells are clear.   Other: None.   IMPRESSION: 1. Three unchanged metastases in the right frontal lobe. No new lesions identified. 2. Unchanged 9 mm extra-axial mass in the right middle cranial fossa most consistent with a meningioma. 3. Moderate to severe chronic small vessel ischemic disease.     Electronically Signed   By: Logan Bores M.D.   On: 09/25/2021 10:03  Assessment/Plan Parkinson's disease ([place])  Brain metastases ([place])  Focal seizure ([place])  Marcus Fuller presents with clinical and radiographic syndrome localizing to the right frontal lobe.  His episode of confusion, aphasia recently is most consistent with focal seizure, possibly provoked by radiosurgery days prior.    We reviewed epilepsy safety, driving restrictions.    Parkinsons and cognitive impairment (vascular vs MCI or early Alzheimers) are decompensated in setting of CNS neoplasm, radiation, systemic therapy.  We recommended initiating Keppra 500mg  BID for seizure prevention.  Sinemet may be increased to 100/400mg  total daily dose  if tolerated and helpful.    Advocated strongly for working with PT and OT to recover function, he will continue to decompensate if remains inactive.  Will con't Tagrisso and follow up with Dr. Irene Limbo.  He will also maintain follow up with his general neurologist given continuity of care, complexity of needs.  We spent twenty additional minutes teaching regarding the natural history, biology, and historical experience in the treatment of neurologic complications of cancer.   We appreciate the opportunity to participate in the care of Marcus Fuller.   All questions were answered. The patient knows to call the clinic with any problems, questions or concerns. No barriers to learning were detected.  The total time spent in the encounter was 40 minutes and more than 50% was on counseling and review of test results   Ventura Sellers, MD Medical Director of Neuro-Oncology [facility] at [place] 11/09/21 4:16 PM

## 2021-11-09 NOTE — Progress Notes (Signed)
Marcus Fuller  [phone] [phone]   Name: Marcus Fuller Date: 11/09/2021 MRN: [id number]  DOB: 1940-12-26  Patient Care Team: Marcus Caraway, MD as PCP - General (Family Medicine) Marcus Klein, MD as PCP - Cardiology (Cardiology) Marcus Fuller, [place] as Referring Physician (Chiropractic Medicine) Pickenpack-Cousar, Carlena Sax, NP as Nurse Practitioner (Nurse Practitioner)    Marcus Fuller is a 80 y.o. male with multiple medical problems including hypertension, Parkinson's disease, CAD s/p PCI (2007), pacemaker, chronic low back pain, and atrial fibrillation (Digoxin and Eliquis). Patient with newly diagnosed metastatic lung adenocarcinoma with frontal lobe brain mets and bone lesions. Palliative ask to see for symptom management and goals of care.    SOCIAL HISTORY:     reports that he has never smoked. He has never used smokeless tobacco. He reports that he does not currently use alcohol. He reports that he does not use drugs.  ADVANCE DIRECTIVES:  Patient does not have completed advanced directives.  Discussed at length with both he and his wife.  Wife confirms that have advanced directive packet and plans to review.  She will reach out if they have any further questions.  Advanced directive clinic dates provided and wife will let us know if she would like a referral to the clinic.  CODE STATUS:   PAST MEDICAL HISTORY: Past Medical History:  Diagnosis Date   Anxiety    Cataract    beginning stages   Chronic low back pain 07/03/2019   Coronary artery disease    stent- 2007    History of kidney stones    Hypertension    Parkinson's disease ([place]) 01/29/2018   Presence of permanent cardiac pacemaker    2017    Tremor of right hand      HEMATOLOGY/ONCOLOGY HISTORY:  Oncology History  Primary adenocarcinoma of upper lobe of right lung ([place])  09/08/2021 Initial Diagnosis   Primary adenocarcinoma of upper  lobe of right lung ([place])   09/22/2021 Cancer Staging   Staging form: Lung, AJCC 8th Edition - Clinical: Stage IVB (cT1b, cNX, cM1c) - Signed by Marcus Genera, MD on 09/22/2021 Histologic grade (G): GX Histologic grading system: 4 grade system      ALLERGIES:  is allergic to atorvastatin and codeine.  MEDICATIONS:  Current Outpatient Medications  Medication Sig Dispense Refill   triamcinolone 0.1% oint-Eucerin equivalent cream 1:1 mixture Apply topically 2 (two) times daily. 30 g 2   acetaminophen (TYLENOL) 325 MG tablet Take 650 mg by mouth every 6 (six) hours as needed.     carbidopa-levodopa (SINEMET) 25-250 MG tablet Take 1 tablet by mouth 3 (three) times daily. (Patient taking differently: Take 1 tablet by mouth 3 (three) times daily. Takes 2 tablets in morning and 1 tablet in the evening to achieve 3 times daily.) 270 tablet 3   digoxin (LANOXIN) 0.125 MG tablet Take 1 tablet (125 mcg total) by mouth 4 (four) times a week. Take Tuesday, Thursday, Saturday and Sunday 16 tablet 11   ELIQUIS 5 MG TABS tablet TAKE 1 TABLET BY MOUTH TWICE A DAY 60 tablet 5   gabapentin (NEURONTIN) 600 MG tablet 1/2 tablet in the morning and midday, 1 at night (Patient taking differently: 600 mg 2 (two) times daily.) 180 tablet 3   HYDROmorphone (DILAUDID) 2 MG tablet Take 1 tablet (2 mg total) by mouth every 4 (four) hours as needed for severe pain. 30 tablet 0   Melatonin 10 MG TABS  Take 10 mg by mouth at bedtime.     osimertinib mesylate (TAGRISSO) 80 MG tablet Take 1 tablet (80 mg total) by mouth daily. 30 tablet 1   No current facility-administered medications for this visit.    VITAL SIGNS: There were no vitals taken for this visit. There were no vitals filed for this visit.  Estimated body mass index is 26.96 kg/m as calculated from the following:   Height as of an earlier encounter on 11/09/21: 5\' 11"  (1.803 m).   Weight as of 09/16/21: 193 lb 4.8 oz (87.7 kg).  LABS: CBC:     Component Value Date/Time   WBC 4.6 10/21/2021 1414   WBC 9.4 08/19/2021 1302   HGB 15.2 10/21/2021 1414   HGB 15.5 12/28/2018 1245   HCT 47.7 10/21/2021 1414   HCT 46.3 12/28/2018 1245   PLT 57 (L) 10/21/2021 1414   PLT 165 12/28/2018 1245   MCV 89.3 10/21/2021 1414   MCV 84 12/28/2018 1245   NEUTROABS 3.6 10/21/2021 1414   NEUTROABS 6.3 12/28/2018 1245   LYMPHSABS 0.4 (L) 10/21/2021 1414   LYMPHSABS 2.1 12/28/2018 1245   MONOABS 0.3 10/21/2021 1414   EOSABS 0.0 10/21/2021 1414   EOSABS 0.2 12/28/2018 1245   BASOSABS 0.0 10/21/2021 1414   BASOSABS 0.1 12/28/2018 1245   Comprehensive Metabolic Panel:    Component Value Date/Time   NA 140 10/21/2021 1414   NA 141 07/06/2020 0949   K 4.4 10/21/2021 1414   CL 105 10/21/2021 1414   CO2 27 10/21/2021 1414   BUN 25 (H) 10/21/2021 1414   BUN 16 07/06/2020 0949   CREATININE 0.96 10/21/2021 1414   GLUCOSE 92 10/21/2021 1414   CALCIUM 8.8 (L) 10/21/2021 1414   AST 23 10/21/2021 1414   ALT 21 10/21/2021 1414   ALKPHOS 92 10/21/2021 1414   BILITOT 1.0 10/21/2021 1414   PROT 5.2 (L) 10/21/2021 1414   PROT 6.6 12/28/2018 1245   ALBUMIN 3.1 (L) 10/21/2021 1414   ALBUMIN 4.4 12/28/2018 1245     PERFORMANCE STATUS (ECOG) : 3 - Symptomatic, >50% confined to bed   Physical Exam General: NAD, weak appearing, sitting in wheelchair leaning over Cardiovascular: regular rate and rhythm Pulmonary: clear ant fields Abdomen: soft, nontender, + bowel sounds Extremities: no edema, no joint deformities Skin: no rashes, skin dry and flaky  Neurological: Weakness but otherwise nonfocal  IMPRESSION:  Marcus Fuller presents to the clinic today for follow up with support of his wife. He is in a wheelchair. Complains of back pain and leg pain with movement. He is able to answer most questions appropriately. Appears weak.   Marcus Fuller shares patient continues to show signs of weakness. PT/OT has recently been out to the home for evaluation  with plans to initiate services on next week. She reports increased difficulty with positioning and transferring patient due to limited mobility and decreased strength. Family is attempting to secure a lift chair in the home to assist with needs.  We discussed the use of home equipment to allow for easier access and assistance with ADLs.  Wife reports they do have grab bars in the bathroom however would like assistance with transfer shower bench and hand-held shower nozzle.  Patient is having some lower back pain which wife states is chronic.  Also experiences pain with mobility.  He does have hydromorphone as needed for pain.  Discussed use of lidocaine patch or topical treatments such as Voltaren gel.  Wife states they have used  both in the past with little to no effect.  If they decide to try again she will reach out and let us know.  Patient has increased dryness and skin flaking to lower extremity.  Wife states she has been attempting to moisturize however continues to be of concern.  Discussed use of Eucerin cream twice daily and after showers to assist in added skin moisture.  [name] states he is looking forward to spending Thanksgiving with his family.  He shares some of his favorite foods and enjoyment of being amongst his children and grandchildren.  We discussed Her current illness and what it means in the larger context of Her on-going co-morbidities. Natural disease trajectory and expectations were discussed.  I discussed the importance of continued conversation with family and their medical providers regarding overall plan of care and treatment options, ensuring decisions are within the context of the patients values and GOCs.  PLAN: Hydromorphone as needed for pain.  Wife will notify clinic if they decide to restart lidocaine and/or Voltaren gel. Shower transfer bench, grab bars, hand-held shower head to be ordered from [employer]. Triamcinolone/Eucerin cream to skin twice daily and as needed   PT/OT/SLP currently evaluating with plans to initiate treatment in the next 1-2 weeks.  Wife states patient is having some dysphagia.  Will notify medical team if this continues to be of concern. Continue to encourage ongoing goals of care discussions with family and medical providers regarding overall plan of care and treatment options. I will plan to see patient back in the clinic in approximately 2 weeks in collaboration with future oncology/treatment visits.   Patient and wife expressed understanding and was in agreement with this plan. He also understands that He can call the clinic at any time with any questions, concerns, or complaints.     Time Total: 40 min  Visit consisted of counseling and education dealing with the complex and emotionally intense issues of symptom management and palliative care in the setting of serious and potentially life-threatening illness.Greater than 50%  of this time was spent counseling and coordinating care related to the above assessment and plan.  Signed by: Alda Lea, AGPCNP-BC Palliative Medicine Team

## 2021-11-10 DIAGNOSIS — Z7901 Long term (current) use of anticoagulants: Secondary | ICD-10-CM | POA: Diagnosis not present

## 2021-11-10 DIAGNOSIS — I482 Chronic atrial fibrillation, unspecified: Secondary | ICD-10-CM | POA: Diagnosis not present

## 2021-11-10 DIAGNOSIS — Z9181 History of falling: Secondary | ICD-10-CM | POA: Diagnosis not present

## 2021-11-10 DIAGNOSIS — I10 Essential (primary) hypertension: Secondary | ICD-10-CM | POA: Diagnosis not present

## 2021-11-10 DIAGNOSIS — Z79891 Long term (current) use of opiate analgesic: Secondary | ICD-10-CM | POA: Diagnosis not present

## 2021-11-10 DIAGNOSIS — H3530 Unspecified macular degeneration: Secondary | ICD-10-CM | POA: Diagnosis not present

## 2021-11-10 DIAGNOSIS — I2510 Atherosclerotic heart disease of native coronary artery without angina pectoris: Secondary | ICD-10-CM | POA: Diagnosis not present

## 2021-11-10 DIAGNOSIS — M5136 Other intervertebral disc degeneration, lumbar region: Secondary | ICD-10-CM | POA: Diagnosis not present

## 2021-11-10 DIAGNOSIS — G20 Parkinson's disease: Secondary | ICD-10-CM | POA: Diagnosis not present

## 2021-11-10 DIAGNOSIS — E785 Hyperlipidemia, unspecified: Secondary | ICD-10-CM | POA: Diagnosis not present

## 2021-11-10 DIAGNOSIS — C3411 Malignant neoplasm of upper lobe, right bronchus or lung: Secondary | ICD-10-CM | POA: Diagnosis not present

## 2021-11-10 DIAGNOSIS — C7931 Secondary malignant neoplasm of brain: Secondary | ICD-10-CM | POA: Diagnosis not present

## 2021-11-10 DIAGNOSIS — Z79899 Other long term (current) drug therapy: Secondary | ICD-10-CM | POA: Diagnosis not present

## 2021-11-10 DIAGNOSIS — G893 Neoplasm related pain (acute) (chronic): Secondary | ICD-10-CM | POA: Diagnosis not present

## 2021-11-10 DIAGNOSIS — C7951 Secondary malignant neoplasm of bone: Secondary | ICD-10-CM | POA: Diagnosis not present

## 2021-11-10 DIAGNOSIS — M48061 Spinal stenosis, lumbar region without neurogenic claudication: Secondary | ICD-10-CM | POA: Diagnosis not present

## 2021-11-10 DIAGNOSIS — Z993 Dependence on wheelchair: Secondary | ICD-10-CM | POA: Diagnosis not present

## 2021-11-15 ENCOUNTER — Other Ambulatory Visit: Payer: Self-pay | Admitting: Hematology

## 2021-11-16 DIAGNOSIS — Z79899 Other long term (current) drug therapy: Secondary | ICD-10-CM | POA: Diagnosis not present

## 2021-11-16 DIAGNOSIS — I2510 Atherosclerotic heart disease of native coronary artery without angina pectoris: Secondary | ICD-10-CM | POA: Diagnosis not present

## 2021-11-16 DIAGNOSIS — C3411 Malignant neoplasm of upper lobe, right bronchus or lung: Secondary | ICD-10-CM | POA: Diagnosis not present

## 2021-11-16 DIAGNOSIS — Z79891 Long term (current) use of opiate analgesic: Secondary | ICD-10-CM | POA: Diagnosis not present

## 2021-11-16 DIAGNOSIS — H3530 Unspecified macular degeneration: Secondary | ICD-10-CM | POA: Diagnosis not present

## 2021-11-16 DIAGNOSIS — Z993 Dependence on wheelchair: Secondary | ICD-10-CM | POA: Diagnosis not present

## 2021-11-16 DIAGNOSIS — C7951 Secondary malignant neoplasm of bone: Secondary | ICD-10-CM | POA: Diagnosis not present

## 2021-11-16 DIAGNOSIS — I482 Chronic atrial fibrillation, unspecified: Secondary | ICD-10-CM | POA: Diagnosis not present

## 2021-11-16 DIAGNOSIS — C7931 Secondary malignant neoplasm of brain: Secondary | ICD-10-CM | POA: Diagnosis not present

## 2021-11-16 DIAGNOSIS — Z9181 History of falling: Secondary | ICD-10-CM | POA: Diagnosis not present

## 2021-11-16 DIAGNOSIS — G20 Parkinson's disease: Secondary | ICD-10-CM | POA: Diagnosis not present

## 2021-11-16 DIAGNOSIS — M48061 Spinal stenosis, lumbar region without neurogenic claudication: Secondary | ICD-10-CM | POA: Diagnosis not present

## 2021-11-16 DIAGNOSIS — E785 Hyperlipidemia, unspecified: Secondary | ICD-10-CM | POA: Diagnosis not present

## 2021-11-16 DIAGNOSIS — G893 Neoplasm related pain (acute) (chronic): Secondary | ICD-10-CM | POA: Diagnosis not present

## 2021-11-16 DIAGNOSIS — M5136 Other intervertebral disc degeneration, lumbar region: Secondary | ICD-10-CM | POA: Diagnosis not present

## 2021-11-16 DIAGNOSIS — I10 Essential (primary) hypertension: Secondary | ICD-10-CM | POA: Diagnosis not present

## 2021-11-16 DIAGNOSIS — Z7901 Long term (current) use of anticoagulants: Secondary | ICD-10-CM | POA: Diagnosis not present

## 2021-11-17 ENCOUNTER — Telehealth: Payer: Self-pay

## 2021-11-17 DIAGNOSIS — G20 Parkinson's disease: Secondary | ICD-10-CM | POA: Diagnosis not present

## 2021-11-17 DIAGNOSIS — Z9181 History of falling: Secondary | ICD-10-CM | POA: Diagnosis not present

## 2021-11-17 DIAGNOSIS — Z79899 Other long term (current) drug therapy: Secondary | ICD-10-CM | POA: Diagnosis not present

## 2021-11-17 DIAGNOSIS — Z993 Dependence on wheelchair: Secondary | ICD-10-CM | POA: Diagnosis not present

## 2021-11-17 DIAGNOSIS — C3411 Malignant neoplasm of upper lobe, right bronchus or lung: Secondary | ICD-10-CM | POA: Diagnosis not present

## 2021-11-17 DIAGNOSIS — M48061 Spinal stenosis, lumbar region without neurogenic claudication: Secondary | ICD-10-CM | POA: Diagnosis not present

## 2021-11-17 DIAGNOSIS — I2510 Atherosclerotic heart disease of native coronary artery without angina pectoris: Secondary | ICD-10-CM | POA: Diagnosis not present

## 2021-11-17 DIAGNOSIS — I482 Chronic atrial fibrillation, unspecified: Secondary | ICD-10-CM | POA: Diagnosis not present

## 2021-11-17 DIAGNOSIS — Z7901 Long term (current) use of anticoagulants: Secondary | ICD-10-CM | POA: Diagnosis not present

## 2021-11-17 DIAGNOSIS — M5136 Other intervertebral disc degeneration, lumbar region: Secondary | ICD-10-CM | POA: Diagnosis not present

## 2021-11-17 DIAGNOSIS — I10 Essential (primary) hypertension: Secondary | ICD-10-CM | POA: Diagnosis not present

## 2021-11-17 DIAGNOSIS — C7951 Secondary malignant neoplasm of bone: Secondary | ICD-10-CM | POA: Diagnosis not present

## 2021-11-17 DIAGNOSIS — C7931 Secondary malignant neoplasm of brain: Secondary | ICD-10-CM | POA: Diagnosis not present

## 2021-11-17 DIAGNOSIS — G893 Neoplasm related pain (acute) (chronic): Secondary | ICD-10-CM | POA: Diagnosis not present

## 2021-11-17 DIAGNOSIS — H3530 Unspecified macular degeneration: Secondary | ICD-10-CM | POA: Diagnosis not present

## 2021-11-17 DIAGNOSIS — E785 Hyperlipidemia, unspecified: Secondary | ICD-10-CM | POA: Diagnosis not present

## 2021-11-17 DIAGNOSIS — Z79891 Long term (current) use of opiate analgesic: Secondary | ICD-10-CM | POA: Diagnosis not present

## 2021-11-17 NOTE — Telephone Encounter (Signed)
Marcus Fuller called last night regarding Marcus Fuller having "blue toes". I returned her call this morning and she stated that they weren't blue anymore this morning, more bruised. She said that Marcus Fuller was getting out of bed and crammed his toes under their dresser. I advised her that she could go to Urgent Care to be evaluated to see if his toes were broken or call their PCP. Understanding verbalized. All questions answered. Advised to call back with questions/concerns.

## 2021-11-18 ENCOUNTER — Other Ambulatory Visit: Payer: Self-pay | Admitting: *Deleted

## 2021-11-18 DIAGNOSIS — C3411 Malignant neoplasm of upper lobe, right bronchus or lung: Secondary | ICD-10-CM | POA: Diagnosis not present

## 2021-11-18 DIAGNOSIS — H3530 Unspecified macular degeneration: Secondary | ICD-10-CM | POA: Diagnosis not present

## 2021-11-18 DIAGNOSIS — Z79891 Long term (current) use of opiate analgesic: Secondary | ICD-10-CM | POA: Diagnosis not present

## 2021-11-18 DIAGNOSIS — C7931 Secondary malignant neoplasm of brain: Secondary | ICD-10-CM | POA: Diagnosis not present

## 2021-11-18 DIAGNOSIS — M5136 Other intervertebral disc degeneration, lumbar region: Secondary | ICD-10-CM | POA: Diagnosis not present

## 2021-11-18 DIAGNOSIS — I2510 Atherosclerotic heart disease of native coronary artery without angina pectoris: Secondary | ICD-10-CM | POA: Diagnosis not present

## 2021-11-18 DIAGNOSIS — Z993 Dependence on wheelchair: Secondary | ICD-10-CM | POA: Diagnosis not present

## 2021-11-18 DIAGNOSIS — E785 Hyperlipidemia, unspecified: Secondary | ICD-10-CM | POA: Diagnosis not present

## 2021-11-18 DIAGNOSIS — I10 Essential (primary) hypertension: Secondary | ICD-10-CM | POA: Diagnosis not present

## 2021-11-18 DIAGNOSIS — I482 Chronic atrial fibrillation, unspecified: Secondary | ICD-10-CM | POA: Diagnosis not present

## 2021-11-18 DIAGNOSIS — M48061 Spinal stenosis, lumbar region without neurogenic claudication: Secondary | ICD-10-CM | POA: Diagnosis not present

## 2021-11-18 DIAGNOSIS — C7951 Secondary malignant neoplasm of bone: Secondary | ICD-10-CM

## 2021-11-18 DIAGNOSIS — G893 Neoplasm related pain (acute) (chronic): Secondary | ICD-10-CM | POA: Diagnosis not present

## 2021-11-18 DIAGNOSIS — Z79899 Other long term (current) drug therapy: Secondary | ICD-10-CM | POA: Diagnosis not present

## 2021-11-18 DIAGNOSIS — Z9181 History of falling: Secondary | ICD-10-CM | POA: Diagnosis not present

## 2021-11-18 DIAGNOSIS — G20 Parkinson's disease: Secondary | ICD-10-CM | POA: Diagnosis not present

## 2021-11-18 DIAGNOSIS — Z7901 Long term (current) use of anticoagulants: Secondary | ICD-10-CM | POA: Diagnosis not present

## 2021-11-19 ENCOUNTER — Encounter: Payer: Self-pay | Admitting: Hematology

## 2021-11-19 ENCOUNTER — Inpatient Hospital Stay: Payer: Medicare Other | Admitting: Hematology

## 2021-11-19 ENCOUNTER — Inpatient Hospital Stay: Payer: Medicare Other | Admitting: Nurse Practitioner

## 2021-11-19 ENCOUNTER — Observation Stay (HOSPITAL_COMMUNITY): Payer: Medicare Other

## 2021-11-19 ENCOUNTER — Other Ambulatory Visit: Payer: Self-pay

## 2021-11-19 ENCOUNTER — Inpatient Hospital Stay: Payer: Medicare Other

## 2021-11-19 ENCOUNTER — Inpatient Hospital Stay (HOSPITAL_COMMUNITY)
Admission: AD | Admit: 2021-11-19 | Discharge: 2021-11-25 | DRG: 071 | Disposition: A | Discharge status: Hospice | Payer: Medicare Other | Source: Ambulatory Visit | Attending: Internal Medicine | Admitting: Internal Medicine

## 2021-11-19 ENCOUNTER — Inpatient Hospital Stay (HOSPITAL_BASED_OUTPATIENT_CLINIC_OR_DEPARTMENT_OTHER): Payer: Medicare Other | Admitting: Nurse Practitioner

## 2021-11-19 ENCOUNTER — Inpatient Hospital Stay: Payer: Medicare Other | Attending: Hematology

## 2021-11-19 ENCOUNTER — Encounter: Payer: Medicare Other | Admitting: Nurse Practitioner

## 2021-11-19 VITALS — BP 83/64 | HR 78 | Temp 97.9°F | Resp 17 | Ht 71.0 in

## 2021-11-19 DIAGNOSIS — G20 Parkinson's disease: Secondary | ICD-10-CM | POA: Diagnosis not present

## 2021-11-19 DIAGNOSIS — Z8249 Family history of ischemic heart disease and other diseases of the circulatory system: Secondary | ICD-10-CM

## 2021-11-19 DIAGNOSIS — T50995A Adverse effect of other drugs, medicaments and biological substances, initial encounter: Secondary | ICD-10-CM | POA: Diagnosis present

## 2021-11-19 DIAGNOSIS — M2578 Osteophyte, vertebrae: Secondary | ICD-10-CM | POA: Diagnosis not present

## 2021-11-19 DIAGNOSIS — Z9049 Acquired absence of other specified parts of digestive tract: Secondary | ICD-10-CM

## 2021-11-19 DIAGNOSIS — R627 Adult failure to thrive: Secondary | ICD-10-CM | POA: Diagnosis present

## 2021-11-19 DIAGNOSIS — G40209 Localization-related (focal) (partial) symptomatic epilepsy and epileptic syndromes with complex partial seizures, not intractable, without status epilepticus: Secondary | ICD-10-CM | POA: Diagnosis present

## 2021-11-19 DIAGNOSIS — R29898 Other symptoms and signs involving the musculoskeletal system: Secondary | ICD-10-CM

## 2021-11-19 DIAGNOSIS — C799 Secondary malignant neoplasm of unspecified site: Secondary | ICD-10-CM | POA: Diagnosis not present

## 2021-11-19 DIAGNOSIS — Z888 Allergy status to other drugs, medicaments and biological substances status: Secondary | ICD-10-CM | POA: Diagnosis not present

## 2021-11-19 DIAGNOSIS — Z955 Presence of coronary angioplasty implant and graft: Secondary | ICD-10-CM

## 2021-11-19 DIAGNOSIS — I110 Hypertensive heart disease with heart failure: Secondary | ICD-10-CM | POA: Diagnosis not present

## 2021-11-19 DIAGNOSIS — I480 Paroxysmal atrial fibrillation: Secondary | ICD-10-CM | POA: Diagnosis not present

## 2021-11-19 DIAGNOSIS — G9340 Encephalopathy, unspecified: Secondary | ICD-10-CM | POA: Diagnosis not present

## 2021-11-19 DIAGNOSIS — R300 Dysuria: Secondary | ICD-10-CM | POA: Diagnosis not present

## 2021-11-19 DIAGNOSIS — I5032 Chronic diastolic (congestive) heart failure: Secondary | ICD-10-CM | POA: Diagnosis not present

## 2021-11-19 DIAGNOSIS — I5030 Unspecified diastolic (congestive) heart failure: Secondary | ICD-10-CM | POA: Diagnosis not present

## 2021-11-19 DIAGNOSIS — C7931 Secondary malignant neoplasm of brain: Secondary | ICD-10-CM | POA: Diagnosis present

## 2021-11-19 DIAGNOSIS — C3491 Malignant neoplasm of unspecified part of right bronchus or lung: Secondary | ICD-10-CM | POA: Diagnosis not present

## 2021-11-19 DIAGNOSIS — F419 Anxiety disorder, unspecified: Secondary | ICD-10-CM

## 2021-11-19 DIAGNOSIS — R4182 Altered mental status, unspecified: Secondary | ICD-10-CM

## 2021-11-19 DIAGNOSIS — I998 Other disorder of circulatory system: Secondary | ICD-10-CM | POA: Diagnosis present

## 2021-11-19 DIAGNOSIS — Z885 Allergy status to narcotic agent status: Secondary | ICD-10-CM

## 2021-11-19 DIAGNOSIS — Z803 Family history of malignant neoplasm of breast: Secondary | ICD-10-CM

## 2021-11-19 DIAGNOSIS — Z87442 Personal history of urinary calculi: Secondary | ICD-10-CM | POA: Diagnosis not present

## 2021-11-19 DIAGNOSIS — Z23 Encounter for immunization: Secondary | ICD-10-CM | POA: Diagnosis not present

## 2021-11-19 DIAGNOSIS — Z7189 Other specified counseling: Secondary | ICD-10-CM

## 2021-11-19 DIAGNOSIS — R531 Weakness: Secondary | ICD-10-CM

## 2021-11-19 DIAGNOSIS — Z66 Do not resuscitate: Secondary | ICD-10-CM | POA: Diagnosis not present

## 2021-11-19 DIAGNOSIS — I2510 Atherosclerotic heart disease of native coronary artery without angina pectoris: Secondary | ICD-10-CM | POA: Diagnosis present

## 2021-11-19 DIAGNOSIS — Z515 Encounter for palliative care: Secondary | ICD-10-CM

## 2021-11-19 DIAGNOSIS — Z20822 Contact with and (suspected) exposure to covid-19: Secondary | ICD-10-CM | POA: Diagnosis present

## 2021-11-19 DIAGNOSIS — C7951 Secondary malignant neoplasm of bone: Secondary | ICD-10-CM | POA: Diagnosis present

## 2021-11-19 DIAGNOSIS — D320 Benign neoplasm of cerebral meninges: Secondary | ICD-10-CM | POA: Diagnosis present

## 2021-11-19 DIAGNOSIS — C3411 Malignant neoplasm of upper lobe, right bronchus or lung: Secondary | ICD-10-CM

## 2021-11-19 DIAGNOSIS — R17 Unspecified jaundice: Secondary | ICD-10-CM | POA: Diagnosis present

## 2021-11-19 DIAGNOSIS — R269 Unspecified abnormalities of gait and mobility: Secondary | ICD-10-CM | POA: Diagnosis present

## 2021-11-19 DIAGNOSIS — Y92009 Unspecified place in unspecified non-institutional (private) residence as the place of occurrence of the external cause: Secondary | ICD-10-CM | POA: Diagnosis not present

## 2021-11-19 DIAGNOSIS — S99921A Unspecified injury of right foot, initial encounter: Secondary | ICD-10-CM | POA: Diagnosis not present

## 2021-11-19 DIAGNOSIS — R5381 Other malaise: Secondary | ICD-10-CM | POA: Diagnosis present

## 2021-11-19 DIAGNOSIS — E876 Hypokalemia: Secondary | ICD-10-CM | POA: Diagnosis present

## 2021-11-19 DIAGNOSIS — Z043 Encounter for examination and observation following other accident: Secondary | ICD-10-CM | POA: Diagnosis not present

## 2021-11-19 DIAGNOSIS — Z6823 Body mass index (BMI) 23.0-23.9, adult: Secondary | ICD-10-CM

## 2021-11-19 DIAGNOSIS — D696 Thrombocytopenia, unspecified: Secondary | ICD-10-CM | POA: Diagnosis not present

## 2021-11-19 DIAGNOSIS — M8458XA Pathological fracture in neoplastic disease, other specified site, initial encounter for fracture: Secondary | ICD-10-CM | POA: Diagnosis present

## 2021-11-19 DIAGNOSIS — G9341 Metabolic encephalopathy: Secondary | ICD-10-CM | POA: Diagnosis not present

## 2021-11-19 DIAGNOSIS — Z833 Family history of diabetes mellitus: Secondary | ICD-10-CM

## 2021-11-19 DIAGNOSIS — S99929A Unspecified injury of unspecified foot, initial encounter: Secondary | ICD-10-CM

## 2021-11-19 DIAGNOSIS — M8588 Other specified disorders of bone density and structure, other site: Secondary | ICD-10-CM | POA: Diagnosis not present

## 2021-11-19 DIAGNOSIS — Z79899 Other long term (current) drug therapy: Secondary | ICD-10-CM

## 2021-11-19 DIAGNOSIS — G893 Neoplasm related pain (acute) (chronic): Secondary | ICD-10-CM | POA: Diagnosis present

## 2021-11-19 DIAGNOSIS — K5900 Constipation, unspecified: Secondary | ICD-10-CM | POA: Diagnosis present

## 2021-11-19 DIAGNOSIS — E785 Hyperlipidemia, unspecified: Secondary | ICD-10-CM | POA: Diagnosis present

## 2021-11-19 DIAGNOSIS — Z79890 Hormone replacement therapy (postmenopausal): Secondary | ICD-10-CM

## 2021-11-19 DIAGNOSIS — G319 Degenerative disease of nervous system, unspecified: Secondary | ICD-10-CM | POA: Diagnosis not present

## 2021-11-19 DIAGNOSIS — Z950 Presence of cardiac pacemaker: Secondary | ICD-10-CM

## 2021-11-19 DIAGNOSIS — M4126 Other idiopathic scoliosis, lumbar region: Secondary | ICD-10-CM | POA: Diagnosis not present

## 2021-11-19 DIAGNOSIS — D329 Benign neoplasm of meninges, unspecified: Secondary | ICD-10-CM | POA: Diagnosis not present

## 2021-11-19 DIAGNOSIS — R410 Disorientation, unspecified: Secondary | ICD-10-CM

## 2021-11-19 DIAGNOSIS — Z7901 Long term (current) use of anticoagulants: Secondary | ICD-10-CM

## 2021-11-19 LAB — TSH: TSH: 4.455 u[IU]/mL (ref 0.350–4.500)

## 2021-11-19 LAB — CBC WITH DIFFERENTIAL (CANCER CENTER ONLY)
Abs Immature Granulocytes: 0.19 10*3/uL — ABNORMAL HIGH (ref 0.00–0.07)
Basophils Absolute: 0 10*3/uL (ref 0.0–0.1)
Basophils Relative: 0 %
Eosinophils Absolute: 0.1 10*3/uL (ref 0.0–0.5)
Eosinophils Relative: 1 %
HCT: 40.1 % (ref 39.0–52.0)
Hemoglobin: 13 g/dL (ref 13.0–17.0)
Immature Granulocytes: 4 %
Lymphocytes Relative: 15 %
Lymphs Abs: 0.7 10*3/uL (ref 0.7–4.0)
MCH: 29 pg (ref 26.0–34.0)
MCHC: 32.4 g/dL (ref 30.0–36.0)
MCV: 89.3 fL (ref 80.0–100.0)
Monocytes Absolute: 0.4 10*3/uL (ref 0.1–1.0)
Monocytes Relative: 8 %
Neutro Abs: 3.6 10*3/uL (ref 1.7–7.7)
Neutrophils Relative %: 72 %
Platelet Count: 163 10*3/uL (ref 150–400)
RBC: 4.49 MIL/uL (ref 4.22–5.81)
RDW: 17.9 % — ABNORMAL HIGH (ref 11.5–15.5)
WBC Count: 5 10*3/uL (ref 4.0–10.5)
nRBC: 0 % (ref 0.0–0.2)

## 2021-11-19 LAB — CMP (CANCER CENTER ONLY)
ALT: 6 U/L (ref 0–44)
AST: 16 U/L (ref 15–41)
Albumin: 2.7 g/dL — ABNORMAL LOW (ref 3.5–5.0)
Alkaline Phosphatase: 118 U/L (ref 38–126)
Anion gap: 10 (ref 5–15)
BUN: 18 mg/dL (ref 8–23)
CO2: 23 mmol/L (ref 22–32)
Calcium: 8.7 mg/dL — ABNORMAL LOW (ref 8.9–10.3)
Chloride: 108 mmol/L (ref 98–111)
Creatinine: 0.83 mg/dL (ref 0.61–1.24)
GFR, Estimated: >60 mL/min (ref >60)
Glucose, Bld: 106 mg/dL — ABNORMAL HIGH (ref 70–99)
Potassium: 3.8 mmol/L (ref 3.5–5.1)
Sodium: 141 mmol/L (ref 135–145)
Total Bilirubin: 0.9 mg/dL (ref 0.3–1.2)
Total Protein: 5.6 g/dL — ABNORMAL LOW (ref 6.5–8.1)

## 2021-11-19 LAB — BLOOD GAS, VENOUS
Acid-Base Excess: 2.8 mmol/L — ABNORMAL HIGH (ref 0.0–2.0)
Bicarbonate: 27.2 mmol/L (ref 20.0–28.0)
O2 Saturation: 21.2 %
Patient temperature: 98.6
pCO2, Ven: 42.7 mmHg — ABNORMAL LOW (ref 44.0–60.0)
pH, Ven: 7.42 (ref 7.250–7.430)
pO2, Ven: 17.1 mmHg — CL (ref 32.0–45.0)

## 2021-11-19 LAB — AMMONIA: Ammonia: 18 umol/L (ref 9–35)

## 2021-11-19 LAB — DIGOXIN LEVEL: Digoxin Level: 0.6 ng/mL — ABNORMAL LOW (ref 0.8–2.0)

## 2021-11-19 LAB — LACTATE DEHYDROGENASE: LDH: 247 U/L — ABNORMAL HIGH (ref 98–192)

## 2021-11-19 IMAGING — CT CT L SPINE W/ CM
2 of 3 series · 9 of 27 positions shown, 11 images · IV contrast (omnipaque)
Comparison: CT chest, abdomen and pelvis [DATE].

CLINICAL DATA: Metastatic disease to the right hemipelvis.
Follow-up study.

EXAM:
CT LUMBAR SPINE WITH CONTRAST
TECHNIQUE: Multidetector CT imaging of the lumbar spine was performed with
intravenous contrast administration.
CONTRAST:  100mL OMNIPAQUE IOHEXOL 350 MG/ML SOLN

[Series 4: l spine st · axial · 0.33mm/px · z∈[-730,-506]mm · 4 of 170 slices shown, 5 images]
[im 29/170  soft-tissue]
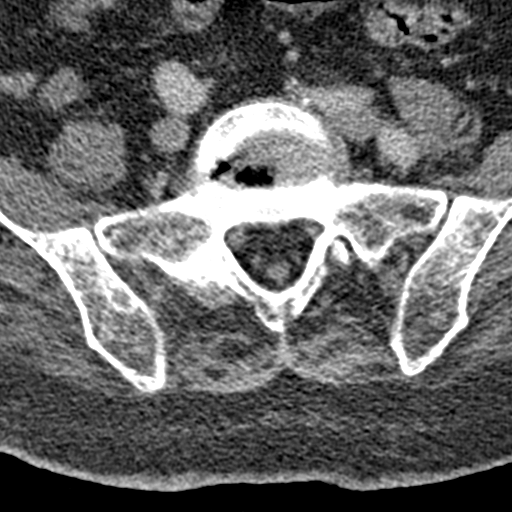
[im 29/170  bone]
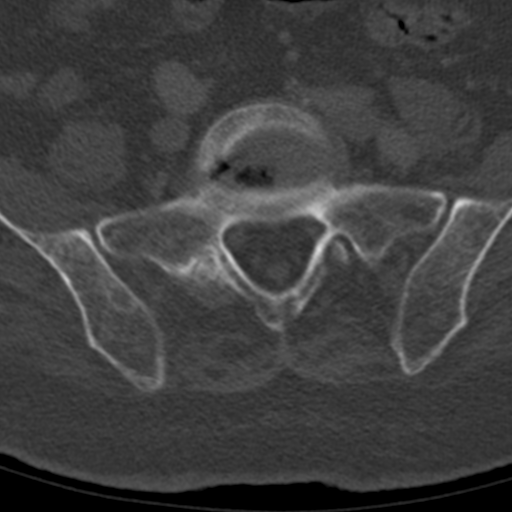
[im 57/170  bone]
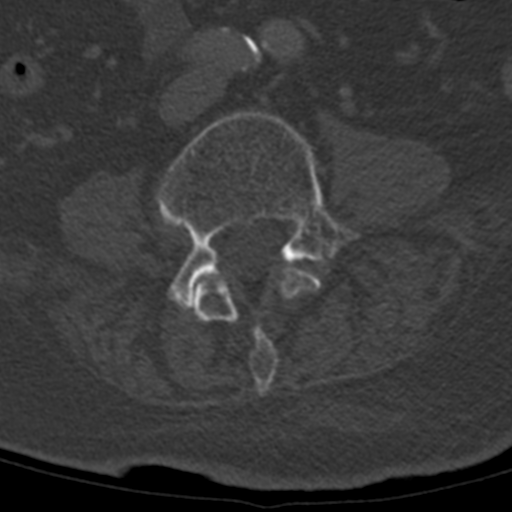
[im 113/170  bone]
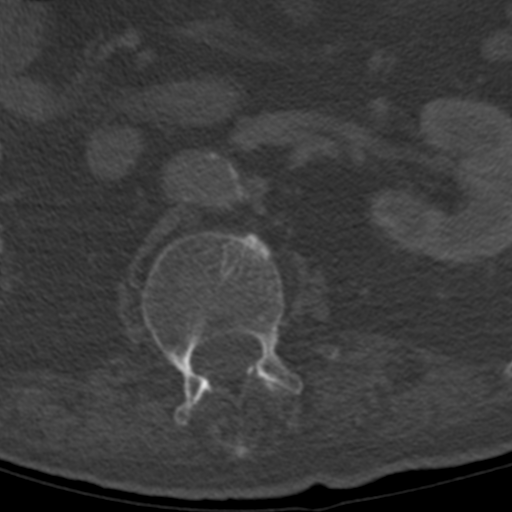
[im 141/170  bone]
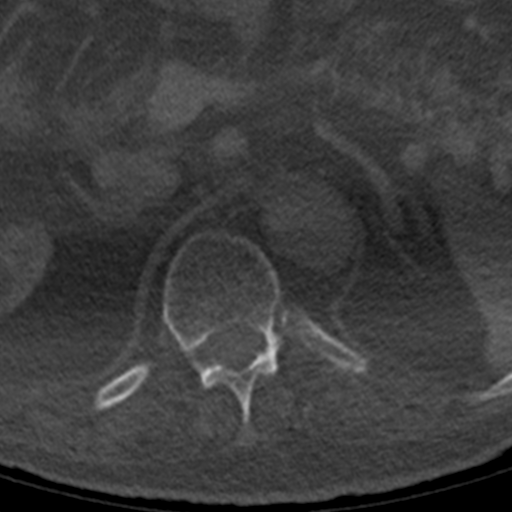

[Series 10: sagittal st · sagittal · 0.34mm/px · 5 of 64 slices shown, 6 images]
[im 22/64  bone]
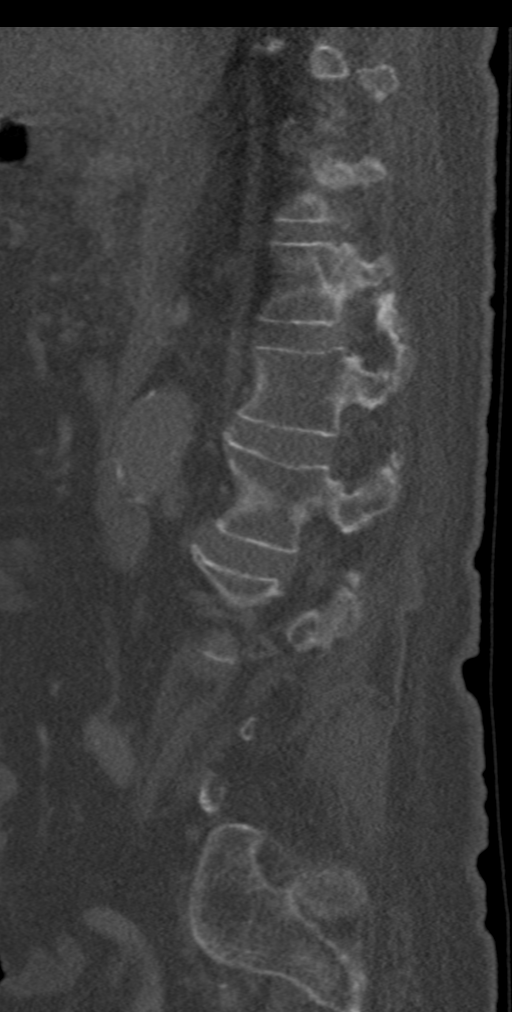
[im 27/64  bone]
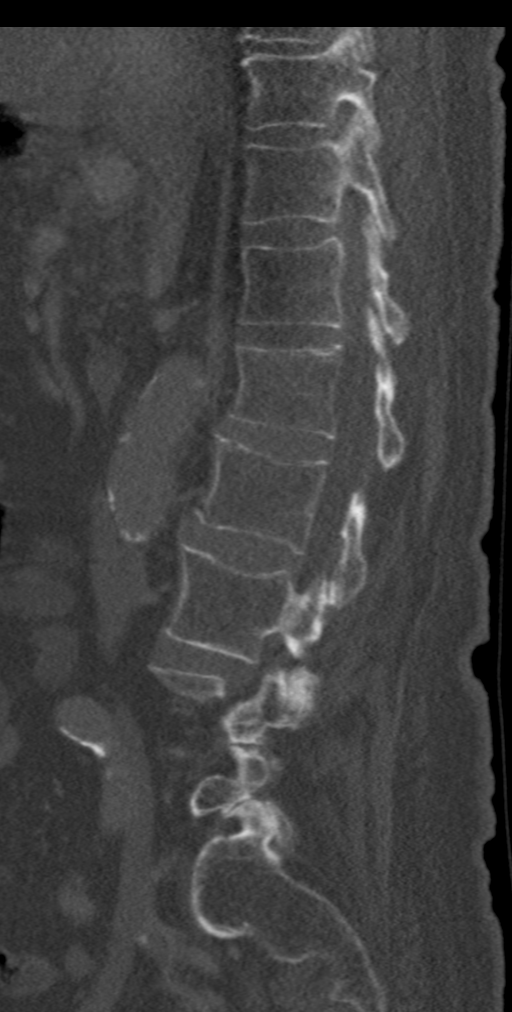
[im 32/64  soft-tissue]
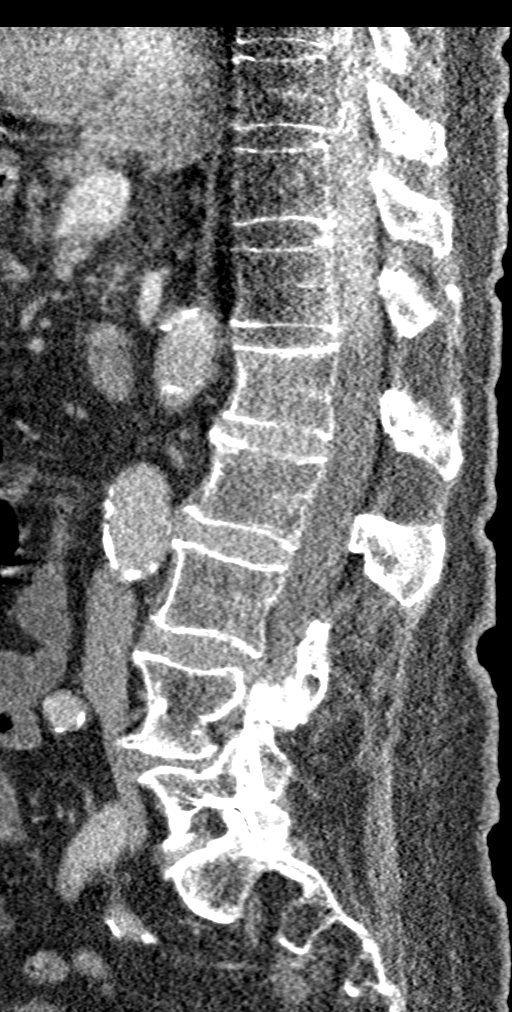
[im 32/64  bone]
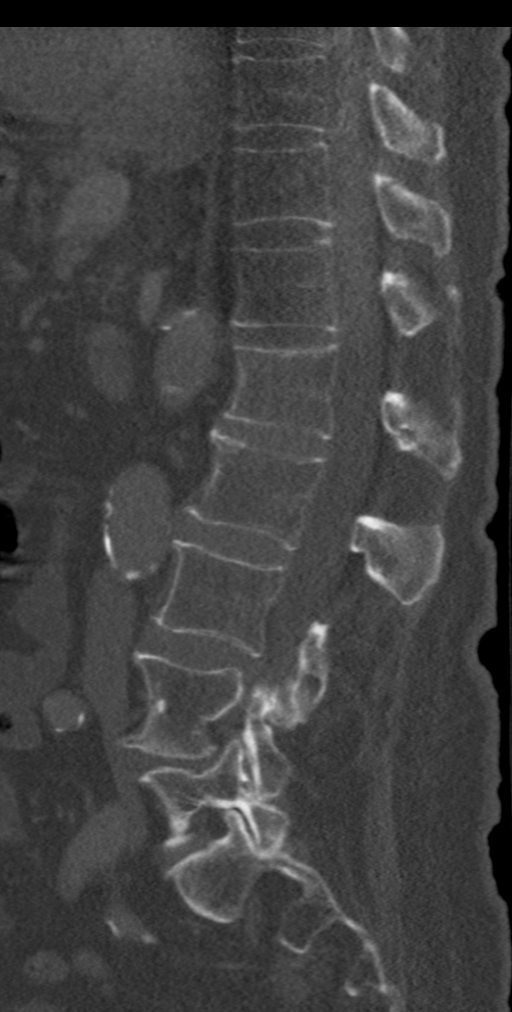
[im 37/64  bone]
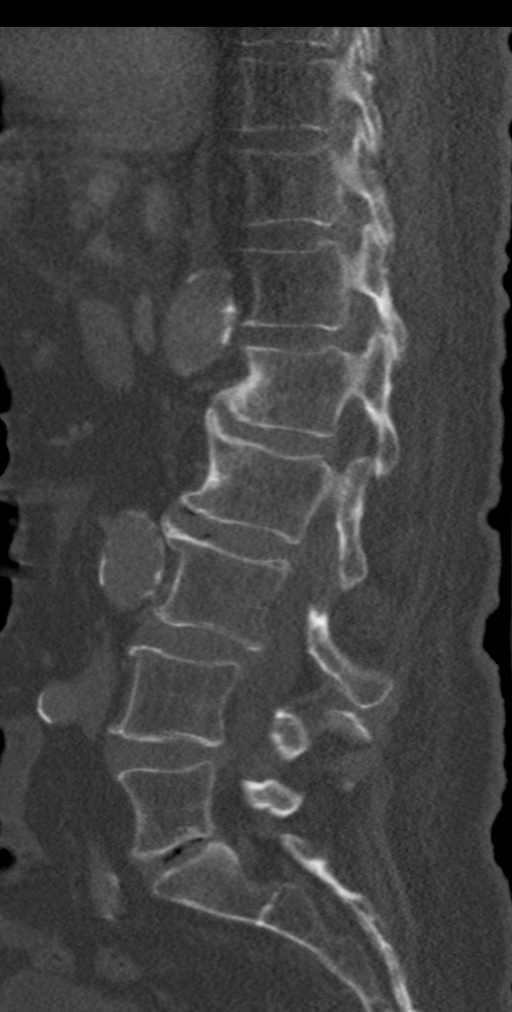
[im 43/64  bone]
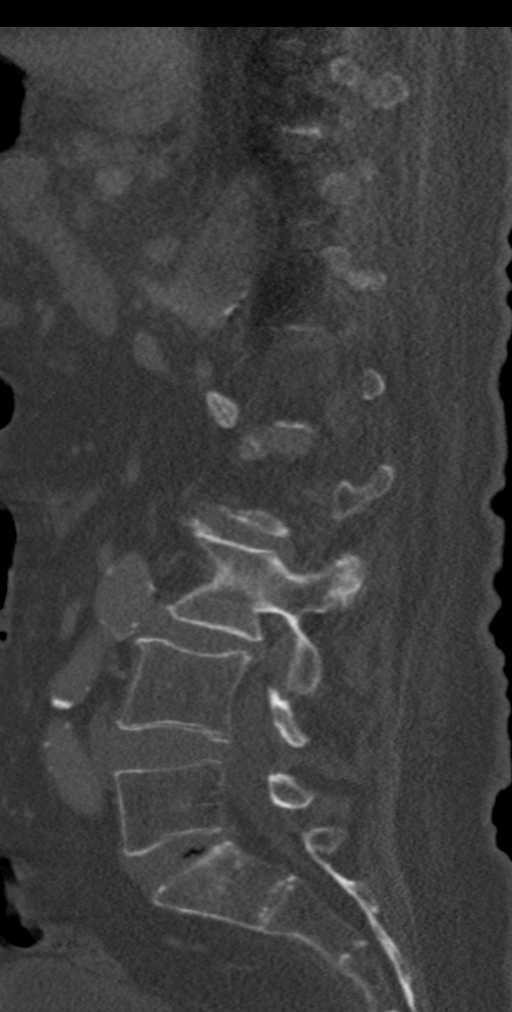

[9 of 27 positions shown; findings below may reference images not displayed]

FINDINGS: Segmentation: 5 lumbar type vertebrae.

Alignment: There is mild lumbar dextroscoliosis apex at L2 and again
noted minimal grade 1 retrolisthesis at L3-4. No new or worsening
alignment abnormality is seen.

Vertebrae: There is osteopenia. No acute spinal compression fracture
or destructive lumbar bone lesion is seen. There is mild chronic
wedging of the L1 vertebral body. There is moderate marginal
osteophytosis at the upper 3 lumbar levels, less prominent marginal
osteophytes at the lower 2 levels.

Paraspinal and other soft tissues: There is aortoiliac
atherosclerosis and abdominal aortic tortuosity without AAA. No
paraspinal mass is seen. A small left renal cyst is again noted.
There is no left renal calculus or hydronephrosis with the right
kidney not included in the imaging.

Disc levels:

T11-12 and T12-L1: Not well seen due to artifact from the patient's
arms in the field but grossly unremarkable.

L1-2: There is a normal disc height. No bulge, herniation or
stenosis is seen.

L2-3: No bulge, herniation or stenosis.

L3-4: Stable grade 1 retrolisthesis. There is preservation of the
normal disc height. Due to a posterior disc bulge and ligamentous
thickening there is mild spinal canal and lateral recess stenosis.
Due to facet spurring there is mild-to-moderate right foraminal
stenosis. The left foramen is clear.

L4-5: There is preservation of the normal disc height. Broad
posterior disc bulge causes slight spinal canal AP stenosis and
lateral recess stenosis. Right greater than left facet hypertrophy
is present and there is moderate right, mild left foraminal
stenosis.

L5-S1: There is slight disc space loss with vacuum phenomenon. There
is a nonstenosing posterior disc bulge without herniation. Right
greater than left facet hypertrophy again noted with moderate to
severe right, mild left foraminal stenosis.

Other: Expansile destructive lesion of the posterior right iliac
wing abutting the SI joint interface is again shown. It is not fully
imaged but the visualized portion is larger than previously. A
pathologic hairline fracture of the anterior aspect of the lesion is
present on the lowest slices. Maximum measured AP and transverse
axis on the current exam are 4.2 x 6.4 cm, on prior scan [DATE] x
cm.
IMPRESSION: 1. 4.2 x 6.4 cm destructive expansile lesion of the posterior right
iliac wing is slightly larger than on the [DATE] CT. There is
pathologic fracture through the anterior cortex.
2. MRI of [DATE] demonstrated 2 small foci of signal abnormality
in the L2 and 3 vertebral bodies, but these are not visible on
noncontrast CT.
3. Osteopenia, scoliosis and degenerative changes described above,
stable.
4. Aortic atherosclerosis.

## 2021-11-19 IMAGING — DX DG CHEST 1V PORT
1 series · 1 of 1 positions shown · non-contrast
Comparison: None.

CLINICAL DATA: Encephalopathy

EXAM:
PORTABLE CHEST 1 VIEW

[chest ap]
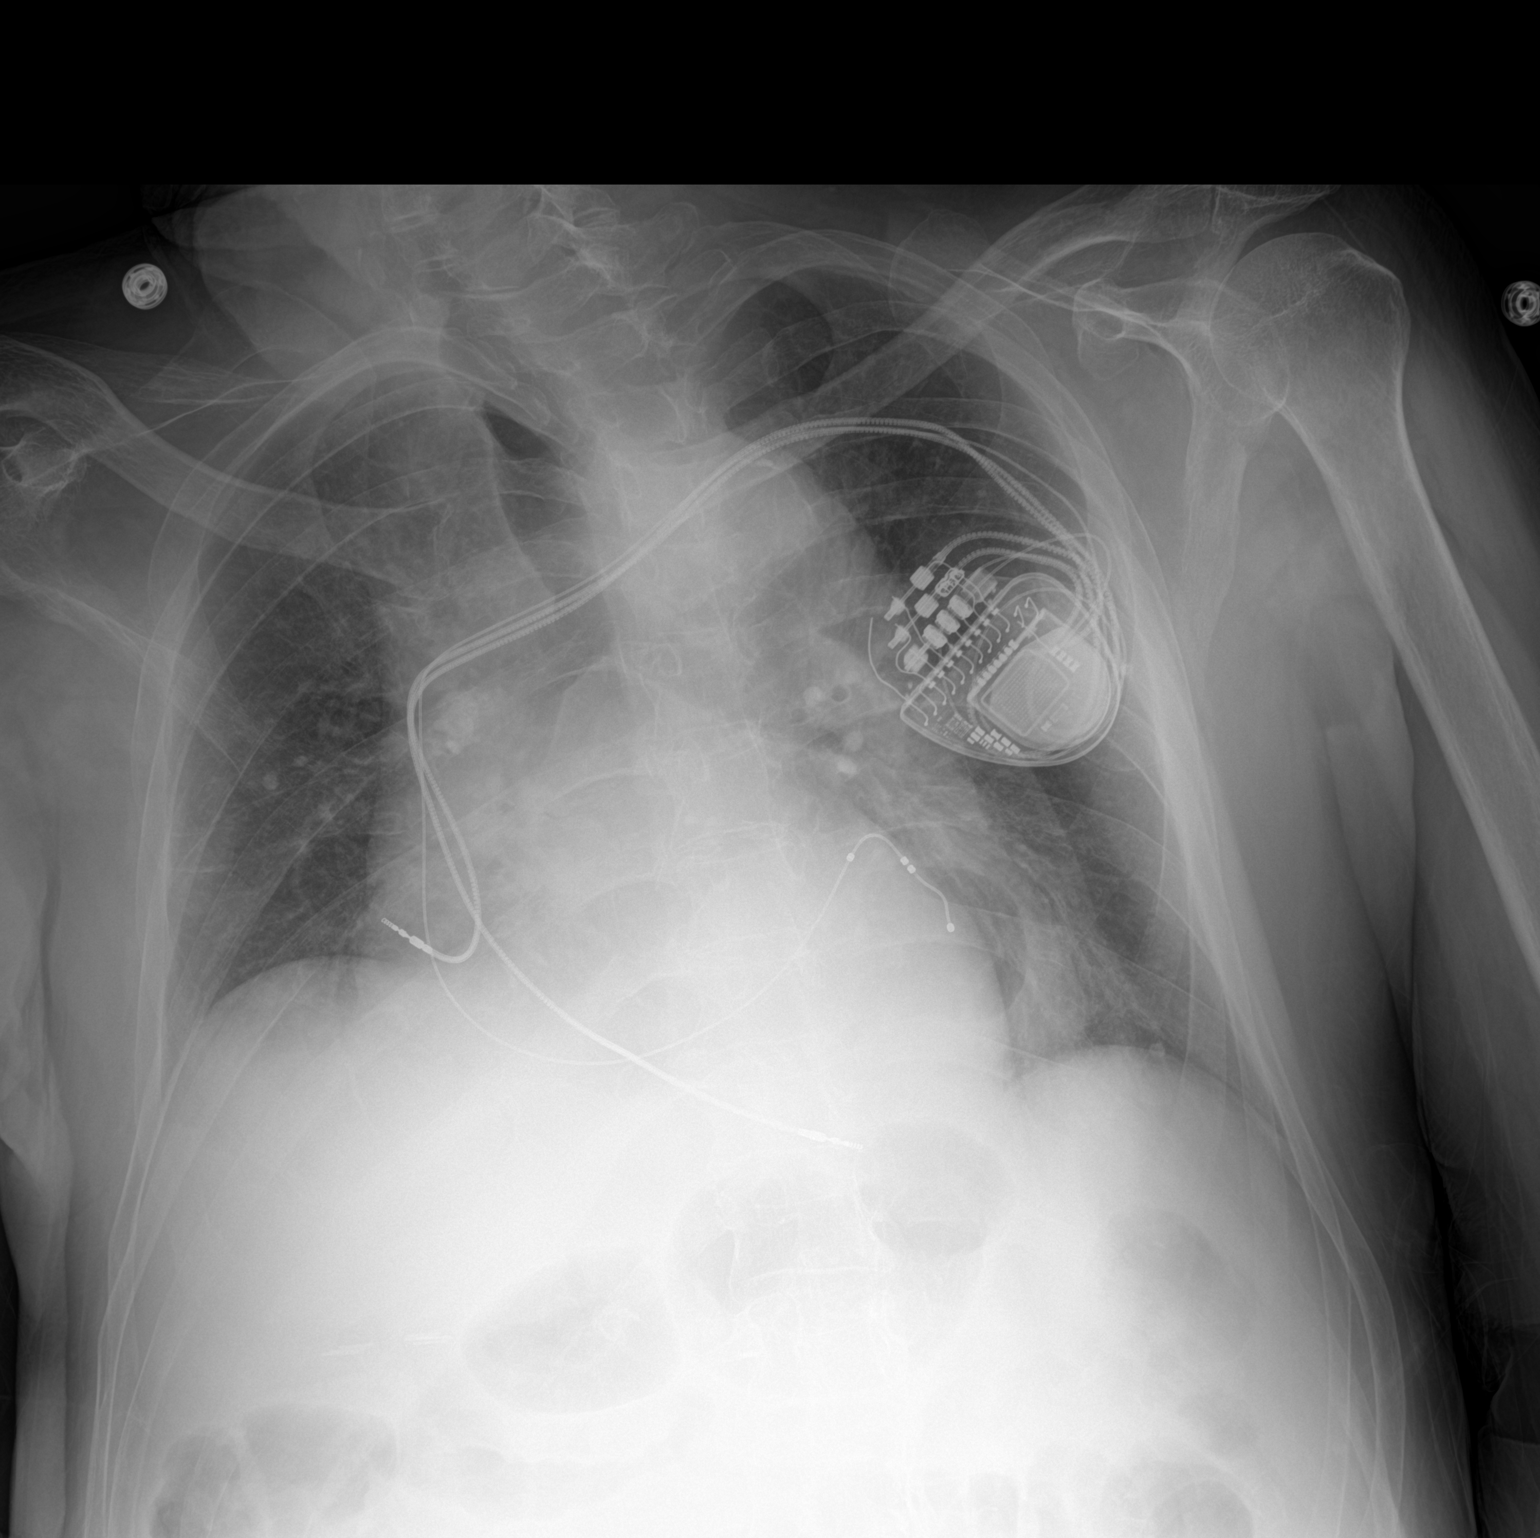

[1 of 1 positions shown; findings below may reference images not displayed]

FINDINGS: The heart size and mediastinal contours are within normal limits.
Both lungs are clear. The visualized skeletal structures are
unremarkable.
IMPRESSION: No active disease.

## 2021-11-19 IMAGING — DX DG FOOT COMPLETE 3+V*R*
3 series · 3 of 3 positions shown · non-contrast
Comparison: None.

CLINICAL DATA: Recent fall

EXAM:
RIGHT FOOT COMPLETE - 3+ VIEW

[foot ap]
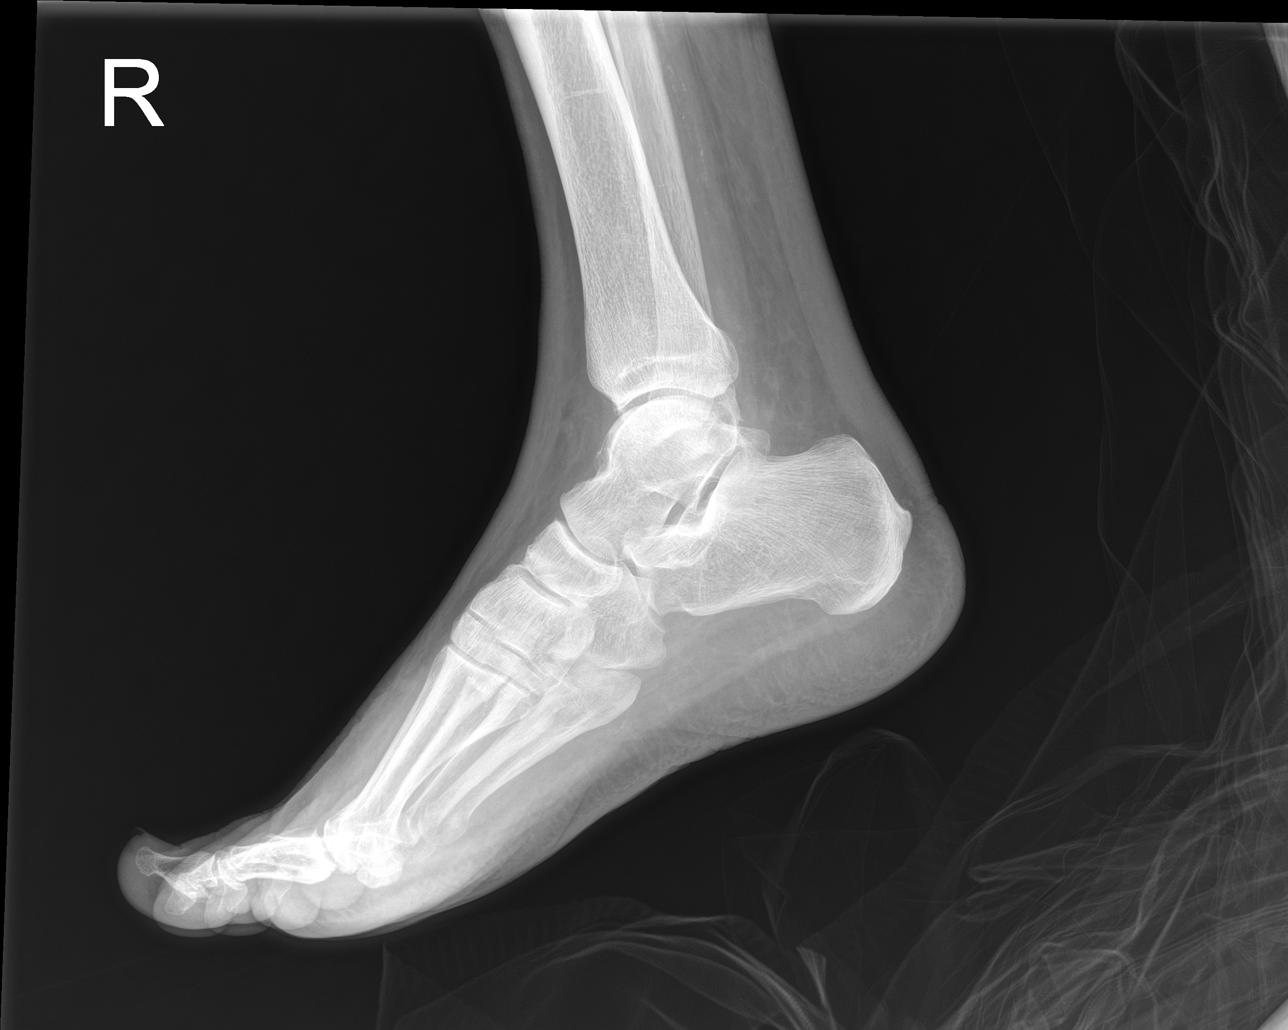

[foot obl]
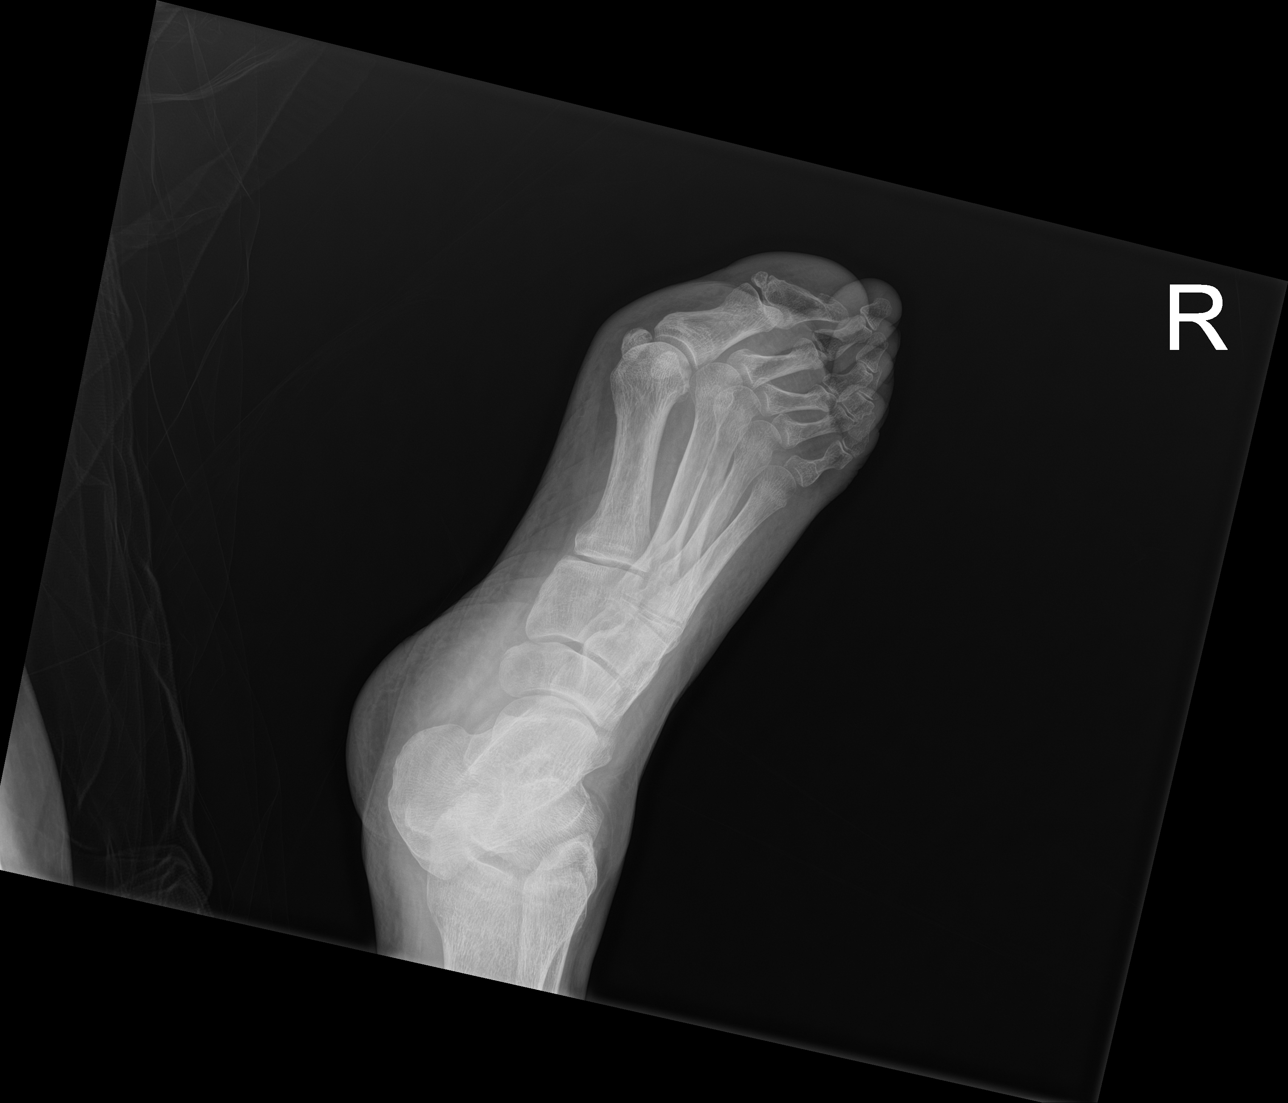

[foot lat]
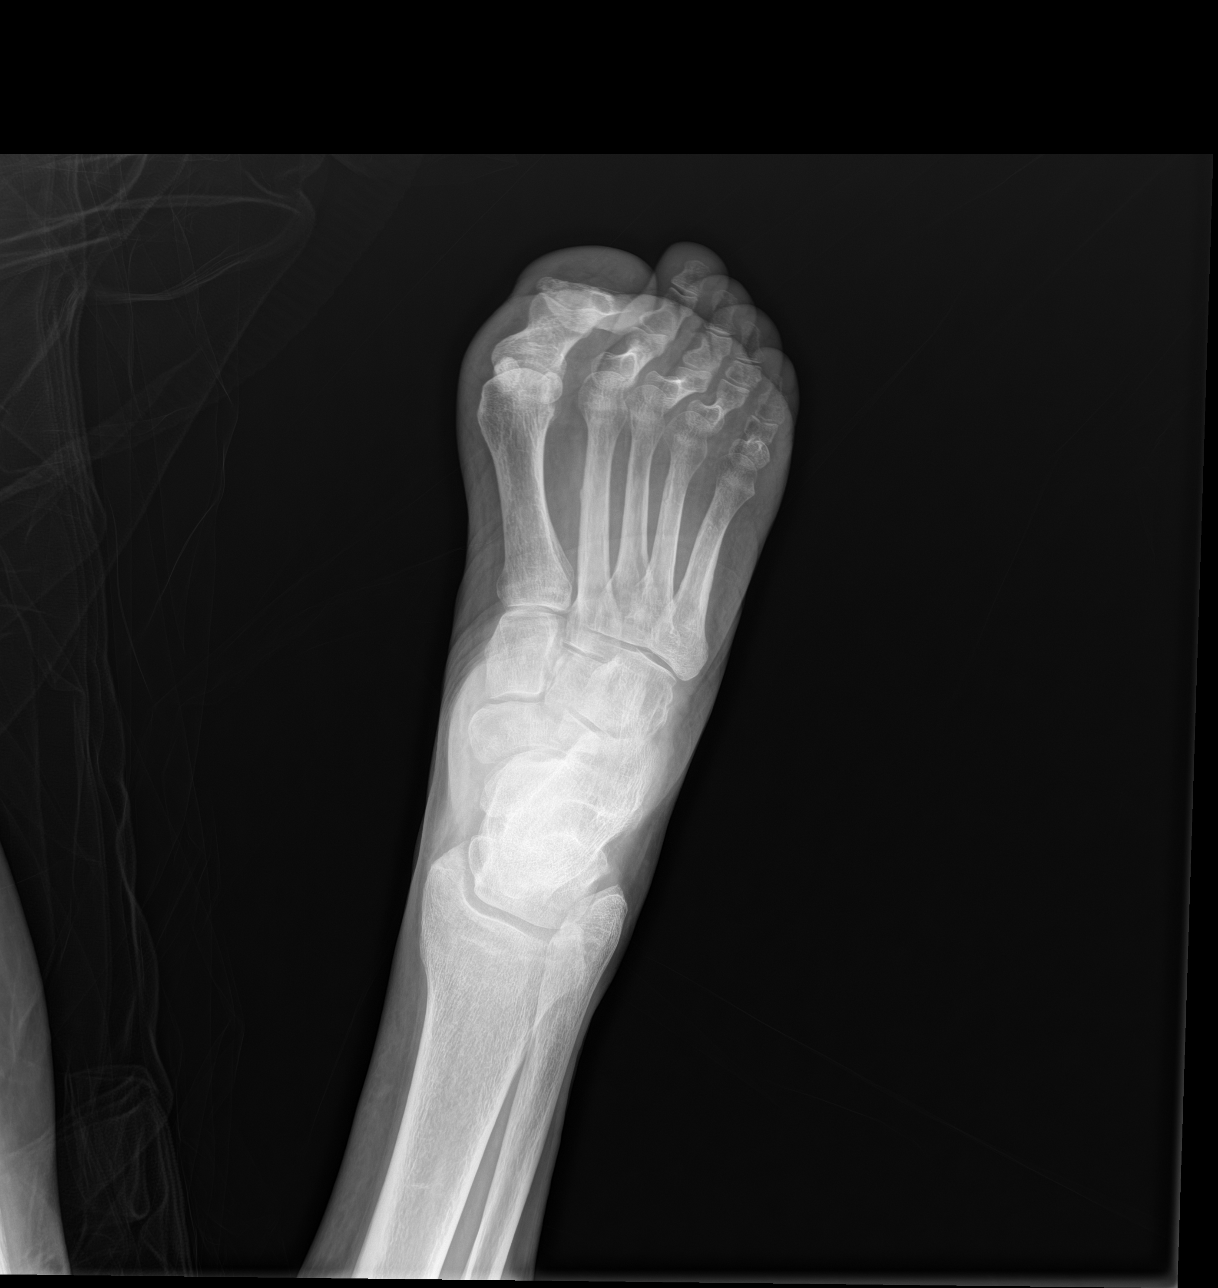

[3 of 3 positions shown; findings below may reference images not displayed]

FINDINGS: There is no evidence of fracture or dislocation. There is no
evidence of arthropathy or other focal bone abnormality. Soft
tissues are unremarkable.
IMPRESSION: Negative.

## 2021-11-19 IMAGING — CT CT HEAD WO/W CM
3 of 4 series · 15 of 47 positions shown, 18 images · IV contrast (omnipaque)
Comparison: Brain MRI [DATE]

CLINICAL DATA: Metastatic disease evaluation

EXAM:
CT HEAD WITHOUT AND WITH CONTRAST
TECHNIQUE: Contiguous axial images were obtained from the base of the skull
through the vertex without and with intravenous contrast
CONTRAST:  100mL OMNIPAQUE IOHEXOL 350 MG/ML SOLN

[Series 1: head wo · axial · 0.49mm/px · z∈[-152,-12]mm · 9 of 34 slices shown, 12 images]
[im 3/34  brain]
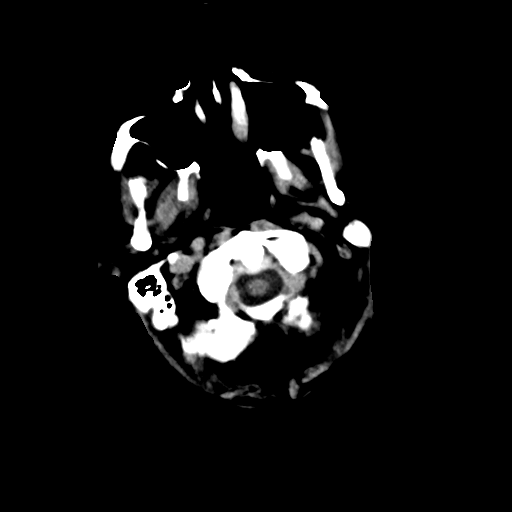
[im 3/34  bone]
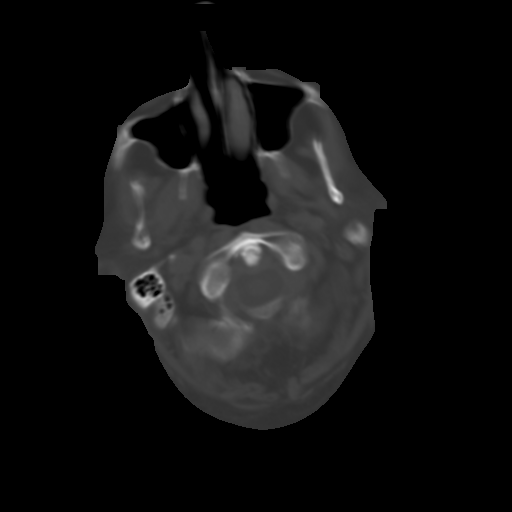
[im 8/34  brain]
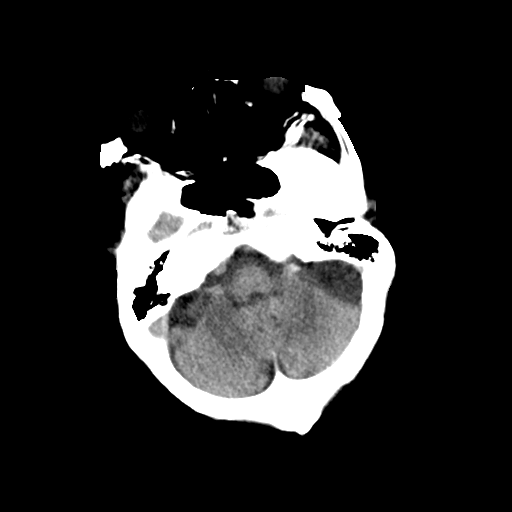
[im 10/34  brain]
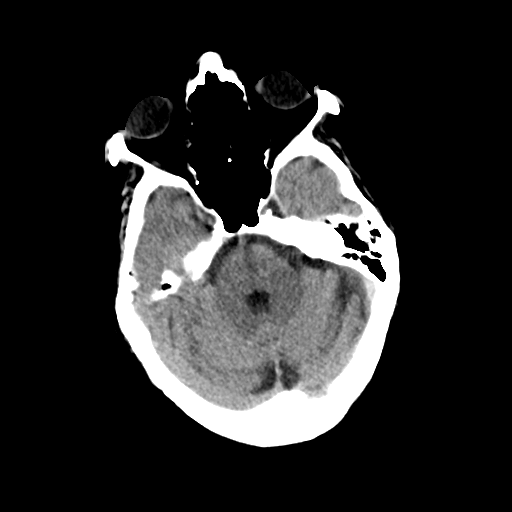
[im 15/34  brain]
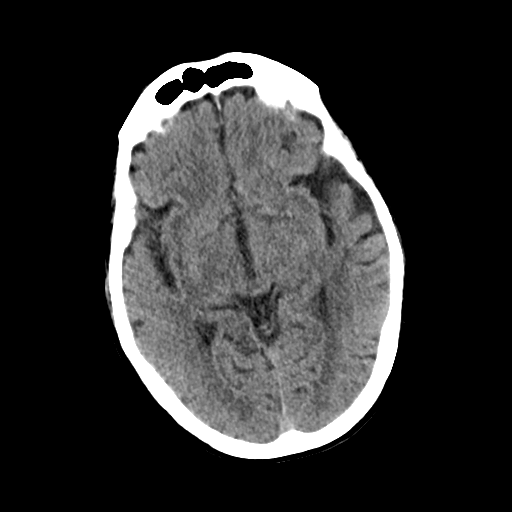
[im 17/34  brain]
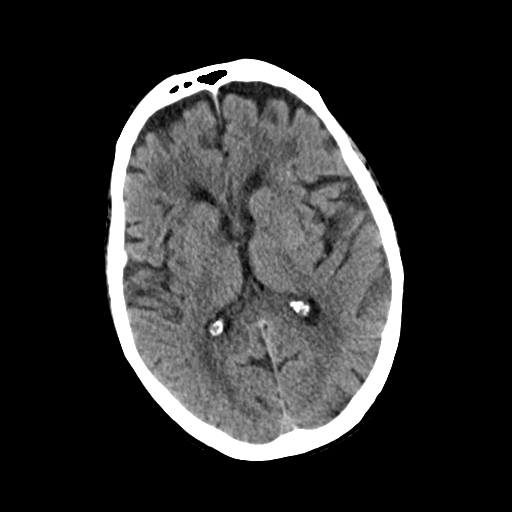
[im 17/34  bone]
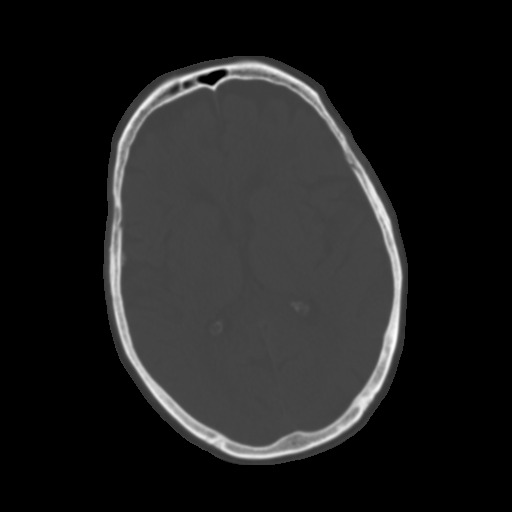
[im 19/34  brain]
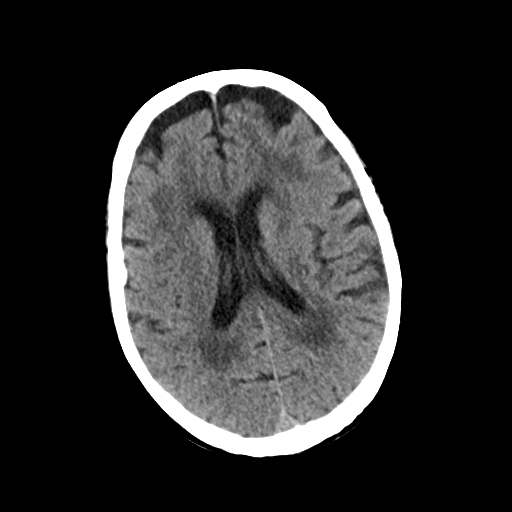
[im 24/34  brain]
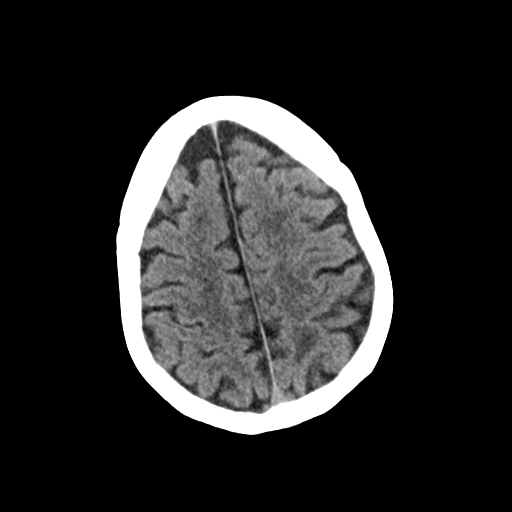
[im 26/34  brain]
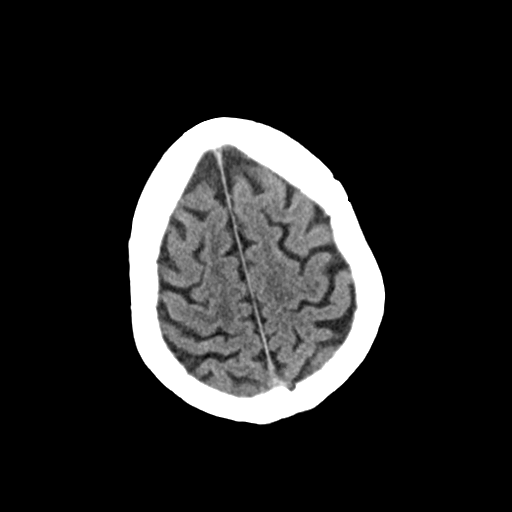
[im 31/34  brain]
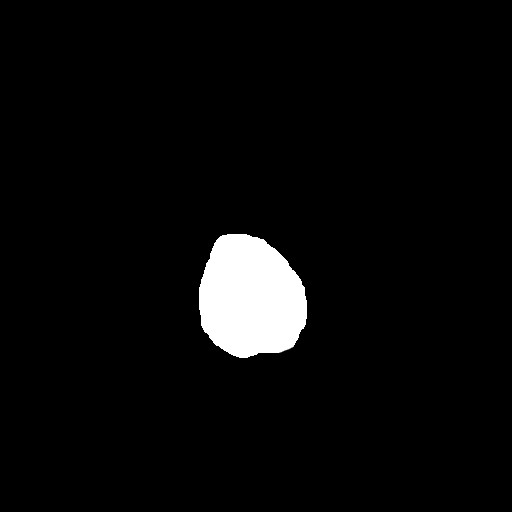
[im 31/34  bone]
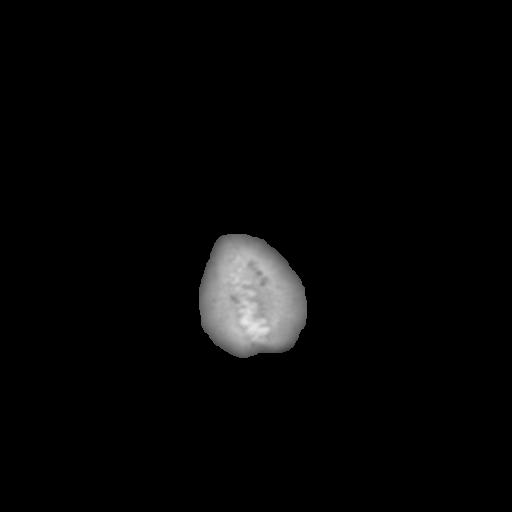

[Series 6: coronal soft tissue · coronal · 0.33mm/px · 3 of 77 slices shown]
[im 26/77  brain]
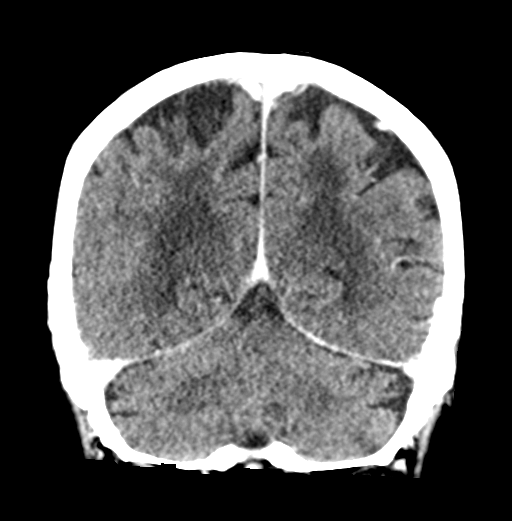
[im 34/77  brain]
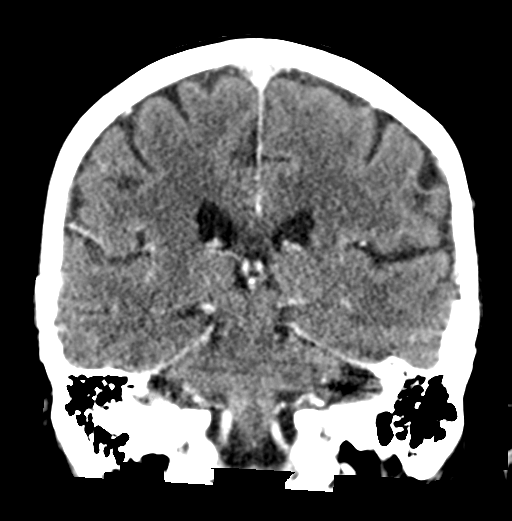
[im 43/77  brain]
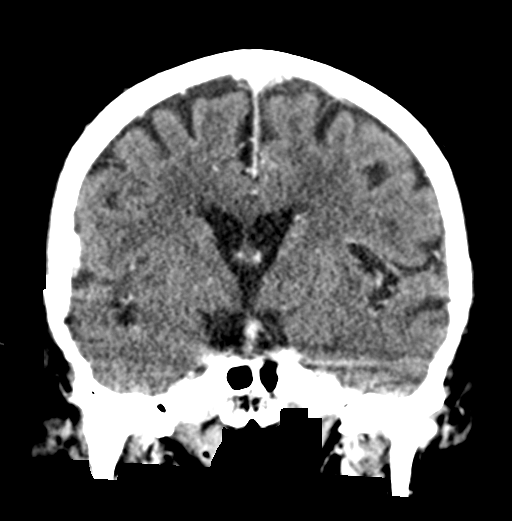

[Series 7: sagittal soft tissue · sagittal · 0.34mm/px · 3 of 57 slices shown]
[im 19/57  brain]
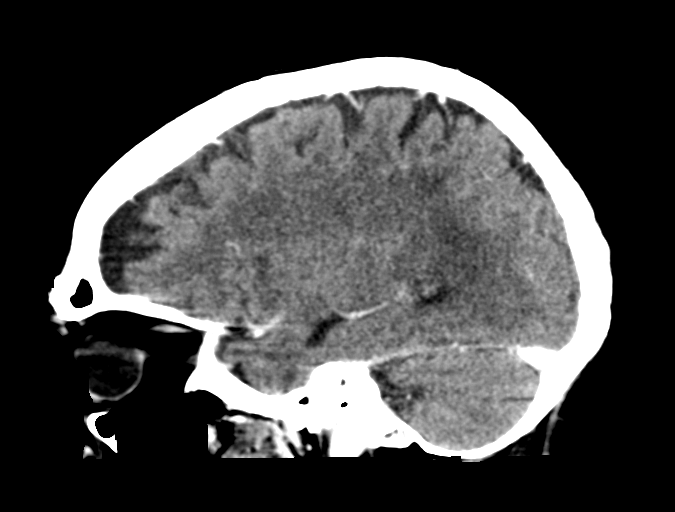
[im 29/57  brain]
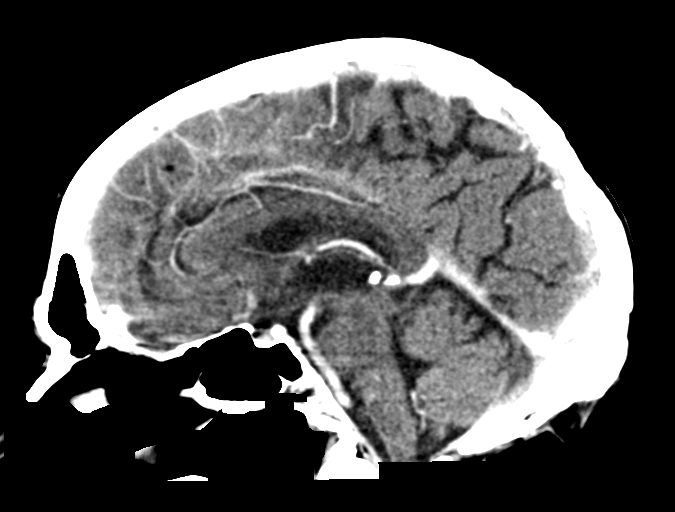
[im 38/57  brain]
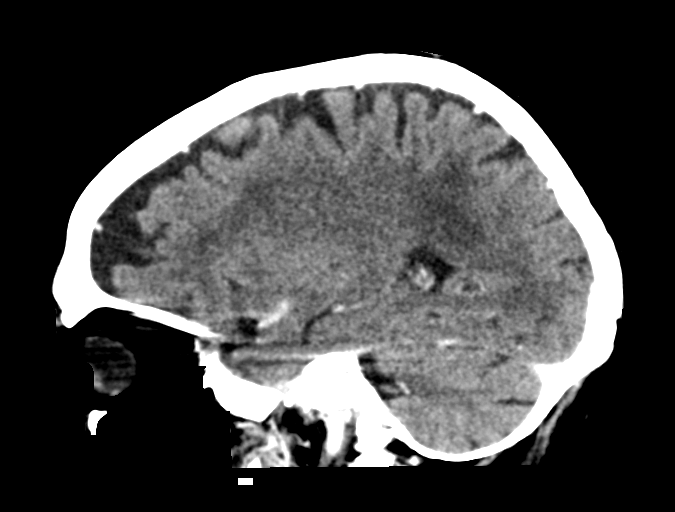

[15 of 47 positions shown; findings below may reference images not displayed]

FINDINGS: Brain: Unchanged 9 mm meningioma of the right middle cranial fossa.
The enhancing metastases shown on the brain MRI of [DATE] are
not visible on this study. There is generalized atrophy without
lobar predilection. Hypodensity of the white matter is most commonly
associated with chronic microvascular disease.

Vascular: No abnormal hyperdensity of the major intracranial
arteries or dural venous sinuses. No intracranial atherosclerosis.

Skull: The visualized skull base, calvarium and extracranial soft
tissues are normal.

Sinuses/Orbits: No fluid levels or advanced mucosal thickening of
the visualized paranasal sinuses. No mastoid or middle ear effusion.
The orbits are normal.
IMPRESSION: Unchanged 9 mm right middle cranial fossa meningioma. The enhancing
metastases shown on the brain MRI of [DATE] are not visible on
this study.

## 2021-11-19 MED ORDER — ACETAMINOPHEN 650 MG RE SUPP: 650.0000 mg | Freq: Four times a day (QID) | RECTAL | Status: DC | PRN

## 2021-11-19 MED ORDER — LEVETIRACETAM 500 MG PO TABS
500.0000 mg | ORAL_TABLET | Freq: Two times a day (BID) | ORAL | Status: DC
  Administered 2021-11-19 – 2021-11-25 (×11): 500 mg via ORAL
  Filled 2021-11-19 (×11): qty 1

## 2021-11-19 MED ORDER — APIXABAN 5 MG PO TABS
5.0000 mg | ORAL_TABLET | Freq: Two times a day (BID) | ORAL | Status: DC
  Administered 2021-11-19 – 2021-11-25 (×11): 5 mg via ORAL
  Filled 2021-11-19 (×11): qty 1

## 2021-11-19 MED ORDER — HALOPERIDOL LACTATE 5 MG/ML IJ SOLN: 0.5000 mg | Freq: Four times a day (QID) | INTRAMUSCULAR | Status: DC | PRN

## 2021-11-19 MED ORDER — LORAZEPAM 2 MG/ML IJ SOLN
1.0000 mg | Freq: Four times a day (QID) | INTRAMUSCULAR | Status: DC | PRN
  Administered 2021-11-21: 01:00:00 1 mg via INTRAVENOUS
  Filled 2021-11-19 (×2): qty 1

## 2021-11-19 MED ORDER — CARBIDOPA-LEVODOPA 25-250 MG PO TABS
2.0000 | ORAL_TABLET | Freq: Every evening | ORAL | Status: DC
  Administered 2021-11-19 – 2021-11-21 (×3): 2 via ORAL
  Filled 2021-11-19 (×3): qty 2

## 2021-11-19 MED ORDER — LACTATED RINGERS IV SOLN: INTRAVENOUS | Status: DC

## 2021-11-19 MED ORDER — ENOXAPARIN SODIUM 40 MG/0.4ML IJ SOSY: 40.0000 mg | PREFILLED_SYRINGE | INTRAMUSCULAR | Status: DC

## 2021-11-19 MED ORDER — LEVETIRACETAM 250 MG PO TABS: 250.0000 mg | ORAL_TABLET | Freq: Two times a day (BID) | ORAL | Status: DC

## 2021-11-19 MED ORDER — CARBIDOPA-LEVODOPA 25-250 MG PO TABS
1.0000 | ORAL_TABLET | Freq: Every day | ORAL | Status: DC
  Administered 2021-11-20 – 2021-11-22 (×3): 1 via ORAL
  Filled 2021-11-19 (×3): qty 1

## 2021-11-19 MED ORDER — ONDANSETRON HCL 4 MG PO TABS: 4.0000 mg | ORAL_TABLET | Freq: Four times a day (QID) | ORAL | Status: DC | PRN

## 2021-11-19 MED ORDER — DEXAMETHASONE SODIUM PHOSPHATE 10 MG/ML IJ SOLN: 10.0000 mg | Freq: Once | INTRAMUSCULAR | Status: DC

## 2021-11-19 MED ORDER — DIGOXIN 125 MCG PO TABS
125.0000 ug | ORAL_TABLET | ORAL | Status: DC
  Administered 2021-11-20 – 2021-11-23 (×3): 125 ug via ORAL
  Filled 2021-11-19 (×3): qty 1

## 2021-11-19 MED ORDER — LACTATED RINGERS IV BOLUS: 1000.0000 mL | Freq: Once | INTRAVENOUS | Status: DC

## 2021-11-19 MED ORDER — DEXTROSE IN LACTATED RINGERS 5 % IV SOLN: INTRAVENOUS | Status: DC

## 2021-11-19 MED ORDER — THIAMINE HCL 100 MG/ML IJ SOLN
100.0000 mg | Freq: Every day | INTRAMUSCULAR | Status: DC
Start: 2021-11-19 — End: 2021-11-24
  Administered 2021-11-19 – 2021-11-24 (×6): 100 mg via INTRAVENOUS
  Filled 2021-11-19 (×6): qty 2

## 2021-11-19 MED ORDER — ONDANSETRON HCL 4 MG/2ML IJ SOLN: 4.0000 mg | Freq: Four times a day (QID) | INTRAMUSCULAR | Status: DC | PRN

## 2021-11-19 MED ORDER — CARBIDOPA-LEVODOPA 25-250 MG PO TABS
2.0000 | ORAL_TABLET | Freq: Two times a day (BID) | ORAL | Status: DC
  Filled 2021-11-19: qty 2

## 2021-11-19 MED ORDER — IOHEXOL 350 MG/ML SOLN
100.0000 mL | Freq: Once | INTRAVENOUS | Status: AC | PRN
  Administered 2021-11-19: 100 mL via INTRAVENOUS

## 2021-11-19 MED ORDER — LACTATED RINGERS IV BOLUS
500.0000 mL | Freq: Once | INTRAVENOUS | Status: AC
  Administered 2021-11-19: 500 mL via INTRAVENOUS

## 2021-11-19 MED ORDER — ACETAMINOPHEN 325 MG PO TABS
650.0000 mg | ORAL_TABLET | Freq: Four times a day (QID) | ORAL | Status: DC | PRN
  Administered 2021-11-22: 650 mg via ORAL
  Filled 2021-11-19 (×2): qty 2

## 2021-11-19 MED ORDER — ACETAMINOPHEN 325 MG PO TABS
650.0000 mg | ORAL_TABLET | Freq: Four times a day (QID) | ORAL | Status: DC | PRN
  Administered 2021-11-19 – 2021-11-21 (×3): 650 mg via ORAL
  Filled 2021-11-19 (×2): qty 2

## 2021-11-19 NOTE — Assessment & Plan Note (Signed)
Hasn't walked in well over Marcus Fuller month MRI 8/17 with expansile lesion in R iliac bone concerning for malignancy, 2 small foci of signal abnormality in L2 and L3 vertebral bodies concerning for additional small mets Follow CT L spine with/without contrast Consider MRI, though with pacemaker, this would likely [facility]

## 2021-11-19 NOTE — Assessment & Plan Note (Signed)
noted 

## 2021-11-19 NOTE — Assessment & Plan Note (Signed)
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 Irene Limbo

## 2021-11-19 NOTE — Assessment & Plan Note (Signed)
MRI 09/25/21 with 3 unchanged mets in right frontal lobe Will follow CT head with/without contrast Follows with Dr. Irene Limbo and Dr. Mickeal Skinner, unable to reach them today to discuss care, will reach out again tomorrow Concern for complex partial seizures in 11/09/21 note from neuro oncology Continue keppra 500 mg BID (follow dose, med rec pending - different from Vaslow note and meds in chart) EEG as above

## 2021-11-19 NOTE — Assessment & Plan Note (Signed)
Plain films

## 2021-11-19 NOTE — Assessment & Plan Note (Signed)
Broad ddx at this point, infectious vs metastatic cancer vs seizure vs medication, etc CXR, UA and culture Head CT with/without contrast EEG Ammonia, TSH B12, folate Check digoxin level  Supplement thiamine Hypotensive in office, SBP to 80's, but seems to have improved

## 2021-11-19 NOTE — Assessment & Plan Note (Signed)
Noted, will likely need to [facility] any [facility]

## 2021-11-19 NOTE — Assessment & Plan Note (Addendum)
Sinemet, dose increased per vaslow Will resume dose prescribed for Dr. Jannifer Franklin and follow

## 2021-11-19 NOTE — Assessment & Plan Note (Signed)
Digoxin, eliquis Follow dig level

## 2021-11-19 NOTE — Assessment & Plan Note (Signed)
Appears euvolemic Echo 2019 with EF 50-55%

## 2021-11-19 NOTE — H&P (Addendum)
History and Physical    Marcus Fuller ZDG:[id number] DOB: 1941/05/08 DOA: 11/19/2021  PCP: Cari Caraway, MD   Chief Complaint: Marcus Fuller  HPI: Marcus Fuller is Marcus Fuller 80 y.o. 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  History is obtained with discussion from family and chart review.  Limited due to his AMS.  He was presenting to the cancer center for labs and palliative care consult.  After labs were drawn, he was increasingly confused.  Didn't know where her was or who daughter was.  He was extremely anxious, almost like Marcus Fuller panic attack. He was breathing fast.  His wife notes he's had intermittent confusion recently related to cancer, but that things have gotten worse in the past few days.  He didn't sleep much last night.  He's impulsive and thinks he can stand, but hasn't been able to for over 1 month due to weakness.  He's declined it seems to the family since he's begun taking the tagrisso.  They stopped this about 5 days ago after family discussion/discussion with Dr. Irene Fuller, but haven't noticed any difference.  Due to his confusion in the office, he was referred to the hospital for direct admission.  Review of Systems: Review of Systems  Constitutional:  Positive for chills (always cold). Negative for fever.  Respiratory:  Negative for shortness of breath.   Cardiovascular:  Negative for chest pain.  Gastrointestinal:  Negative for abdominal pain.   As per HPI otherwise 10 point review of systems negative.   Allergies  Allergen Reactions   Atorvastatin Other (See Comments)    Joint pain   Codeine Itching    Past Medical History:  Diagnosis Date   Anxiety    Cataract    beginning stages   Chronic low back pain 07/03/2019   Coronary artery disease    stent- 2007    History of kidney stones    Hypertension     Parkinson's disease ([place]) 01/29/2018   Presence of permanent cardiac pacemaker    2017    Tremor of right hand     Past Surgical History:  Procedure Laterality Date   APPENDECTOMY     bbb     CARDIOVERSION N/Marcus Fuller 04/19/2021   Procedure: CARDIOVERSION;  Surgeon: Marcus Perla, MD;  Location: [facility] ENDOSCOPY;  Service: Cardiovascular;  Laterality: Marcus Fuller;   CHOLECYSTECTOMY N/Marcus Fuller 05/21/2018   Procedure: LAPAROSCOPIC CHOLECYSTECTOMY;  Surgeon: Marcus Keens, MD;  Location: [facility] ORS;  Service: General;  Laterality: N/Marcus Fuller;   left knee arthroscopy   1996   PACEMAKER INSERTION  11/2016   STENT PLACEMENT VASCULAR ([place] [facility])  2007   TONSILLECTOMY       reports that he has never smoked. He has never used smokeless tobacco. He reports that he does not currently use alcohol. He reports that he does not use drugs.  Family History  Problem Relation Age of Onset   Heart disease Mother    Diabetes Mother    Cancer Mother        breast   Heart disease Father    Heart attack Father    Atrial fibrillation Sister    Heart disease Brother    Pancreatic disease Brother    Tremor Brother    Heart attack Paternal Grandfather    Spina bifida Brother     Prior to Admission medications   Medication Sig Start Date End  Date Taking? Authorizing Provider  acetaminophen (TYLENOL) 500 MG tablet Take 500-1,000 mg by mouth every 6 (six) hours as needed (for pain).   Yes [provider]  carbidopa-levodopa (SINEMET) 25-250 MG tablet Take 1 tablet by mouth 3 (three) times daily. Patient taking differently: Take 2 tablets by mouth See admin instructions. Take 2 tablets by mouth at 9 AM and 9 PM 03/04/21  Yes Marcus Ducking, MD  digoxin (LANOXIN) 0.125 MG tablet Take 1 tablet (125 mcg total) by mouth 4 (four) times Marcus Fuller week. Take Tuesday, Thursday, Saturday and Sunday Patient taking differently: Take 125 mcg by mouth See admin instructions. Take 125 mcg by mouth once Marcus Fuller day on Sun/Tues/Thurs/Sat 10/05/21  Yes  Fuller, Mihai, MD  ELIQUIS 5 MG TABS tablet TAKE 1 TABLET BY MOUTH TWICE Marcus Fuller DAY Patient taking differently: Take 5 mg by mouth in the morning and at bedtime. 10/27/21  Yes Fuller, Mihai, MD  HYDROmorphone (DILAUDID) 2 MG tablet Take 1 tablet (2 mg total) by mouth every 4 (four) hours as needed for severe pain. 10/11/21  Yes Fuller, Marcus Sax, NP  levETIRAcetam (KEPPRA) 250 MG tablet Take 1 tablet (250 mg total) by mouth 2 (two) times daily. Patient taking differently: Take 250 mg by mouth in the morning and at bedtime. 11/09/21  Yes Marcus Fuller, Marcus Lav, MD  Melatonin 10 MG TABS Take 15 mg by mouth at bedtime.   Yes [provider]  triamcinolone 0.1% oint-Eucerin equivalent cream 1:1 mixture Apply topically 2 (two) times daily. Patient taking differently: Apply 1 application topically 2 (two) times daily. Apply to affected area(s) 2 times [place] day 11/09/21  Yes Fuller, Marcus Sax, NP  gabapentin (NEURONTIN) 600 MG tablet 1/2 tablet in the morning and midday, 1 at night Patient not taking: Reported on 11/19/2021 08/04/21   Marcus Ducking, MD  osimertinib mesylate (TAGRISSO) 80 MG tablet Take 1 tablet (80 mg total) by mouth daily. Patient not taking: Reported on 11/19/2021 09/16/21   Marcus Genera, MD    Physical Exam: Vitals:   11/19/21 1718  BP: 105/71  Pulse: 97  Resp: 16  Temp: 97.7 F (36.5 C)  TempSrc: Oral  SpO2: 97%   Physical Exam Constitutional:      Appearance: Normal appearance.  HENT:     Head: Normocephalic and atraumatic.  Eyes:     Extraocular Movements: Extraocular movements intact.  Cardiovascular:     Rate and Rhythm: Normal rate and regular rhythm.     Pulses: Normal pulses.  Pulmonary:     Effort: Pulmonary effort is normal.     Breath sounds: Normal breath sounds.  Abdominal:     General: Abdomen is flat. There is no distension.     Palpations: Abdomen is soft.     Tenderness: There is no abdominal tenderness.   Musculoskeletal:     Cervical back: Normal range of motion and neck supple.  Skin:    Comments: Abrasion to back, nontender  Neurological:     Mental Status: He is alert.     Comments: Lethargic, but oriented - bilateral lower extremities weakness, denies sensory deficit   Labs on Admission: I have personally reviewed the patients's labs and imaging studies.  Assessment/Plan Principal Problem:   Acute metabolic encephalopathy Active Problems:   Brain metastases (HCC)   Metastatic adenocarcinoma (HCC)   Lower extremity weakness   Parkinson disease (HCC)   Pacemaker   Coronary artery disease   (HFpEF) heart failure with preserved ejection fraction ([place])  AF (paroxysmal atrial fibrillation) (HCC)   Toe trauma   * Acute metabolic encephalopathy Broad ddx at this point, infectious vs metastatic cancer vs seizure vs medication, etc CXR, UA and culture Head CT with/without contrast EEG Ammonia, TSH B12, folate Check digoxin level  Supplement thiamine Hypotensive in office, SBP to 80's, but seems to have improved   Brain metastases ([place]) MRI 09/25/21 with 3 unchanged mets in right frontal lobe Will follow CT head with/without contrast Follows with Dr. Irene Fuller and Dr. 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 Irene Fuller  Lower extremity weakness Hasn't walked in well over [place] month MRI 8/17 with expansile lesion in R iliac bone concerning for malignancy, 2 small  foci of signal abnormality in L2 and L3 vertebral bodies concerning for additional small mets Follow CT L spine with/without contrast Consider MRI, though with pacemaker, this would [facility]  (HFpEF) heart failure with preserved ejection fraction ([place]) Appears euvolemic Echo 2019 with EF 50-55%  Coronary artery disease noted  Pacemaker Noted, will likely [facility] to [facility]  Parkinson disease ([place]) Sinemet, dose increased per Marcus Fuller Will resume dose prescribed for Dr. Jannifer Franklin and follow  AF (paroxysmal atrial fibrillation) ([place]) Digoxin, eliquis Follow dig level  Toe trauma Plain films  Admission status: Observation Med-Surg  Certification: The appropriate patient status for this patient is OBSERVATION. Observation status is judged to be reasonable and necessary in order to provide the required intensity of service to ensure the patient's safety. The patient's presenting symptoms, physical exam findings, and initial radiographic and laboratory data in the context of their medical condition is felt to place them at decreased risk for further clinical deterioration. Furthermore, it is anticipated that the patient will be medically stable for discharge from the hospital within 2 midnights of admission.     Fayrene Helper MD [facility] If 7PM-7AM, please contact night-coverage [email or web address]  11/19/2021, 7:19 PM

## 2021-11-19 NOTE — Progress Notes (Signed)
[place]  [phone]) [phone] [phone]) [phone]   Name: Marcus Fuller Date: 11/19/2021 MRN: [id number]  DOB: 08-06-1941  Patient Care Team: Cari Caraway, MD as PCP - General (Family Medicine) Sanda Klein, MD as PCP - Cardiology (Cardiology) Jola Baptist, [place] as Referring Physician (Chiropractic Medicine) Pickenpack-Cousar, Carlena Sax, NP as Nurse Practitioner (Nurse Practitioner)      Marcus Fuller is a 80 y.o. male with multiple medical problems including hypertension, Parkinson's disease, CAD s/p PCI (2007), pacemaker, chronic low back pain, and atrial fibrillation (Digoxin and Eliquis). Patient with newly diagnosed metastatic lung adenocarcinoma with frontal lobe brain mets and bone lesions. Palliative ask to see for symptom management and goals of care.    SOCIAL HISTORY:     reports that he has never smoked. He has never used smokeless tobacco. He reports that he does not currently use alcohol. He reports that he does not use drugs.  ADVANCE DIRECTIVES:  Patient does not have a completed advanced directive. I discussed at length with his family. AD packet was provided during last visit however patient has been unable to complete and sign. Wife is hopeful patient will be able to complete documents however they have discussed his wishes. MOST form introduced. Wife and children request DNR/DNI as patient has expressed previously.    CODE STATUS: DNR  PAST MEDICAL HISTORY: Past Medical History:  Diagnosis Date   Anxiety    Cataract    beginning stages   Chronic low back pain 07/03/2019   Coronary artery disease    stent- 2007    History of kidney stones    Hypertension    Parkinson's disease ([place]) 01/29/2018   Presence of permanent cardiac pacemaker    2017    Tremor of right hand      HEMATOLOGY/ONCOLOGY HISTORY:  Oncology History  Primary adenocarcinoma of upper lobe of right lung ([place])  09/08/2021 Initial  Diagnosis   Primary adenocarcinoma of upper lobe of right lung ([place])   09/22/2021 Cancer Staging   Staging form: Lung, AJCC 8th Edition - Clinical: Stage IVB (cT1b, cNX, cM1c) - Signed by Brunetta Genera, MD on 09/22/2021 Histologic grade (G): GX Histologic grading system: 4 grade system      ALLERGIES:  is allergic to atorvastatin and codeine.  MEDICATIONS:  Current Outpatient Medications  Medication Sig Dispense Refill   acetaminophen (TYLENOL) 325 MG tablet Take 650 mg by mouth every 6 (six) hours as needed.     carbidopa-levodopa (SINEMET) 25-250 MG tablet Take 1 tablet by mouth 3 (three) times daily. (Patient taking differently: Take 1 tablet by mouth 3 (three) times daily. Takes 2 tablets in morning and 1 tablet in the evening to achieve 3 times daily.) 270 tablet 3   digoxin (LANOXIN) 0.125 MG tablet Take 1 tablet (125 mcg total) by mouth 4 (four) times a week. Take Tuesday, Thursday, Saturday and Sunday 16 tablet 11   ELIQUIS 5 MG TABS tablet TAKE 1 TABLET BY MOUTH TWICE A DAY 60 tablet 5   gabapentin (NEURONTIN) 600 MG tablet 1/2 tablet in the morning and midday, 1 at night (Patient taking differently: 600 mg 2 (two) times daily.) 180 tablet 3   HYDROmorphone (DILAUDID) 2 MG tablet Take 1 tablet (2 mg total) by mouth every 4 (four) hours as needed for severe pain. 30 tablet 0   levETIRAcetam (KEPPRA) 250 MG tablet Take 1 tablet (250 mg total) by mouth 2 (two) times daily. 60 tablet  3   Melatonin 10 MG TABS Take 10 mg by mouth at bedtime.     osimertinib mesylate (TAGRISSO) 80 MG tablet Take 1 tablet (80 mg total) by mouth daily. 30 tablet 1   triamcinolone 0.1% oint-Eucerin equivalent cream 1:1 mixture Apply topically 2 (two) times daily. 30 g 2   No current facility-administered medications for this visit.    VITAL SIGNS: There were no vitals taken for this visit. There were no vitals filed for this visit.  Estimated body mass index is 26.96 kg/m as calculated from the  following:   Height as of an earlier encounter on 11/19/21: 5\' 11"  (1.803 m).   Weight as of 09/16/21: 193 lb 4.8 oz (87.7 kg).  LABS: CBC:    Component Value Date/Time   WBC 5.0 11/19/2021 1008   WBC 9.4 08/19/2021 1302   HGB 13.0 11/19/2021 1008   HGB 15.5 12/28/2018 1245   HCT 40.1 11/19/2021 1008   HCT 46.3 12/28/2018 1245   PLT 163 11/19/2021 1008   PLT 165 12/28/2018 1245   MCV 89.3 11/19/2021 1008   MCV 84 12/28/2018 1245   NEUTROABS 3.6 11/19/2021 1008   NEUTROABS 6.3 12/28/2018 1245   LYMPHSABS 0.7 11/19/2021 1008   LYMPHSABS 2.1 12/28/2018 1245   MONOABS 0.4 11/19/2021 1008   EOSABS 0.1 11/19/2021 1008   EOSABS 0.2 12/28/2018 1245   BASOSABS 0.0 11/19/2021 1008   BASOSABS 0.1 12/28/2018 1245   Comprehensive Metabolic Panel:    Component Value Date/Time   NA 141 11/19/2021 1008   NA 141 07/06/2020 0949   K 3.8 11/19/2021 1008   CL 108 11/19/2021 1008   CO2 23 11/19/2021 1008   BUN 18 11/19/2021 1008   BUN 16 07/06/2020 0949   CREATININE 0.83 11/19/2021 1008   GLUCOSE 106 (H) 11/19/2021 1008   CALCIUM 8.7 (L) 11/19/2021 1008   AST 16 11/19/2021 1008   ALT 6 11/19/2021 1008   ALKPHOS 118 11/19/2021 1008   BILITOT 0.9 11/19/2021 1008   PROT 5.6 (L) 11/19/2021 1008   PROT 6.6 12/28/2018 1245   ALBUMIN 2.7 (L) 11/19/2021 1008   ALBUMIN 4.4 12/28/2018 1245     PERFORMANCE STATUS (ECOG) : 3 - Symptomatic, >50% confined to bed   Physical Exam General: confused, anxious, chronically-ill appearing  Cardiovascular: Irregular Pulmonary: diminished  Abdomen: soft, nontender, + bowel sounds Extremities: bilateral lower extremity edema L>R, toe fracture and bruising to right foot Skin: no rashes, dry, scaly, scratch and bruise to back s/p fall Neurological: Weakness, confusion, able to follow commands  IMPRESSION:  Patient seen in the clinic today after seeing Dr. Irene Limbo. Unfortunately he continues to decline physically and now mentally. Wife, daughter, and  bedside. Son arrived later. Family reports patient has been having intermittent confusion over the past 3-4 days, recurrent falls, decreased appetite, and generalized weakness. He has spent most of his days in bed and if he gets out he requires significant assistance.   Marcus Fuller was taking [name] however family discontinued as they felt he has declined since starting. Patient has known metastatic lung cancer with right frontal brain mets. He is followed by Dr. Mickeal Skinner and Dr. Irene Limbo. Recently started on Keppra due to focal seizures in the setting of neoplasm and Parkinson.   We discussed Her current illness and what it means in the larger context of Her on-going co-morbidities. Natural disease trajectory and expectations were discussed. We had an open and honest discussion in addition to Dr. Irene Limbo regarding patient's condition and  focusing more on his comfort and hospice vs. Continued aggressive interventions and treatment. Family is tearful and verbalized understanding.   Patient is observed having intermittent episodes of confusion. At times he is alert x3 and other times he confused, rambling incomprehensible, and seems agitated. Wife is tearful expressing this has been going on for several days in the home with sudden onset. Emotional support provided.   We discussed patient being admitted for further medical evaluation, work-up, ongoing goals of care discussions, and symptom management which Dr. Irene Limbo agrees. I was able to speak with Dr. Olevia Bowens [facility]) with agreement for admission. Family aware we will hold patient here for support with hopes a bed will become available soon.   I empathetically approach discussions regarding advanced directives and patient's full code status with consideration of his current illness and co-morbidities. We initially started discussions several weeks ago and family wanted more time to discuss amongst themselves. Unfortunately patient has not been able to complete the advanced  directive packet provided. Encourage family to consider DNR/DNI. Family verbalized understanding and expressed agreement with proceeding. Education provided on DNR and what this would look like for patient. Family verbalized understanding again confirming wishes.   I discussed the importance of continued conversation with family and their medical providers regarding overall plan of care and treatment options, ensuring decisions are within the context of the patients values and GOCs.  All questions answered and support provided.   PLAN: Patient to be admitted to [place] for medical management, ongoing goals of care discussions, and symptom management. I will plan to follow in the hospital for Palliative needs.  DNR/DNI Haldol as needed for agitation/anxiety  Family expressed understanding and was in agreement with this plan.   Time Total: 90 min  Visit consisted of counseling and education dealing with the complex and emotionally intense issues of symptom management and palliative care in the setting of serious and potentially life-threatening illness.Greater than 50%  of this time was spent counseling and coordinating care related to the above assessment and plan.  Signed by: Alda Lea, AGPCNP-BC Palliative Medicine Team

## 2021-11-19 NOTE — Progress Notes (Signed)
Met with patient/accompanying adult at registration to introduce myself as Financial Resource Specialist and to offer available resources.  Discussed one-time $1000 [employer] grant and qualifications to assist with personal expenses while going through treatment.  Gave my card if interested in applying and for any additional financial questions or concerns. 

## 2021-11-20 DIAGNOSIS — Z950 Presence of cardiac pacemaker: Secondary | ICD-10-CM | POA: Diagnosis not present

## 2021-11-20 DIAGNOSIS — C7931 Secondary malignant neoplasm of brain: Secondary | ICD-10-CM

## 2021-11-20 DIAGNOSIS — C799 Secondary malignant neoplasm of unspecified site: Secondary | ICD-10-CM | POA: Diagnosis not present

## 2021-11-20 DIAGNOSIS — C7951 Secondary malignant neoplasm of bone: Secondary | ICD-10-CM | POA: Diagnosis present

## 2021-11-20 DIAGNOSIS — Z20822 Contact with and (suspected) exposure to covid-19: Secondary | ICD-10-CM | POA: Diagnosis present

## 2021-11-20 DIAGNOSIS — S99921A Unspecified injury of right foot, initial encounter: Secondary | ICD-10-CM

## 2021-11-20 DIAGNOSIS — R29898 Other symptoms and signs involving the musculoskeletal system: Secondary | ICD-10-CM | POA: Diagnosis not present

## 2021-11-20 DIAGNOSIS — Z885 Allergy status to narcotic agent status: Secondary | ICD-10-CM | POA: Diagnosis not present

## 2021-11-20 DIAGNOSIS — I110 Hypertensive heart disease with heart failure: Secondary | ICD-10-CM | POA: Diagnosis present

## 2021-11-20 DIAGNOSIS — Z9049 Acquired absence of other specified parts of digestive tract: Secondary | ICD-10-CM | POA: Diagnosis not present

## 2021-11-20 DIAGNOSIS — Z23 Encounter for immunization: Secondary | ICD-10-CM | POA: Diagnosis present

## 2021-11-20 DIAGNOSIS — I480 Paroxysmal atrial fibrillation: Secondary | ICD-10-CM

## 2021-11-20 DIAGNOSIS — G9341 Metabolic encephalopathy: Secondary | ICD-10-CM | POA: Diagnosis present

## 2021-11-20 DIAGNOSIS — G20 Parkinson's disease: Secondary | ICD-10-CM

## 2021-11-20 DIAGNOSIS — D696 Thrombocytopenia, unspecified: Secondary | ICD-10-CM | POA: Diagnosis not present

## 2021-11-20 DIAGNOSIS — Z515 Encounter for palliative care: Secondary | ICD-10-CM | POA: Diagnosis not present

## 2021-11-20 DIAGNOSIS — Z7189 Other specified counseling: Secondary | ICD-10-CM | POA: Diagnosis not present

## 2021-11-20 DIAGNOSIS — Z66 Do not resuscitate: Secondary | ICD-10-CM

## 2021-11-20 DIAGNOSIS — M8458XA Pathological fracture in neoplastic disease, other specified site, initial encounter for fracture: Secondary | ICD-10-CM | POA: Diagnosis present

## 2021-11-20 DIAGNOSIS — Z87442 Personal history of urinary calculi: Secondary | ICD-10-CM | POA: Diagnosis not present

## 2021-11-20 DIAGNOSIS — I5030 Unspecified diastolic (congestive) heart failure: Secondary | ICD-10-CM | POA: Diagnosis not present

## 2021-11-20 DIAGNOSIS — G40209 Localization-related (focal) (partial) symptomatic epilepsy and epileptic syndromes with complex partial seizures, not intractable, without status epilepticus: Secondary | ICD-10-CM | POA: Diagnosis present

## 2021-11-20 DIAGNOSIS — Z955 Presence of coronary angioplasty implant and graft: Secondary | ICD-10-CM | POA: Diagnosis not present

## 2021-11-20 DIAGNOSIS — C3491 Malignant neoplasm of unspecified part of right bronchus or lung: Secondary | ICD-10-CM | POA: Diagnosis present

## 2021-11-20 DIAGNOSIS — I2510 Atherosclerotic heart disease of native coronary artery without angina pectoris: Secondary | ICD-10-CM | POA: Diagnosis present

## 2021-11-20 DIAGNOSIS — I5032 Chronic diastolic (congestive) heart failure: Secondary | ICD-10-CM | POA: Diagnosis present

## 2021-11-20 DIAGNOSIS — R4182 Altered mental status, unspecified: Secondary | ICD-10-CM | POA: Diagnosis present

## 2021-11-20 DIAGNOSIS — Y92009 Unspecified place in unspecified non-institutional (private) residence as the place of occurrence of the external cause: Secondary | ICD-10-CM | POA: Diagnosis not present

## 2021-11-20 DIAGNOSIS — R17 Unspecified jaundice: Secondary | ICD-10-CM | POA: Diagnosis present

## 2021-11-20 DIAGNOSIS — Z888 Allergy status to other drugs, medicaments and biological substances status: Secondary | ICD-10-CM | POA: Diagnosis not present

## 2021-11-20 LAB — CBC WITH DIFFERENTIAL/PLATELET
Abs Immature Granulocytes: 0.24 10*3/uL — ABNORMAL HIGH (ref 0.00–0.07)
Basophils Absolute: 0 10*3/uL (ref 0.0–0.1)
Basophils Relative: 1 %
Eosinophils Absolute: 0.1 10*3/uL (ref 0.0–0.5)
Eosinophils Relative: 2 %
HCT: 37.6 % — ABNORMAL LOW (ref 39.0–52.0)
Hemoglobin: 12.3 g/dL — ABNORMAL LOW (ref 13.0–17.0)
Immature Granulocytes: 5 %
Lymphocytes Relative: 15 %
Lymphs Abs: 0.7 10*3/uL (ref 0.7–4.0)
MCH: 29.4 pg (ref 26.0–34.0)
MCHC: 32.7 g/dL (ref 30.0–36.0)
MCV: 90 fL (ref 80.0–100.0)
Monocytes Absolute: 0.5 10*3/uL (ref 0.1–1.0)
Monocytes Relative: 10 %
Neutro Abs: 3.1 10*3/uL (ref 1.7–7.7)
Neutrophils Relative %: 67 %
Platelets: 142 10*3/uL — ABNORMAL LOW (ref 150–400)
RBC: 4.18 MIL/uL — ABNORMAL LOW (ref 4.22–5.81)
RDW: 18.6 % — ABNORMAL HIGH (ref 11.5–15.5)
WBC: 4.6 10*3/uL (ref 4.0–10.5)
nRBC: 0 % (ref 0.0–0.2)

## 2021-11-20 LAB — COMPREHENSIVE METABOLIC PANEL
ALT: 10 U/L (ref 0–44)
AST: 21 U/L (ref 15–41)
Albumin: 2.6 g/dL — ABNORMAL LOW (ref 3.5–5.0)
Alkaline Phosphatase: 84 U/L (ref 38–126)
Anion gap: 7 (ref 5–15)
BUN: 16 mg/dL (ref 8–23)
CO2: 24 mmol/L (ref 22–32)
Calcium: 8.3 mg/dL — ABNORMAL LOW (ref 8.9–10.3)
Chloride: 107 mmol/L (ref 98–111)
Creatinine, Ser: 0.79 mg/dL (ref 0.61–1.24)
GFR, Estimated: >60 mL/min (ref >60)
Glucose, Bld: 88 mg/dL (ref 70–99)
Potassium: 3.2 mmol/L — ABNORMAL LOW (ref 3.5–5.1)
Sodium: 138 mmol/L (ref 135–145)
Total Bilirubin: 1.4 mg/dL — ABNORMAL HIGH (ref 0.3–1.2)
Total Protein: 5 g/dL — ABNORMAL LOW (ref 6.5–8.1)

## 2021-11-20 LAB — RESP PANEL BY RT-PCR (FLU A&B, COVID) ARPGX2
Influenza A by PCR: NEGATIVE
Influenza B by PCR: NEGATIVE
SARS Coronavirus 2 by RT PCR: NEGATIVE

## 2021-11-20 LAB — FOLATE: Folate: 9.2 ng/mL (ref >5.9)

## 2021-11-20 LAB — VITAMIN B12: Vitamin B-12: 288 pg/mL (ref 180–914)

## 2021-11-20 LAB — MAGNESIUM: Magnesium: 1.6 mg/dL — ABNORMAL LOW (ref 1.7–2.4)

## 2021-11-20 LAB — PHOSPHORUS: Phosphorus: 2 mg/dL — ABNORMAL LOW (ref 2.5–4.6)

## 2021-11-20 MED ORDER — POTASSIUM CHLORIDE CRYS ER 20 MEQ PO TBCR
40.0000 meq | EXTENDED_RELEASE_TABLET | Freq: Once | ORAL | Status: AC
  Administered 2021-11-20: 40 meq via ORAL
  Filled 2021-11-20: qty 2

## 2021-11-20 NOTE — Evaluation (Signed)
Clinical/Bedside Swallow Evaluation Patient Details  Name: Marcus Fuller MRN: [id number] Date of Birth: Mar 22, 1941  Today's Date: 11/20/2021 Time: SLP Start Time (ACUTE ONLY): 9735 SLP Stop Time (ACUTE ONLY): 3299 SLP Time Calculation (min) (ACUTE ONLY): 15 min  Past Medical History:  Past Medical History:  Diagnosis Date   Anxiety    Cataract    beginning stages   Chronic low back pain 07/03/2019   Coronary artery disease    stent- 2007    History of kidney stones    Hypertension    Parkinson's disease ([place]) 01/29/2018   Presence of permanent cardiac pacemaker    2017    Tremor of right hand    Past Surgical History:  Past Surgical History:  Procedure Laterality Date   APPENDECTOMY     bbb     CARDIOVERSION N/A 04/19/2021   Procedure: CARDIOVERSION;  Surgeon: Lelon Perla, MD;  Location: [place];  Service: Cardiovascular;  Laterality: N/A;   CHOLECYSTECTOMY N/A 05/21/2018   Procedure: LAPAROSCOPIC CHOLECYSTECTOMY;  Surgeon: Coralie Keens, MD;  Location: [facility];  Service: General;  Laterality: N/A;   left knee arthroscopy   [place]  11/2016   STENT PLACEMENT VASCULAR ([place] HX)  2007   TONSILLECTOMY     HPI:  Patient is a 80 y.o. male with PMH: metastatic adenocarcinoma of lung with brain involvement, CAD, atrial fibrillation, parkinson's disease, and multiple other medical issues presenting to the hospital with AMS. He had initially presented to the cancer center for labs and palliative care consult but after labs were drawn he became increasingly confused, anxious and with rapid breathing. He was then brought to hospital for direct admission.    Assessment / Plan / Recommendation  Clinical Impression  Patient presents with clinical s/s of dysphagia as per this bedside swallow evaluation which appear to be largely impacted by his current cognitive state of lethargy and confusion. Patient consumed straw sips and cup sips of thin liquids (water)  and a few bites of puree solids (applesauce). He exhibited one delayed throat clear but otherwise tolerated purees and thin liquids well. No oral residuals or retention and swallow initiation appeared fairly timely. SLP is recommending initiate Dys 1 solids, thin liquids at this time. ST services will follow patient for toleration and ability to upgrade solids consistencies. SLP Visit Diagnosis: Dysphagia, unspecified (R13.10)    Aspiration Risk  Mild aspiration risk    Diet Recommendation Dysphagia 1 (Puree);Thin liquid   Liquid Administration via: Cup;Straw Medication Administration: Whole meds with puree Supervision: Full supervision/cueing for compensatory strategies;Staff to assist with self feeding Compensations: Minimize environmental distractions;Slow rate;Small sips/bites Postural Changes: Seated upright at 90 degrees    Other  Recommendations Oral Care Recommendations: Oral care BID;Staff/trained caregiver to provide oral care    Recommendations for follow up therapy are one component of a multi-disciplinary discharge planning process, led by the attending physician.  Recommendations may be updated based on patient status, additional functional criteria and insurance authorization.  Follow up Recommendations Other (comment) (TBD pending progress)      Assistance Recommended at Discharge Frequent or constant Supervision/Assistance  Functional Status Assessment Patient has had a recent decline in their functional status and demonstrates the ability to make significant improvements in function in a reasonable and predictable amount of time.  Frequency and Duration min 1 x/week  1 week       Prognosis Prognosis for Safe Diet Advancement: Good Barriers to Reach Goals: Cognitive deficits  Swallow Study   General Date of Onset: 11/19/21 HPI: Patient is a 80 y.o. male with PMH: metastatic adenocarcinoma of lung with brain involvement, CAD, atrial fibrillation, parkinson's  disease, and multiple other medical issues presenting to the hospital with AMS. He had initially presented to the cancer center for labs and palliative care consult but after labs were drawn he became increasingly confused, anxious and with rapid breathing. He was then brought to hospital for direct admission. Type of Study: Bedside Swallow Evaluation Previous Swallow Assessment: none found Diet Prior to this Study: NPO Temperature Spikes Noted: No Respiratory Status: Room air History of Recent Intubation: No Behavior/Cognition: Alert;Lethargic/Drowsy;Requires cueing Oral Cavity Assessment: Within Functional Limits Oral Care Completed by SLP: Yes Oral Cavity - Dentition: Missing dentition Self-Feeding Abilities: Total assist Patient Positioning: Upright in bed Baseline Vocal Quality: Low vocal intensity Volitional Cough: Cognitively unable to elicit Volitional Swallow: Unable to elicit    Oral/Motor/Sensory Function Overall Oral Motor/Sensory Function: Within functional limits   Ice Chips     Thin Liquid Thin Liquid: Impaired Oral Phase Impairments: Poor awareness of bolus Pharyngeal  Phase Impairments: Throat Clearing - Delayed    Nectar Thick     Honey Thick     Puree Puree: Within functional limits Presentation: Spoon   Solid     Solid: Not tested      Sonia Baller, MA, CCC-SLP Speech Therapy

## 2021-11-20 NOTE — Progress Notes (Signed)
   Daily Progress Note   Patient Name: Marcus Fuller       Date: 11/20/2021 DOB: 07/06/1941  Age: 80 y.o. MRN#: [id number] Attending Physician: Kayleen Memos, DO Primary Care Physician: Cari Caraway, MD Admit Date: 11/19/2021  Reason for Consultation/Follow-up: Establishing goals of care, Non pain symptom management, Pain control, and Psychosocial/spiritual support  Subjective: Chart Reviewed. Updates Received. Patient Assessed.   Marcus Fuller appears much more calmer and more like himself today compared to yesterday. Family at the bedside and reports he has worked with PT and tolerated. No acute distress noted. He is alert and oriented x3. Able to recognize myself and call me by name.   He is sitting up in recliner watching golf. Shares his favorite golfer is [employer]. He shares memories of when he played golf as he and his son were avid players. Denies pain. Appetite remains fair. He ate a donut brought in by family today.   Ongoing goals of care discussions. Family is appreciative of support. They are remaining hopeful patient may be able to return home pending hospital trajectory.   I discussed the importance of continued conversation with family and their medical providers regarding overall plan of care and treatment options, ensuring decisions are within the context of the patients values and GOCs.  All questions answered and support provided.   Length of Stay: 1 day  Vital Signs: BP 130/69 (BP Location: Left Arm)   Pulse 68   Temp 97.9 F (36.6 C) (Oral)   Resp 16   Wt 76.5 kg   SpO2 97%   BMI 23.52 kg/m  SpO2: SpO2: 97 % O2 Device: O2 Device: Room Air O2 Flow Rate:    Physical Exam: NAD, ill-appearing  RRR Normal breathing pattern Skin dry, flaky, scratch to back s/p fall Right toes ecchymosis  AAO x3, continues to have some intermittent confusion, follows commands               Palliative Care Assessment & Plan  HPI: Marcus Fuller is a 80 y.o. male with  multiple medical problems including hypertension, Parkinson's disease, CAD s/p PCI (2007), pacemaker, chronic low back pain, and atrial fibrillation (Digoxin and Eliquis). Patient with newly diagnosed metastatic lung adenocarcinoma with frontal lobe brain mets and bone lesions. Palliative ask to see for symptom management and goals of care.  Code Status: DNR  Goals of Care/Recommendations: Continue current plan of care per attending Family is remaining hopeful for stability with awareness of poor long-term prognosis. Goal is to allow Marcus Fuller to continue to thrive and do good for as long as he can.  PMT will continue to support and follow.  Please reach out if any questions arise. I am off service tomorrow. Plans to follow-up Monday.   Prognosis: Guarded-Poor  Discharge Planning: To Be Determined  Thank you for allowing the Palliative Medicine Team to assist in the care of this patient.  Time Total: 25 min.   Visit consisted of counseling and education dealing with the complex and emotionally intense issues of symptom management and palliative care in the setting of serious and potentially life-threatening illness.Greater than 50%  of this time was spent counseling and coordinating care related to the above assessment and plan.  Alda Lea, AGPCNP-BC  Palliative Medicine Team [phone]

## 2021-11-20 NOTE — Evaluation (Signed)
Physical Therapy Evaluation Patient Details Name: Marcus Fuller MRN: [id number] DOB: [birth year] Today's Date: 11/20/2021  History of Present Illness  Marcus Fuller is a 80 y.o. 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  Clinical Impression  Pt alert during our assessment today, witty and humorous in some of his responses. Family present to fill in history due to pt not fully alert to location and recent history, does recall past history. Pt with more strength and ability today ( per family ) than they have seen in a while. Does have slowed response time and motor planing requiring constant full tactile and verbal cues for any activities. Standing and turning seem difficult with small steps and " frozen" moments making foot progression difficult. These seem t be parkinson like characteristics/deficits but can be mixed with his other medical diagnoses as well.  Will continue to follow while here in acute care. Family seems to want to take pt home, will need good support and equipment for good and bad days with his medical complexities.        Recommendations for follow up therapy are one component of a multi-disciplinary discharge planning process, led by the attending physician.  Recommendations may be updated based on patient status, additional functional criteria and insurance authorization.  Follow Up Recommendations Home health PT    Assistance Recommended at Discharge Frequent or constant Supervision/Assistance  Functional Status Assessment Patient has had a recent decline in their functional status and demonstrates the ability to make significant improvements in function in a reasonable and predictable amount of time.  Equipment Recommendations  BSC/3in1;Hospital bed (unclear of what patient has, but will need RW,  WC, hosptial bed ( unless pt progresses) and 3n1)    Recommendations for Other Services       Precautions / Restrictions Precautions Precautions: Fall Restrictions Weight Bearing Restrictions: No      Mobility  Bed Mobility Overal bed mobility: Needs Assistance Bed Mobility: Supine to Sit     Supine to sit: Mod assist;+2 for physical assistance;HOB elevated     General bed mobility comments: cues for each step verbal and tactile . Impoved ability with cues and momentum of full action being asked of him    Transfers Overall transfer level: Needs assistance Equipment used: Rolling walker (2 wheels) Transfers: Sit to/from Stand;Bed to chair/wheelchair/BSC Sit to Stand: Min assist;+2 safety/equipment   Step pivot transfers: Mod assist       General transfer comment: Min x 2 to stand from bed with RW. Mod assist with second person for safety to take steps forward and then turn to recliner. cues for "big " steps and assistance at hips for rocking for weight shifting in order to get feet to progress . small fextinating steps espcially with the turn pivot with is challenign in parkinson patients.    Ambulation/Gait Ambulation/Gait assistance: +2 physical assistance;Mod assist;Min assist Gait Distance (Feet): 4 Feet Assistive device: Rolling walker (2 wheels)         General Gait Details: 4-5 steps forward and then tsome turning steps and backward steps to the recliner from the bed. All required tactile and verbal cues at the hips for rhythm for weight shifting and steeping.  Stairs            Wheelchair Mobility    Modified Rankin (Stroke Patients Only)       Balance  Overall balance assessment: Needs assistance Sitting-balance support: No upper extremity supported;Feet supported Sitting balance-Leahy Scale: Fair     Standing balance support: Bilateral upper extremity supported Standing balance-Leahy Scale: Poor                                Pertinent Vitals/Pain Pain Assessment: Faces Faces Pain Scale: Hurts little more Pain Location: inguinal area Pain Descriptors / Indicators: Grimacing;Guarding Pain Intervention(s): Limited activity within patient's tolerance    Home Living Family/patient expects to be discharged to:: Private residence Living Arrangements: Spouse/significant other Available Help at Discharge: Family;Available 24 hours/day Type of Home: House Home Access: Level entry       Home Layout: One level Home Equipment: Conservation officer, nature (2 wheels);Wheelchair - manual      Prior Function Prior Level of Function : Needs assist  Cognitive Assist : Mobility (cognitive);ADLs (cognitive) Mobility (Cognitive): Step by step cues ADLs (Cognitive): Step by step cues Physical Assist : Mobility (physical);ADLs (physical) Mobility (physical): Bed mobility;Transfers   Mobility Comments: had been using walker, however declining more the last month adn needing a lot of assistance just for stand pivot transfers out of bed to [facility] to couch. Not moving much in last month and family think s it was the new chemo medicine ADLs Comments: able to perform grooming and some self feeding - depending on typie of food and tremors     Hand Dominance   Dominant Hand: Right    Extremity/Trunk Assessment   Upper Extremity Assessment Upper Extremity Assessment: RUE deficits/detail;LUE deficits/detail RUE Deficits / Details: WFL ROM, grossly 4-/5 strength RUE Coordination: decreased gross motor;decreased fine motor LUE Deficits / Details: WFL ROM, grossly 4-/5 strength LUE Coordination: decreased gross motor;decreased fine motor    Lower Extremity Assessment Lower Extremity Assessment: RLE deficits/detail;LLE deficits/detail RLE Deficits / Details: ( R LE more weak than L) all AAROM with normal limits, strength grossly 3-/5, unable to perform SLR supine independently, cues for heel slides , with cues verbal and tactile movement  imrpoved. Hip flexor 3-/5 as well. R 2nd toe with brusing and some pain but wife states it i snot broken LLE Deficits / Details: all AAROM with normal limits, strength grossly 3/5, unable to perform SLR supine independently, cues for heel slides , with cues verbal and tactile movement imrpoved. Hip flexor 3/5 as well    Cervical / Trunk Assessment Cervical / Trunk Assessment: Kyphotic  Communication   Communication: No difficulties  Cognition Arousal/Alertness: Awake/alert Behavior During Therapy: WFL for tasks assessed/performed Overall Cognitive Status: Impaired/Different from baseline Area of Impairment: Orientation;Attention;Memory;Safety/judgement                 Orientation Level: Disoriented to;Place;Time;Situation Current Attention Level: Sustained Memory: Decreased short-term memory Following Commands: Follows one step commands consistently Safety/Judgement: Decreased awareness of deficits       Functional Status Assessment: Patient has had a recent decline in their functional status and demonstrates the ability to make significant improvements in function in a reasonable and predictable amount of time.      General Comments      Exercises     Assessment/Plan    PT Assessment Patient needs continued PT services  PT Problem List Decreased strength;Decreased range of motion;Decreased activity tolerance;Decreased mobility       PT Treatment Interventions DME instruction;Gait training;Functional mobility training;Therapeutic activities;Therapeutic exercise;Patient/family education    PT Goals (Current goals can be found in the Care Plan  section)  Acute Rehab PT Goals Patient Stated Goal: I want to get better and go home PT Goal Formulation: With patient/family Time For Goal Achievement: 12/04/21 Potential to Achieve Goals: Good    Frequency Min 3X/week   Barriers to discharge        Co-evaluation PT/OT/SLP Co-Evaluation/Treatment: Yes Reason for  Co-Treatment: Complexity of the patient's impairments (multi-system involvement) PT goals addressed during session: Mobility/safety with mobility         AM-PAC PT "6 Clicks" Mobility  Outcome Measure Help needed turning from your back to your side while in a flat bed without using bedrails?: A Lot Help needed moving from lying on your back to sitting on the side of a flat bed without using bedrails?: A Lot Help needed moving to and from a bed to a chair (including a wheelchair)?: A Lot Help needed standing up from a chair using your arms (e.g., wheelchair or bedside chair)?: A Little Help needed to walk in hospital room?: A Lot Help needed climbing 3-5 steps with a railing? : A Lot 6 Click Score: 13    End of Session Equipment Utilized During Treatment: Gait belt Activity Tolerance: Patient tolerated treatment well Patient left: in chair;with call bell/phone within reach;with chair alarm set;with family/visitor present Nurse Communication: Mobility status PT Visit Diagnosis: Unsteadiness on feet (R26.81);Muscle weakness (generalized) (M62.81);Repeated falls (R29.6)    Time: 7867-6720 PT Time Calculation (min) (ACUTE ONLY): 33 min   Charges:   PT Evaluation $PT Eval Moderate Complexity: 1 Mod          Kathelyn Gombos, PT, MPT Acute Rehabilitation Services Office: [phone] Pager: [phone] 11/20/2021   Alvia Tory, Gatha Mayer 11/20/2021, 1:20 PM

## 2021-11-20 NOTE — Progress Notes (Addendum)
PROGRESS NOTE  [name] FTD:[id number] DOB: Apr 16, 1941 DOA: 11/19/2021 PCP: Cari Caraway, MD  HPI/Recap of past 24 hours: Marcus Fuller is a 80 y.o. 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  Etiology of his confusion is unclear at this time.  CT head and chest x-ray were nonacute.  UA, MRI brain, and EEG are pending.  11/20/2021: Patient was seen and examined at his bedside.  He is alert and pleasantly confused.  Assessment/Plan: Principal Problem:   Acute metabolic encephalopathy Active Problems:   Parkinson disease (HCC)   Pacemaker   Coronary artery disease   AF (paroxysmal atrial fibrillation) (HCC)   Brain metastases (HCC)   Metastatic adenocarcinoma (HCC)   (HFpEF) heart failure with preserved ejection fraction (HCC)   Lower extremity weakness   Toe trauma   AMS (altered mental status)   * Acute metabolic encephalopathy, unclear etiology Broad ddx at this point, infectious vs metastatic cancer vs seizure vs medication, etc Chest x-ray and CT head nonacute. Follow UA and urine culture. Follow EEG and MRI brain. Reorient as needed Nonpharmacological management of delirium Regulate sleep and wake cycle Out of bed to chair during the day  Brain metastases ([place]) MRI 09/25/21 with 3 unchanged mets in right frontal lobe Will follow CT head with/without contrast Follows with Dr. Irene Limbo and Dr. Mickeal Skinner, unable to reach them today to discuss care, will reach out again tomorrow Concern for complex partial seizures in 11/09/21 note from neuro oncology Continue keppra 500 mg BID (follow dose, med rec pending - different from [facility] note and meds in chart) EEG as above Follow-up MRI brain  Delirium Delirium precautions Regulate awake and sleep cycle Trazodone  nightly. Out of bed to chair during the day, open blinds during the day.   Metastatic adenocarcinoma ([place]) Metastatic lung adenocarcinoma with bone mets and brain metastases Was on tagrisso, but has been holding this at least past 5 days or so per discussion with family  S/p SRS with radiation oncology Completed steroid taper, appears on 11/14 Will discuss with Dr. Irene Limbo   Lower extremity weakness Hasn't walked in well over a month MRI 8/17 with expansile lesion in R iliac bone concerning for malignancy, 2 small foci of signal abnormality in L2 and L3 vertebral bodies concerning for additional small mets Follow CT L spine with/without contrast Follow-up MRI brain PT OT assessment Fall precautions   (HFpEF) heart failure with preserved ejection fraction ([place]) Euvolemic on exam Echo 2019 with EF 50-55% Strict I's and O's and daily weight   Coronary artery disease Denies having any anginal symptoms.   Pacemaker Noted, will [facility] need [facility]   Parkinson disease (HCC) Sinemet, dose increased per [facility] Continue dose prescribed for Dr. Jannifer Franklin and follow   AF (paroxysmal atrial fibrillation) (HCC) Digoxin, eliquis   Toe trauma Right foot x-ray no fractures. As needed analgesics   Code Status: Full code  Family Communication: Updated his wife via phone.  Disposition Plan: Likely will discharge to home once medical/neuro oncology sign off.   Consultants: Medical oncology Neuro-oncology  Procedures: EEG ordered, pending  Antimicrobials: None  DVT prophylaxis: Eliquis  Status is: Inpatient  Inpatient status.  Patient will require at least 2 midnights for further evaluation and treatment of present condition.      Objective: Vitals:  11/20/21 0128 11/20/21 0500 11/20/21 0532 11/20/21 1345  BP: (!) 157/80  (!) 143/87 118/76  Pulse: 91  78 90  Resp: 19  19 17   Temp: 97.7 F (36.5 C)  97.8 F (36.6 C) 97.7 F (36.5 C)  TempSrc: Oral   Oral Oral  SpO2: 95%  97% 96%  Weight:  76.5 kg      Intake/Output Summary (Last 24 hours) at 11/20/2021 1528 Last data filed at 11/20/2021 0539 Gross per 24 hour  Intake 1048.91 ml  Output 450 ml  Net 598.91 ml   Filed Weights   11/20/21 0500  Weight: 76.5 kg    Exam:  General: 80 y.o. year-old male well developed well nourished in no acute distress.  Alert and pleasantly confused. Cardiovascular: Regular rate and rhythm with no rubs or gallops.  No thyromegaly or JVD noted.   Respiratory: Clear to auscultation with no wheezes or rales. Good inspiratory effort. Abdomen: Soft nontender nondistended with normal bowel sounds x4 quadrants. Musculoskeletal: No lower extremity edema. 2/4 pulses in all 4 extremities. Skin: Bruising affecting right toes. Psychiatry: Mood is appropriate for condition and setting   Data Reviewed: CBC: Recent Labs  Lab 11/19/21 1008 11/20/21 0642  WBC 5.0 4.6  NEUTROABS 3.6 3.1  HGB 13.0 12.3*  HCT 40.1 37.6*  MCV 89.3 90.0  PLT 163 767*   Basic Metabolic Panel: Recent Labs  Lab 11/19/21 1008 11/20/21 0642  NA 141 138  K 3.8 3.2*  CL 108 107  CO2 23 24  GLUCOSE 106* 88  BUN 18 16  CREATININE 0.83 0.79  CALCIUM 8.7* 8.3*  MG  --  1.6*  PHOS  --  2.0*   GFR: Estimated Creatinine Clearance: 79.7 mL/min (by C-G formula based on SCr of 0.79 mg/dL). Liver Function Tests: Recent Labs  Lab 11/19/21 1008 11/20/21 0642  AST 16 21  ALT 6 10  ALKPHOS 118 84  BILITOT 0.9 1.4*  PROT 5.6* 5.0*  ALBUMIN 2.7* 2.6*   No results for input(s): LIPASE, AMYLASE in the last 168 hours. Recent Labs  Lab 11/19/21 1852  AMMONIA 18   Coagulation Profile: No results for input(s): INR, PROTIME in the last 168 hours. Cardiac Enzymes: No results for input(s): CKTOTAL, CKMB, CKMBINDEX, TROPONINI in the last 168 hours. BNP (last 3 results) No results for input(s): PROBNP in the last 8760 hours. HbA1C: No results for input(s): HGBA1C in the last  72 hours. CBG: No results for input(s): GLUCAP in the last 168 hours. Lipid Profile: No results for input(s): CHOL, HDL, LDLCALC, TRIG, CHOLHDL, LDLDIRECT in the last 72 hours. Thyroid Function Tests: Recent Labs    11/19/21 1852  TSH 4.455   Anemia Panel: Recent Labs    11/20/21 0642  VITAMINB12 288  FOLATE 9.2   Urine analysis:    Component Value Date/Time   COLORURINE YELLOW 10/11/2021 1442   APPEARANCEUR CLEAR 10/11/2021 1442   APPEARANCEUR Clear 06/20/2018 1122   LABSPEC 1.009 10/11/2021 1442   PHURINE 6.0 10/11/2021 1442   GLUCOSEU NEGATIVE 10/11/2021 1442   HGBUR NEGATIVE 10/11/2021 1442   BILIRUBINUR NEGATIVE 10/11/2021 1442   BILIRUBINUR Negative 06/20/2018 1122   KETONESUR NEGATIVE 10/11/2021 1442   PROTEINUR NEGATIVE 10/11/2021 1442   NITRITE NEGATIVE 10/11/2021 1442   LEUKOCYTESUR NEGATIVE 10/11/2021 1442   Sepsis Labs: @LABRCNTIP (procalcitonin:4,lacticidven:4)  ) Recent Results (from the past 240 hour(s))  Resp Panel by RT-PCR (Flu A&B, Covid) Nasopharyngeal Swab     Status: None   Collection Time: 11/19/21  11:12 PM   Specimen: Nasopharyngeal Swab; Nasopharyngeal(NP) swabs in vial transport medium  Result Value Ref Range Status   SARS Coronavirus 2 by RT PCR NEGATIVE NEGATIVE Final    Comment: (NOTE) SARS-CoV-2 target nucleic acids are NOT DETECTED.  The SARS-CoV-2 RNA is generally detectable in upper respiratory specimens during the acute phase of infection. The lowest concentration of SARS-CoV-2 viral copies this assay can detect is 138 copies/mL. A negative result does not preclude SARS-Cov-2 infection and should not be used as the sole basis for treatment or other patient management decisions. A negative result may occur with  improper specimen collection/handling, submission of specimen other than nasopharyngeal swab, presence of viral mutation(s) within the areas targeted by this assay, and inadequate number of viral copies(<138 copies/mL).  A negative result must be combined with clinical observations, patient history, and epidemiological information. The expected result is Negative.  Fact Sheet for Patients:  EntrepreneurPulse.com.au  Fact Sheet for Healthcare Providers:  IncredibleEmployment.be  This test is no t yet approved or cleared by the [place] FDA and  has been authorized for detection and/or diagnosis of SARS-CoV-2 by FDA under an Emergency Use Authorization (EUA). This EUA will remain  in effect (meaning this test can be used) for the duration of the COVID-19 declaration under Section 564(b)(1) of the Act, 21 U.S.C.section 360bbb-3(b)(1), unless the authorization is terminated  or revoked sooner.       Influenza A by PCR NEGATIVE NEGATIVE Final   Influenza B by PCR NEGATIVE NEGATIVE Final    Comment: (NOTE) The Xpert Xpress SARS-CoV-2/FLU/RSV plus assay is intended as an aid in the diagnosis of influenza from Nasopharyngeal swab specimens and should not be used as a sole basis for treatment. Nasal washings and aspirates are unacceptable for Xpert Xpress SARS-CoV-2/FLU/RSV testing.  Fact Sheet for Patients: EntrepreneurPulse.com.au  Fact Sheet for Healthcare Providers: IncredibleEmployment.be  This test is not yet approved or cleared by the [place] FDA and has been authorized for detection and/or diagnosis of SARS-CoV-2 by FDA under an Emergency Use Authorization (EUA). This EUA will remain in effect (meaning this test can be used) for the duration of the COVID-19 declaration under Section 564(b)(1) of the Act, 21 U.S.C. section 360bbb-3(b)(1), unless the authorization is terminated or revoked.  Performed at [facility], [place] [address] [place], [place] [address]       Studies: CT HEAD W & WO CONTRAST (5MM)  Result Date: 11/19/2021 CLINICAL DATA:  Metastatic disease evaluation EXAM: CT HEAD  WITHOUT AND WITH CONTRAST TECHNIQUE: Contiguous axial images were obtained from the base of the skull through the vertex without and with intravenous contrast CONTRAST:  140mL OMNIPAQUE IOHEXOL 350 MG/ML SOLN COMPARISON:  Brain MRI 09/24/2021 FINDINGS: Brain: Unchanged 9 mm meningioma of the right middle cranial fossa. The enhancing metastases shown on the brain MRI of 09/24/2021 are not visible on this study. There is generalized atrophy without lobar predilection. Hypodensity of the white matter is most commonly associated with chronic microvascular disease. Vascular: No abnormal hyperdensity of the major intracranial arteries or dural venous sinuses. No intracranial atherosclerosis. Skull: The visualized skull base, calvarium and extracranial soft tissues are normal. Sinuses/Orbits: No fluid levels or advanced mucosal thickening of the visualized paranasal sinuses. No mastoid or middle ear effusion. The orbits are normal. IMPRESSION: Unchanged 9 mm right middle cranial fossa meningioma. The enhancing metastases shown on the brain MRI of 09/24/2021 are not visible on this study. Electronically Signed   By: Ulyses Jarred  M.D.   On: 11/19/2021 21:42   CT LUMBAR SPINE W CONTRAST  Result Date: 11/19/2021 CLINICAL DATA:  Metastatic disease to the right hemipelvis. Follow-up study. EXAM: CT LUMBAR SPINE WITH CONTRAST TECHNIQUE: Multidetector CT imaging of the lumbar spine was performed with intravenous contrast administration. CONTRAST:  164mL OMNIPAQUE IOHEXOL 350 MG/ML SOLN COMPARISON:  CT chest, abdomen and pelvis 09/03/2021. FINDINGS: Segmentation: 5 lumbar type vertebrae. Alignment: There is mild lumbar dextroscoliosis apex at L2 and again noted minimal grade 1 retrolisthesis at L3-4. No new or worsening alignment abnormality is seen. Vertebrae: There is osteopenia. No acute spinal compression fracture or destructive lumbar bone lesion is seen. There is mild chronic wedging of the L1 vertebral body. There is  moderate marginal osteophytosis at the upper 3 lumbar levels, less prominent marginal osteophytes at the lower 2 levels. Paraspinal and other soft tissues: There is aortoiliac atherosclerosis and abdominal aortic tortuosity without AAA. No paraspinal mass is seen. A small left renal cyst is again noted. There is no left renal calculus or hydronephrosis with the right kidney not included in the imaging. Disc levels: T11-12 and T12-L1: Not well seen due to artifact from the patient's arms in the field but grossly unremarkable. L1-2: There is a normal disc height. No bulge, herniation or stenosis is seen. L2-3: No bulge, herniation or stenosis. L3-4: Stable grade 1 retrolisthesis. There is preservation of the normal disc height. Due to a posterior disc bulge and ligamentous thickening there is mild spinal canal and lateral recess stenosis. Due to facet spurring there is mild-to-moderate right foraminal stenosis. The left foramen is clear. L4-5: There is preservation of the normal disc height. Broad posterior disc bulge causes slight spinal canal AP stenosis and lateral recess stenosis. Right greater than left facet hypertrophy is present and there is moderate right, mild left foraminal stenosis. L5-S1: There is slight disc space loss with vacuum phenomenon. There is a nonstenosing posterior disc bulge without herniation. Right greater than left facet hypertrophy again noted with moderate to severe right, mild left foraminal stenosis. Other: Expansile destructive lesion of the posterior right iliac wing abutting the SI joint interface is again shown. It is not fully imaged but the visualized portion is larger than previously. A pathologic hairline fracture of the anterior aspect of the lesion is present on the lowest slices. Maximum measured AP and transverse axis on the current exam are 4.2 x 6.4 cm, on prior scan 3.6 x 6.1 cm. IMPRESSION: 1. 4.2 x 6.4 cm destructive expansile lesion of the posterior right iliac wing  is slightly larger than on the 09/03/2021 CT. There is pathologic fracture through the anterior cortex. 2. MRI of 08/03/2021 demonstrated 2 small foci of signal abnormality in the L2 and 3 vertebral bodies, but these are not visible on noncontrast CT. 3. Osteopenia, scoliosis and degenerative changes described above, stable. 4. Aortic atherosclerosis. Electronically Signed   By: Telford Nab M.D.   On: 11/19/2021 22:08   DG CHEST PORT 1 VIEW  Result Date: 11/19/2021 CLINICAL DATA:  Encephalopathy EXAM: PORTABLE CHEST 1 VIEW COMPARISON:  None. FINDINGS: The heart size and mediastinal contours are within normal limits. Both lungs are clear. The visualized skeletal structures are unremarkable. IMPRESSION: No active disease. Electronically Signed   By: Ulyses Jarred M.D.   On: 11/19/2021 19:48   DG Foot Complete Right  Result Date: 11/19/2021 CLINICAL DATA:  Recent fall EXAM: RIGHT FOOT COMPLETE - 3+ VIEW COMPARISON:  None. FINDINGS: There is no evidence of fracture or  dislocation. There is no evidence of arthropathy or other focal bone abnormality. Soft tissues are unremarkable. IMPRESSION: Negative. Electronically Signed   By: Ulyses Jarred M.D.   On: 11/19/2021 19:49    Scheduled Meds:  apixaban  5 mg Oral BID   carbidopa-levodopa  1 tablet Oral QAC breakfast   And   carbidopa-levodopa  2 tablet Oral QPM   digoxin  125 mcg Oral Once per day on Sun Tue Thu Sat   levETIRAcetam  500 mg Oral BID   thiamine injection  100 mg Intravenous Daily    Continuous Infusions:  dextrose 5% lactated ringers 100 mL/hr at 11/20/21 0400     LOS: 1 day     Kayleen Memos, MD [facility] Pager [phone]  If 7PM-7AM, please contact night-coverage [email or web address] Password [email or web address] 11/20/2021, 3:28 PM

## 2021-11-20 NOTE — Evaluation (Signed)
Occupational Therapy Evaluation Patient Details Name: Marcus Fuller MRN: [id number] DOB: Apr 11, 1941 Today's Date: 11/20/2021   History of Present Illness Marcus Fuller is a 80 y.o. 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   Clinical Impression   Marcus Fuller is a 80 year old man who presents with generalized weakness, decreased activity tolerance, impaired balance, tremors and festinating gait from Parkinson's and confusion. Per family patient has progressively gotten weaker and only been able to transfer for the last month.  On evaluation he was min x 2 to stand and mod assist with second person for safety to take steps and turn to recliner. He is currently requiring max-total assist for UB bathing, dressing and toileting. Patient will benefit from skilled OT services while in hospital to improve deficits and learn compensatory strategies as needed in order to reduce caregiver burden.       Recommendations for follow up therapy are one component of a multi-disciplinary discharge planning process, led by the attending physician.  Recommendations may be updated based on patient status, additional functional criteria and insurance authorization.   Follow Up Recommendations  Home health OT    Assistance Recommended at Discharge Frequent or constant Supervision/Assistance  Functional Status Assessment  Patient has had a recent decline in their functional status and demonstrates the ability to make significant improvements in function in a reasonable and predictable amount of time.  Equipment Recommendations  Hospital bed (may need hospital bed)    Recommendations for Other Services       Precautions / Restrictions Precautions Precautions: Fall Restrictions Weight Bearing Restrictions: No      Mobility  Bed Mobility Overal bed mobility: Needs Assistance Bed Mobility: Supine to Sit     Supine to sit: Mod assist;+2 for physical assistance;HOB elevated          Transfers Overall transfer level: Needs assistance   Transfers: Sit to/from Stand;Bed to chair/wheelchair/BSC Sit to Stand: Min assist;+2 safety/equipment     Step pivot transfers: Mod assist     General transfer comment: Min x 2 to stand from bed with RW. Mod assist with second person for safety to take steps forward and then turn to recliner.      Balance Overall balance assessment: Needs assistance Sitting-balance support: No upper extremity supported;Feet supported Sitting balance-Leahy Scale: Fair     Standing balance support: Bilateral upper extremity supported Standing balance-Leahy Scale: Poor                             ADL either performed or assessed with clinical judgement   ADL Overall ADL's : Needs assistance/impaired Eating/Feeding: Set up;Sitting   Grooming: Set up;Sitting;Wash/dry face   Upper Body Bathing: Maximal assistance;Sitting   Lower Body Bathing: Maximal assistance;Sitting/lateral leans   Upper Body Dressing : Maximal assistance;Sitting   Lower Body Dressing: Total assistance;Sit to/from stand;+2 for physical assistance Lower Body Dressing Details (indicate cue type and reason): +2 assist for standing and managing clothing     Toileting- Clothing Manipulation and Hygiene: Total assistance Toileting - Clothing Manipulation Details (indicate cue type and reason): incontinent     Functional mobility during ADLs: Moderate assistance;+2 for safety/equipment;Rolling walker (2 wheels)       Vision Baseline Vision/History: 1 Wears glasses       Perception     Praxis  Pertinent Vitals/Pain Pain Assessment: Faces Faces Pain Scale: Hurts little more Pain Location: inguinal area Pain Descriptors / Indicators: Grimacing;Guarding Pain Intervention(s): Limited  activity within patient's tolerance     Hand Dominance Right   Extremity/Trunk Assessment Upper Extremity Assessment Upper Extremity Assessment: RUE deficits/detail;LUE deficits/detail RUE Deficits / Details: WFL ROM, grossly 4-/5 strength RUE Coordination: decreased gross motor;decreased fine motor LUE Deficits / Details: WFL ROM, grossly 4-/5 strength LUE Coordination: decreased gross motor;decreased fine motor   Lower Extremity Assessment Lower Extremity Assessment: Defer to PT evaluation   Cervical / Trunk Assessment Cervical / Trunk Assessment: Kyphotic   Communication Communication Communication: No difficulties   Cognition Arousal/Alertness: Awake/alert Behavior During Therapy: WFL for tasks assessed/performed Overall Cognitive Status: Impaired/Different from baseline Area of Impairment: Orientation;Attention;Memory;Safety/judgement                 Orientation Level: Disoriented to;Place;Time;Situation Current Attention Level: Sustained Memory: Decreased short-term memory Following Commands: Follows one step commands consistently Safety/Judgement: Decreased awareness of deficits           General Comments       Exercises     Shoulder Instructions      Home Living Family/patient expects to be discharged to:: Private residence Living Arrangements: Spouse/significant other Available Help at Discharge: Family;Available 24 hours/day Type of Home: House Home Access: Level entry                     Home Equipment: Conservation officer, nature (2 wheels)          Prior Functioning/Environment Prior Level of Function : Needs assist             Mobility Comments: had been using walker, for the last month needing physical assist to stand and transfer ADLs Comments: able to perform grooming and some self feeding - depending on typie of food and tremors        OT Problem List: Decreased strength;Decreased activity tolerance;Impaired balance (sitting  and/or standing);Decreased safety awareness;Decreased cognition;Decreased coordination;Decreased knowledge of use of DME or AE;Pain      OT Treatment/Interventions: Self-care/ADL training;Therapeutic exercise;DME and/or AE instruction;Therapeutic activities;Patient/family education    OT Goals(Current goals can be found in the care plan section) Acute Rehab OT Goals Patient Stated Goal: improve/maintain mobility OT Goal Formulation: With family Time For Goal Achievement: 12/04/21 Potential to Achieve Goals: Fair  OT Frequency: Min 2X/week   Barriers to D/C:            Co-evaluation              AM-PAC OT "6 Clicks" Daily Activity     Outcome Measure Help from another person eating meals?: A Little Help from another person taking care of personal grooming?: A Little Help from another person toileting, which includes using toliet, bedpan, or urinal?: Total Help from another person bathing (including washing, rinsing, drying)?: A Lot Help from another person to put on and taking off regular upper body clothing?: A Lot Help from another person to put on and taking off regular lower body clothing?: Total 6 Click Score: 12   End of Session Equipment Utilized During Treatment: Rolling walker (2 wheels);Gait belt Nurse Communication: Mobility status  Activity Tolerance: Patient tolerated treatment well Patient left: in chair;with call bell/phone within reach;with chair alarm set  OT Visit Diagnosis: Other abnormalities of gait and mobility (R26.89);Unsteadiness on feet (R26.81);Pain;Muscle weakness (generalized) (M62.81)                Time: 2094-7096  OT Time Calculation (min): 33 min Charges:  OT Evaluation $OT Eval Moderate Complexity: 1 Mod  Marcus Fuller, OTR/L [place]  Office [phone] Pager: [place] 11/20/2021, 12:46 PM

## 2021-11-21 DIAGNOSIS — R4182 Altered mental status, unspecified: Secondary | ICD-10-CM

## 2021-11-21 DIAGNOSIS — Z7189 Other specified counseling: Secondary | ICD-10-CM | POA: Diagnosis not present

## 2021-11-21 DIAGNOSIS — G9341 Metabolic encephalopathy: Secondary | ICD-10-CM | POA: Diagnosis not present

## 2021-11-21 DIAGNOSIS — C799 Secondary malignant neoplasm of unspecified site: Secondary | ICD-10-CM | POA: Diagnosis not present

## 2021-11-21 LAB — COMPREHENSIVE METABOLIC PANEL
ALT: 9 U/L (ref 0–44)
AST: 24 U/L (ref 15–41)
Albumin: 2.7 g/dL — ABNORMAL LOW (ref 3.5–5.0)
Alkaline Phosphatase: 97 U/L (ref 38–126)
Anion gap: 7 (ref 5–15)
BUN: 11 mg/dL (ref 8–23)
CO2: 26 mmol/L (ref 22–32)
Calcium: 8.2 mg/dL — ABNORMAL LOW (ref 8.9–10.3)
Chloride: 106 mmol/L (ref 98–111)
Creatinine, Ser: 0.75 mg/dL (ref 0.61–1.24)
GFR, Estimated: >60 mL/min (ref >60)
Glucose, Bld: 87 mg/dL (ref 70–99)
Potassium: 3.4 mmol/L — ABNORMAL LOW (ref 3.5–5.1)
Sodium: 139 mmol/L (ref 135–145)
Total Bilirubin: 1.3 mg/dL — ABNORMAL HIGH (ref 0.3–1.2)
Total Protein: 5.2 g/dL — ABNORMAL LOW (ref 6.5–8.1)

## 2021-11-21 LAB — URINALYSIS, COMPLETE (UACMP) WITH MICROSCOPIC
Bilirubin Urine: NEGATIVE
Glucose, UA: NEGATIVE mg/dL
Ketones, ur: NEGATIVE mg/dL
Leukocytes,Ua: NEGATIVE
Nitrite: NEGATIVE
Protein, ur: NEGATIVE mg/dL
Specific Gravity, Urine: 1.013 (ref 1.005–1.030)
pH: 8 (ref 5.0–8.0)

## 2021-11-21 LAB — PHOSPHORUS: Phosphorus: 2 mg/dL — ABNORMAL LOW (ref 2.5–4.6)

## 2021-11-21 LAB — MAGNESIUM: Magnesium: 1.5 mg/dL — ABNORMAL LOW (ref 1.7–2.4)

## 2021-11-21 MED ORDER — INFLUENZA VAC A&B SA ADJ QUAD 0.5 ML IM PRSY
0.5000 mL | PREFILLED_SYRINGE | INTRAMUSCULAR | Status: AC
  Administered 2021-11-23: 0.5 mL via INTRAMUSCULAR
  Filled 2021-11-21: qty 0.5

## 2021-11-21 MED ORDER — TRAZODONE HCL 50 MG PO TABS
50.0000 mg | ORAL_TABLET | Freq: Every day | ORAL | Status: DC
  Administered 2021-11-21: 21:00:00 50 mg via ORAL
  Filled 2021-11-21: qty 1

## 2021-11-21 MED ORDER — LIP MEDEX EX OINT
TOPICAL_OINTMENT | CUTANEOUS | Status: AC
  Filled 2021-11-21: qty 7

## 2021-11-21 NOTE — Progress Notes (Signed)
Marcus Fuller  HEMATOLOGY/ONCOLOGY INPATIENT PROGRESS NOTE  Date of Service: 11/21/2021  Inpatient Attending: .Kayleen Memos, DO   SUBJECTIVE  I met Marcus Fuller with his wife and son at bedside.  His wife notes he has been sleeping most of the day but was quite awake and animated when I was in the room and was trying to carry out a conversation with humor.  Still mostly bedbound. Limited p.o. intake. Denies any uncontrolled pain or other symptoms. CT head with and without contrast showed no acute brain lesions.  MRI of the brain has been ordered and is currently pending. Work-up thus far did not show any urinary tract infection or obvious pneumonia. X-ray of the right foot showed no overt fractures. His delirium appears to be somewhat better today but it continues to fluctuate through the day and between days. Appears to be well-hydrated.   OBJECTIVE:  NAD  PHYSICAL EXAMINATION: . Vitals:   11/20/21 1345 11/20/21 2048 11/21/21 0446 11/21/21 1013  BP: 118/76 130/69 (!) 135/117   Pulse: 90 68 (!) 102 84  Resp: '17 16 16   ' Temp: 97.7 F (36.5 C) 97.9 F (36.6 C) (!) 97.5 F (36.4 C)   TempSrc: Oral Oral Oral   SpO2: 96% 97% 95%   Weight:       Filed Weights   11/20/21 0500  Weight: 168 lb 10.4 oz (76.5 kg)   .Body mass index is 23.52 kg/m.  GENERAL:alert, in no acute distress and comfortable EYES: normal, conjunctiva are pink and non-injected, sclera clear OROPHARYNX:MMM NECK: supple LYMPH:  no palpable lymphadenopathy in the cervical, axillary or inguinal LUNGS: clear to auscultation with normal respiratory effort HEART: regular rate & rhythm,  no murmurs and 1+ lower extremity edema ABDOMEN: abdomen soft, non-tender, normoactive bowel sounds  Musculoskeletal: no cyanosis of digits and no clubbing  PSYCH: alert & oriented x 3 with fluent speech NEURO: no focal motor/sensory deficits  MEDICAL HISTORY:  Past Medical History:  Diagnosis Date   Anxiety    Cataract     beginning stages   Chronic low back pain 07/03/2019   Coronary artery disease    stent- 2007    History of kidney stones    Hypertension    Parkinson's disease ([place]) 01/29/2018   Presence of permanent cardiac pacemaker    2017    Tremor of right hand     SURGICAL HISTORY: Past Surgical History:  Procedure Laterality Date   APPENDECTOMY     bbb     CARDIOVERSION N/A 04/19/2021   Procedure: CARDIOVERSION;  Surgeon: Lelon Perla, MD;  Location: [facility];  Service: Cardiovascular;  Laterality: N/A;   CHOLECYSTECTOMY N/A 05/21/2018   Procedure: LAPAROSCOPIC CHOLECYSTECTOMY;  Surgeon: Coralie Keens, MD;  Location: [facility];  Service: General;  Laterality: N/A;   left knee arthroscopy   [place]  11/2016   STENT PLACEMENT VASCULAR ([place] [facility])  2007   TONSILLECTOMY      SOCIAL HISTORY: Social History   Socioeconomic History   Marital status: Married    Spouse name: Cecelia   Number of children: 2   Years of education: 14   Highest education level: Not on file  Occupational History   Not on file  Tobacco Use   Smoking status: Never   Smokeless tobacco: Never  Vaping Use   Vaping Use: Never used  Substance and Sexual Activity   Alcohol use: Not Currently    Comment: social    Drug use:  No   Sexual activity: Not Currently  Other Topics Concern   Not on file  Social History Narrative   Lives w/ wife   Caffeine use: sometimes   Right handed    Social Determinants of Health   Financial Resource Strain: Not on file  Food Insecurity: Not on file  Transportation Needs: Not on file  Physical Activity: Not on file  Stress: Not on file  Social Connections: Not on file  Intimate Partner Violence: Not on file    FAMILY HISTORY: Family History  Problem Relation Age of Onset   Heart disease Mother    Diabetes Mother    Cancer Mother        breast   Heart disease Father    Heart attack Father    Atrial fibrillation Sister    Heart disease  Brother    Pancreatic disease Brother    Tremor Brother    Heart attack Paternal Grandfather    Spina bifida Brother     ALLERGIES:  is allergic to atorvastatin and codeine.  MEDICATIONS:  Scheduled Meds:  apixaban  5 mg Oral BID   carbidopa-levodopa  1 tablet Oral QAC breakfast   And   carbidopa-levodopa  2 tablet Oral QPM   digoxin  125 mcg Oral Once per day on Sun Tue Thu Sat   [START ON 11/22/2021] influenza vaccine adjuvanted  0.5 mL Intramuscular Tomorrow-1000   levETIRAcetam  500 mg Oral BID   thiamine injection  100 mg Intravenous Daily   traZODone  50 mg Oral QHS   Continuous Infusions:  dextrose 5% lactated ringers 100 mL/hr at 11/20/21 2121   PRN Meds:.acetaminophen **OR** acetaminophen, acetaminophen **OR** acetaminophen, LORazepam, ondansetron **OR** ondansetron (ZOFRAN) IV  REVIEW OF SYSTEMS:    10 Point review of Systems was done is negative except as noted above.  LABORATORY DATA:  I have reviewed the data as listed  . CBC Latest Ref Rng & Units 11/20/2021 11/19/2021 10/21/2021  WBC 4.0 - 10.5 K/uL 4.6 5.0 4.6  Hemoglobin 13.0 - 17.0 g/dL 12.3(L) 13.0 15.2  Hematocrit 39.0 - 52.0 % 37.6(L) 40.1 47.7  Platelets 150 - 400 K/uL 142(L) 163 57(L)    CMP Latest Ref Rng & Units 11/21/2021 11/20/2021 11/19/2021  Glucose 70 - 99 mg/dL 87 88 106(H)  BUN 8 - 23 mg/dL '11 16 18  ' Creatinine 0.61 - 1.24 mg/dL 0.75 0.79 0.83  Sodium 135 - 145 mmol/L 139 138 141  Potassium 3.5 - 5.1 mmol/L 3.4(L) 3.2(L) 3.8  Chloride 98 - 111 mmol/L 106 107 108  CO2 22 - 32 mmol/L '26 24 23  ' Calcium 8.9 - 10.3 mg/dL 8.2(L) 8.3(L) 8.7(L)  Total Protein 6.5 - 8.1 g/dL 5.2(L) 5.0(L) 5.6(L)  Total Bilirubin 0.3 - 1.2 mg/dL 1.3(H) 1.4(H) 0.9  Alkaline Phos 38 - 126 U/L 97 84 118  AST 15 - 41 U/L '24 21 16  ' ALT 0 - 44 U/L '9 10 6     ' RADIOGRAPHIC STUDIES: I have personally reviewed the radiological images as listed and agreed with the findings in the report. CT HEAD W & WO CONTRAST  (5MM)  Result Date: 11/19/2021 CLINICAL DATA:  Metastatic disease evaluation EXAM: CT HEAD WITHOUT AND WITH CONTRAST TECHNIQUE: Contiguous axial images were obtained from the base of the skull through the vertex without and with intravenous contrast CONTRAST:  118m OMNIPAQUE IOHEXOL 350 MG/ML SOLN COMPARISON:  Brain MRI 09/24/2021 FINDINGS: Brain: Unchanged 9 mm meningioma of the right middle cranial fossa. The enhancing  metastases shown on the brain MRI of 09/24/2021 are not visible on this study. There is generalized atrophy without lobar predilection. Hypodensity of the white matter is most commonly associated with chronic microvascular disease. Vascular: No abnormal hyperdensity of the major intracranial arteries or dural venous sinuses. No intracranial atherosclerosis. Skull: The visualized skull base, calvarium and extracranial soft tissues are normal. Sinuses/Orbits: No fluid levels or advanced mucosal thickening of the visualized paranasal sinuses. No mastoid or middle ear effusion. The orbits are normal. IMPRESSION: Unchanged 9 mm right middle cranial fossa meningioma. The enhancing metastases shown on the brain MRI of 09/24/2021 are not visible on this study. Electronically Signed   By: Ulyses Jarred M.D.   On: 11/19/2021 21:42   CT LUMBAR SPINE W CONTRAST  Result Date: 11/19/2021 CLINICAL DATA:  Metastatic disease to the right hemipelvis. Follow-up study. EXAM: CT LUMBAR SPINE WITH CONTRAST TECHNIQUE: Multidetector CT imaging of the lumbar spine was performed with intravenous contrast administration. CONTRAST:  177m OMNIPAQUE IOHEXOL 350 MG/ML SOLN COMPARISON:  CT chest, abdomen and pelvis 09/03/2021. FINDINGS: Segmentation: 5 lumbar type vertebrae. Alignment: There is mild lumbar dextroscoliosis apex at L2 and again noted minimal grade 1 retrolisthesis at L3-4. No new or worsening alignment abnormality is seen. Vertebrae: There is osteopenia. No acute spinal compression fracture or destructive  lumbar bone lesion is seen. There is mild chronic wedging of the L1 vertebral body. There is moderate marginal osteophytosis at the upper 3 lumbar levels, less prominent marginal osteophytes at the lower 2 levels. Paraspinal and other soft tissues: There is aortoiliac atherosclerosis and abdominal aortic tortuosity without AAA. No paraspinal mass is seen. A small left renal cyst is again noted. There is no left renal calculus or hydronephrosis with the right kidney not included in the imaging. Disc levels: T11-12 and T12-L1: Not well seen due to artifact from the patient's arms in the field but grossly unremarkable. L1-2: There is a normal disc height. No bulge, herniation or stenosis is seen. L2-3: No bulge, herniation or stenosis. L3-4: Stable grade 1 retrolisthesis. There is preservation of the normal disc height. Due to a posterior disc bulge and ligamentous thickening there is mild spinal canal and lateral recess stenosis. Due to facet spurring there is mild-to-moderate right foraminal stenosis. The left foramen is clear. L4-5: There is preservation of the normal disc height. Broad posterior disc bulge causes slight spinal canal AP stenosis and lateral recess stenosis. Right greater than left facet hypertrophy is present and there is moderate right, mild left foraminal stenosis. L5-S1: There is slight disc space loss with vacuum phenomenon. There is a nonstenosing posterior disc bulge without herniation. Right greater than left facet hypertrophy again noted with moderate to severe right, mild left foraminal stenosis. Other: Expansile destructive lesion of the posterior right iliac wing abutting the SI joint interface is again shown. It is not fully imaged but the visualized portion is larger than previously. A pathologic hairline fracture of the anterior aspect of the lesion is present on the lowest slices. Maximum measured AP and transverse axis on the current exam are 4.2 x 6.4 cm, on prior scan 3.6 x 6.1 cm.  IMPRESSION: 1. 4.2 x 6.4 cm destructive expansile lesion of the posterior right iliac wing is slightly larger than on the 09/03/2021 CT. There is pathologic fracture through the anterior cortex. 2. MRI of 08/03/2021 demonstrated 2 small foci of signal abnormality in the L2 and 3 vertebral bodies, but these are not visible on noncontrast CT. 3. Osteopenia, scoliosis  and degenerative changes described above, stable. 4. Aortic atherosclerosis. Electronically Signed   By: Telford Nab M.D.   On: 11/19/2021 22:08   DG CHEST PORT 1 VIEW  Result Date: 11/19/2021 CLINICAL DATA:  Encephalopathy EXAM: PORTABLE CHEST 1 VIEW COMPARISON:  None. FINDINGS: The heart size and mediastinal contours are within normal limits. Both lungs are clear. The visualized skeletal structures are unremarkable. IMPRESSION: No active disease. Electronically Signed   By: Ulyses Jarred M.D.   On: 11/19/2021 19:48   DG Foot Complete Right  Result Date: 11/19/2021 CLINICAL DATA:  Recent fall EXAM: RIGHT FOOT COMPLETE - 3+ VIEW COMPARISON:  None. FINDINGS: There is no evidence of fracture or dislocation. There is no evidence of arthropathy or other focal bone abnormality. Soft tissues are unremarkable. IMPRESSION: Negative. Electronically Signed   By: Ulyses Jarred M.D.   On: 11/19/2021 19:49    ASSESSMENT & PLAN:   80 year old male with history of Parkinson's disease, coronary artery disease, atrial fibrillation on anticoagulation with   1) Recently diagnosed metastatic lung adenocarcinoma with bone mets and brain metastases in the right frontal lobe EGFR mutation positive PD-L1 70% positive Neoplasm related pain is in his right posterior hip and upper thigh PET CT scan from 09/10/2021 showed Hypermetabolic solid nodule of the right lung apex measuring 12 mm compatible with primary lung malignancy.Hypermetabolic lytic lesions of the right ilium concerning for osseous metastatic disease.  Hypermetabolic focus which correlates with  the right C2-C3 facet with no lytic lesion visualized, possibly due to degenerative disease.  MRI of the brain with and without contrast on 09/13/2021 which showed Three metastatic deposits in the brain in the right frontal lobe. Mild associated edema. 9 mm meningioma right middle cranial fossa. Atrophy and chronic microvascular ischemic change. S/p SRS to brain lesions in the right frontal lobe 10/22/2021  2) L2 and L3 lesions concerning for bone metastases. 3) history of Parkinson's disease on Sinemet follows with Dr. Margette Fast 4) coronary artery disease status post PCI 2007 atrial fibrillation status post pacemaker placement on anticoagulation and digoxin follows with Dr. Sallyanne Kuster 5) hypertension 6) dyslipidemia  7) Altered Mental status- worsening Delirium PLAN -Patient was admitted from the oncology clinic for altered mental status with worsening hyperactive delirium to evaluate for reversible causes of delirium. -He has thus far not had any findings of UTI or pneumonia or other signs of infection. -CT of the head did not show any acute intracranial lesions or bleeds.  MRI of the brain has been ordered by Dr. [place] Crane and is currently pending. -Patient's mental status appears to have cleared at this afternoon when I saw him with his wife and son at bedside.  His mental status however tends to wax and wane. We discussed that he has had multiple reasons for cognitive changes including frontal lobe lesions, radiation, Parkinson's disease, medications, possible seizure for which he has been started on Keppra by Dr. Mickeal Skinner. -I spent significant period time discussing again goals of care with the patient and his wife and son.  His care needs have increased significantly and the family is having difficulty taking care of him even though he has home care services. -They have held his [place] at this time in the setting of altered mental status. -We discussed that the 2 options would be to continue  [place] and take a rehabilitative approach which might mean admitting him to a skilled nursing facility to see if he might improve his functional status.  I noted that it might  be challenging for him to work with therapies given his audible medical comorbidities and cognitive changes.  Thus far home physical therapy has not made much progress. -The other option would be to consider holding off any cancer directed therapies and taking a best supportive care approach through hospice either at home or in institutional hospice. -Outpatient palliative care team has been involved and following the patient as well. -I appreciate help from Dr. Nevada Crane and her excellent care. -Oncology will continue to follow to help facilitate goals of care discussion and discharge planning.  I spent 30 minutes counseling the patient face to face. The total time spent in the appointment was 40 minutes and more than 50% was on counseling and direct patient cares.    Sullivan Lone MD MS AAHIVMS [facility] Hematology/Oncology Physician [place] 11/21/2021 2:16 PM

## 2021-11-21 NOTE — Progress Notes (Signed)
PROGRESS NOTE  Marcus Fuller Marcus Fuller DOB: 1941-03-02 DOA: 11/19/2021 PCP: Cari Caraway, MD  HPI/Recap of past 24 hours: Marcus Fuller is a 80 y.o. 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  Etiology of his confusion is unclear at this time.  CT head and chest x-ray were nonacute.  UA, MRI brain, and EEG are pending.  MRI brain will be completed at [facility] on 11/22/21.  11/21/2021: Seen and examined at his bedside.  He is calm this morning.  Delirium overnight with some agitation.  Assessment/Plan: Principal Problem:   Acute metabolic encephalopathy Active Problems:   Parkinson disease (HCC)   Pacemaker   Coronary artery disease   AF (paroxysmal atrial fibrillation) (HCC)   Brain metastases (HCC)   Metastatic adenocarcinoma (HCC)   (HFpEF) heart failure with preserved ejection fraction (HCC)   Lower extremity weakness   Toe trauma   AMS (altered mental status)   * Acute metabolic encephalopathy, unclear etiology Broad ddx at this point, infectious vs metastatic cancer vs seizure vs medication, etc Chest x-ray and CT head nonacute. Continue to follow UA and urine culture. Continue to follow EEG and MRI brain. Continue to reorient as needed Nonpharmacological management of delirium Regulate sleep and wake cycle Out of bed to chair during the day  Brain metastases ([place]) MRI 09/25/21 with 3 unchanged mets in right frontal lobe Will follow CT head with/without contrast Follows with Dr. Irene Limbo and Dr. Mickeal Skinner, unable to reach them today to discuss care, will reach out again tomorrow Concern for complex partial seizures in 11/09/21 note from neuro oncology Continue keppra 500 mg BID (follow dose, med rec pending - different from [place] note and meds  in chart) EEG as above Follow-up MRI brain   Metastatic adenocarcinoma ([place]) Metastatic lung adenocarcinoma with bone mets and brain metastases Was on tagrisso, but has been holding this at least past 5 days or so per discussion with family  S/p SRS with radiation oncology Completed steroid taper, appears on 11/14 Will discuss with Dr. Irene Limbo   Lower extremity weakness Hasn't walked in well over a month MRI 8/17 with expansile lesion in R iliac bone concerning for malignancy, 2 small foci of signal abnormality in L2 and L3 vertebral bodies concerning for additional small mets Follow CT L spine with/without contrast Follow-up MRI brain PT OT assessment Fall precautions   (HFpEF) heart failure with preserved ejection fraction ([place]) Euvolemic on exam Echo 2019 with EF 50-55% Strict I's and O's and daily weight   Coronary artery disease Denies having any anginal symptoms.   Pacemaker Noted, will [facility] need [facility]   Parkinson disease (HCC) Sinemet, dose increased per vaslow Continue dose prescribed for Dr. Jannifer Franklin and follow   AF (paroxysmal atrial fibrillation) (HCC) Digoxin, eliquis   Toe trauma Right foot x-ray no fractures. As needed analgesics   Code Status: Full code  Family Communication: Updated his wife via phone.  Disposition Plan: Likely will discharge to home once medical/neuro oncology sign off.   Consultants: Medical oncology Neuro-oncology  Procedures: EEG ordered, pending  Antimicrobials: None  DVT prophylaxis: Eliquis  Status is: Inpatient  Inpatient status.  Patient will require at least 2 midnights for further evaluation and treatment of present condition.      Objective: Vitals:   11/20/21 1345  11/20/21 2048 11/21/21 0446 11/21/21 1013  BP: 118/76 130/69 (!) 135/117   Pulse: 90 68 (!) 102 84  Resp: 17 16 16    Temp: 97.7 F (36.5 C) 97.9 F (36.6 C) (!) 97.5 F (36.4 C)   TempSrc: Oral Oral Oral   SpO2:  96% 97% 95%   Weight:        Intake/Output Summary (Last 24 hours) at 11/21/2021 1402 Last data filed at 11/21/2021 1200 Gross per 24 hour  Intake 340 ml  Output 200 ml  Net 140 ml   Filed Weights   11/20/21 0500  Weight: 76.5 kg    Exam: No significant changes from prior exam.  General: 80 y.o. year-old male well developed well nourished in no acute distress.  Alert and pleasantly confused. Cardiovascular: Regular rate and rhythm with no rubs or gallops.  No thyromegaly or JVD noted.   Respiratory: Clear to auscultation with no wheezes or rales. Good inspiratory effort. Abdomen: Soft nontender nondistended with normal bowel sounds x4 quadrants. Musculoskeletal: No lower extremity edema. 2/4 pulses in all 4 extremities. Skin: Bruising affecting right toes. Psychiatry: Mood is appropriate for condition and setting   Data Reviewed: CBC: Recent Labs  Lab 11/19/21 1008 11/20/21 0642  WBC 5.0 4.6  NEUTROABS 3.6 3.1  HGB 13.0 12.3*  HCT 40.1 37.6*  MCV 89.3 90.0  PLT 163 115*   Basic Metabolic Panel: Recent Labs  Lab 11/19/21 1008 11/20/21 0642 11/21/21 0608  NA 141 138 139  K 3.8 3.2* 3.4*  CL 108 107 106  CO2 23 24 26   GLUCOSE 106* 88 87  BUN 18 16 11   CREATININE 0.83 0.79 0.75  CALCIUM 8.7* 8.3* 8.2*  MG  --  1.6* 1.5*  PHOS  --  2.0* 2.0*   GFR: Estimated Creatinine Clearance: 79.7 mL/min (by C-G formula based on SCr of 0.75 mg/dL). Liver Function Tests: Recent Labs  Lab 11/19/21 1008 11/20/21 0642 11/21/21 0608  AST 16 21 24   ALT 6 10 9   ALKPHOS 118 84 97  BILITOT 0.9 1.4* 1.3*  PROT 5.6* 5.0* 5.2*  ALBUMIN 2.7* 2.6* 2.7*   No results for input(s): LIPASE, AMYLASE in the last 168 hours. Recent Labs  Lab 11/19/21 1852  AMMONIA 18   Coagulation Profile: No results for input(s): INR, PROTIME in the last 168 hours. Cardiac Enzymes: No results for input(s): CKTOTAL, CKMB, CKMBINDEX, TROPONINI in the last 168 hours. BNP (last 3 results) No  results for input(s): PROBNP in the last 8760 hours. HbA1C: No results for input(s): HGBA1C in the last 72 hours. CBG: No results for input(s): GLUCAP in the last 168 hours. Lipid Profile: No results for input(s): CHOL, HDL, LDLCALC, TRIG, CHOLHDL, LDLDIRECT in the last 72 hours. Thyroid Function Tests: Recent Labs    11/19/21 1852  TSH 4.455   Anemia Panel: Recent Labs    11/20/21 0642  VITAMINB12 288  FOLATE 9.2   Urine analysis:    Component Value Date/Time   COLORURINE YELLOW 11/21/2021 1233   APPEARANCEUR CLEAR 11/21/2021 1233   APPEARANCEUR Clear 06/20/2018 1122   LABSPEC 1.013 11/21/2021 1233   PHURINE 8.0 11/21/2021 1233   GLUCOSEU NEGATIVE 11/21/2021 1233   HGBUR SMALL (A) 11/21/2021 1233   BILIRUBINUR NEGATIVE 11/21/2021 1233   BILIRUBINUR Negative 06/20/2018 1122   KETONESUR NEGATIVE 11/21/2021 1233   PROTEINUR NEGATIVE 11/21/2021 1233   NITRITE NEGATIVE 11/21/2021 1233   LEUKOCYTESUR NEGATIVE 11/21/2021 1233   Sepsis Labs: @LABRCNTIP (procalcitonin:4,lacticidven:4)  ) Recent Results (from  the past 240 hour(s))  Resp Panel by RT-PCR (Flu A&B, Covid) Nasopharyngeal Swab     Status: None   Collection Time: 11/19/21 11:12 PM   Specimen: Nasopharyngeal Swab; Nasopharyngeal(NP) swabs in vial transport medium  Result Value Ref Range Status   SARS Coronavirus 2 by RT PCR NEGATIVE NEGATIVE Final    Comment: (NOTE) SARS-CoV-2 target nucleic acids are NOT DETECTED.  The SARS-CoV-2 RNA is generally detectable in upper respiratory specimens during the acute phase of infection. The lowest concentration of SARS-CoV-2 viral copies this assay can detect is 138 copies/mL. A negative result does not preclude SARS-Cov-2 infection and should not be used as the sole basis for treatment or other patient management decisions. A negative result may occur with  improper specimen collection/handling, submission of specimen other than nasopharyngeal swab, presence of viral  mutation(s) within the areas targeted by this assay, and inadequate number of viral copies(<138 copies/mL). A negative result must be combined with clinical observations, patient history, and epidemiological information. The expected result is Negative.  Fact Sheet for Patients:  EntrepreneurPulse.com.au  Fact Sheet for Healthcare Providers:  IncredibleEmployment.be  This test is no t yet approved or cleared by the [place] FDA and  has been authorized for detection and/or diagnosis of SARS-CoV-2 by FDA under an Emergency Use Authorization (EUA). This EUA will remain  in effect (meaning this test can be used) for the duration of the COVID-19 declaration under Section 564(b)(1) of the Act, 21 U.S.C.section 360bbb-3(b)(1), unless the authorization is terminated  or revoked sooner.       Influenza A by PCR NEGATIVE NEGATIVE Final   Influenza B by PCR NEGATIVE NEGATIVE Final    Comment: (NOTE) The Xpert Xpress SARS-CoV-2/FLU/RSV plus assay is intended as an aid in the diagnosis of influenza from Nasopharyngeal swab specimens and should not be used as a sole basis for treatment. Nasal washings and aspirates are unacceptable for Xpert Xpress SARS-CoV-2/FLU/RSV testing.  Fact Sheet for Patients: EntrepreneurPulse.com.au  Fact Sheet for Healthcare Providers: IncredibleEmployment.be  This test is not yet approved or cleared by the [place] FDA and has been authorized for detection and/or diagnosis of SARS-CoV-2 by FDA under an Emergency Use Authorization (EUA). This EUA will remain in effect (meaning this test can be used) for the duration of the COVID-19 declaration under Section 564(b)(1) of the Act, 21 U.S.C. section 360bbb-3(b)(1), unless the authorization is terminated or revoked.  Performed at [facility], [place] [address], [place], [place] [address]       Studies: No  results found.  Scheduled Meds:  apixaban  5 mg Oral BID   carbidopa-levodopa  1 tablet Oral QAC breakfast   And   carbidopa-levodopa  2 tablet Oral QPM   digoxin  125 mcg Oral Once per day on Sun Tue Thu Sat   [START ON 11/22/2021] influenza vaccine adjuvanted  0.5 mL Intramuscular Tomorrow-1000   levETIRAcetam  500 mg Oral BID   thiamine injection  100 mg Intravenous Daily    Continuous Infusions:  dextrose 5% lactated ringers 100 mL/hr at 11/20/21 2121     LOS: 2 days     Kayleen Memos, MD [facility] Pager [phone]  If 7PM-7AM, please contact night-coverage [email or web address] Password [email or web address] 11/21/2021, 2:02 PM

## 2021-11-22 ENCOUNTER — Inpatient Hospital Stay (HOSPITAL_COMMUNITY)
Admission: AD | Admit: 2021-11-22 | Discharge: 2021-11-22 | Disposition: A | Discharge status: Home / Self Care | Payer: Medicare Other | Source: Ambulatory Visit | Attending: Family Medicine | Admitting: Family Medicine

## 2021-11-22 DIAGNOSIS — G9341 Metabolic encephalopathy: Secondary | ICD-10-CM | POA: Diagnosis not present

## 2021-11-22 DIAGNOSIS — Z7189 Other specified counseling: Secondary | ICD-10-CM | POA: Diagnosis not present

## 2021-11-22 DIAGNOSIS — C799 Secondary malignant neoplasm of unspecified site: Secondary | ICD-10-CM | POA: Diagnosis not present

## 2021-11-22 DIAGNOSIS — C7931 Secondary malignant neoplasm of brain: Secondary | ICD-10-CM | POA: Diagnosis not present

## 2021-11-22 LAB — URINE CULTURE: Culture: <10000 — AB

## 2021-11-22 LAB — CBC
HCT: 38 % — ABNORMAL LOW (ref 39.0–52.0)
Hemoglobin: 12.7 g/dL — ABNORMAL LOW (ref 13.0–17.0)
MCH: 29.8 pg (ref 26.0–34.0)
MCHC: 33.4 g/dL (ref 30.0–36.0)
MCV: 89.2 fL (ref 80.0–100.0)
Platelets: 152 10*3/uL (ref 150–400)
RBC: 4.26 MIL/uL (ref 4.22–5.81)
RDW: 18.5 % — ABNORMAL HIGH (ref 11.5–15.5)
WBC: 5.9 10*3/uL (ref 4.0–10.5)
nRBC: 0 % (ref 0.0–0.2)

## 2021-11-22 LAB — COMPREHENSIVE METABOLIC PANEL
ALT: 6 U/L (ref 0–44)
AST: 20 U/L (ref 15–41)
Albumin: 2.6 g/dL — ABNORMAL LOW (ref 3.5–5.0)
Alkaline Phosphatase: 91 U/L (ref 38–126)
Anion gap: 5 (ref 5–15)
BUN: 9 mg/dL (ref 8–23)
CO2: 22 mmol/L (ref 22–32)
Calcium: 8.2 mg/dL — ABNORMAL LOW (ref 8.9–10.3)
Chloride: 108 mmol/L (ref 98–111)
Creatinine, Ser: 0.61 mg/dL (ref 0.61–1.24)
GFR, Estimated: >60 mL/min (ref >60)
Glucose, Bld: 104 mg/dL — ABNORMAL HIGH (ref 70–99)
Potassium: 3.3 mmol/L — ABNORMAL LOW (ref 3.5–5.1)
Sodium: 135 mmol/L (ref 135–145)
Total Bilirubin: 1.6 mg/dL — ABNORMAL HIGH (ref 0.3–1.2)
Total Protein: 5.1 g/dL — ABNORMAL LOW (ref 6.5–8.1)

## 2021-11-22 LAB — PHOSPHORUS: Phosphorus: 1.8 mg/dL — ABNORMAL LOW (ref 2.5–4.6)

## 2021-11-22 LAB — KAPPA/LAMBDA LIGHT CHAINS
Kappa free light chain: 44.7 mg/L — ABNORMAL HIGH (ref 3.3–19.4)
Kappa, lambda light chain ratio: 1.69 — ABNORMAL HIGH (ref 0.26–1.65)
Lambda free light chains: 26.4 mg/L — ABNORMAL HIGH (ref 5.7–26.3)

## 2021-11-22 LAB — URIC ACID: Uric Acid, Serum: 5.5 mg/dL (ref 3.7–8.6)

## 2021-11-22 LAB — MAGNESIUM: Magnesium: 2 mg/dL (ref 1.7–2.4)

## 2021-11-22 MED ORDER — POTASSIUM CHLORIDE CRYS ER 20 MEQ PO TBCR
40.0000 meq | EXTENDED_RELEASE_TABLET | Freq: Once | ORAL | Status: AC
  Administered 2021-11-22: 40 meq via ORAL
  Filled 2021-11-22: qty 2

## 2021-11-22 MED ORDER — PHENAZOPYRIDINE HCL 200 MG PO TABS
200.0000 mg | ORAL_TABLET | Freq: Three times a day (TID) | ORAL | Status: DC
  Administered 2021-11-22 – 2021-11-23 (×2): 200 mg via ORAL
  Filled 2021-11-22 (×3): qty 1

## 2021-11-22 MED ORDER — HYDROMORPHONE HCL 1 MG/ML IJ SOLN
0.5000 mg | INTRAMUSCULAR | Status: DC | PRN
  Filled 2021-11-22: qty 0.5

## 2021-11-22 MED ORDER — CARBIDOPA-LEVODOPA 25-250 MG PO TABS
2.0000 | ORAL_TABLET | Freq: Every evening | ORAL | Status: DC
  Administered 2021-11-22 – 2021-11-24 (×3): 2 via ORAL
  Filled 2021-11-22 (×4): qty 2

## 2021-11-22 MED ORDER — SENNOSIDES-DOCUSATE SODIUM 8.6-50 MG PO TABS
2.0000 | ORAL_TABLET | Freq: Two times a day (BID) | ORAL | Status: DC
  Administered 2021-11-22 – 2021-11-23 (×3): 2 via ORAL
  Filled 2021-11-22 (×3): qty 2

## 2021-11-22 MED ORDER — HYDROMORPHONE HCL 1 MG/ML IJ SOLN
1.0000 mg | INTRAMUSCULAR | Status: DC | PRN
  Administered 2021-11-22 – 2021-11-24 (×2): 1 mg via INTRAVENOUS
  Filled 2021-11-22 (×2): qty 1

## 2021-11-22 MED ORDER — TRAZODONE HCL 50 MG PO TABS
50.0000 mg | ORAL_TABLET | Freq: Every day | ORAL | Status: DC
  Administered 2021-11-22 – 2021-11-23 (×2): 50 mg via ORAL
  Filled 2021-11-22 (×2): qty 1

## 2021-11-22 MED ORDER — CARBIDOPA-LEVODOPA 25-250 MG PO TABS
2.0000 | ORAL_TABLET | Freq: Every day | ORAL | Status: DC
  Administered 2021-11-23 – 2021-11-25 (×3): 2 via ORAL
  Filled 2021-11-22 (×4): qty 2

## 2021-11-22 MED ORDER — QUETIAPINE FUMARATE 25 MG PO TABS: 12.5000 mg | ORAL_TABLET | Freq: Two times a day (BID) | ORAL | Status: DC

## 2021-11-22 MED ORDER — QUETIAPINE FUMARATE 25 MG PO TABS
12.5000 mg | ORAL_TABLET | Freq: Two times a day (BID) | ORAL | Status: DC
  Administered 2021-11-22 – 2021-11-25 (×6): 12.5 mg via ORAL
  Filled 2021-11-22 (×6): qty 1

## 2021-11-22 MED ORDER — OXYCODONE HCL 5 MG PO TABS: 5.0000 mg | ORAL_TABLET | Freq: Four times a day (QID) | ORAL | Status: DC | PRN

## 2021-11-22 MED ORDER — HYDRALAZINE HCL 20 MG/ML IJ SOLN: 5.0000 mg | Freq: Four times a day (QID) | INTRAMUSCULAR | Status: DC | PRN

## 2021-11-22 MED ORDER — MAGNESIUM SULFATE 2 GM/50ML IV SOLN
2.0000 g | Freq: Once | INTRAVENOUS | Status: AC
  Administered 2021-11-22: 2 g via INTRAVENOUS
  Filled 2021-11-22: qty 50

## 2021-11-22 MED ORDER — ENSURE ENLIVE PO LIQD
237.0000 mL | Freq: Two times a day (BID) | ORAL | Status: DC
  Administered 2021-11-22 – 2021-11-23 (×2): 237 mL via ORAL

## 2021-11-22 MED ORDER — SODIUM PHOSPHATES 45 MMOLE/15ML IV SOLN
30.0000 mmol | Freq: Once | INTRAVENOUS | Status: AC
  Administered 2021-11-22: 30 mmol via INTRAVENOUS
  Filled 2021-11-22: qty 10

## 2021-11-22 MED ORDER — ADULT MULTIVITAMIN W/MINERALS CH
1.0000 | ORAL_TABLET | Freq: Every day | ORAL | Status: DC
  Administered 2021-11-23: 1 via ORAL
  Filled 2021-11-22 (×2): qty 1

## 2021-11-22 NOTE — Progress Notes (Signed)
Attempted to complete items on nursing admission history. Family members at bedside and pt trying to get out of bed. Pt's wife unable to answer questions at present time.Spoke with pt's rn Cindy and asked her to pass on to night shift that it needs to be completed. I can also come back and do it if family members become available to answer questions.  Lucius Conn BSN, RN-BC Admissions RN 11/22/2021 5:56 PM

## 2021-11-22 NOTE — Progress Notes (Signed)
[facility] picked patient up to bring to [facility] for an MRI. Marcus Fuller has a pacemaker. They brought the patient back within fifteen minutes. They stated he was too agitated and restless to have an MRI and that he couldn't lay still to have it done. They said they called MRI at [facility] and explained what was going on and then returned him to his room. Dr. Nevada Crane was called and informed the MRI was not done.

## 2021-11-22 NOTE — Procedures (Signed)
Patient Name: Marcus Fuller  MRN: [id number]  Epilepsy Attending: Lora Havens  Referring Physician/Provider: Elodia Florence., MD Date: 11/22/2021 Duration: 22.30 mins  Patient history: 80yo M with ams. EEG to evaluate for seizure  Level of alertness: Awake  AEDs during EEG study: LEV  Technical aspects: This EEG study was done with scalp electrodes positioned according to the 10-20 International system of electrode placement. Electrical activity was acquired at a sampling rate of 500Hz  and reviewed with a high frequency filter of 70Hz  and a low frequency filter of 1Hz . EEG data were recorded continuously and digitally stored.   Description: EEG showed continuous generalized 3 to 6 Hz theta-delta slowing. Hyperventilation and photic stimulation were not performed.     Patient was noted to have right upper extremity shaking on 11/22/2021 at 1608. Concomitant eeg before, during and after the event didnw show any eeg change to suggest seizure.   ABNORMALITY - Continuous slow, generalized  IMPRESSION: This study is suggestive of moderate diffuse encephalopathy, nonspecific etiology. No seizures or epileptiform discharges were seen throughout the recording.  Patient was noted to have right upper extremity shaking on 11/22/2021 at 1608 without concomitant eeg change. This was most likely NOT and epileptic event.    Conley Delisle Barbra Sarks

## 2021-11-22 NOTE — Progress Notes (Signed)
Initial Nutrition Assessment  INTERVENTION:   -Ensure Enlive po BID, each supplement provides 350 kcal and 20 grams of protein  -Multivitamin with minerals daily  NUTRITION DIAGNOSIS:   Increased nutrient needs related to chronic illness as evidenced by estimated needs.  GOAL:   Patient will meet greater than or equal to 90% of their needs  MONITOR:   PO intake, Supplement acceptance, Labs, Weight trends, I & O's  REASON FOR ASSESSMENT:   Malnutrition Screening Tool    ASSESSMENT:   80 y.o. 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  Patient currently alert/oriented x 2. Has been agitated today per staff with delirium.  SLP evaluated 12/3, recommended dysphagia 1 diet, feels swallowing impacted by cognitive status.  Will order Ensure supplements given family reports of decreased appetite which started PTA when confusion increased.  Per weight records, pt has lost 24 lbs since 9/29 (12% wt loss x 2 months, significant for time frame).  Medications: KLOR-CON, Senokot, Thiamine, Mg sulfate, Na Phos  Labs reviewed:  Low K, Phos  NUTRITION - FOCUSED PHYSICAL EXAM:  Unable to complete- pt with agitation and delirium.  Diet Order:   Diet Order             DIET - DYS 1 Room service appropriate? Yes; Fluid consistency: Thin  Diet effective now                   EDUCATION NEEDS:   No education needs have been identified at this time  Skin:  Skin Assessment: Reviewed RN Assessment  Last BM:  12/4  Height:   Ht Readings from Last 1 Encounters:  11/19/21 5\' 11"  (1.803 m)    Weight:   Wt Readings from Last 1 Encounters:  11/20/21 76.5 kg    BMI:  Body mass index is 23.52 kg/m.  Estimated Nutritional Needs:   Kcal:  1900-2100  Protein:   90-100g  Fluid:  2L/day  Clayton Bibles, MS, RD, LDN Inpatient Clinical Dietitian Contact information available via Amion

## 2021-11-22 NOTE — Progress Notes (Signed)
Family sitting with patient. Bed alarm on.

## 2021-11-22 NOTE — Progress Notes (Signed)
Patient refuses to keep Telemetry on, he keeps pulling it off. Dr. Nevada Crane aware and has discontinued it.

## 2021-11-22 NOTE — Progress Notes (Signed)
EEG complete - results pending 

## 2021-11-22 NOTE — Progress Notes (Addendum)
PROGRESS NOTE  Marcus Fuller DOB: [birth year] DOA: 11/19/2021 PCP: Cari Caraway, MD  HPI/Recap of past 24 hours: Marcus Fuller is a 80 y.o. 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  Etiology of his confusion is unclear at this time.  CT head and chest x-ray were nonacute.  UA negative for pyuria.  MRI brain and EEG are pending.  MRI brain planned to be completed at [facility] was canceled on 11/22/21 due to delirium.  11/22/2021: Seen and examined at his bedside.  Reported dysuria.  UA was negative for pyuria.  Started on Pyridium.    Ongoing delirium with agitation.  Seroquel 12.5 mg twice daily and trazodone started for delirium 11/22/21.  One-to-one sitter and nonpharmacological delirium precautions in place.  Assessment/Plan: Principal Problem:   Acute metabolic encephalopathy Active Problems:   Parkinson disease (HCC)   Pacemaker   Coronary artery disease   AF (paroxysmal atrial fibrillation) (HCC)   Brain metastases (HCC)   Metastatic adenocarcinoma (HCC)   (HFpEF) heart failure with preserved ejection fraction (HCC)   Lower extremity weakness   Toe trauma   AMS (altered mental status)   Counseling regarding advance care planning and goals of care   *Delirium/acute metabolic encephalopathy secondary to acute illness Continue to treat underlying conditions. Thus far work-up has been negative Ammonia level -18 on 11/19/2021. CT head, chest x-ray, UA unrevealing. MRI brain and EEG are pending. He is re-orientable. Continue nonpharmacological management of delirium. Open blinds during the day, out of bed to chair during the day Reorient frequently. Seroquel 12.5 mg twice daily, trazodone 50 mg nightly. Fall precautions/aspiration  precautions/delirium precautions.  Brain metastases ([place]) MRI 09/25/21 with 3 unchanged mets in right frontal lobe CT head on 11/19/2021 showed unchanged 9 mm right middle cranial fossa meningioma. Follows with Dr. Irene Limbo and Dr. Mickeal Skinner Concern for complex partial seizures in 11/09/21 note from neuro oncology Continue keppra 500 mg BID  MRI brain and EEG are pending. Continue seizure precautions   Metastatic adenocarcinoma ([place]) Metastatic lung adenocarcinoma with bone mets and brain metastases Was on tagrisso, but has been holding this at least past 5 days or so per discussion with family  S/p SRS with radiation oncology Management by medical oncology.   Bilateral lower extremity weakness Hasn't walked in well over a month MRI 08/04/21 with expansile lesion in R iliac bone concerning for malignancy, 2 small foci of signal abnormality in L2 and L3 vertebral bodies concerning for additional small mets Follow CT L spine with/without contrast 11/19/2021 showed 4.2 x 6.4 cm destructive expansile lesion of the posterior right iliac wing is slightly larger than on the 09/03/2021 CT. There is pathologic fracture through the anterior cortex. PT OT assessed and recommended home health PT OT Continue fall precautions.  Electrolyte disturbances: Hypokalemia, hypophosphatemia Serum potassium 3.3 Serum phosphorus 1.8 Repleted orally and intravenously.  Essential hypertension, BP is not at goal. BP is elevated Add IV antihypertensives as needed with parameters. Continue to monitor vital signs  Isolated hyperbilirubinemia- T bilirubin 1.6 from 1.3 AST, ALT, alkaline phosphatase normal Albumin 2.6 Uptrending. Continue to monitor.   (HFpEF) heart failure with preserved ejection fraction ([place]) Euvolemic on exam Echo 2019 with EF 50-55% Strict I's and O's and daily weight   Coronary artery disease Denies having any anginal symptoms.   Pacemaker  Noted, [facility]   Parkinson disease (HCC) Sinemet, dose increased per vaslow Continue dose prescribed for Dr. Jannifer Franklin and follow   AF (paroxysmal atrial fibrillation) (HCC) Digoxin, eliquis   Toe trauma Right foot x-ray no fractures. As needed analgesics    Critical care time: 55 minutes      Code Status: Full code  Family Communication: Updated his wife via phone.  Disposition Plan: Likely will discharge to home once medical/neuro oncology sign off.   Consultants: Medical oncology Neuro-oncology  Procedures: EEG ordered, pending  Antimicrobials: None  DVT prophylaxis: Eliquis  Status is: Inpatient  Inpatient status.  Patient will require at least 2 midnights for further evaluation and treatment of present condition.      Objective: Vitals:   11/21/21 0446 11/21/21 1013 11/21/21 1431 11/21/21 2122  BP: (!) 135/117  127/82 (!) 173/98  Pulse: (!) 102 84 89 80  Resp: 16  18 16   Temp: (!) 97.5 F (36.4 C)  98.2 F (36.8 C) 98.1 F (36.7 C)  TempSrc: Oral   Oral  SpO2: 95%  95% 99%  Weight:        Intake/Output Summary (Last 24 hours) at 11/22/2021 1353 Last data filed at 11/22/2021 2633 Gross per 24 hour  Intake 1482.92 ml  Output --  Net 1482.92 ml   Filed Weights   11/20/21 0500  Weight: 76.5 kg    Exam:  General: 80 y.o. year-old male well-developed well-nourished no acute distress.  He is alert and confused.  Agitated. Cardiovascular: Regular rate and rhythm no rubs or gallops.  No JVD or thyromegaly noted. Respiratory: Clear to auscultation with no wheezes or rales.  Poor inspiratory effort. Abdomen: Soft nontender normal bowel sounds present.  Musculoskeletal: No lower extremity edema bilaterally.   Skin: Bruising affecting right toes Psychiatry: Unable to assess mood due to confusion Neuro: Moves all 4 extremities.   Data Reviewed: CBC: Recent Labs  Lab 11/19/21 1008 11/20/21 0642 11/22/21 0943  WBC 5.0 4.6 5.9  NEUTROABS 3.6 3.1  --    HGB 13.0 12.3* 12.7*  HCT 40.1 37.6* 38.0*  MCV 89.3 90.0 89.2  PLT 163 142* 354   Basic Metabolic Panel: Recent Labs  Lab 11/19/21 1008 11/20/21 0642 11/21/21 0608 11/22/21 0943  NA 141 138 139 135  K 3.8 3.2* 3.4* 3.3*  CL 108 107 106 108  CO2 23 24 26 22   GLUCOSE 106* 88 87 104*  BUN 18 16 11 9   CREATININE 0.83 0.79 0.75 0.61  CALCIUM 8.7* 8.3* 8.2* 8.2*  MG  --  1.6* 1.5* 2.0  PHOS  --  2.0* 2.0* 1.8*   GFR: Estimated Creatinine Clearance: 79.7 mL/min (by C-G formula based on SCr of 0.61 mg/dL). Liver Function Tests: Recent Labs  Lab 11/19/21 1008 11/20/21 0642 11/21/21 0608 11/22/21 0943  AST 16 21 24 20   ALT 6 10 9 6   ALKPHOS 118 84 97 91  BILITOT 0.9 1.4* 1.3* 1.6*  PROT 5.6* 5.0* 5.2* 5.1*  ALBUMIN 2.7* 2.6* 2.7* 2.6*   No results for input(s): LIPASE, AMYLASE in the last 168 hours. Recent Labs  Lab 11/19/21 1852  AMMONIA 18   Coagulation Profile: No results for input(s): INR, PROTIME in the last 168 hours. Cardiac Enzymes: No results for input(s): CKTOTAL, CKMB, CKMBINDEX, TROPONINI in the last 168 hours. BNP (last 3 results) No results for input(s): PROBNP in the last 8760 hours. HbA1C: No results for input(s): HGBA1C in  the last 72 hours. CBG: No results for input(s): GLUCAP in the last 168 hours. Lipid Profile: No results for input(s): CHOL, HDL, LDLCALC, TRIG, CHOLHDL, LDLDIRECT in the last 72 hours. Thyroid Function Tests: Recent Labs    11/19/21 1852  TSH 4.455   Anemia Panel: Recent Labs    11/20/21 0642  VITAMINB12 288  FOLATE 9.2   Urine analysis:    Component Value Date/Time   COLORURINE YELLOW 11/21/2021 1233   APPEARANCEUR CLEAR 11/21/2021 1233   APPEARANCEUR Clear 06/20/2018 1122   LABSPEC 1.013 11/21/2021 1233   PHURINE 8.0 11/21/2021 1233   GLUCOSEU NEGATIVE 11/21/2021 1233   HGBUR SMALL (A) 11/21/2021 1233   BILIRUBINUR NEGATIVE 11/21/2021 1233   BILIRUBINUR Negative 06/20/2018 1122   KETONESUR NEGATIVE  11/21/2021 1233   PROTEINUR NEGATIVE 11/21/2021 1233   NITRITE NEGATIVE 11/21/2021 1233   LEUKOCYTESUR NEGATIVE 11/21/2021 1233   Sepsis Labs: @LABRCNTIP (procalcitonin:4,lacticidven:4)  ) Recent Results (from the past 240 hour(s))  Resp Panel by RT-PCR (Flu A&B, Covid) Nasopharyngeal Swab     Status: None   Collection Time: 11/19/21 11:12 PM   Specimen: Nasopharyngeal Swab; Nasopharyngeal(NP) swabs in vial transport medium  Result Value Ref Range Status   SARS Coronavirus 2 by RT PCR NEGATIVE NEGATIVE Final    Comment: (NOTE) SARS-CoV-2 target nucleic acids are NOT DETECTED.  The SARS-CoV-2 RNA is generally detectable in upper respiratory specimens during the acute phase of infection. The lowest concentration of SARS-CoV-2 viral copies this assay can detect is 138 copies/mL. A negative result does not preclude SARS-Cov-2 infection and should not be used as the sole basis for treatment or other patient management decisions. A negative result may occur with  improper specimen collection/handling, submission of specimen other than nasopharyngeal swab, presence of viral mutation(s) within the areas targeted by this assay, and inadequate number of viral copies(<138 copies/mL). A negative result must be combined with clinical observations, patient history, and epidemiological information. The expected result is Negative.  Fact Sheet for Patients:  EntrepreneurPulse.com.au  Fact Sheet for Healthcare Providers:  IncredibleEmployment.be  This test is no t yet approved or cleared by the [place] FDA and  has been authorized for detection and/or diagnosis of SARS-CoV-2 by FDA under an Emergency Use Authorization (EUA). This EUA will remain  in effect (meaning this test can be used) for the duration of the COVID-19 declaration under Section 564(b)(1) of the Act, 21 U.S.C.section 360bbb-3(b)(1), unless the authorization is terminated  or revoked  sooner.       Influenza A by PCR NEGATIVE NEGATIVE Final   Influenza B by PCR NEGATIVE NEGATIVE Final    Comment: (NOTE) The Xpert Xpress SARS-CoV-2/FLU/RSV plus assay is intended as an aid in the diagnosis of influenza from Nasopharyngeal swab specimens and should not be used as a sole basis for treatment. Nasal washings and aspirates are unacceptable for Xpert Xpress SARS-CoV-2/FLU/RSV testing.  Fact Sheet for Patients: EntrepreneurPulse.com.au  Fact Sheet for Healthcare Providers: IncredibleEmployment.be  This test is not yet approved or cleared by the [place] FDA and has been authorized for detection and/or diagnosis of SARS-CoV-2 by FDA under an Emergency Use Authorization (EUA). This EUA will remain in effect (meaning this test can be used) for the duration of the COVID-19 declaration under Section 564(b)(1) of the Act, 21 U.S.C. section 360bbb-3(b)(1), unless the authorization is terminated or revoked.  Performed at [facility], [place] [address], [place], [place] [address]   Urine Culture     Status: Abnormal  Collection Time: 11/21/21  6:36 AM   Specimen: Urine, Clean Catch  Result Value Ref Range Status   Specimen Description   Final    URINE, CLEAN CATCH Performed at [facility], [place] [address], [place], [place] [address]    Special Requests   Final    NONE Performed at [facility], [place] [address], [place], [place] [address]    Culture (A)  Final    <10,000 COLONIES/mL INSIGNIFICANT GROWTH Performed at [place] [address] [place], [place] [address]    Report Status 11/22/2021 FINAL  Final      Studies: No results found.  Scheduled Meds:  apixaban  5 mg Oral BID   [START ON 11/23/2021] carbidopa-levodopa  2 tablet Oral QAC breakfast   And   carbidopa-levodopa  2 tablet Oral QPM   digoxin  125 mcg Oral Once per day on Sun Tue Thu Sat    influenza vaccine adjuvanted  0.5 mL Intramuscular Tomorrow-1000   levETIRAcetam  500 mg Oral BID   phenazopyridine  200 mg Oral TID WC   QUEtiapine  12.5 mg Oral BID   senna-docusate  2 tablet Oral BID   thiamine injection  100 mg Intravenous Daily   traZODone  50 mg Oral QHS    Continuous Infusions:  dextrose 5% lactated ringers Stopped (11/21/21 0024)   sodium phosphate  Dextrose 5% IVPB 30 mmol (11/22/21 0811)     LOS: 2 days     Kayleen Memos, MD [facility] Pager [phone]  If 7PM-7AM, please contact night-coverage [email or web address] Password [email or web address] 11/22/2021, 1:53 PM

## 2021-11-22 NOTE — Progress Notes (Signed)
PT Cancellation Note  Patient Details Name: Marcus Fuller MRN: [id number] DOB: February 19, 1941   Cancelled Treatment:    Reason Eval/Treat Not Completed: Other (comment) Family reports pt having a "bad" day.  Pt also scheduled for [facility] at 12pm.  Will check back as schedule permits.   Myrtis Hopping [place] 11/22/2021, 12:01 PM Arlyce Dice, DPT Acute Rehabilitation Services Pager: [phone] Office: [phone]

## 2021-11-23 ENCOUNTER — Inpatient Hospital Stay (HOSPITAL_COMMUNITY): Payer: Medicare Other

## 2021-11-23 DIAGNOSIS — S99921A Unspecified injury of right foot, initial encounter: Secondary | ICD-10-CM

## 2021-11-23 LAB — MULTIPLE MYELOMA PANEL, SERUM
Albumin SerPl Elph-Mcnc: 2.6 g/dL — ABNORMAL LOW (ref 2.9–4.4)
Albumin/Glob SerPl: 1.1 (ref 0.7–1.7)
Alpha 1: 0.2 g/dL (ref 0.0–0.4)
Alpha2 Glob SerPl Elph-Mcnc: 0.7 g/dL (ref 0.4–1.0)
B-Globulin SerPl Elph-Mcnc: 0.7 g/dL (ref 0.7–1.3)
Gamma Glob SerPl Elph-Mcnc: 0.8 g/dL (ref 0.4–1.8)
Globulin, Total: 2.4 g/dL (ref 2.2–3.9)
IgA: 139 mg/dL (ref 61–437)
IgG (Immunoglobin G), Serum: 936 mg/dL (ref 603–1613)
IgM (Immunoglobulin M), Srm: 64 mg/dL (ref 15–143)
Total Protein ELP: 5 g/dL — ABNORMAL LOW (ref 6.0–8.5)

## 2021-11-23 LAB — CBC
HCT: 37.2 % — ABNORMAL LOW (ref 39.0–52.0)
Hemoglobin: 12.1 g/dL — ABNORMAL LOW (ref 13.0–17.0)
MCH: 29.7 pg (ref 26.0–34.0)
MCHC: 32.5 g/dL (ref 30.0–36.0)
MCV: 91.2 fL (ref 80.0–100.0)
Platelets: 145 10*3/uL — ABNORMAL LOW (ref 150–400)
RBC: 4.08 MIL/uL — ABNORMAL LOW (ref 4.22–5.81)
RDW: 19.1 % — ABNORMAL HIGH (ref 11.5–15.5)
WBC: 6.3 10*3/uL (ref 4.0–10.5)
nRBC: 0 % (ref 0.0–0.2)

## 2021-11-23 LAB — COMPREHENSIVE METABOLIC PANEL
ALT: 6 U/L (ref 0–44)
AST: 19 U/L (ref 15–41)
Albumin: 2.5 g/dL — ABNORMAL LOW (ref 3.5–5.0)
Alkaline Phosphatase: 84 U/L (ref 38–126)
Anion gap: 6 (ref 5–15)
BUN: 15 mg/dL (ref 8–23)
CO2: 24 mmol/L (ref 22–32)
Calcium: 8.4 mg/dL — ABNORMAL LOW (ref 8.9–10.3)
Chloride: 107 mmol/L (ref 98–111)
Creatinine, Ser: 0.84 mg/dL (ref 0.61–1.24)
GFR, Estimated: >60 mL/min (ref >60)
Glucose, Bld: 103 mg/dL — ABNORMAL HIGH (ref 70–99)
Potassium: 3.8 mmol/L (ref 3.5–5.1)
Sodium: 137 mmol/L (ref 135–145)
Total Bilirubin: 1.3 mg/dL — ABNORMAL HIGH (ref 0.3–1.2)
Total Protein: 4.8 g/dL — ABNORMAL LOW (ref 6.5–8.1)

## 2021-11-23 LAB — PHOSPHORUS: Phosphorus: 4 mg/dL (ref 2.5–4.6)

## 2021-11-23 MED ORDER — POLYVINYL ALCOHOL 1.4 % OP SOLN
1.0000 [drp] | Freq: Four times a day (QID) | OPHTHALMIC | Status: DC | PRN
  Filled 2021-11-23: qty 15

## 2021-11-23 MED ORDER — HALOPERIDOL LACTATE 5 MG/ML IJ SOLN
0.5000 mg | INTRAMUSCULAR | Status: DC | PRN
  Administered 2021-11-24 (×2): 1 mg via INTRAVENOUS
  Filled 2021-11-23 (×2): qty 1

## 2021-11-23 MED ORDER — LORAZEPAM 2 MG/ML IJ SOLN
1.0000 mg | Freq: Two times a day (BID) | INTRAMUSCULAR | Status: DC
  Administered 2021-11-23 – 2021-11-25 (×5): 1 mg via INTRAVENOUS
  Filled 2021-11-23 (×5): qty 1

## 2021-11-23 MED ORDER — LOPERAMIDE HCL 2 MG PO CAPS
2.0000 mg | ORAL_CAPSULE | ORAL | Status: DC | PRN
  Administered 2021-11-23: 2 mg via ORAL
  Filled 2021-11-23: qty 1

## 2021-11-23 MED ORDER — GLYCOPYRROLATE 0.2 MG/ML IJ SOLN
0.2000 mg | INTRAMUSCULAR | Status: DC | PRN
  Administered 2021-11-23: 0.2 mg via INTRAVENOUS
  Filled 2021-11-23: qty 1

## 2021-11-23 MED ORDER — DEXTROSE IN LACTATED RINGERS 5 % IV SOLN: INTRAVENOUS | Status: AC

## 2021-11-23 MED ORDER — BIOTENE DRY MOUTH MT LIQD: 15.0000 mL | OROMUCOSAL | Status: DC | PRN

## 2021-11-23 MED ORDER — ENSURE ENLIVE PO LIQD: 237.0000 mL | Freq: Two times a day (BID) | ORAL | Status: DC | PRN

## 2021-11-23 MED ORDER — DICYCLOMINE HCL 10 MG PO CAPS
10.0000 mg | ORAL_CAPSULE | Freq: Three times a day (TID) | ORAL | Status: DC | PRN
  Administered 2021-11-23: 10 mg via ORAL
  Filled 2021-11-23: qty 1

## 2021-11-23 NOTE — Progress Notes (Signed)
Daily Progress Note   Patient Name: Marcus Fuller       Date: 11/23/2021 DOB: Jan 31, 1941  Age: 80 y.o. MRN#: [id number] Attending Physician: Kayleen Memos, DO Primary Care Physician: Cari Caraway, MD Admit Date: 11/19/2021  Reason for Consultation/Follow-up: Establishing goals of care, Non pain symptom management, Pain control, and Psychosocial/spiritual support  Subjective: Chart Reviewed. Updates Received. Patient Assessed.   Marcus Fuller is asleep, easily awakened. When he does wake he smiles and seems to recognize myself and Jarrett Soho, RN however after falling back to sleep observed some agitation. Pulling at covers, trying to get out the bed, with tachypnea. Able to redirect and calm with touch.   Wife at the bedside and son later arrived. Shares patient has exhibited similar behaviors throughout the day. States he had a rough night requiring his son to stay with him for some comfort. Continued agitation and discomfort. They are appreciative he is somewhat comfortable currently.   We discussed at length goals of care. Dr. Irene Limbo also had a lengthy discussion with family days prior. Discussions had around main focus of treatment and realistic expectations. Marcus Fuller was clear in previous discussions his wishes for DNR/DNI. Family agrees. We discussed continued treatment and work-up with an understanding of no meaningful recovery and continued decline despite all efforts. Family understands patient is beyond the point of rehabilitation. All cancer treatments have been discontinued as patient is not tolerating and continues to have progression.   Marcus Fuller continues to have poor appetite. He has only eaten approximately 25% total over the past week. He may take a few sips when able otherwise as confirmed by family he shows no interest and will turn head or tighten mouth. He continues to require management of symptoms including pain, agitation, and constipation.   Family is clear in expressed goals  their understanding of Marcus Fuller continued decline and would like to focus on his comfort. I provided detailed education regarding hospice and outpatient options. Unfortunately as much as patient would want to be home given his symptom burden need for IV medications around the clock to assist in managing family understands this may not be appropriate. They understand the concept and philosophy of hospice and request consideration for residential hospice to allow him every opportunity to be comfortable and have all of his symptoms managed during what time he has left with a goal of minimal to no suffering. Emotional support provided. Education provided on referral process.   Family agrees for care to focus on comfort while hospitalized. Wife express understanding of minimizing medications, discontinuing all medical interventions not comfort focused, and initiate aggressive symptom management.   All questions answered and support provided.   Length of Stay: 3 days  Vital Signs: BP 119/76 (BP Location: Left Arm)   Pulse 81   Temp 97.9 F (36.6 C) (Oral)   Resp 17   Wt 76.5 kg   SpO2 95%   BMI 23.52 kg/m  SpO2: SpO2: 95 % O2 Device: O2 Device: Room Air O2 Flow Rate:    Physical Exam: Somnolent, chronically-ill appearing, when episodic periods of awake confusion and agitation noted RRR Diminished bilaterally R toe eccymosis                Palliative Care Assessment & Plan    Code Status: DNR  Goals of Care/Recommendations: Detailed discussion with family. They have made decision for all care to focus on comfort. No escalation of care. Will discontinue all interventions, MRI, and minimize medications  Dilaudid  PRN for pain/air hunger/comfort Robinul PRN for excessive secretions Ativan PRN for agitation/anxiety in addition to scheduled for better symptom control Zofran PRN for nausea Liquifilm tears PRN for dry eyes Haldol PRN for agitation/anxiety May have comfort feeding Comfort  cart for family Unrestricted visitations in the setting of EOL (per policy) Oxygen PRN 2L or less for comfort. No escalation.   TOC referral for residential hospice. Family preference is [facility].  PMT will continue to support and follow.     Prognosis: Weeks (2) in the setting of terminal agitation, delirium, poor oral nutrition <25%total intake over the past 7 days, deconditioned, somnolent, rapidly progressing metastatic lung cancer with bone, L2/L3 lesions, lytic lesions, and multiple brain lesions despite treatments, CAD, Parkinson's disease, bilirubin 1.6, bilateral lower extremity weakness, hypertension, hypokalemia, hypophosphatemia, CHF,  and failure to thrive.   Discharge Planning: Hospice facility  Thank you for allowing the Palliative Medicine Team to assist in the care of this patient.  Time Total: 65 min.   Visit consisted of counseling and education dealing with the complex and emotionally intense issues of symptom management and palliative care in the setting of serious and potentially life-threatening illness.Greater than 50%  of this time was spent counseling and coordinating care related to the above assessment and plan.  Alda Lea, AGPCNP-BC  Palliative Medicine Team [phone]

## 2021-11-23 NOTE — Progress Notes (Signed)
Occupational Therapy Treatment Patient Details Name: Marcus Fuller MRN: [id number] DOB: [birth year] Today's Date: 11/23/2021   History of present illness Marcus Fuller is a 80 y.o. 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   OT comments  Patient progressing slowly and showed improved ability to mobilize to EOB, stand and take side steps with assist of 1-2 people and use of RW, compared to previous session. Patient remains limited by confusion which can cause lack of cooperation and decreased safety subsequently, pain to LLE, generalized weakness and decreased activity tolerance along with deficits noted below. Pt continues to demonstrate fair rehab potential and would benefit from continued skilled OT to increase safety and independence with ADLs and functional transfers to allow pt to return home safely and reduce caregiver burden and fall risk.    Recommendations for follow up therapy are one component of a multi-disciplinary discharge planning process, led by the attending physician.  Recommendations may be updated based on patient status, additional functional criteria and insurance authorization.    Follow Up Recommendations  Skilled nursing-short term rehab (<3 hours/day)    Assistance Recommended at Discharge Frequent or [facility] bed    Recommendations for Other Services      Precautions / Restrictions Precautions Precautions: Fall Restrictions Weight Bearing Restrictions: No       Mobility Bed Mobility Overal bed mobility: Needs Assistance Bed Mobility: Supine to Sit;Sit to Supine     Supine to sit: Max assist;HOB elevated Sit to supine: Max assist   General bed mobility comments: Max x1 assist with step by step cues to initiate LE  movement off EOB. Assist to bring LE's fully off and reach for bed rail. Use of bed pad to pivot hips and assist to press trunk up to sitting. Patient Response: Anxious  Transfers Overall transfer level: Needs assistance Equipment used: Rolling walker (2 wheels) Transfers: Sit to/from Stand Sit to Stand: Mod assist           General transfer comment: Pt took ~3-4 unsteady lateral steps with RW and Min-Mod As of 2 people with step by step verbal cues and assist for negiatiating RW. Pt required MAx As to safely descend to EOB due impulsivity.     Balance Overall balance assessment: Needs assistance Sitting-balance support: Feet supported Sitting balance-Leahy Scale: Fair Sitting balance - Comments: Fair static, Poor dyanmic   Standing balance support: Reliant on assistive device for balance;Bilateral upper extremity supported;During functional activity Standing balance-Leahy Scale: Poor                             ADL either performed or assessed with clinical judgement   ADL                             Toilet Transfer Details (indicate cue type and reason): Unable to try for [facility] due to needs of imaging. See mobility for sit to stand transfers and side-stepping. Toileting- Clothing Manipulation and Hygiene: Total assistance;Bed level;+2 for physical assistance Toileting - Clothing Manipulation Details (indicate cue type and reason): incontinent of bowel once mobilizing back to bed after standing. Total Assist for bed level peri hygiene. Pt on Foley cath.            Extremity/Trunk Assessment  Upper Extremity Assessment Upper Extremity Assessment: Generalized weakness   Lower Extremity Assessment Lower Extremity Assessment: Defer to PT evaluation        Vision Baseline Vision/History: 1 Wears glasses     Perception     Praxis      Cognition Arousal/Alertness: Awake/alert Behavior During Therapy: Anxious;Restless Overall Cognitive Status:  Impaired/Different from baseline Area of Impairment: Orientation;Attention;Awareness;Following commands;Safety/judgement                 Orientation Level: Disoriented to;Place;Time;Situation Current Attention Level: Sustained Memory: Decreased short-term memory Following Commands: Follows one step commands with increased time;Follows one step commands consistently Safety/Judgement: Decreased awareness of deficits;Decreased awareness of safety Awareness: Emergent              Exercises     Shoulder Instructions       General Comments      Pertinent Vitals/ Pain       Breathing: normal Negative Vocalization: occasional moan/groan, low speech, negative/disapproving quality Facial Expression: sad, frightened, frown Body Language: tense, distressed pacing, fidgeting Consolability: distracted or reassured by voice/touch PAINAD Score: 4 Pain Location: LT LE Pain Descriptors / Indicators: Grimacing;Guarding Pain Intervention(s): Limited activity within patient's tolerance;Monitored during session;Repositioned  Home Living                                          Prior Functioning/Environment              Frequency  Min 2X/week        Progress Toward Goals  OT Goals(current goals can now be found in the care plan section)  Progress towards OT goals: Progressing toward goals  Acute Rehab OT Goals OT Goal Formulation: Patient unable to participate in goal setting Time For Goal Achievement: 12/04/21 Potential to Achieve Goals: [place] Discharge plan needs to be updated    Co-evaluation                 AM-PAC OT "6 Clicks" Daily Activity     Outcome Measure   Help from another person eating meals?: A Little Help from another person taking care of personal grooming?: A Little Help from another person toileting, which includes using toliet, bedpan, or urinal?: Total Help from another person bathing (including washing, rinsing,  drying)?: A Lot Help from another person to put on and taking off regular upper body clothing?: A Lot Help from another person to put on and taking off regular lower body clothing?: Total 6 Click Score: 12    End of Session Equipment Utilized During Treatment: Rolling walker (2 wheels);Gait belt  OT Visit Diagnosis: Other abnormalities of gait and mobility (R26.89);Unsteadiness on feet (R26.81);Pain;Muscle weakness (generalized) (M62.81)   Activity Tolerance Other (comment) (Limited due to toileting needs and confusion.)   Patient Left in bed;with call bell/phone within reach (With vascular service in room)   Nurse Communication Mobility status        Time: 1350-1409 OT Time Calculation (min): 19 min  Charges: OT General Charges $OT Visit: 1 Visit OT Treatments $Therapeutic Activity: 8-22 mins  Anderson Malta, [place] Office: [phone] 11/23/2021  Julien Girt 11/23/2021, 2:30 PM

## 2021-11-23 NOTE — Progress Notes (Signed)
Physical Therapy Treatment Patient Details Name: Marcus Fuller MRN: [id number] DOB: 1941-12-05 Today's Date: 11/23/2021   History of Present Illness Marcus Fuller is a 80 y.o. 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    PT Comments    Patient pleasant and requesting to mobilize to bathroom to void. Max assist required to sit up to EOB and Mod +2 for step to transfer with RW and step to [facility]. Min +2 assist to power up from elevated surface and steady in standing but Mod +2 assist to coordinate foot position with steps and prevent LOB as pt tends to have NBOS. Pt currently requires +2 assist for physical assist and safety with all functional transfers. If he were to return home he would require constant/24/7 assist/supervision with 2 persons available for safety. He would likely need additional equipment (see below). Pt will benefit from ST rehab at SNF to address deficits and progress mobility towards PLOF and to safe level for return home.    Recommendations for follow up therapy are one component of a multi-disciplinary discharge planning process, led by the attending physician.  Recommendations may be updated based on patient status, additional functional criteria and insurance authorization.  Follow Up Recommendations  ST rehab at SNF (Home health PT if 2+ assist available with constant assist/supervision)     Assistance Recommended at Discharge Frequent or constant Supervision/Assistance  Equipment Recommendations  BSC/3in1;Hospital bed (unclear of what patient has, but will need RW, WC, hosptial bed ( unless pt progresses) and 3n1)    Recommendations for Other Services       Precautions / Restrictions Precautions Precautions: Fall Restrictions Weight Bearing Restrictions: No     Mobility  Bed  Mobility Overal bed mobility: Needs Assistance Bed Mobility: Supine to Sit     Supine to sit: Max assist;HOB elevated     General bed mobility comments: Max x1 assist with cues to initiate LE movement off EOB. Assist to bring LE's fully off and reach for bed rail. Use of bed pad to pivot hips and assist to press trunk up to sitting.    Transfers Overall transfer level: Needs assistance Equipment used: Rolling walker (2 wheels) Transfers: Sit to/from Stand;Bed to chair/wheelchair/BSC Sit to Stand: Min assist;+2 safety/equipment     Step pivot transfers: Mod assist;+2 safety/equipment;From elevated surface     General transfer comment: Cues for hand placement and to initiate power up. Cues needed for upright posture once standing. Mod Assist +2 for safety to sequence small steps to move bed>BSC>recliner. Assist needed to turn walker.    Ambulation/Gait Ambulation/Gait assistance: +2 physical assistance;Mod assist Gait Distance (Feet): 5 Feet Assistive device: Rolling walker (2 wheels) Gait Pattern/deviations: Step-to pattern;Decreased step length - left;Decreased step length - right;Decreased stride length;Shuffle;Trunk flexed Gait velocity: decr     General Gait Details: Patient took small steps forward and assist to manage turning walker to move from [facility] to recliner. Manual assist required to improve BOS as pt has narrow stance cues for posture throughout.   Stairs             Wheelchair Mobility    Modified Rankin (Stroke Patients Only)       Balance Overall balance assessment: Needs assistance Sitting-balance support: Feet supported Sitting balance-Leahy Scale: Fair     Standing balance support: Reliant on assistive device for balance;Bilateral upper extremity supported;During functional activity  Standing balance-Leahy Scale: Poor                              Cognition Arousal/Alertness: Awake/alert Behavior During Therapy: WFL for tasks  assessed/performed Overall Cognitive Status: Impaired/Different from baseline Area of Impairment: Orientation;Attention;Awareness;Following commands                 Orientation Level: Disoriented to;Place;Time;Situation Current Attention Level: Sustained Memory: Decreased short-term memory Following Commands: Follows one step commands with increased time;Follows one step commands consistently Safety/Judgement: Decreased awareness of deficits              Exercises      General Comments        Pertinent Vitals/Pain Pain Assessment: PAINAD Breathing: normal Negative Vocalization: none Facial Expression: smiling or inexpressive Body Language: relaxed Consolability: no need to console PAINAD Score: 0 Pain Intervention(s): Limited activity within patient's tolerance;Monitored during session    Home Living                          Prior Function            PT Goals (current goals can now be found in the care plan section) Acute Rehab PT Goals Patient Stated Goal: I want to get better and go home PT Goal Formulation: With patient/family Time For Goal Achievement: 12/04/21 Potential to Achieve Goals: Good Progress towards PT goals: Progressing toward goals    Frequency    Min 3X/week      PT Plan Current plan remains appropriate    Co-evaluation              AM-PAC PT "6 Clicks" Mobility   Outcome Measure  Help needed turning from your back to your side while in a flat bed without using bedrails?: A Lot Help needed moving from lying on your back to sitting on the side of a flat bed without using bedrails?: A Lot Help needed moving to and from a bed to a chair (including a wheelchair)?: A Lot Help needed standing up from a chair using your arms (e.g., wheelchair or bedside chair)?: A Little Help needed to walk in hospital room?: A Lot Help needed climbing 3-5 steps with a railing? : Total 6 Click Score: 12    End of Session  Equipment Utilized During Treatment: Gait belt Activity Tolerance: Patient tolerated treatment well Patient left: in chair;with call bell/phone within reach;with chair alarm set Nurse Communication: Mobility status PT Visit Diagnosis: Unsteadiness on feet (R26.81);Muscle weakness (generalized) (M62.81);Repeated falls (R29.6)     Time: 0768-0881 PT Time Calculation (min) (ACUTE ONLY): 23 min  Charges:  $Therapeutic Activity: 23-37 mins                     Verner Mould, DPT Acute Rehabilitation Services Office [phone] Pager [phone]    Jacques Navy 11/23/2021, 10:44 AM

## 2021-11-23 NOTE — TOC Initial Note (Signed)
Transition of Care [facility]) - Initial/Assessment Note    Patient Details  Name: Marcus Fuller MRN: [id number] Date of Birth: December 02, 1941  Transition of Care [facility] CM/SW Contact:    Lynnell Catalan, RN Phone Number: 11/23/2021, 2:59 PM  Clinical Narrative:                  Spoke with daughter Anda Kraft via phone as she had requested to talk with me. We began conversation about dc planning. We discussed physical therapy recommendations and what going to SNF would look like. It was expressed to Anda Kraft that pt would have to agree to do therapy at [facility] for insurance to pay for it. She questions whether rehab is the best focus and is worried about him not eating. She asked several comfort focused questions. PMT saw pt on 12/3 and I will ask attending if PMT could come back to do a [place] discussion.         Admission diagnosis:  Brain metastases ([place]) [C79.31] AMS (altered mental status) [R41.82] Patient Active Problem List   Diagnosis Date Noted   Counseling regarding advance care planning and goals of care    AMS (altered mental status) 66/05/3015   Acute metabolic encephalopathy 12/27/3233   Metastatic adenocarcinoma ([place]) 11/19/2021   (HFpEF) heart failure with preserved ejection fraction ([place]) 11/19/2021   Lower extremity weakness 11/19/2021   Toe trauma 11/19/2021   Brain metastases ([place]) 11/09/2021   Focal seizure ([place]) 11/09/2021   Primary adenocarcinoma of upper lobe of right lung ([place]) 09/08/2021   Bone metastasis ([place]) 09/08/2021   Skin cancer 09/08/2021   Persistent atrial fibrillation ([place]) 04/06/2021   Secondary hypercoagulable state ([place]) 04/06/2021   AF (paroxysmal atrial fibrillation) ([place]) 03/23/2020   Chronic low back pain 07/03/2019   NSVT (nonsustained ventricular tachycardia) 05/15/2019   Increased thyroid stimulating hormone (TSH) level 06/22/2018   Chronic diastolic heart failure ([place]) 57/32/2025   Chronic systolic heart failure ([place]) 06/20/2018   Status post  laparoscopic cholecystectomy 05/21/2018   Atherosclerosis of aorta ([place]) 05/11/2018   Coronary artery disease 05/11/2018   Coronary artery disease involving native coronary artery of native heart without angina pectoris 02/11/2018   Pacemaker 02/11/2018   Mixed hyperlipidemia 02/11/2018   Tremor of right hand 02/11/2018   Parkinson disease ([place]) 01/29/2018   Cataract    PCP:  Cari Caraway, MD Pharmacy:   [id number] [place], [place] [address] [place] [address] Phone: [phone] Fax: [phone]     Social Determinants of Health (SDOH) Interventions    Readmission Risk Interventions No flowsheet data found.

## 2021-11-23 NOTE — Progress Notes (Signed)
HEMATOLOGY/ONCOLOGY INPATIENT PROGRESS NOTE  Date of Service: 11/22/2021  Inpatient Attending: .Kayleen Memos, DO   SUBJECTIVE  I followed up with Marcus Fuller from oncology perspective.  He just had an EEG was resting peacefully in the bed after having received Seroquel for sedation.  His wife and son were at bedside.  We continued our goals of care discussion. He was unable to have his MRI of the brain today due to significant agitation.  He has been having minimal p.o. intake. Wife notes that she does not see him being able to take the [place] and functioning with a reasonable quality of life at home.  I did acknowledged this understanding.  Wife notes that she and her son are quite clear that she would not want this to to be cared for in institutional hospice with a focus on supportive cares. She is not inclined for him to try to continue to work with physical therapy which has not been possible due to his mental status and limited nutritional intake.   OBJECTIVE:  NAD  PHYSICAL EXAMINATION: . Vitals:   11/23/21 0500 11/23/21 0516 11/23/21 0950 11/23/21 1334  BP:  133/77  119/76  Pulse:  81 68 81  Resp:  19  17  Temp:  (!) 97.5 F (36.4 C)  97.9 F (36.6 C)  TempSrc:  Oral  Oral  SpO2:  98%  95%  Weight: 168 lb 10.4 oz (76.5 kg)      Filed Weights   11/20/21 0500 11/23/21 0500  Weight: 168 lb 10.4 oz (76.5 kg) 168 lb 10.4 oz (76.5 kg)   .Body mass index is 23.52 kg/m. Patient not examined since he was resting comfortably after being gently and had not slept well for couple of days.  MEDICAL HISTORY:  Past Medical History:  Diagnosis Date   Anxiety    Cataract    beginning stages   Chronic low back pain 07/03/2019   Coronary artery disease    stent- 2007    History of kidney stones    Hypertension    Parkinson's disease ([place]) 01/29/2018   Presence of permanent cardiac pacemaker    2017    Tremor of right hand     SURGICAL HISTORY: Past Surgical  History:  Procedure Laterality Date   APPENDECTOMY     bbb     CARDIOVERSION N/A 04/19/2021   Procedure: CARDIOVERSION;  Surgeon: Lelon Perla, MD;  Location: [facility] ENDOSCOPY;  Service: Cardiovascular;  Laterality: N/A;   CHOLECYSTECTOMY N/A 05/21/2018   Procedure: LAPAROSCOPIC CHOLECYSTECTOMY;  Surgeon: Coralie Keens, MD;  Location: [facility] ORS;  Service: General;  Laterality: N/A;   left knee arthroscopy   1996   PACEMAKER INSERTION  11/2016   STENT PLACEMENT VASCULAR ([place] [facility]X)  2007   TONSILLECTOMY      SOCIAL HISTORY: Social History   Socioeconomic History   Marital status: Married    Spouse name: Marcus Fuller   Number of children: 2   Years of education: 14   Highest education level: Not on file  Occupational History   Not on file  Tobacco Use   Smoking status: Never   Smokeless tobacco: Never  Vaping Use   Vaping Use: Never used  Substance and Sexual Activity   Alcohol use: Not Currently    Comment: social    Drug use: No   Sexual activity: Not Currently  Other Topics Concern   Not on file  Social History Narrative   Lives w/ wife  Caffeine use: sometimes   Right handed    Social Determinants of Health   Financial Resource Strain: Not on file  Food Insecurity: Not on file  Transportation Needs: Not on file  Physical Activity: Not on file  Stress: Not on file  Social Connections: Not on file  Intimate Partner Violence: Not on file    FAMILY HISTORY: Family History  Problem Relation Age of Onset   Heart disease Mother    Diabetes Mother    Cancer Mother        breast   Heart disease Father    Heart attack Father    Atrial fibrillation Sister    Heart disease Brother    Pancreatic disease Brother    Tremor Brother    Heart attack Paternal Grandfather    Spina bifida Brother     ALLERGIES:  is allergic to atorvastatin and codeine.  MEDICATIONS:  Scheduled Meds:  apixaban  5 mg Oral BID   carbidopa-levodopa  2 tablet Oral QAC breakfast   And    carbidopa-levodopa  2 tablet Oral QPM   digoxin  125 mcg Oral Once per day on Sun Tue Thu Sat   feeding supplement  237 mL Oral BID BM   levETIRAcetam  500 mg Oral BID   multivitamin with minerals  1 tablet Oral Daily   QUEtiapine  12.5 mg Oral BID   senna-docusate  2 tablet Oral BID   thiamine injection  100 mg Intravenous Daily   traZODone  50 mg Oral QHS   Continuous Infusions:  dextrose 5% lactated ringers 50 mL/hr at 11/23/21 0554   PRN Meds:.acetaminophen **OR** acetaminophen, acetaminophen **OR** acetaminophen, hydrALAZINE, HYDROmorphone (DILAUDID) injection, LORazepam, ondansetron **OR** ondansetron (ZOFRAN) IV, oxyCODONE  REVIEW OF SYSTEMS:    Patient is sleeping could not not provide ROS  LABORATORY DATA:  I have reviewed the data as listed  . CBC Latest Ref Rng & Units 11/23/2021 11/22/2021 11/20/2021  WBC 4.0 - 10.5 K/uL 6.3 5.9 4.6  Hemoglobin 13.0 - 17.0 g/dL 12.1(L) 12.7(L) 12.3(L)  Hematocrit 39.0 - 52.0 % 37.2(L) 38.0(L) 37.6(L)  Platelets 150 - 400 K/uL 145(L) 152 142(L)    CMP Latest Ref Rng & Units 11/23/2021 11/22/2021 11/21/2021  Glucose 70 - 99 mg/dL 103(H) 104(H) 87  BUN 8 - 23 mg/dL '15 9 11  ' Creatinine 0.61 - 1.24 mg/dL 0.84 0.61 0.75  Sodium 135 - 145 mmol/L 137 135 139  Potassium 3.5 - 5.1 mmol/L 3.8 3.3(L) 3.4(L)  Chloride 98 - 111 mmol/L 107 108 106  CO2 22 - 32 mmol/L '24 22 26  ' Calcium 8.9 - 10.3 mg/dL 8.4(L) 8.2(L) 8.2(L)  Total Protein 6.5 - 8.1 g/dL 4.8(L) 5.1(L) 5.2(L)  Total Bilirubin 0.3 - 1.2 mg/dL 1.3(H) 1.6(H) 1.3(H)  Alkaline Phos 38 - 126 U/L 84 91 97  AST 15 - 41 U/L '19 20 24  ' ALT 0 - 44 U/L '6 6 9     ' RADIOGRAPHIC STUDIES: I have personally reviewed the radiological images as listed and agreed with the findings in the report. CT HEAD W & WO CONTRAST (5MM)  Result Date: 11/19/2021 CLINICAL DATA:  Metastatic disease evaluation EXAM: CT HEAD WITHOUT AND WITH CONTRAST TECHNIQUE: Contiguous axial images were obtained from the base of  the skull through the vertex without and with intravenous contrast CONTRAST:  162m OMNIPAQUE IOHEXOL 350 MG/ML SOLN COMPARISON:  Brain MRI 09/24/2021 FINDINGS: Brain: Unchanged 9 mm meningioma of the right middle cranial fossa. The enhancing metastases shown on the  brain MRI of 09/24/2021 are not visible on this study. There is generalized atrophy without lobar predilection. Hypodensity of the white matter is most commonly associated with chronic microvascular disease. Vascular: No abnormal hyperdensity of the major intracranial arteries or dural venous sinuses. No intracranial atherosclerosis. Skull: The visualized skull base, calvarium and extracranial soft tissues are normal. Sinuses/Orbits: No fluid levels or advanced mucosal thickening of the visualized paranasal sinuses. No mastoid or middle ear effusion. The orbits are normal. IMPRESSION: Unchanged 9 mm right middle cranial fossa meningioma. The enhancing metastases shown on the brain MRI of 09/24/2021 are not visible on this study. Electronically Signed   By: Ulyses Jarred M.D.   On: 11/19/2021 21:42   CT LUMBAR SPINE W CONTRAST  Result Date: 11/19/2021 CLINICAL DATA:  Metastatic disease to the right hemipelvis. Follow-up study. EXAM: CT LUMBAR SPINE WITH CONTRAST TECHNIQUE: Multidetector CT imaging of the lumbar spine was performed with intravenous contrast administration. CONTRAST:  140m OMNIPAQUE IOHEXOL 350 MG/ML SOLN COMPARISON:  CT chest, abdomen and pelvis 09/03/2021. FINDINGS: Segmentation: 5 lumbar type vertebrae. Alignment: There is mild lumbar dextroscoliosis apex at L2 and again noted minimal grade 1 retrolisthesis at L3-4. No new or worsening alignment abnormality is seen. Vertebrae: There is osteopenia. No acute spinal compression fracture or destructive lumbar bone lesion is seen. There is mild chronic wedging of the L1 vertebral body. There is moderate marginal osteophytosis at the upper 3 lumbar levels, less prominent marginal  osteophytes at the lower 2 levels. Paraspinal and other soft tissues: There is aortoiliac atherosclerosis and abdominal aortic tortuosity without AAA. No paraspinal mass is seen. A small left renal cyst is again noted. There is no left renal calculus or hydronephrosis with the right kidney not included in the imaging. Disc levels: T11-12 and T12-L1: Not well seen due to artifact from the patient's arms in the field but grossly unremarkable. L1-2: There is a normal disc height. No bulge, herniation or stenosis is seen. L2-3: No bulge, herniation or stenosis. L3-4: Stable grade 1 retrolisthesis. There is preservation of the normal disc height. Due to a posterior disc bulge and ligamentous thickening there is mild spinal canal and lateral recess stenosis. Due to facet spurring there is mild-to-moderate right foraminal stenosis. The left foramen is clear. L4-5: There is preservation of the normal disc height. Broad posterior disc bulge causes slight spinal canal AP stenosis and lateral recess stenosis. Right greater than left facet hypertrophy is present and there is moderate right, mild left foraminal stenosis. L5-S1: There is slight disc space loss with vacuum phenomenon. There is a nonstenosing posterior disc bulge without herniation. Right greater than left facet hypertrophy again noted with moderate to severe right, mild left foraminal stenosis. Other: Expansile destructive lesion of the posterior right iliac wing abutting the SI joint interface is again shown. It is not fully imaged but the visualized portion is larger than previously. A pathologic hairline fracture of the anterior aspect of the lesion is present on the lowest slices. Maximum measured AP and transverse axis on the current exam are 4.2 x 6.4 cm, on prior scan 3.6 x 6.1 cm. IMPRESSION: 1. 4.2 x 6.4 cm destructive expansile lesion of the posterior right iliac wing is slightly larger than on the 09/03/2021 CT. There is pathologic fracture through the  anterior cortex. 2. MRI of 08/03/2021 demonstrated 2 small foci of signal abnormality in the L2 and 3 vertebral bodies, but these are not visible on noncontrast CT. 3. Osteopenia, scoliosis and degenerative changes described  above, stable. 4. Aortic atherosclerosis. Electronically Signed   By: Telford Nab M.D.   On: 11/19/2021 22:08   DG CHEST PORT 1 VIEW  Result Date: 11/19/2021 CLINICAL DATA:  Encephalopathy EXAM: PORTABLE CHEST 1 VIEW COMPARISON:  None. FINDINGS: The heart size and mediastinal contours are within normal limits. Both lungs are clear. The visualized skeletal structures are unremarkable. IMPRESSION: No active disease. Electronically Signed   By: Ulyses Jarred M.D.   On: 11/19/2021 19:48   DG Foot Complete Right  Result Date: 11/19/2021 CLINICAL DATA:  Recent fall EXAM: RIGHT FOOT COMPLETE - 3+ VIEW COMPARISON:  None. FINDINGS: There is no evidence of fracture or dislocation. There is no evidence of arthropathy or other focal bone abnormality. Soft tissues are unremarkable. IMPRESSION: Negative. Electronically Signed   By: Ulyses Jarred M.D.   On: 11/19/2021 19:49   EEG adult  Result Date: 11/22/2021 Lora Havens, MD     11/22/2021  9:30 PM Patient Name: Marcus Fuller MRN: [id number] Epilepsy Attending: Lora Havens Referring Physician/Provider: Elodia Florence., MD Date: 11/22/2021 Duration: 22.30 mins Patient history: 80yo M with ams. EEG to evaluate for seizure Level of alertness: Awake AEDs during EEG study: LEV Technical aspects: This EEG study was done with scalp electrodes positioned according to the 10-20 International system of electrode placement. Electrical activity was acquired at a sampling rate of '500Hz'  and reviewed with a high frequency filter of '70Hz'  and a low frequency filter of '1Hz' . EEG data were recorded continuously and digitally stored. Description: EEG showed continuous generalized 3 to 6 Hz theta-delta slowing. Hyperventilation and photic  stimulation were not performed.   Patient was noted to have right upper extremity shaking on 11/22/2021 at 1608. Concomitant eeg before, during and after the event didnw show any eeg change to suggest seizure. ABNORMALITY - Continuous slow, generalized IMPRESSION: This study is suggestive of moderate diffuse encephalopathy, nonspecific etiology. No seizures or epileptiform discharges were seen throughout the recording. Patient was noted to have right upper extremity shaking on 11/22/2021 at 1608 without concomitant eeg change. This was most likely NOT and epileptic event. Priyanka O Yadav   VAS [name] ABI WITH/WO TBI  Result Date: 11/23/2021  LOWER EXTREMITY DOPPLER STUDY Patient Name:  Marcus Fuller  Date of Exam:   11/23/2021 Medical Rec #: [id number]        Accession #:    [id number] Date of Birth: [birth year]       Patient Gender: M Patient Age:   80 years Exam Location[facility]  [facility] Procedure:      VAS [name] ABI WITH/WO TBI Referring Phys: CAROLE HALL --------------------------------------------------------------------------------  Indications: Blue toes. High Risk Factors: Hyperlipidemia.  Comparison Study: No prior studies. Performing Technologist: Carlos Levering RVT  Examination Guidelines: A complete evaluation includes at minimum, Doppler waveform signals and systolic blood pressure reading at the level of bilateral brachial, anterior tibial, and posterior tibial arteries, when vessel segments are accessible. Bilateral testing is considered an integral part of a complete examination. Photoelectric Plethysmograph (PPG) waveforms and toe systolic pressure readings are included as required and additional duplex testing as needed. Limited examinations for reoccurring indications may be performed as noted.  ABI Findings: +---------+------------------+-----+----------+--------+ Right    Rt Pressure (mmHg)IndexWaveform  Comment  +---------+------------------+-----+----------+--------+ Brachial 96                      triphasic          +---------+------------------+-----+----------+--------+ PTA  97                1.01 biphasic           +---------+------------------+-----+----------+--------+ DP       68                0.71 monophasic         +---------+------------------+-----+----------+--------+ Great Toe38                0.40                    +---------+------------------+-----+----------+--------+ +---------+------------------+-----+---------+-------+ Left     Lt Pressure (mmHg)IndexWaveform Comment +---------+------------------+-----+---------+-------+ Brachial 82                     triphasic        +---------+------------------+-----+---------+-------+ PTA      120               1.25 triphasic        +---------+------------------+-----+---------+-------+ DP       103               1.07 triphasic        +---------+------------------+-----+---------+-------+ Great Toe72                0.75                  +---------+------------------+-----+---------+-------+ +-------+-----------+-----------+------------+------------+ ABI/TBIToday's ABIToday's TBIPrevious ABIPrevious TBI +-------+-----------+-----------+------------+------------+ Right  1.01       0.4                                 +-------+-----------+-----------+------------+------------+ Left   1.25       0.75                                +-------+-----------+-----------+------------+------------+  Summary: Right: Resting right ankle-brachial index is within normal range. No evidence of significant right lower extremity arterial disease. The right toe-brachial index is abnormal. Left: Resting left ankle-brachial index is within normal range. No evidence of significant left lower extremity arterial disease. The left toe-brachial index is normal.  *See table(s) above for measurements and observations.     Preliminary     ASSESSMENT & PLAN:   80 year old male with history of  Parkinson's disease, coronary artery disease, atrial fibrillation on anticoagulation with   1) Recently diagnosed metastatic lung adenocarcinoma with bone mets and brain metastases in the right frontal lobe EGFR mutation positive PD-L1 70% positive Neoplasm related pain is in his right posterior hip and upper thigh PET CT scan from 09/10/2021 showed Hypermetabolic solid nodule of the right lung apex measuring 12 mm compatible with primary lung malignancy.Hypermetabolic lytic lesions of the right ilium concerning for osseous metastatic disease.  Hypermetabolic focus which correlates with the right C2-C3 facet with no lytic lesion visualized, possibly due to degenerative disease.  MRI of the brain with and without contrast on 09/13/2021 which showed Three metastatic deposits in the brain in the right frontal lobe. Mild associated edema. 9 mm meningioma right middle cranial fossa. Atrophy and chronic microvascular ischemic change. S/p SRS to brain lesions in the right frontal lobe 10/22/2021  2) L2 and L3 lesions concerning for bone metastases. 3) history of Parkinson's disease on Sinemet follows with Dr. Margette Fast 4) coronary artery disease status post PCI 2007 atrial fibrillation status post pacemaker placement  on anticoagulation and digoxin follows with Dr. Sallyanne Kuster 5) hypertension 6) dyslipidemia  7) Altered Mental status- worsening Delirium PLAN -Patient was agitated today and was unable to pursue MRI of the brain. -EEG was done due to shaking head movement.  No obvious epileptiform discharge was noted.  Patient is on [place]. -The patient has been unable to sustain himself nutritionally or cooperate with self-cares or ambulation. -Given his significantly declined performance status, severe persistent delirium, worsening nutritional status and significant nursing needs it appears unlikely that he could be cared for at home. Also appears unlikely that he would benefit from physical therapy  and rehabilitation at this time. -After extensive discussion the patient and his wife and son would like to proceed with consideration of institution of hospice to pursue best supportive cares and hold off on continued use of Tagrisso at this time which I think would be the most appropriate way to keep Marcus Fuller comfortable. -Questions regarding prognosis from his metastatic lung cancer and approach of hospice discussed as well. -Appreciate palliative care input to help facilitate patient's placement with institutional hospice for best supportive cares. -Okay to forego use of Tagrisso as per wife and close family's wishes.    Sullivan Lone MD [place] [facility] Hematology/Oncology Physician [facility]

## 2021-11-23 NOTE — Progress Notes (Signed)
PROGRESS NOTE  Marcus Fuller WLN:[id number] DOB: 1941-09-25 DOA: 11/19/2021 PCP: Cari Caraway, MD  HPI/Recap of past 24 hours: Marcus Fuller is a 80 y.o. 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  Etiology of his confusion is unclear at this time.  CT head and chest x-ray were nonacute.  UA negative for pyuria.  EEG did not show any seizure activity or epileptiform.  MRI brain is pending.  MRI brain planned to be completed at [facility] was canceled on 11/22/21 due to delirium.  Hospital course complicated by delirium for which she was started on Seroquel 12.5 mg twice daily and trazodone nightly.  Delirium/aspiration/fall precautions are in place.  11/23/2021: Seen and examined at his bedside.  He is alert, and calm today but oriented x1.  He reports pain in his right Toes.  Bluish discoloration noted with difficulty palpating right dorsalis pedis.  ABI ordered to further assess.  Assessment/Plan: Principal Problem:   Acute metabolic encephalopathy Active Problems:   Parkinson disease (HCC)   Pacemaker   Coronary artery disease   AF (paroxysmal atrial fibrillation) (HCC)   Brain metastases (HCC)   Metastatic adenocarcinoma (HCC)   (HFpEF) heart failure with preserved ejection fraction (HCC)   Lower extremity weakness   Toe trauma   AMS (altered mental status)   Counseling regarding advance care planning and goals of care   *Delirium/acute metabolic encephalopathy secondary to acute illness Continue to treat underlying conditions. Thus far work-up has been negative Ammonia level -18 on 11/19/2021. CT head, chest x-ray, UA and urine culture negative. Chest x-ray nonacute. No seizure activity on EEG MRI brain is pending Continue non-pharmacological  management of delirium. Open blinds during the day, out of bed to chair during the day Continue to reorient frequently. Continue Seroquel 12.5 mg twice daily, trazodone 50 mg nightly. Continue fall precautions/aspiration precautions/delirium precautions.  Brain metastases ([place]) MRI 09/25/21 with 3 unchanged mets in right frontal lobe CT head on 11/19/2021 showed unchanged 9 mm right middle cranial fossa meningioma. Follows with Dr. Irene Limbo and Dr. Mickeal Skinner Concern for complex partial seizures in 11/09/21 note from neuro oncology Continue keppra 500 mg BID  MRI brain is pending. Continue seizure precautions   Metastatic adenocarcinoma ([place]) Metastatic lung adenocarcinoma with bone mets and brain metastases Was on tagrisso, but has been holding this at least past 5 days or so per discussion with family  S/p SRS with radiation oncology Management by medical oncology.   Bilateral lower extremity weakness Hasn't walked in well over a month MRI 08/04/21 with expansile lesion in R iliac bone concerning for malignancy, 2 small foci of signal abnormality in L2 and L3 vertebral bodies concerning for additional small mets Follow CT L spine with/without contrast 11/19/2021 showed 4.2 x 6.4 cm destructive expansile lesion of the posterior right iliac wing is slightly larger than on the 09/03/2021 CT. There is pathologic fracture through the anterior cortex. PT OT assessed and recommended home health PT OT Continue fall precautions.  Electrolyte disturbances: Hypokalemia, hypophosphatemia Serum potassium 3.3 Serum phosphorus 1.8 Repleted orally and intravenously.  Essential hypertension, BP is not at goal. BP is elevated Add IV antihypertensives as needed with parameters. Continue to monitor vital signs  Improving, isolated hyperbilirubinemia- T bilirubin 1.3 from 1.6 AST, ALT, alkaline phosphatase normal Albumin 2.6 Uptrending. Continue to monitor.  Thrombocytopenia  Platelet count is  downtrending 145 from 152 No overt bleeding Continue to monitor  Right toes discoloration No fracture on x-ray Difficult to palpate dorsalis pedis right foot Tender Follow ABI right foot   (HFpEF) heart failure with preserved ejection fraction ([place]) Euvolemic on exam Echo 2019 with EF 50-55% Strict I's and O's and daily weight   Coronary artery disease Denies having any anginal symptoms.   Pacemaker Noted, [facility]   Parkinson disease ([place]) Sinemet, dose increased per vaslow Continue dose prescribed for Dr. Jannifer [place] and follow   AF (paroxysmal atrial fibrillation) ([place]) Digoxin, eliquis  Physical debility/ambulatory dysfunction PT OT assessed and recommended SNF TOC assisting with SNF placement.      Code Status: Full code  Family Communication: Updated his daughter in person on 11/23/21.  Disposition Plan: Plan to discharge to SNF once medical oncology signs off.   Consultants: Medical oncology Neuro-oncology  Procedures: EEG ordered, pending  Antimicrobials: None  DVT prophylaxis: Eliquis  Status is: Inpatient  Inpatient status.  Patient will require at least 2 midnights for further evaluation and treatment of present condition.      Objective: Vitals:   11/23/21 0500 11/23/21 0516 11/23/21 0950 11/23/21 1334  BP:  133/77  119/76  Pulse:  81 68 81  Resp:  19  17  Temp:  (!) 97.5 F (36.4 C)  97.9 F (36.6 C)  TempSrc:  Oral  Oral  SpO2:  98%  95%  Weight: 76.5 kg       Intake/Output Summary (Last 24 hours) at 11/23/2021 1432 Last data filed at 11/23/2021 0554 Gross per 24 hour  Intake 560 ml  Output 250 ml  Net 310 ml   Filed Weights   11/20/21 0500 11/23/21 0500  Weight: 76.5 kg 76.5 kg    Exam:  General: 80 y.o. year-old male well-developed well-nourished in no acute distress.  He is alert and oriented x1 Cardiovascular: Regular rate and rhythm no rubs or gallops.  No JVD or thyromegaly.    Respiratory: Clear to auscultation with no wheezes or rales.  Poor inspiratory effort.   Abdomen: Soft nontender normal bowel sounds present.   Musculoskeletal: No lower extremity edema bilaterally. Skin: Right toes are bluish. Psychiatry: Mood is appropriate for condition and setting. Neuro: Moves all 4 extremities equally.   Data Reviewed: CBC: Recent Labs  Lab 11/19/21 1008 11/20/21 0642 11/22/21 0943 11/23/21 0549  WBC 5.0 4.6 5.9 6.3  NEUTROABS 3.6 3.1  --   --   HGB 13.0 12.3* 12.7* 12.1*  HCT 40.1 37.6* 38.0* 37.2*  MCV 89.3 90.0 89.2 91.2  PLT 163 142* 152 409*   Basic Metabolic Panel: Recent Labs  Lab 11/19/21 1008 11/20/21 0642 11/21/21 0608 11/22/21 0943 11/23/21 0549  NA 141 138 139 135 137  K 3.8 3.2* 3.4* 3.3* 3.8  CL 108 107 106 108 107  CO2 23 24 26 22 24   GLUCOSE 106* 88 87 104* 103*  BUN 18 16 11 9 15   CREATININE 0.83 0.79 0.75 0.61 0.84  CALCIUM 8.7* 8.3* 8.2* 8.2* 8.4*  MG  --  1.6* 1.5* 2.0  --   PHOS  --  2.0* 2.0* 1.8* 4.0   GFR: Estimated Creatinine Clearance: 75.9 mL/min (by C-G formula based on SCr of 0.84 mg/dL). Liver Function Tests: Recent Labs  Lab 11/19/21 1008 11/20/21 0642 11/21/21 0608 11/22/21 0943 11/23/21 0549  AST 16 21 24 20 19   ALT 6 10 9  6 6  ALKPHOS 118 84 97 91 84  BILITOT 0.9 1.4* 1.3* 1.6* 1.3*  PROT 5.6* 5.0* 5.2* 5.1* 4.8*  ALBUMIN 2.7* 2.6* 2.7* 2.6* 2.5*   No results for input(s): LIPASE, AMYLASE in the last 168 hours. Recent Labs  Lab 11/19/21 1852  AMMONIA 18   Coagulation Profile: No results for input(s): INR, PROTIME in the last 168 hours. Cardiac Enzymes: No results for input(s): CKTOTAL, CKMB, CKMBINDEX, TROPONINI in the last 168 hours. BNP (last 3 results) No results for input(s): PROBNP in the last 8760 hours. HbA1C: No results for input(s): HGBA1C in the last 72 hours. CBG: No results for input(s): GLUCAP in the last 168 hours. Lipid Profile: No results for input(s): CHOL, HDL,  LDLCALC, TRIG, CHOLHDL, LDLDIRECT in the last 72 hours. Thyroid Function Tests: No results for input(s): TSH, T4TOTAL, FREET4, T3FREE, THYROIDAB in the last 72 hours.  Anemia Panel: No results for input(s): VITAMINB12, FOLATE, FERRITIN, TIBC, IRON, RETICCTPCT in the last 72 hours.  Urine analysis:    Component Value Date/Time   COLORURINE YELLOW 11/21/2021 1233   APPEARANCEUR CLEAR 11/21/2021 1233   APPEARANCEUR Clear 06/20/2018 1122   LABSPEC 1.013 11/21/2021 1233   PHURINE 8.0 11/21/2021 1233   GLUCOSEU NEGATIVE 11/21/2021 1233   HGBUR SMALL (A) 11/21/2021 1233   BILIRUBINUR NEGATIVE 11/21/2021 1233   BILIRUBINUR Negative 06/20/2018 1122   KETONESUR NEGATIVE 11/21/2021 1233   PROTEINUR NEGATIVE 11/21/2021 1233   NITRITE NEGATIVE 11/21/2021 1233   LEUKOCYTESUR NEGATIVE 11/21/2021 1233   Sepsis Labs: @LABRCNTIP (procalcitonin:4,lacticidven:4)  ) Recent Results (from the past 240 hour(s))  Resp Panel by RT-PCR (Flu A&B, Covid) Nasopharyngeal Swab     Status: None   Collection Time: 11/19/21 11:12 PM   Specimen: Nasopharyngeal Swab; Nasopharyngeal(NP) swabs in vial transport medium  Result Value Ref Range Status   SARS Coronavirus 2 by RT PCR NEGATIVE NEGATIVE Final    Comment: (NOTE) SARS-CoV-2 target nucleic acids are NOT DETECTED.  The SARS-CoV-2 RNA is generally detectable in upper respiratory specimens during the acute phase of infection. The lowest concentration of SARS-CoV-2 viral copies this assay can detect is 138 copies/mL. A negative result does not preclude SARS-Cov-2 infection and should not be used as the sole basis for treatment or other patient management decisions. A negative result may occur with  improper specimen collection/handling, submission of specimen other than nasopharyngeal swab, presence of viral mutation(s) within the areas targeted by this assay, and inadequate number of viral copies(<138 copies/mL). A negative result must be combined  with clinical observations, patient history, and epidemiological information. The expected result is Negative.  Fact Sheet for Patients:  EntrepreneurPulse.com.au  Fact Sheet for Healthcare Providers:  IncredibleEmployment.be  This test is no t yet approved or cleared by the [place] FDA and  has been authorized for detection and/or diagnosis of SARS-CoV-2 by FDA under an Emergency Use Authorization (EUA). This EUA will remain  in effect (meaning this test can be used) for the duration of the COVID-19 declaration under Section 564(b)(1) of the Act, 21 U.S.C.section 360bbb-3(b)(1), unless the authorization is terminated  or revoked sooner.       Influenza A by PCR NEGATIVE NEGATIVE Final   Influenza B by PCR NEGATIVE NEGATIVE Final    Comment: (NOTE) The Xpert Xpress SARS-CoV-2/FLU/RSV plus assay is intended as an aid in the diagnosis of influenza from Nasopharyngeal swab specimens and should not be used as a sole basis for treatment. Nasal washings and aspirates are unacceptable for Xpert Xpress  SARS-CoV-2/FLU/RSV testing.  Fact Sheet for Patients: EntrepreneurPulse.com.au  Fact Sheet for Healthcare Providers: IncredibleEmployment.be  This test is not yet approved or cleared by the [place] FDA and has been authorized for detection and/or diagnosis of SARS-CoV-2 by FDA under an Emergency Use Authorization (EUA). This EUA will remain in effect (meaning this test can be used) for the duration of the COVID-19 declaration under Section 564(b)(1) of the Act, 21 U.S.C. section 360bbb-3(b)(1), unless the authorization is terminated or revoked.  Performed at [facility], [place] [address] [place], [place] [address]   Urine Culture     Status: Abnormal   Collection Time: 11/21/21  6:36 AM   Specimen: Urine, Clean Catch  Result Value Ref Range Status   Specimen Description   Final     URINE, CLEAN CATCH Performed at [facility], [place] [address], [place], [place] [address]    Special Requests   Final    NONE Performed at [facility], [place] [address], [place], [place] [address]    Culture (A)  Final    <10,000 COLONIES/mL INSIGNIFICANT GROWTH Performed at [facility] [address], [place], [place] [address]    Report Status 14-Dec-2021 FINAL  Final      Studies: EEG adult  Result Date: 2021/12/14 Lora Havens, MD     12/14/2021  9:30 PM Patient Name: URIAS SHEEK MRN: [id number] Epilepsy Attending: Lora Havens Referring Physician/Provider: Elodia Florence., MD Date: 12/14/21 Duration: 22.30 mins Patient history: 80yo M with ams. EEG to evaluate for seizure Level of alertness: Awake AEDs during EEG study: LEV Technical aspects: This EEG study was done with scalp electrodes positioned according to the 10-20 International system of electrode placement. Electrical activity was acquired at a sampling rate of 500Hz  and reviewed with a high frequency filter of 70Hz  and a low frequency filter of 1Hz . EEG data were recorded continuously and digitally stored. Description: EEG showed continuous generalized 3 to 6 Hz theta-delta slowing. Hyperventilation and photic stimulation were not performed.   Patient was noted to have right upper extremity shaking on 2021/12/14 at 1608. Concomitant eeg before, during and after the event didnw show any eeg change to suggest seizure. ABNORMALITY - Continuous slow, generalized IMPRESSION: This study is suggestive of moderate diffuse encephalopathy, nonspecific etiology. No seizures or epileptiform discharges were seen throughout the recording. Patient was noted to have right upper extremity shaking on December 14, 2021 at 1608 without concomitant eeg change. This was most likely NOT and epileptic event. Priyanka Barbra Sarks    Scheduled Meds:  apixaban  5 mg Oral BID   carbidopa-levodopa  2 tablet  Oral QAC breakfast   And   carbidopa-levodopa  2 tablet Oral QPM   digoxin  125 mcg Oral Once per day on Sun Tue Thu Sat   feeding supplement  237 mL Oral BID BM   levETIRAcetam  500 mg Oral BID   multivitamin with minerals  1 tablet Oral Daily   QUEtiapine  12.5 mg Oral BID   senna-docusate  2 tablet Oral BID   thiamine injection  100 mg Intravenous Daily   traZODone  50 mg Oral QHS    Continuous Infusions:  dextrose 5% lactated ringers 50 mL/hr at 11/23/21 0554     LOS: 3 days     Kayleen Memos, MD [facility] Pager [phone]  If 7PM-7AM, please contact night-coverage [email or web address] Password [email or web address] 11/23/2021, 2:32 PM

## 2021-11-23 NOTE — Progress Notes (Signed)
ABI's have been completed. Preliminary results can be found in CV Proc through chart review.  The patient was able to remain still during the entirety of testing.   11/23/21 2:40 PM Marcus Fuller RVT

## 2021-11-24 DIAGNOSIS — I5030 Unspecified diastolic (congestive) heart failure: Secondary | ICD-10-CM

## 2021-11-24 MED ORDER — SENNOSIDES-DOCUSATE SODIUM 8.6-50 MG PO TABS: 2.0000 | ORAL_TABLET | Freq: Two times a day (BID) | ORAL | Status: DC | PRN

## 2021-11-24 MED ORDER — HYDROMORPHONE HCL 1 MG/ML IJ SOLN
1.0000 mg | Freq: Three times a day (TID) | INTRAMUSCULAR | Status: DC
  Administered 2021-11-25 (×3): 1 mg via INTRAVENOUS
  Filled 2021-11-24 (×3): qty 1

## 2021-11-24 MED ORDER — HYDROMORPHONE HCL 1 MG/ML IJ SOLN
1.0000 mg | INTRAMUSCULAR | Status: DC | PRN
Start: 2021-11-24 — End: 2021-11-26

## 2021-11-24 NOTE — Progress Notes (Addendum)
   Palliative Medicine Inpatient Follow Up Note     Somnolent. Receiving scheduled ativan to assist in agitation. Per reports when awake complaining of some pain.   Recent ABI showed some ischemia which is most likely causing his pain and discomfort.   All care to continue focusing on his comfort. He is receiving dilaudid as needed for pain. Will consider scheduling for better control. Also requiring as needed Haldol in addition to ativan for agitation.   Family understands poor prognosis and goal of comfort care. They do not wish for him to suffer and is hopeful what time he has left is well managed.   Marcus Fuller is being evaluated by Marcus Fuller for approval at [facility]. Given patient's need for ongoing IV symptom management, prognosis of weeks-days, extensive disease progression (metastatic lung cancer with numerous brain mets, bone mets, spinal lesions) despite interventions and rapid decline, Parkinson's disease, foot ischemia, and lethargy consideration is appropriate versus continued hospitalization and hospital death. Family would not be able to manage him in the home due to extensive symptom burden, inability to take po medications consistently, and the need for ongoing scheduled and as needed IV dilaudid, haldol, and ativan.   We will continue to support patient and family to ensure his comfort focused needs are met.   Questions addressed and support provided.    Objective Assessment: Vital Signs Vitals:   11/23/21 1334 11/24/21 0551  BP: 119/76 132/75  Pulse: 81 82  Resp: 17 18  Temp: 97.9 F (36.6 C) 99.5 F (37.5 C)  SpO2: 95% 100%    Intake/Output Summary (Last 24 hours) at 11/24/2021 2306 Last data filed at 11/24/2021 3254 Gross per 24 hour  Intake --  Output 250 ml  Net -250 ml   Last Weight  Most recent update: 11/23/2021  6:19 AM    Weight  76.5 kg (168 lb 10.4 oz)            Gen:  somnolent, ill appearing  CV: Irregular  PULM: diminished   DIY:MEBRA foot discoloration Neuro: somnolent, agitation when awake, confusion  SUMMARY OF RECOMMENDATIONS   Continue with comfort focused care Aggressive symptom management Continue with scheduled ativan and as needed dosing Scheduled dilaudid with as needed availability Haldol as needed  Pending [facility] review and approval.  PMT will continue to support and follow on as needed basis. Please secure chat for urgent needs.   Time Total: 40 min.   Visit consisted of counseling and education dealing with the complex and emotionally intense issues of symptom management and palliative care in the setting of serious and potentially life-threatening illness.Greater than 50%  of this time was spent counseling and coordinating care related to the above assessment and plan.  Alda Lea, AGPCNP-BC  Palliative Medicine Team [phone]  Palliative Medicine Team providers are available by phone from 7am to 7pm daily and can be reached through the team cell phone. Should this patient require assistance outside of these hours, please call the patient's attending physician.

## 2021-11-24 NOTE — Progress Notes (Signed)
Manufacturing engineer [facility]) Hospital Liaison Note  Received request from [employer] [place] for family interest in [facility]. Visited patient at bedside and spoke with daughter/Kate to confirm interest and explain services.  Approval for [employer] is determined by [facility] MD. Once [facility] MD has determined [facility] eligibility, [place] will update hospital staff and family.  Please do not hesitate to call with any hospice related questions.    Thank you for the opportunity to participate in this patient's care.  Daphene Calamity, MSW [facility] Liaison  [phone]

## 2021-11-24 NOTE — TOC Progression Note (Signed)
Transition of Care [facility]) - Progression Note    Patient Details  Name: Marcus Fuller MRN: [id number] Date of Birth: 02/01/1941  [facility]) CM/SW Contact  Kirsten Mckone, Marjie Skiff, RN Phone Number: 11/24/2021, 11:33 AM  Clinical Narrative:     [facility] consult for [place] referral. [facility] liaison contacted for referral. TOC will continue to follow and assist with dc.  Expected Discharge Plan: [place] Barriers to Discharge: Continued Medical Work up  Expected Discharge Plan and Services Expected Discharge Plan: [place]   Discharge Planning Services: CM Consult   Living arrangements for the past 2 months: Single Family Home                       Social Determinants of Health (SDOH) Interventions    Readmission Risk Interventions No flowsheet data found.

## 2021-11-24 NOTE — Progress Notes (Signed)
PROGRESS NOTE  Marcus Fuller:[id number] DOB: 01/13/1941 DOA: 11/19/2021 PCP: Cari Caraway, MD  HPI/Recap of past 24 hours: Marcus Fuller is a 80 y.o. 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  Etiology of his confusion is unclear at this time.  CT head and chest x-ray were nonacute.  UA negative for pyuria.  EEG did not show any seizure activity or epileptiform.  MRI brain is pending.  MRI brain planned to be completed at [facility] was canceled on 11/22/21 due to delirium.  Hospital course complicated by delirium for which she was started on Seroquel 12.5 mg twice daily and trazodone nightly.  Delirium/aspiration/fall precautions are in place.  11/24/2021: Seen and examined at his bedside.  Patient is currently sleeping, recently received sedatives.  Family reports that he was complaining of pain in his feet.  Assessment/Plan: Principal Problem:   Acute metabolic encephalopathy Active Problems:   Parkinson disease (HCC)   Pacemaker   Coronary artery disease   AF (paroxysmal atrial fibrillation) (HCC)   Brain metastases (HCC)   Metastatic adenocarcinoma (HCC)   (HFpEF) heart failure with preserved ejection fraction (HCC)   Lower extremity weakness   Toe trauma   AMS (altered mental status)   Counseling regarding advance care planning and goals of care   *Delirium/acute metabolic encephalopathy secondary to acute illness Continue to treat underlying conditions. Thus far work-up has been negative Ammonia level -18 on 11/19/2021. CT head, chest x-ray, UA and urine culture negative. Chest x-ray nonacute. No seizure activity on EEG Continue non-pharmacological management of delirium. Open blinds during the day, out of bed to chair during the  day Continue to reorient frequently. Continue Seroquel 12.5 mg twice daily, trazodone 50 mg nightly. Continue Ativan as needed for agitation Continue fall precautions/aspiration precautions/delirium precautions.  Brain metastases ([place]) MRI 09/25/21 with 3 unchanged mets in right frontal lobe CT head on 11/19/2021 showed unchanged 9 mm right middle cranial fossa meningioma. Follows with Dr. Irene Limbo and Dr. Mickeal Skinner Concern for complex partial seizures in 11/09/21 note from neuro oncology Continue keppra 500 mg BID  Continue seizure precautions   Metastatic adenocarcinoma ([place]) Metastatic lung adenocarcinoma with bone mets and brain metastases Was on tagrisso, but this was discontinued at family's request S/p SRS with radiation oncology After discussion with oncology, family has elected to pursue hospice care   Bilateral lower extremity weakness Hasn't walked in well over a month MRI 08/04/21 with expansile lesion in R iliac bone concerning for malignancy, 2 small foci of signal abnormality in L2 and L3 vertebral bodies concerning for additional small mets Follow CT L spine with/without contrast 11/19/2021 showed 4.2 x 6.4 cm destructive expansile lesion of the posterior right iliac wing is slightly larger than on the 09/03/2021 CT. There is pathologic fracture through the anterior cortex. Continue fall precautions.  Electrolyte disturbances: Hypokalemia, hypophosphatemia Serum potassium 3.3 Serum phosphorus 1.8 Repleted orally and intravenously.  Essential hypertension, BP is not at goal. BP is elevated Add IV antihypertensives as needed with parameters. Continue to monitor vital signs  Improving, isolated hyperbilirubinemia- T bilirubin 1.3 from 1.6 AST, ALT, alkaline phosphatase normal Albumin 2.6 Uptrending. Continue to monitor.  Thrombocytopenia Platelet count is downtrending 145 from 152 No overt bleeding Continue to monitor  Right toes discoloration No fracture on  x-ray Difficult to palpate dorsalis pedis right  foot Tender ABI shows monophasic waveform right toe.  Likely implies some degree of ischemia.  Clinically, he does have discoloration of his right great toe as well as other digits on his right foot.  Suspect some degree of ischemia and likely associated pain.   (HFpEF) heart failure with preserved ejection fraction ([place]) Euvolemic on exam Echo 2019 with EF 50-55% Strict I's and O's and daily weight   Coronary artery disease Denies having any anginal symptoms.   Parkinson disease (HCC) Sinemet, dose increased per vaslow Continue dose prescribed for Dr. Jannifer Franklin and follow   AF (paroxysmal atrial fibrillation) (HCC) Digoxin, eliquis   Goals of care -Patient's family Long discussion with palliative care as well as oncology -They have elected to pursue quality of life/comfort care and action and placement at residential hospice at [facility] -I suspect that his prognosis would fall into criteria for [facility] -Would focus on pain management agitation, encourage p.o. intake as able   Code Status: DNR, comfort measures  Family Communication: Updated wife, son and daughter at the bedside 12/7  Disposition Plan: Awaiting placement at residential hospice once bed is available   Consultants: Medical oncology Neuro-oncology Palliative care  Procedures: EEG did not show any epileptiform discharges  Antimicrobials: None  DVT prophylaxis: Eliquis  Status is: Inpatient  Inpatient status.  Patient will require at least 2 midnights for further evaluation and treatment of present condition.      Objective: Vitals:   11/23/21 0516 11/23/21 0950 11/23/21 1334 11/24/21 0551  BP: 133/77  119/76 132/75  Pulse: 81 68 81 82  Resp: 19  17 18   Temp: (!) 97.5 F (36.4 C)  97.9 F (36.6 C) 99.5 F (37.5 C)  TempSrc: Oral  Oral Oral  SpO2: 98%  95% 100%  Weight:        Intake/Output Summary (Last 24 hours) at 11/24/2021  2035 Last data filed at 11/24/2021 4193 Gross per 24 hour  Intake --  Output 250 ml  Net -250 ml   Filed Weights   11/20/21 0500 11/23/21 0500  Weight: 76.5 kg 76.5 kg    Exam:  General: 80 y.o. year-old male who is sleeping on my arrival, appears comfortable Cardiovascular: Regular rate and rhythm no rubs or gallops.  No JVD or thyromegaly.   Respiratory: Clear to auscultation with no wheezes or rales.  Poor inspiratory effort.   Abdomen: Soft nontender normal bowel sounds present.   Musculoskeletal: No lower extremity edema bilaterally. Skin: Right toes are bluish. Psychiatry: Somnolent Neuro: Moves all 4 extremities equally.   Data Reviewed: CBC: Recent Labs  Lab 11/19/21 1008 11/20/21 0642 11/22/21 0943 11/23/21 0549  WBC 5.0 4.6 5.9 6.3  NEUTROABS 3.6 3.1  --   --   HGB 13.0 12.3* 12.7* 12.1*  HCT 40.1 37.6* 38.0* 37.2*  MCV 89.3 90.0 89.2 91.2  PLT 163 142* 152 790*   Basic Metabolic Panel: Recent Labs  Lab 11/19/21 1008 11/20/21 0642 11/21/21 0608 11/22/21 0943 11/23/21 0549  NA 141 138 139 135 137  K 3.8 3.2* 3.4* 3.3* 3.8  CL 108 107 106 108 107  CO2 23 24 26 22 24   GLUCOSE 106* 88 87 104* 103*  BUN 18 16 11 9 15   CREATININE 0.83 0.79 0.75 0.61 0.84  CALCIUM 8.7* 8.3* 8.2* 8.2* 8.4*  MG  --  1.6* 1.5* 2.0  --   PHOS  --  2.0* 2.0* 1.8* 4.0   GFR: Estimated Creatinine Clearance: 75.9 mL/min (by C-G  formula based on SCr of 0.84 mg/dL). Liver Function Tests: Recent Labs  Lab 11/19/21 1008 11/20/21 0642 11/21/21 0608 11/22/21 0943 11/23/21 0549  AST 16 21 24 20 19   ALT 6 10 9 6 6   ALKPHOS 118 84 97 91 84  BILITOT 0.9 1.4* 1.3* 1.6* 1.3*  PROT 5.6* 5.0* 5.2* 5.1* 4.8*  ALBUMIN 2.7* 2.6* 2.7* 2.6* 2.5*   No results for input(s): LIPASE, AMYLASE in the last 168 hours. Recent Labs  Lab 11/19/21 1852  AMMONIA 18   Coagulation Profile: No results for input(s): INR, PROTIME in the last 168 hours. Cardiac Enzymes: No results for  input(s): CKTOTAL, CKMB, CKMBINDEX, TROPONINI in the last 168 hours. BNP (last 3 results) No results for input(s): PROBNP in the last 8760 hours. HbA1C: No results for input(s): HGBA1C in the last 72 hours. CBG: No results for input(s): GLUCAP in the last 168 hours. Lipid Profile: No results for input(s): CHOL, HDL, LDLCALC, TRIG, CHOLHDL, LDLDIRECT in the last 72 hours. Thyroid Function Tests: No results for input(s): TSH, T4TOTAL, FREET4, T3FREE, THYROIDAB in the last 72 hours.  Anemia Panel: No results for input(s): VITAMINB12, FOLATE, FERRITIN, TIBC, IRON, RETICCTPCT in the last 72 hours.  Urine analysis:    Component Value Date/Time   COLORURINE YELLOW 11/21/2021 1233   APPEARANCEUR CLEAR 11/21/2021 1233   APPEARANCEUR Clear 06/20/2018 1122   LABSPEC 1.013 11/21/2021 1233   PHURINE 8.0 11/21/2021 1233   GLUCOSEU NEGATIVE 11/21/2021 1233   HGBUR SMALL (A) 11/21/2021 1233   BILIRUBINUR NEGATIVE 11/21/2021 1233   BILIRUBINUR Negative 06/20/2018 1122   KETONESUR NEGATIVE 11/21/2021 1233   PROTEINUR NEGATIVE 11/21/2021 1233   NITRITE NEGATIVE 11/21/2021 1233   LEUKOCYTESUR NEGATIVE 11/21/2021 1233   Sepsis Labs: @LABRCNTIP (procalcitonin:4,lacticidven:4)  ) Recent Results (from the past 240 hour(s))  Resp Panel by RT-PCR (Flu A&B, Covid) Nasopharyngeal Swab     Status: None   Collection Time: 11/19/21 11:12 PM   Specimen: Nasopharyngeal Swab; Nasopharyngeal(NP) swabs in vial transport medium  Result Value Ref Range Status   SARS Coronavirus 2 by RT PCR NEGATIVE NEGATIVE Final    Comment: (NOTE) SARS-CoV-2 target nucleic acids are NOT DETECTED.  The SARS-CoV-2 RNA is generally detectable in upper respiratory specimens during the acute phase of infection. The lowest concentration of SARS-CoV-2 viral copies this assay can detect is 138 copies/mL. A negative result does not preclude SARS-Cov-2 infection and should not be used as the sole basis for treatment or other  patient management decisions. A negative result may occur with  improper specimen collection/handling, submission of specimen other than nasopharyngeal swab, presence of viral mutation(s) within the areas targeted by this assay, and inadequate number of viral copies(<138 copies/mL). A negative result must be combined with clinical observations, patient history, and epidemiological information. The expected result is Negative.  Fact Sheet for Patients:  EntrepreneurPulse.com.au  Fact Sheet for Healthcare Providers:  IncredibleEmployment.be  This test is no t yet approved or cleared by the [place] FDA and  has been authorized for detection and/or diagnosis of SARS-CoV-2 by FDA under an Emergency Use Authorization (EUA). This EUA will remain  in effect (meaning this test can be used) for the duration of the COVID-19 declaration under Section 564(b)(1) of the Act, 21 U.S.C.section 360bbb-3(b)(1), unless the authorization is terminated  or revoked sooner.       Influenza A by PCR NEGATIVE NEGATIVE Final   Influenza B by PCR NEGATIVE NEGATIVE Final    Comment: (NOTE) The Xpert Xpress SARS-CoV-2/FLU/RSV  plus assay is intended as an aid in the diagnosis of influenza from Nasopharyngeal swab specimens and should not be used as a sole basis for treatment. Nasal washings and aspirates are unacceptable for Xpert Xpress SARS-CoV-2/FLU/RSV testing.  Fact Sheet for Patients: EntrepreneurPulse.com.au  Fact Sheet for Healthcare Providers: IncredibleEmployment.be  This test is not yet approved or cleared by the [place] FDA and has been authorized for detection and/or diagnosis of SARS-CoV-2 by FDA under an Emergency Use Authorization (EUA). This EUA will remain in effect (meaning this test can be used) for the duration of the COVID-19 declaration under Section 564(b)(1) of the Act, 21 U.S.C. section  360bbb-3(b)(1), unless the authorization is terminated or revoked.  Performed at [facility], [place] [address] [place], [place] [address]   Urine Culture     Status: Abnormal   Collection Time: 11/21/21  6:36 AM   Specimen: Urine, Clean Catch  Result Value Ref Range Status   Specimen Description   Final    URINE, CLEAN CATCH Performed at [facility], [place] [address], [place], [place] [address]    Special Requests   Final    NONE Performed at [facility], [place] [address], [place], [place] [address]    Culture (A)  Final    <10,000 COLONIES/mL INSIGNIFICANT GROWTH Performed at [place] [address] [place], [place] [address]    Report Status 11/22/2021 FINAL  Final      Studies: No results found.  Scheduled Meds:  apixaban  5 mg Oral BID   carbidopa-levodopa  2 tablet Oral QAC breakfast   And   carbidopa-levodopa  2 tablet Oral QPM   digoxin  125 mcg Oral Once per day on Sun Tue Thu Sat   levETIRAcetam  500 mg Oral BID   LORazepam  1 mg Intravenous BID   QUEtiapine  12.5 mg Oral BID   senna-docusate  2 tablet Oral BID   thiamine injection  100 mg Intravenous Daily   traZODone  50 mg Oral QHS    Continuous Infusions:  dextrose 5% lactated ringers 10 mL/hr at 11/23/21 1740     LOS: 4 days     Kathie Dike, MD [facility]   If 7PM-7AM, please contact night-coverage [email or web address]  11/24/2021, 8:35 PM

## 2021-11-25 DIAGNOSIS — Z515 Encounter for palliative care: Secondary | ICD-10-CM

## 2021-11-25 MED ORDER — GLYCOPYRROLATE 0.2 MG/ML IJ SOLN: 0.2000 mg | INTRAMUSCULAR | Status: DC | PRN

## 2021-11-25 MED ORDER — POLYVINYL ALCOHOL 1.4 % OP SOLN: 1.0000 [drp] | Freq: Four times a day (QID) | OPHTHALMIC | Status: DC | PRN

## 2021-11-25 MED ORDER — ONDANSETRON HCL 4 MG PO TABS: 4.0000 mg | ORAL_TABLET | Freq: Four times a day (QID) | ORAL | 0 refills | Status: AC | PRN

## 2021-11-25 MED ORDER — HYDROMORPHONE HCL 1 MG/ML IJ SOLN: 1.0000 mg | Freq: Three times a day (TID) | INTRAMUSCULAR | 0 refills | Status: AC

## 2021-11-25 MED ORDER — BIOTENE DRY MOUTH MT LIQD: 15.0000 mL | OROMUCOSAL | Status: DC | PRN

## 2021-11-25 MED ORDER — ONDANSETRON HCL 4 MG/2ML IJ SOLN: 4.0000 mg | Freq: Four times a day (QID) | INTRAMUSCULAR | Status: DC | PRN

## 2021-11-25 MED ORDER — HYDROMORPHONE HCL 1 MG/ML IJ SOLN: 1.0000 mg | INTRAMUSCULAR | 0 refills | Status: AC | PRN

## 2021-11-25 MED ORDER — ONDANSETRON 4 MG PO TBDP: 4.0000 mg | ORAL_TABLET | Freq: Four times a day (QID) | ORAL | Status: DC | PRN

## 2021-11-25 MED ORDER — LORAZEPAM 2 MG/ML IJ SOLN: 1.0000 mg | Freq: Two times a day (BID) | INTRAMUSCULAR | 0 refills | Status: AC

## 2021-11-25 MED ORDER — GLYCOPYRROLATE 1 MG PO TABS: 1.0000 mg | ORAL_TABLET | ORAL | Status: DC | PRN

## 2021-11-25 MED ORDER — SENNOSIDES-DOCUSATE SODIUM 8.6-50 MG PO TABS: 2.0000 | ORAL_TABLET | Freq: Two times a day (BID) | ORAL | Status: AC | PRN

## 2021-11-25 MED ORDER — LORAZEPAM 2 MG/ML IJ SOLN: 1.0000 mg | Freq: Four times a day (QID) | INTRAMUSCULAR | 0 refills | Status: AC | PRN

## 2021-11-25 MED ORDER — ACETAMINOPHEN 650 MG RE SUPP: 650.0000 mg | Freq: Four times a day (QID) | RECTAL | Status: DC | PRN

## 2021-11-25 MED ORDER — ACETAMINOPHEN 325 MG PO TABS: 650.0000 mg | ORAL_TABLET | Freq: Four times a day (QID) | ORAL | Status: DC | PRN

## 2021-11-25 NOTE — Progress Notes (Signed)
Manufacturing engineer [facility]) Hospital Liaison Note   Patient is set to transport to [facility] this evening at 9 PM via [facility] (arranged by TOC/Nora). Family notified and consents have been completed earlier this afternoon.   Please send signed DNR form with patient and RN call report to [phone].    Please do not hesitate to call with any hospice related questions.    Thank you for the opportunity to participate in this patient's care.   Daphene Calamity, MSW [facility] Liaison  [phone]

## 2021-11-25 NOTE — TOC Transition Note (Signed)
Transition of Care [facility]) - CM/SW Discharge Note   Patient Details  Name: Marcus Fuller MRN: [id number] Date of Birth: 1941-04-14  Transition of Care [facility] CM/SW Contact:  Lynnell Catalan, RN Phone Number: 11/25/2021, 2:45 PM   Clinical Narrative:     Pt to dc to [facility] today via [place]. Request made for pt to be scheduled for transport for 9pm. Yellow DNR on the shadow chart. RN to call report to [phone].  Final next level of care: [place] Barriers to Discharge: Continued Medical Work up     Discharge Plan and Services   Discharge Planning Services: CM Consult                Readmission Risk Interventions No flowsheet data found.

## 2021-11-25 NOTE — Care Management Important Message (Signed)
Important Message  Patient Details Patient under Palliative Care Name: Marcus Fuller MRN: [id number] Date of Birth: 1941-07-18   Medicare Important Message Given:  No     Kerin Salen 11/25/2021, 10:17 AM

## 2021-11-25 NOTE — Progress Notes (Signed)
    Patient examined. Wife and son are at the bedside.   Marcus Fuller is somnolent. No acute distress noted. Will mumble and briefly open eyes as to talk with his wife and falls back to sleep.   Unable to take oral medications today due to somnolence. Scheduled dilaudid and ativan for agitation and pain. Family was able to go home last night and get some rest given patient's comfortable state.   Discussions with family at the bedside regarding plans for transfer to [facility] later tonight. Wife verbalizes she is in agreement for patient to be transported at whatever time this occurs to allow family to be at the bedside with him on tomorrow. Discussed continued symptom management and support at [facility]. Family express appreciation of all care received during patient's course of treatment at the [facility] and during his hospitalization.   Family shares their comfort in their decisions and appreciation of patient's calming and restful state.   All questions answered and support provided.   Plan -Continue comfort focused care -Completed DNR on chart -unable to take any oral medications due to lethargy -Scheduled dilaudid and ativan for pain and agitation -Patient to transfer to [facility] after 9pm with a goal for continued comfort and symptom management for what time he has left.    Time Total: 20 min.   Visit consisted of counseling and education dealing with the complex and emotionally intense issues of symptom management and palliative care in the setting of serious and potentially life-threatening illness.Greater than 50%  of this time was spent counseling and coordinating care related to the above assessment and plan.  Alda Lea, AGPCNP-BC  Palliative Medicine Team [phone]

## 2021-11-25 NOTE — Discharge Summary (Signed)
Physician Discharge Summary  SHAHMEER BUNN LGX:[id number] DOB: 09/10/1941 DOA: 11/19/2021  PCP: Cari Caraway, MD  Admit date: 11/19/2021 Discharge date: 11/25/2021  Admitted From: home Disposition:  residential hospice  Recommendations for Outpatient Follow-up:  Patient to be discharged to [facility] place for end of life care  Discharge Condition:terminal CODE STATUS:DNR Diet recommendation: regular diet for comfort  Brief/Interim Summary: NTHONY Fuller is a 80 y.o. 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  Etiology of his confusion is unclear at this time.  CT head and chest x-ray were nonacute.  UA negative for pyuria.  EEG did not show any seizure activity or epileptiform.  MRI brain is pending.  MRI brain planned to be completed at [facility] was canceled on 11/22/21 due to delirium.   Hospital course complicated by delirium for which she was started on Seroquel 12.5 mg twice daily and trazodone nightly.  Unfortunately, it does not appear that his mental status make meaningful improvements.  He continues to be delirious.  After discussion with oncology and palliative care, family has elected to pursue comfort care/hospice.  Patient will be discharged to residential hospice.  Discharge Diagnoses:  Principal Problem:   Acute metabolic encephalopathy Active Problems:   Parkinson disease ([place])   Pacemaker   Coronary artery disease   AF (paroxysmal atrial fibrillation) (HCC)   Brain metastases (HCC)   Metastatic adenocarcinoma (HCC)   (HFpEF) heart failure with preserved ejection fraction (HCC)   Lower extremity weakness   Toe trauma   AMS (altered mental status)   Counseling regarding advance care planning and goals of care   Hospice care patient  Acute  metabolic encephalopathy -Not appear to be infectious related.  Possible contribution from metastatic cancer, seizures, medications -CT head noted to be nonacute -EEG did not show any epileptiform discharges -He was continued on his home dose of Keppra -He was treated with Ativan for persistent delirium  Metastatic adenocarcinoma of the lung with mets to bone and brain mets -Was previously taking Tagrisso, this was discontinued-his request -After discussing with oncology, family has elected to pursue hospice care -He was receiving Keppra 500 mg p.o. twice daily for complex seizures, but this was discontinued when he stopped taking p.o. medications -We will continue on scheduled Ativan  Bilateral lower extremity weakness -Nonambulatory for over a month -Previous MRI showed expansile lesion right iliac bone concerning for malignancy -CT of the lumbar spine on 12/02 showed enlarging mass -Also commented on pathologic fracture through the anterior cortex  Right toe discoloration -X-rays negative for fracture -ABIs showed monophasic waveforms in the right toe.  Likely implying some degree of ischemia. -Continue pain management  Goals of care -Seen by palliative care and patient has been transitioned to comfort measures -We will discontinue all oral medications since he has been refusing them anyways and unlikely to change long-term prognosis -Continue pain management with Dilaudid, as needed agitation with lorazepam -Patient will be discharged to residential hospice for end-of-life care  Discharge Instructions  Discharge Instructions     Diet - low sodium heart healthy   Complete by: As directed    Increase activity slowly   Complete by: As directed       Allergies as of 11/25/2021       Reactions   Atorvastatin Other (See Comments)   Joint pain  Codeine Itching        Medication List     STOP taking these medications    carbidopa-levodopa 25-250 MG tablet Commonly  known as: Sinemet   digoxin 0.125 MG tablet Commonly known as: LANOXIN   Eliquis 5 MG Tabs tablet Generic drug: apixaban   gabapentin 600 MG tablet Commonly known as: Neurontin   HYDROmorphone 2 MG tablet Commonly known as: Dilaudid Replaced by: HYDROmorphone 1 MG/ML injection   levETIRAcetam 250 MG tablet Commonly known as: KEPPRA   Melatonin 10 MG Tabs   osimertinib mesylate 80 MG tablet Commonly known as: Tagrisso   triamcinolone 0.1% oint-Eucerin equivalent cream 1:1 mixture       TAKE these medications    acetaminophen 500 MG tablet Commonly known as: TYLENOL Take 500-1,000 mg by mouth every 6 (six) hours as needed (for pain).   HYDROmorphone 1 MG/ML injection Commonly known as: DILAUDID Inject 1 mL (1 mg total) into the vein 3 (three) times daily. Replaces: HYDROmorphone 2 MG tablet   HYDROmorphone 1 MG/ML injection Commonly known as: DILAUDID Inject 1-2 mLs (1-2 mg total) into the vein every 2 (two) hours as needed for severe pain or moderate pain.   LORazepam 2 MG/ML injection Commonly known as: ATIVAN Inject 0.5 mLs (1 mg total) into the vein 2 (two) times daily.   LORazepam 2 MG/ML injection Commonly known as: ATIVAN Inject 0.5 mLs (1 mg total) into the vein every 6 (six) hours as needed for anxiety, sedation or seizure.   ondansetron 4 MG tablet Commonly known as: ZOFRAN Take 1 tablet (4 mg total) by mouth every 6 (six) hours as needed for nausea.   senna-docusate 8.6-50 MG tablet Commonly known as: Senokot-S Take 2 tablets by mouth 2 (two) times daily as needed for mild constipation.        Allergies  Allergen Reactions   Atorvastatin Other (See Comments)    Joint pain   Codeine Itching    Consultations: Oncology Palliative care   Procedures/Studies: CT HEAD W & WO CONTRAST (5MM)  Result Date: 11/19/2021 CLINICAL DATA:  Metastatic disease evaluation EXAM: CT HEAD WITHOUT AND WITH CONTRAST TECHNIQUE: Contiguous axial images were  obtained from the base of the skull through the vertex without and with intravenous contrast CONTRAST:  15mL OMNIPAQUE IOHEXOL 350 MG/ML SOLN COMPARISON:  Brain MRI 09/24/2021 FINDINGS: Brain: Unchanged 9 mm meningioma of the right middle cranial fossa. The enhancing metastases shown on the brain MRI of 09/24/2021 are not visible on this study. There is generalized atrophy without lobar predilection. Hypodensity of the white matter is most commonly associated with chronic microvascular disease. Vascular: No abnormal hyperdensity of the major intracranial arteries or dural venous sinuses. No intracranial atherosclerosis. Skull: The visualized skull base, calvarium and extracranial soft tissues are normal. Sinuses/Orbits: No fluid levels or advanced mucosal thickening of the visualized paranasal sinuses. No mastoid or middle ear effusion. The orbits are normal. IMPRESSION: Unchanged 9 mm right middle cranial fossa meningioma. The enhancing metastases shown on the brain MRI of 09/24/2021 are not visible on this study. Electronically Signed   By: Ulyses Jarred M.D.   On: 11/19/2021 21:42   CT LUMBAR SPINE W CONTRAST  Result Date: 11/19/2021 CLINICAL DATA:  Metastatic disease to the right hemipelvis. Follow-up study. EXAM: CT LUMBAR SPINE WITH CONTRAST TECHNIQUE: Multidetector CT imaging of the lumbar spine was performed with intravenous contrast administration. CONTRAST:  153mL OMNIPAQUE IOHEXOL 350 MG/ML SOLN COMPARISON:  CT chest, abdomen and pelvis 09/03/2021. FINDINGS: Segmentation:  5 lumbar type vertebrae. Alignment: There is mild lumbar dextroscoliosis apex at L2 and again noted minimal grade 1 retrolisthesis at L3-4. No new or worsening alignment abnormality is seen. Vertebrae: There is osteopenia. No acute spinal compression fracture or destructive lumbar bone lesion is seen. There is mild chronic wedging of the L1 vertebral body. There is moderate marginal osteophytosis at the upper 3 lumbar levels, less  prominent marginal osteophytes at the lower 2 levels. Paraspinal and other soft tissues: There is aortoiliac atherosclerosis and abdominal aortic tortuosity without AAA. No paraspinal mass is seen. A small left renal cyst is again noted. There is no left renal calculus or hydronephrosis with the right kidney not included in the imaging. Disc levels: T11-12 and T12-L1: Not well seen due to artifact from the patient's arms in the field but grossly unremarkable. L1-2: There is a normal disc height. No bulge, herniation or stenosis is seen. L2-3: No bulge, herniation or stenosis. L3-4: Stable grade 1 retrolisthesis. There is preservation of the normal disc height. Due to a posterior disc bulge and ligamentous thickening there is mild spinal canal and lateral recess stenosis. Due to facet spurring there is mild-to-moderate right foraminal stenosis. The left foramen is clear. L4-5: There is preservation of the normal disc height. Broad posterior disc bulge causes slight spinal canal AP stenosis and lateral recess stenosis. Right greater than left facet hypertrophy is present and there is moderate right, mild left foraminal stenosis. L5-S1: There is slight disc space loss with vacuum phenomenon. There is a nonstenosing posterior disc bulge without herniation. Right greater than left facet hypertrophy again noted with moderate to severe right, mild left foraminal stenosis. Other: Expansile destructive lesion of the posterior right iliac wing abutting the SI joint interface is again shown. It is not fully imaged but the visualized portion is larger than previously. A pathologic hairline fracture of the anterior aspect of the lesion is present on the lowest slices. Maximum measured AP and transverse axis on the current exam are 4.2 x 6.4 cm, on prior scan 3.6 x 6.1 cm. IMPRESSION: 1. 4.2 x 6.4 cm destructive expansile lesion of the posterior right iliac wing is slightly larger than on the 09/03/2021 CT. There is pathologic  fracture through the anterior cortex. 2. MRI of 08/03/2021 demonstrated 2 small foci of signal abnormality in the L2 and 3 vertebral bodies, but these are not visible on noncontrast CT. 3. Osteopenia, scoliosis and degenerative changes described above, stable. 4. Aortic atherosclerosis. Electronically Signed   By: Telford Nab M.D.   On: 11/19/2021 22:08   DG CHEST PORT 1 VIEW  Result Date: 11/19/2021 CLINICAL DATA:  Encephalopathy EXAM: PORTABLE CHEST 1 VIEW COMPARISON:  None. FINDINGS: The heart size and mediastinal contours are within normal limits. Both lungs are clear. The visualized skeletal structures are unremarkable. IMPRESSION: No active disease. Electronically Signed   By: Ulyses Jarred M.D.   On: 11/19/2021 19:48   DG Foot Complete Right  Result Date: 11/19/2021 CLINICAL DATA:  Recent fall EXAM: RIGHT FOOT COMPLETE - 3+ VIEW COMPARISON:  None. FINDINGS: There is no evidence of fracture or dislocation. There is no evidence of arthropathy or other focal bone abnormality. Soft tissues are unremarkable. IMPRESSION: Negative. Electronically Signed   By: Ulyses Jarred M.D.   On: 11/19/2021 19:49   EEG adult  Result Date: 11/22/2021 Lora Havens, MD     11/22/2021  9:30 PM Patient Name: Marcus Fuller MRN: [id number] Epilepsy Attending: Lora Havens Referring Physician/Provider:  Elodia Florence., MD Date: 11/22/2021 Duration: 22.30 mins Patient history: 79yo M with ams. EEG to evaluate for seizure Level of alertness: Awake AEDs during EEG study: LEV Technical aspects: This EEG study was done with scalp electrodes positioned according to the 10-20 International system of electrode placement. Electrical activity was acquired at a sampling rate of 500Hz  and reviewed with a high frequency filter of 70Hz  and a low frequency filter of 1Hz . EEG data were recorded continuously and digitally stored. Description: EEG showed continuous generalized 3 to 6 Hz theta-delta slowing. Hyperventilation  and photic stimulation were not performed.   Patient was noted to have right upper extremity shaking on 11/22/2021 at 1608. Concomitant eeg before, during and after the event didnw show any eeg change to suggest seizure. ABNORMALITY - Continuous slow, generalized IMPRESSION: This study is suggestive of moderate diffuse encephalopathy, nonspecific etiology. No seizures or epileptiform discharges were seen throughout the recording. Patient was noted to have right upper extremity shaking on 11/22/2021 at 1608 without concomitant eeg change. This was most likely NOT and epileptic event. Priyanka O Yadav   VAS [name] ABI WITH/WO TBI  Result Date: 11/23/2021  LOWER EXTREMITY DOPPLER STUDY Patient Name:  HAO DION  Date of Exam:   11/23/2021 Medical Rec #: [id number]        Accession #:    [id number] Date of Birth: 11/05/1941       Patient Gender: M Patient Age:   75 years Exam Location:  [facility] Procedure:      VAS [name] ABI WITH/WO TBI Referring Phys: CAROLE HALL --------------------------------------------------------------------------------  Indications: Blue toes. High Risk Factors: Hyperlipidemia.  Comparison Study: No prior studies. Performing Technologist: Carlos Levering RVT  Examination Guidelines: A complete evaluation includes at minimum, Doppler waveform signals and systolic blood pressure reading at the level of bilateral brachial, anterior tibial, and posterior tibial arteries, when vessel segments are accessible. Bilateral testing is considered an integral part of a complete examination. Photoelectric Plethysmograph (PPG) waveforms and toe systolic pressure readings are included as required and additional duplex testing as needed. Limited examinations for reoccurring indications may be performed as noted.  ABI Findings: +---------+------------------+-----+----------+--------+ Right    Rt Pressure (mmHg)IndexWaveform  Comment  +---------+------------------+-----+----------+--------+ Brachial  96                     triphasic          +---------+------------------+-----+----------+--------+ PTA      97                1.01 biphasic           +---------+------------------+-----+----------+--------+ DP       68                0.71 monophasic         +---------+------------------+-----+----------+--------+ Great Toe38                0.40                    +---------+------------------+-----+----------+--------+ +---------+------------------+-----+---------+-------+ Left     Lt Pressure (mmHg)IndexWaveform Comment +---------+------------------+-----+---------+-------+ Brachial 82                     triphasic        +---------+------------------+-----+---------+-------+ PTA      120               1.25 triphasic        +---------+------------------+-----+---------+-------+ DP  103               1.07 triphasic        +---------+------------------+-----+---------+-------+ Great Toe72                0.75                  +---------+------------------+-----+---------+-------+ +-------+-----------+-----------+------------+------------+ ABI/TBIToday's ABIToday's TBIPrevious ABIPrevious TBI +-------+-----------+-----------+------------+------------+ Right  1.01       0.4                                 +-------+-----------+-----------+------------+------------+ Left   1.25       0.75                                +-------+-----------+-----------+------------+------------+  Summary: Right: Resting right ankle-brachial index is within normal range. No evidence of significant right lower extremity arterial disease. The right toe-brachial index is abnormal. Left: Resting left ankle-brachial index is within normal range. No evidence of significant left lower extremity arterial disease. The left toe-brachial index is normal.  *See table(s) above for measurements and observations.  Electronically signed by Deitra Mayo MD on 11/23/2021 at  6:32:22 PM.    Final       Subjective: Patient remains lethargic. Family reports that he has been refusing to take meds and po intake  Discharge Exam: Vitals:   11/23/21 0950 11/23/21 1334 11/24/21 0551 11/25/21 0539  BP:  119/76 132/75 (!) 146/103  Pulse: 68 81 82 84  Resp:  17 18 16   Temp:  97.9 F (36.6 C) 99.5 F (37.5 C) 97.6 F (36.4 C)  TempSrc:  Oral Oral Oral  SpO2:  95% 100% 96%  Weight:        General: Pt is sleeping, does not appear to be in distress Cardiovascular: RRR, S1/S2 +, no rubs, no gallops Respiratory: CTA bilaterally, no wheezing, no rhonchi     The results of significant diagnostics from this hospitalization (including imaging, microbiology, ancillary and laboratory) are listed below for reference.     Microbiology: Recent Results (from the past 240 hour(s))  Resp Panel by RT-PCR (Flu A&B, Covid) Nasopharyngeal Swab     Status: None   Collection Time: 11/19/21 11:12 PM   Specimen: Nasopharyngeal Swab; Nasopharyngeal(NP) swabs in vial transport medium  Result Value Ref Range Status   SARS Coronavirus 2 by RT PCR NEGATIVE NEGATIVE Final    Comment: (NOTE) SARS-CoV-2 target nucleic acids are NOT DETECTED.  The SARS-CoV-2 RNA is generally detectable in upper respiratory specimens during the acute phase of infection. The lowest concentration of SARS-CoV-2 viral copies this assay can detect is 138 copies/mL. A negative result does not preclude SARS-Cov-2 infection and should not be used as the sole basis for treatment or other patient management decisions. A negative result may occur with  improper specimen collection/handling, submission of specimen other than nasopharyngeal swab, presence of viral mutation(s) within the areas targeted by this assay, and inadequate number of viral copies(<138 copies/mL). A negative result must be combined with clinical observations, patient history, and epidemiological information. The expected result is  Negative.  Fact Sheet for Patients:  EntrepreneurPulse.com.au  Fact Sheet for Healthcare Providers:  IncredibleEmployment.be  This test is no t yet approved or cleared by the [place] FDA and  has been authorized for detection and/or diagnosis of SARS-CoV-2 by FDA under an  Emergency Use Authorization (EUA). This EUA will remain  in effect (meaning this test can be used) for the duration of the COVID-19 declaration under Section 564(b)(1) of the Act, 21 U.S.C.section 360bbb-3(b)(1), unless the authorization is terminated  or revoked sooner.       Influenza A by PCR NEGATIVE NEGATIVE Final   Influenza B by PCR NEGATIVE NEGATIVE Final    Comment: (NOTE) The Xpert Xpress SARS-CoV-2/FLU/RSV plus assay is intended as an aid in the diagnosis of influenza from Nasopharyngeal swab specimens and should not be used as a sole basis for treatment. Nasal washings and aspirates are unacceptable for Xpert Xpress SARS-CoV-2/FLU/RSV testing.  Fact Sheet for Patients: EntrepreneurPulse.com.au  Fact Sheet for Healthcare Providers: IncredibleEmployment.be  This test is not yet approved or cleared by the [place] FDA and has been authorized for detection and/or diagnosis of SARS-CoV-2 by FDA under an Emergency Use Authorization (EUA). This EUA will remain in effect (meaning this test can be used) for the duration of the COVID-19 declaration under Section 564(b)(1) of the Act, 21 U.S.C. section 360bbb-3(b)(1), unless the authorization is terminated or revoked.  Performed at [facility], [place] [address], [place], [place] [address]   Urine Culture     Status: Abnormal   Collection Time: 11/21/21  6:36 AM   Specimen: Urine, Clean Catch  Result Value Ref Range Status   Specimen Description   Final    URINE, CLEAN CATCH Performed at [facility], [place] [address]  [place], [place] [address]    Special Requests   Final    NONE Performed at [facility], [place] [address], [place],  [address]    Culture (A)  Final    <10,000 COLONIES/mL INSIGNIFICANT GROWTH Performed at [place] [address] [place],  [address]    Report Status 11/22/2021 FINAL  Final     Labs: BNP (last 3 results) No results for input(s): BNP in the last 8760 hours. Basic Metabolic Panel: Recent Labs  Lab 11/19/21 1008 11/20/21 0642 11/21/21 0608 11/22/21 0943 11/23/21 0549  NA 141 138 139 135 137  K 3.8 3.2* 3.4* 3.3* 3.8  CL 108 107 106 108 107  CO2 23 24 26 22 24   GLUCOSE 106* 88 87 104* 103*  BUN 18 16 11 9 15   CREATININE 0.83 0.79 0.75 0.61 0.84  CALCIUM 8.7* 8.3* 8.2* 8.2* 8.4*  MG  --  1.6* 1.5* 2.0  --   PHOS  --  2.0* 2.0* 1.8* 4.0   Liver Function Tests: Recent Labs  Lab 11/19/21 1008 11/20/21 0642 11/21/21 0608 11/22/21 0943 11/23/21 0549  AST 16 21 24 20 19   ALT 6 10 9 6 6   ALKPHOS 118 84 97 91 84  BILITOT 0.9 1.4* 1.3* 1.6* 1.3*  PROT 5.6* 5.0* 5.2* 5.1* 4.8*  ALBUMIN 2.7* 2.6* 2.7* 2.6* 2.5*   No results for input(s): LIPASE, AMYLASE in the last 168 hours. Recent Labs  Lab 11/19/21 1852  AMMONIA 18   CBC: Recent Labs  Lab 11/19/21 1008 11/20/21 0642 11/22/21 0943 11/23/21 0549  WBC 5.0 4.6 5.9 6.3  NEUTROABS 3.6 3.1  --   --   HGB 13.0 12.3* 12.7* 12.1*  HCT 40.1 37.6* 38.0* 37.2*  MCV 89.3 90.0 89.2 91.2  PLT 163 142* 152 145*   Cardiac Enzymes: No results for input(s): CKTOTAL, CKMB, CKMBINDEX, TROPONINI in the last 168 hours. BNP: Invalid input(s): POCBNP CBG: No results for input(s):  GLUCAP in the last 168 hours. D-Dimer No results for input(s): DDIMER in the last 72 hours. Hgb A1c No results for input(s): HGBA1C in the last 72 hours. Lipid Profile No results for input(s): CHOL, HDL, LDLCALC, TRIG, CHOLHDL, LDLDIRECT in the last 72 hours. Thyroid function studies No results  for input(s): TSH, T4TOTAL, T3FREE, THYROIDAB in the last 72 hours.  Invalid input(s): FREET3 Anemia work up No results for input(s): VITAMINB12, FOLATE, FERRITIN, TIBC, IRON, RETICCTPCT in the last 72 hours. Urinalysis    Component Value Date/Time   COLORURINE YELLOW 11/21/2021 1233   APPEARANCEUR CLEAR 11/21/2021 1233   APPEARANCEUR Clear 06/20/2018 1122   LABSPEC 1.013 11/21/2021 1233   PHURINE 8.0 11/21/2021 1233   GLUCOSEU NEGATIVE 11/21/2021 1233   HGBUR SMALL (A) 11/21/2021 1233   BILIRUBINUR NEGATIVE 11/21/2021 1233   BILIRUBINUR Negative 06/20/2018 1122   KETONESUR NEGATIVE 11/21/2021 1233   PROTEINUR NEGATIVE 11/21/2021 1233   NITRITE NEGATIVE 11/21/2021 1233   LEUKOCYTESUR NEGATIVE 11/21/2021 1233   Sepsis Labs Invalid input(s): PROCALCITONIN,  WBC,  LACTICIDVEN Microbiology Recent Results (from the past 240 hour(s))  Resp Panel by RT-PCR (Flu A&B, Covid) Nasopharyngeal Swab     Status: None   Collection Time: 11/19/21 11:12 PM   Specimen: Nasopharyngeal Swab; Nasopharyngeal(NP) swabs in vial transport medium  Result Value Ref Range Status   SARS Coronavirus 2 by RT PCR NEGATIVE NEGATIVE Final    Comment: (NOTE) SARS-CoV-2 target nucleic acids are NOT DETECTED.  The SARS-CoV-2 RNA is generally detectable in upper respiratory specimens during the acute phase of infection. The lowest concentration of SARS-CoV-2 viral copies this assay can detect is 138 copies/mL. A negative result does not preclude SARS-Cov-2 infection and should not be used as the sole basis for treatment or other patient management decisions. A negative result may occur with  improper specimen collection/handling, submission of specimen other than nasopharyngeal swab, presence of viral mutation(s) within the areas targeted by this assay, and inadequate number of viral copies(<138 copies/mL). A negative result must be combined with clinical observations, patient history, and  epidemiological information. The expected result is Negative.  Fact Sheet for Patients:  EntrepreneurPulse.com.au  Fact Sheet for Healthcare Providers:  IncredibleEmployment.be  This test is no t yet approved or cleared by the [place] FDA and  has been authorized for detection and/or diagnosis of SARS-CoV-2 by FDA under an Emergency Use Authorization (EUA). This EUA will remain  in effect (meaning this test can be used) for the duration of the COVID-19 declaration under Section 564(b)(1) of the Act, 21 U.S.C.section 360bbb-3(b)(1), unless the authorization is terminated  or revoked sooner.       Influenza A by PCR NEGATIVE NEGATIVE Final   Influenza B by PCR NEGATIVE NEGATIVE Final    Comment: (NOTE) The Xpert Xpress SARS-CoV-2/FLU/RSV plus assay is intended as an aid in the diagnosis of influenza from Nasopharyngeal swab specimens and should not be used as a sole basis for treatment. Nasal washings and aspirates are unacceptable for Xpert Xpress SARS-CoV-2/FLU/RSV testing.  Fact Sheet for Patients: EntrepreneurPulse.com.au  Fact Sheet for Healthcare Providers: IncredibleEmployment.be  This test is not yet approved or cleared by the [place] FDA and has been authorized for detection and/or diagnosis of SARS-CoV-2 by FDA under an Emergency Use Authorization (EUA). This EUA will remain in effect (meaning this test can be used) for the duration of the COVID-19 declaration under Section 564(b)(1) of the Act, 21 U.S.C. section 360bbb-3(b)(1), unless the authorization is terminated or revoked.  Performed at [facility], [place] [address] [place], [place] [address]   Urine Culture     Status: Abnormal   Collection Time: 11/21/21  6:36 AM   Specimen: Urine, Clean Catch  Result Value Ref Range Status   Specimen Description   Final    URINE, CLEAN CATCH Performed at [facility], [place] [address] [place], [place] [address]    Special Requests   Final    NONE Performed at [facility], [place] [address], [place], [place] [address]    Culture (A)  Final    <10,000 COLONIES/mL INSIGNIFICANT GROWTH Performed at [place] [address] [place], [place] [address]    Report Status 11/22/2021 FINAL  Final     Time coordinating discharge: 76mins  SIGNED:   Kathie Dike, MD  [facility] 11/25/2021, 5:09 PM   If 7PM-7AM, please contact night-coverage [email or web address]

## 2021-11-25 NOTE — Progress Notes (Signed)
SLP Cancellation Note  Patient Details Name: Marcus Fuller MRN: [id number] DOB: [birth year]   Cancelled treatment:       Reason Eval/Treat Not Completed: Other (comment) (pt now comfort care with potential plans to dc to [facility]) Kathleen Lime, MS [facility] SLP [place] [phone] Pager [phone]   Macario Golds 11/25/2021, 8:51 AM

## 2021-11-25 NOTE — Progress Notes (Signed)
Report called to [facility].

## 2021-11-27 ENCOUNTER — Encounter: Payer: Self-pay | Admitting: Hematology

## 2021-11-27 NOTE — Progress Notes (Signed)
Marland Kitchen   HEMATOLOGY/ONCOLOGY CLINIC NOTE  Date of Service: .11/19/2021   Patient Care Team: Cari Caraway, MD as PCP - General (Family Medicine) Sanda Klein, MD as PCP - Cardiology (Cardiology) Jola Baptist, [place] as Referring Physician (Chiropractic Medicine) Pickenpack-Cousar, Carlena Sax, NP as Nurse Practitioner (Nurse Practitioner)  CHIEF COMPLAINTS/PURPOSE OF CONSULTATION:  Follow-up for metastatic lung cancer.  HISTORY OF PRESENTING ILLNESS:  Please see previous HPI for details on initial presentation.  INTERVAL HISTORY  Marcus Fuller is here for follow-up of his metastatic lung adenocarcinoma.  He is accompanied by his daughter. He has had increasing confusion and hyperactive delirium and has not been sleeping much for the last several days despite taking melatonin. He has been seen by Dr. Mickeal Skinner from neuro-oncology for his altered mental status and there was concerns that he might be having partial complex seizures and he was placed on Keppra. No reported fevers or chills. He has not been able to function at home or maintain adequate p.o. intake or work with home therapies. Daughter notes that the family has been unable to take care of him at home and he has had a few falls. He has stopped taking his Tagrisso in the context of his worsening mental status and functional status as well as delirium.  This has not led to any improvement.  After extensive goals of care discussion by me as well as the palliative care team there was a decision to admit him to the hospital to rule out reversible causes of delirium and evaluate for consideration to transition to best supportive cares through institutional hospice.   MEDICAL HISTORY:  Past Medical History:  Diagnosis Date   Anxiety    Cataract    beginning stages   Chronic low back pain 07/03/2019   Coronary artery disease    stent- 2007    History of kidney stones    Hypertension    Parkinson's disease ([place]) 01/29/2018    Presence of permanent cardiac pacemaker    2017    Tremor of right hand     SURGICAL HISTORY: Past Surgical History:  Procedure Laterality Date   APPENDECTOMY     bbb     CARDIOVERSION N/A 04/19/2021   Procedure: CARDIOVERSION;  Surgeon: Lelon Perla, MD;  Location: [facility] ENDOSCOPY;  Service: Cardiovascular;  Laterality: N/A;   CHOLECYSTECTOMY N/A 05/21/2018   Procedure: LAPAROSCOPIC CHOLECYSTECTOMY;  Surgeon: Coralie Keens, MD;  Location: [facility] ORS;  Service: General;  Laterality: N/A;   left knee arthroscopy   1996   PACEMAKER INSERTION  11/2016   STENT PLACEMENT VASCULAR ([place] [facility])  2007   TONSILLECTOMY      SOCIAL HISTORY: Social History   Socioeconomic History   Marital status: Married    Spouse name: Cecelia   Number of children: 2   Years of education: 14   Highest education level: Not on file  Occupational History   Not on file  Tobacco Use   Smoking status: Never   Smokeless tobacco: Never  Vaping Use   Vaping Use: Never used  Substance and Sexual Activity   Alcohol use: Not Currently    Comment: social    Drug use: No   Sexual activity: Not Currently  Other Topics Concern   Not on file  Social History Narrative   Lives w/ wife   Caffeine use: sometimes   Right handed    Social Determinants of Health   Financial Resource Strain: Not on file  Food Insecurity: Not on file  Transportation Needs: Not on file  Physical Activity: Not on file  Stress: Not on file  Social Connections: Not on file  Intimate Partner Violence: Not on file    FAMILY HISTORY: Family History  Problem Relation Age of Onset   Heart disease Mother    Diabetes Mother    Cancer Mother        breast   Heart disease Father    Heart attack Father    Atrial fibrillation Sister    Heart disease Brother    Pancreatic disease Brother    Tremor Brother    Heart attack Paternal Grandfather    Spina bifida Brother     ALLERGIES:  is allergic to atorvastatin and  codeine.  MEDICATIONS:  Current Outpatient Medications  Medication Sig Dispense Refill   acetaminophen (TYLENOL) 500 MG tablet Take 500-1,000 mg by mouth every 6 (six) hours as needed (for pain).     HYDROmorphone (DILAUDID) 1 MG/ML injection Inject 1 mL (1 mg total) into the vein 3 (three) times daily. 1 mL 0   HYDROmorphone (DILAUDID) 1 MG/ML injection Inject 1-2 mLs (1-2 mg total) into the vein every 2 (two) hours as needed for severe pain or moderate pain. 1 mL 0   LORazepam (ATIVAN) 2 MG/ML injection Inject 0.5 mLs (1 mg total) into the vein 2 (two) times daily. 1 mL 0   LORazepam (ATIVAN) 2 MG/ML injection Inject 0.5 mLs (1 mg total) into the vein every 6 (six) hours as needed for anxiety, sedation or seizure. 1 mL 0   ondansetron (ZOFRAN) 4 MG tablet Take 1 tablet (4 mg total) by mouth every 6 (six) hours as needed for nausea. 20 tablet 0   senna-docusate (SENOKOT-S) 8.6-50 MG tablet Take 2 tablets by mouth 2 (two) times daily as needed for mild constipation.     No current facility-administered medications for this visit.    REVIEW OF SYSTEMS:   .10 Point review of Systems was done is negative except as noted above. PHYSICAL EXAMINATION: ECOG PERFORMANCE STATUS: 3 - Symptomatic, >50% confined to bed  . Vitals:   11/19/21 1033  BP: (!) 83/64  Pulse: 78  Resp: 17  Temp: 97.9 F (36.6 C)  SpO2: 97%   Filed Weights    .Body mass index is 26.96 kg/m. . . GENERAL:alert, repeating same sentence again and again and not oriented. SKIN: no acute rashes EYES: conjunctiva are pink and non-injected, sclera anicteric OROPHARYNX: MMM LYMPH:  no palpable lymphadenopathy in the cervical, axillary regions LUNGS: clear to auscultation b/l with normal respiratory effort HEART: regular rate & rhythm ABDOMEN:  normoactive bowel sounds , non tender, not distended. Extremity: 1+ bilateral pedal edema PSYCH/neuro: alert and confused not oriented to time place.  Oriented to his daughter.   Hyperactive.     LABORATORY DATA:  I have reviewed the data as listed  . CBC Latest Ref Rng & Units 11/23/2021 11/22/2021 11/20/2021  WBC 4.0 - 10.5 K/uL 6.3 5.9 4.6  Hemoglobin 13.0 - 17.0 g/dL 12.1(L) 12.7(L) 12.3(L)  Hematocrit 39.0 - 52.0 % 37.2(L) 38.0(L) 37.6(L)  Platelets 150 - 400 K/uL 145(L) 152 142(L)    . CMP Latest Ref Rng & Units 11/23/2021 11/22/2021 11/21/2021  Glucose 70 - 99 mg/dL 103(H) 104(H) 87  BUN 8 - 23 mg/dL _0 Creatinine 0.61 - 1.24 mg/dL 0.84 0.61 0.75  Sodium 135 - 145 mmol/L 137 135 139  Potassium 3.5 - 5.1 mmol/L 3.8 3.3(L) 3.4(L)  Chloride 98 -  111 mmol/L 107 108 106  CO2 22 - 32 mmol/L _0 Calcium 8.9 - 10.3 mg/dL 8.4(L) 8.2(L) 8.2(L)  Total Protein 6.5 - 8.1 g/dL 4.8(L) 5.1(L) 5.2(L)  Total Bilirubin 0.3 - 1.2 mg/dL 1.3(H) 1.6(H) 1.3(H)  Alkaline Phos 38 - 126 U/L 84 91 97  AST 15 - 41 U/L _1 ALT 0 - 44 U/L _2 Component     Latest Ref Rng & Units 11/19/2021  WBC     4.0 - 10.5 K/uL 5.0  RBC     4.22 - 5.81 MIL/uL 4.49  Hemoglobin     13.0 - 17.0 g/dL 13.0  HCT     39.0 - 52.0 % 40.1  MCV     80.0 - 100.0 fL 89.3  MCH     26.0 - 34.0 pg 29.0  MCHC     30.0 - 36.0 g/dL 32.4  RDW     11.5 - 15.5 % 17.9 (H)  Platelets     150 - 400 K/uL 163  nRBC     0.0 - 0.2 % 0.0  Neutrophils     % 72  NEUT#     1.7 - 7.7 K/uL 3.6  Lymphocytes     % 15  Lymphocyte #     0.7 - 4.0 K/uL 0.7  Monocytes Relative     % 8  Monocyte #     0.1 - 1.0 K/uL 0.4  Eosinophil     % 1  Eosinophils Absolute     0.0 - 0.5 K/uL 0.1  Basophil     % 0  Basophils Absolute     0.0 - 0.1 K/uL 0.0  Immature Granulocytes     % 4  Abs Immature Granulocytes     0.00 - 0.07 K/uL 0.19 (H)  Sodium     135 - 145 mmol/L 141  Potassium     3.5 - 5.1 mmol/L 3.8  Chloride     98 - 111 mmol/L 108  CO2     22 - 32 mmol/L 23  Glucose     70 - 99 mg/dL 106 (H)  BUN     8 - 23 mg/dL 18  Creatinine     0.61 - 1.24 mg/dL 0.83   Calcium     8.9 - 10.3 mg/dL 8.7 (L)  Total Protein     6.5 - 8.1 g/dL 5.6 (L)  Albumin     3.5 - 5.0 g/dL 2.7 (L)  AST     15 - 41 U/L 16  ALT     0 - 44 U/L 6  Alkaline Phosphatase     38 - 126 U/L 118  Total Bilirubin     0.3 - 1.2 mg/dL 0.9  GFR, Est Non African American     >60 mL/min >60  Anion gap     5 - 15 10  IgG (Immunoglobin G), Serum     603 - 1,613 mg/dL 936  IgA     61 - 437 mg/dL 139  IgM (Immunoglobulin M), Srm     15 - 143 mg/dL 64  Total Protein ELP     6.0 - 8.5 g/dL 5.0 (L)  Albumin SerPl Elph-Mcnc     2.9 - 4.4 g/dL 2.6 (L)  Alpha 1     0.0 - 0.4 g/dL 0.2  Alpha2 Glob SerPl Elph-Mcnc     0.4 - 1.0 g/dL 0.7  B-Globulin SerPl  Elph-Mcnc     0.7 - 1.3 g/dL 0.7  Gamma Glob SerPl Elph-Mcnc     0.4 - 1.8 g/dL 0.8  M Protein SerPl Elph-Mcnc     Not Observed g/dL Not Observed  Globulin, Total     2.2 - 3.9 g/dL 2.4  Albumin/Glob SerPl     0.7 - 1.7 1.1  IFE 1      Comment  Please Note (HCV):      Comment  Kappa free light chain     3.3 - 19.4 mg/L 44.7 (H)  Lambda free light chains     5.7 - 26.3 mg/L 26.4 (H)  Kappa, lambda light chain ratio     0.26 - 1.65 1.69 (H)  LDH     98 - 192 U/L 247 (H)    SURGICAL PATHOLOGY  CASE: [id number]  PATIENT: Madaline Guthrie  Surgical Pathology Report      Clinical History: Painful right pelvic bone lesion at juncture of iliac  bone and ischium. Elevated free kappa light chains. 5 cm right  iliac/ischial expansile lytic bone lesion. Possible plasmacytoma vs  metastatic lesion vs benign lytic lesion. (crm)      FINAL MICROSCOPIC DIAGNOSIS:  A. BONE, RIGHT PELVIS, ILIAC/ISCHIAL LYTIC, BIOPSY:  - Metastatic adenocarcinoma consistent with lung primary.  - See comment.    RADIOGRAPHIC STUDIES: I have personally reviewed the radiological images as listed and agreed with the findings in the report. CT HEAD W & WO CONTRAST (5MM)  Result Date: 11/19/2021 CLINICAL DATA:  Metastatic disease  evaluation EXAM: CT HEAD WITHOUT AND WITH CONTRAST TECHNIQUE: Contiguous axial images were obtained from the base of the skull through the vertex without and with intravenous contrast CONTRAST:  149m OMNIPAQUE IOHEXOL 350 MG/ML SOLN COMPARISON:  Brain MRI 09/24/2021 FINDINGS: Brain: Unchanged 9 mm meningioma of the right middle cranial fossa. The enhancing metastases shown on the brain MRI of 09/24/2021 are not visible on this study. There is generalized atrophy without lobar predilection. Hypodensity of the white matter is most commonly associated with chronic microvascular disease. Vascular: No abnormal hyperdensity of the major intracranial arteries or dural venous sinuses. No intracranial atherosclerosis. Skull: The visualized skull base, calvarium and extracranial soft tissues are normal. Sinuses/Orbits: No fluid levels or advanced mucosal thickening of the visualized paranasal sinuses. No mastoid or middle ear effusion. The orbits are normal. IMPRESSION: Unchanged 9 mm right middle cranial fossa meningioma. The enhancing metastases shown on the brain MRI of 09/24/2021 are not visible on this study. Electronically Signed   By: KUlyses JarredM.D.   On: 11/19/2021 21:42   CT LUMBAR SPINE W CONTRAST  Result Date: 11/19/2021 CLINICAL DATA:  Metastatic disease to the right hemipelvis. Follow-up study. EXAM: CT LUMBAR SPINE WITH CONTRAST TECHNIQUE: Multidetector CT imaging of the lumbar spine was performed with intravenous contrast administration. CONTRAST:  1070mOMNIPAQUE IOHEXOL 350 MG/ML SOLN COMPARISON:  CT chest, abdomen and pelvis 09/03/2021. FINDINGS: Segmentation: 5 lumbar type vertebrae. Alignment: There is mild lumbar dextroscoliosis apex at L2 and again noted minimal grade 1 retrolisthesis at L3-4. No new or worsening alignment abnormality is seen. Vertebrae: There is osteopenia. No acute spinal compression fracture or destructive lumbar bone lesion is seen. There is mild chronic wedging of the L1  vertebral body. There is moderate marginal osteophytosis at the upper 3 lumbar levels, less prominent marginal osteophytes at the lower 2 levels. Paraspinal and other soft tissues: There is aortoiliac atherosclerosis and abdominal aortic tortuosity without AAA. No paraspinal mass is  seen. A small left renal cyst is again noted. There is no left renal calculus or hydronephrosis with the right kidney not included in the imaging. Disc levels: T11-12 and T12-L1: Not well seen due to artifact from the patient's arms in the field but grossly unremarkable. L1-2: There is a normal disc height. No bulge, herniation or stenosis is seen. L2-3: No bulge, herniation or stenosis. L3-4: Stable grade 1 retrolisthesis. There is preservation of the normal disc height. Due to a posterior disc bulge and ligamentous thickening there is mild spinal canal and lateral recess stenosis. Due to facet spurring there is mild-to-moderate right foraminal stenosis. The left foramen is clear. L4-5: There is preservation of the normal disc height. Broad posterior disc bulge causes slight spinal canal AP stenosis and lateral recess stenosis. Right greater than left facet hypertrophy is present and there is moderate right, mild left foraminal stenosis. L5-S1: There is slight disc space loss with vacuum phenomenon. There is a nonstenosing posterior disc bulge without herniation. Right greater than left facet hypertrophy again noted with moderate to severe right, mild left foraminal stenosis. Other: Expansile destructive lesion of the posterior right iliac wing abutting the SI joint interface is again shown. It is not fully imaged but the visualized portion is larger than previously. A pathologic hairline fracture of the anterior aspect of the lesion is present on the lowest slices. Maximum measured AP and transverse axis on the current exam are 4.2 x 6.4 cm, on prior scan 3.6 x 6.1 cm. IMPRESSION: 1. 4.2 x 6.4 cm destructive expansile lesion of the  posterior right iliac wing is slightly larger than on the 09/03/2021 CT. There is pathologic fracture through the anterior cortex. 2. MRI of 08/03/2021 demonstrated 2 small foci of signal abnormality in the L2 and 3 vertebral bodies, but these are not visible on noncontrast CT. 3. Osteopenia, scoliosis and degenerative changes described above, stable. 4. Aortic atherosclerosis. Electronically Signed   By: Telford Nab M.D.   On: 11/19/2021 22:08   DG CHEST PORT 1 VIEW  Result Date: 11/19/2021 CLINICAL DATA:  Encephalopathy EXAM: PORTABLE CHEST 1 VIEW COMPARISON:  None. FINDINGS: The heart size and mediastinal contours are within normal limits. Both lungs are clear. The visualized skeletal structures are unremarkable. IMPRESSION: No active disease. Electronically Signed   By: Ulyses Jarred M.D.   On: 11/19/2021 19:48   DG Foot Complete Right  Result Date: 11/19/2021 CLINICAL DATA:  Recent fall EXAM: RIGHT FOOT COMPLETE - 3+ VIEW COMPARISON:  None. FINDINGS: There is no evidence of fracture or dislocation. There is no evidence of arthropathy or other focal bone abnormality. Soft tissues are unremarkable. IMPRESSION: Negative. Electronically Signed   By: Ulyses Jarred M.D.   On: 11/19/2021 19:49   EEG adult  Result Date: 11/22/2021 Lora Havens, MD     11/22/2021  9:30 PM Patient Name: Marcus Fuller MRN: [id number] Epilepsy Attending: Lora Havens Referring Physician/Provider: Elodia Florence., MD Date: 11/22/2021 Duration: 22.30 mins Patient history: 80yo M with ams. EEG to evaluate for seizure Level of alertness: Awake AEDs during EEG study: LEV Technical aspects: This EEG study was done with scalp electrodes positioned according to the 10-20 International system of electrode placement. Electrical activity was acquired at a sampling rate of _0  and reviewed with a high frequency filter of _1  and a low frequency filter of _2 . EEG data were recorded continuously and digitally stored.  Description: EEG showed continuous generalized 3 to 6 Hz theta-delta slowing.  Hyperventilation and photic stimulation were not performed.   Patient was noted to have right upper extremity shaking on 11/22/2021 at 1608. Concomitant eeg before, during and after the event didnw show any eeg change to suggest seizure. ABNORMALITY - Continuous slow, generalized IMPRESSION: This study is suggestive of moderate diffuse encephalopathy, nonspecific etiology. No seizures or epileptiform discharges were seen throughout the recording. Patient was noted to have right upper extremity shaking on 11/22/2021 at 1608 without concomitant eeg change. This was most likely NOT and epileptic event. Priyanka O Yadav   VAS [name] ABI WITH/WO TBI  Result Date: 11/23/2021  LOWER EXTREMITY DOPPLER STUDY Patient Name:  EVELYN MOCH  Date of Exam:   11/23/2021 Medical Rec #: [id number]        Accession #:    [id number] Date of Birth: Dec 19, 1941       Patient Gender: M Patient Age:   51 years Exam Location:  [facility] Procedure:      VAS [name] ABI WITH/WO TBI Referring Phys: CAROLE HALL --------------------------------------------------------------------------------  Indications: Blue toes. High Risk Factors: Hyperlipidemia.  Comparison Study: No prior studies. Performing Technologist: Carlos Levering RVT  Examination Guidelines: A complete evaluation includes at minimum, Doppler waveform signals and systolic blood pressure reading at the level of bilateral brachial, anterior tibial, and posterior tibial arteries, when vessel segments are accessible. Bilateral testing is considered an integral part of a complete examination. Photoelectric Plethysmograph (PPG) waveforms and toe systolic pressure readings are included as required and additional duplex testing as needed. Limited examinations for reoccurring indications may be performed as noted.  ABI Findings: +---------+------------------+-----+----------+--------+ Right    Rt Pressure  (mmHg)IndexWaveform  Comment  +---------+------------------+-----+----------+--------+ Brachial 96                     triphasic          +---------+------------------+-----+----------+--------+ PTA      97                1.01 biphasic           +---------+------------------+-----+----------+--------+ DP       68                0.71 monophasic         +---------+------------------+-----+----------+--------+ Great Toe38                0.40                    +---------+------------------+-----+----------+--------+ +---------+------------------+-----+---------+-------+ Left     Lt Pressure (mmHg)IndexWaveform Comment +---------+------------------+-----+---------+-------+ Brachial 82                     triphasic        +---------+------------------+-----+---------+-------+ PTA      120               1.25 triphasic        +---------+------------------+-----+---------+-------+ DP       103               1.07 triphasic        +---------+------------------+-----+---------+-------+ Great Toe72                0.75                  +---------+------------------+-----+---------+-------+ +-------+-----------+-----------+------------+------------+ ABI/TBIToday's ABIToday's TBIPrevious ABIPrevious TBI +-------+-----------+-----------+------------+------------+ Right  1.01       0.4                                 +-------+-----------+-----------+------------+------------+  Left   1.25       0.75                                +-------+-----------+-----------+------------+------------+  Summary: Right: Resting right ankle-brachial index is within normal range. No evidence of significant right lower extremity arterial disease. The right toe-brachial index is abnormal. Left: Resting left ankle-brachial index is within normal range. No evidence of significant left lower extremity arterial disease. The left toe-brachial index is normal.  *See table(s) above  for measurements and observations.  Electronically signed by Deitra Mayo MD on 11/23/2021 at 6:32:22 PM.    Final     ASSESSMENT & PLAN:   80 year old male with history of Parkinson's disease, coronary artery disease, atrial fibrillation on anticoagulation with  1) Newly diagnosed metastatic lung adenocarcinoma with bone mets and brain metastases in the right frontal lobe EGFR mutation positive PD-L1 70% positive Neoplasm related pain is in his right posterior hip and upper thigh PET CT scan from 09/10/2021 showed Hypermetabolic solid nodule of the right lung apex measuring 12 mm compatible with primary lung malignancy.Hypermetabolic lytic lesions of the right ilium concerning for osseous metastatic disease.  Hypermetabolic focus which correlates with the right C2-C3 facet with no lytic lesion visualized, possibly due to degenerative disease.  MRI of the brain with and without contrast on 09/13/2021 which showed Three metastatic deposits in the brain in the right frontal lobe. Mild associated edema. 9 mm meningioma right middle cranial fossa. Atrophy and chronic microvascular ischemic change.  2) L2 and L3 lesions concerning for bone metastases. 3) history of Parkinson's disease on Sinemet follows with Dr. Margette Fast 4) coronary artery disease status post PCI 2007 atrial fibrillation status post pacemaker placement on anticoagulation and digoxin follows with Dr. Sallyanne Kuster 5) hypertension 6) dyslipidemia 7) altered mental status with hyperactive delirium Likely multifactorial -brain mets with radiation, Parkinson's disease, recent use of steroids, pain medications. Rule out infection, CVA or radiation necrosis with increased brain edema. PLAN -I discussed labs with the patient and his daughter. -Patient is having hyperactive delirium and is unable to function at home and his family is unable to take care of him at home and he has had several falls. -After detailed goals of care  discussion addition was made to directly admit the patient to the hospitalist service for evaluation of reversible causes of delirium. -If his altered mental status or delirium cannot be corrected both our oncology service as well as palliative care will follow up continued goals of care discussion and consideration of transfer to regional in keeping with the patient's family's considerations. -We will hold Tagrisso and Zometa at this time pending more clarity on his functional status and the reversibility of his delirium.  Follow-up  Admit to hospitalist service  . The total time spent in the appointment was 32 minutes in reviewing the patient's complex neurological status, treatment status, discussion of goals of care, arranging Admission and coordinating this with hospital medicine, coordination of care with palliative care.    Sullivan Lone MD MS AAHIVMS [facility] Hematology/Oncology Physician [facility]      .Marland Kitchen

## 2021-11-30 ENCOUNTER — Telehealth: Payer: Self-pay | Admitting: Neurology

## 2021-11-30 NOTE — Telephone Encounter (Signed)
Sent patient message that appointment was rescheduled due to Dr. Rexene Alberts being out. Added to wait list.

## 2021-12-01 ENCOUNTER — Telehealth: Payer: Self-pay | Admitting: Neurology

## 2021-12-01 NOTE — Telephone Encounter (Signed)
So sorry to hear of this. Will mail a condolence card.

## 2021-12-01 NOTE — Telephone Encounter (Signed)
FYI: we reached out to patient to r/s his upcoming 12/21 appt [address] Dr. Rexene Alberts and were informed by his wife that the patient is deceased.

## 2021-12-01 NOTE — Telephone Encounter (Signed)
I am sorry to hear this.

## 2021-12-08 ENCOUNTER — Other Ambulatory Visit: Payer: Self-pay | Admitting: Radiation Therapy

## 2021-12-08 ENCOUNTER — Ambulatory Visit: Payer: Medicare Other | Admitting: Neurology

## 2021-12-08 DIAGNOSIS — C7931 Secondary malignant neoplasm of brain: Secondary | ICD-10-CM

## 2021-12-19 DEATH — deceased

## 2022-01-10 ENCOUNTER — Ambulatory Visit: Payer: Medicare Other | Admitting: Internal Medicine

## 2022-01-24 ENCOUNTER — Ambulatory Visit: Payer: Medicare Other | Admitting: Neurology

## 2022-01-27 ENCOUNTER — Other Ambulatory Visit: Payer: Self-pay

## 2022-02-07 ENCOUNTER — Ambulatory Visit (HOSPITAL_COMMUNITY): Payer: Medicare Other
